# Patient Record
Sex: Male | Born: 1940 | Race: White | Hispanic: No | Marital: Married | Smoking: Never smoker
Health system: Southern US, Community
[De-identification: ages and names within clinical notes are randomized; demographics above are authoritative.]

## PROBLEM LIST (undated history)

## (undated) DIAGNOSIS — N400 Enlarged prostate without lower urinary tract symptoms: Secondary | ICD-10-CM

## (undated) DIAGNOSIS — R739 Hyperglycemia, unspecified: Secondary | ICD-10-CM

## (undated) DIAGNOSIS — K7290 Hepatic failure, unspecified without coma: Secondary | ICD-10-CM

## (undated) DIAGNOSIS — D126 Benign neoplasm of colon, unspecified: Secondary | ICD-10-CM

## (undated) DIAGNOSIS — I219 Acute myocardial infarction, unspecified: Secondary | ICD-10-CM

## (undated) DIAGNOSIS — I2510 Atherosclerotic heart disease of native coronary artery without angina pectoris: Secondary | ICD-10-CM

## (undated) DIAGNOSIS — E7800 Pure hypercholesterolemia, unspecified: Secondary | ICD-10-CM

## (undated) DIAGNOSIS — K7460 Unspecified cirrhosis of liver: Secondary | ICD-10-CM

## (undated) DIAGNOSIS — K7682 Hepatic encephalopathy: Secondary | ICD-10-CM

## (undated) DIAGNOSIS — I10 Essential (primary) hypertension: Secondary | ICD-10-CM

## (undated) DIAGNOSIS — K648 Other hemorrhoids: Secondary | ICD-10-CM

## (undated) DIAGNOSIS — S8290XA Unspecified fracture of unspecified lower leg, initial encounter for closed fracture: Secondary | ICD-10-CM

## (undated) DIAGNOSIS — C9000 Multiple myeloma not having achieved remission: Secondary | ICD-10-CM

## (undated) DIAGNOSIS — E119 Type 2 diabetes mellitus without complications: Secondary | ICD-10-CM

## (undated) DIAGNOSIS — I8500 Esophageal varices without bleeding: Secondary | ICD-10-CM

## (undated) DIAGNOSIS — S42009A Fracture of unspecified part of unspecified clavicle, initial encounter for closed fracture: Secondary | ICD-10-CM

## (undated) DIAGNOSIS — D649 Anemia, unspecified: Secondary | ICD-10-CM

## (undated) HISTORY — DX: Esophageal varices without bleeding: I85.00

## (undated) HISTORY — DX: Acute myocardial infarction, unspecified: I21.9

## (undated) HISTORY — DX: Pure hypercholesterolemia, unspecified: E78.00

## (undated) HISTORY — DX: Multiple myeloma not having achieved remission: C90.00

## (undated) HISTORY — DX: Atherosclerotic heart disease of native coronary artery without angina pectoris: I25.10

## (undated) HISTORY — DX: Essential (primary) hypertension: I10

## (undated) HISTORY — DX: Hepatic encephalopathy: K76.82

## (undated) HISTORY — DX: Fracture of unspecified part of unspecified clavicle, initial encounter for closed fracture: S42.009A

## (undated) HISTORY — DX: Type 2 diabetes mellitus without complications: E11.9

## (undated) HISTORY — DX: Unspecified fracture of unspecified lower leg, initial encounter for closed fracture: S82.90XA

## (undated) HISTORY — DX: Hepatic failure, unspecified without coma: K72.90

## (undated) HISTORY — DX: Anemia, unspecified: D64.9

## (undated) HISTORY — DX: Other hemorrhoids: K64.8

## (undated) HISTORY — DX: Benign neoplasm of colon, unspecified: D12.6

## (undated) HISTORY — PX: CORONARY ANGIOPLASTY WITH STENT PLACEMENT: SHX49

## (undated) HISTORY — PX: TONSILLECTOMY: SUR1361

## (undated) HISTORY — DX: Benign prostatic hyperplasia without lower urinary tract symptoms: N40.0

---

## 2005-03-17 LAB — HM COLONOSCOPY

## 2005-03-21 ENCOUNTER — Ambulatory Visit: Payer: Self-pay | Admitting: Gastroenterology

## 2005-08-16 ENCOUNTER — Inpatient Hospital Stay: Payer: Self-pay | Admitting: Internal Medicine

## 2005-08-16 ENCOUNTER — Other Ambulatory Visit: Payer: Self-pay

## 2005-08-17 ENCOUNTER — Other Ambulatory Visit: Payer: Self-pay

## 2006-05-01 ENCOUNTER — Emergency Department: Payer: Self-pay | Admitting: Emergency Medicine

## 2006-05-01 ENCOUNTER — Other Ambulatory Visit: Payer: Self-pay

## 2006-07-09 ENCOUNTER — Ambulatory Visit: Payer: Self-pay | Admitting: Internal Medicine

## 2010-09-08 ENCOUNTER — Observation Stay: Payer: Self-pay | Admitting: Internal Medicine

## 2011-07-11 LAB — IFOBT (OCCULT BLOOD): IFOBT: NEGATIVE

## 2011-12-11 ENCOUNTER — Telehealth: Payer: Self-pay | Admitting: Internal Medicine

## 2011-12-11 NOTE — Telephone Encounter (Signed)
Pt is needing refill on Diabetes meds. He uses [facility].

## 2011-12-12 ENCOUNTER — Telehealth: Payer: Self-pay | Admitting: Internal Medicine

## 2011-12-12 NOTE — Telephone Encounter (Signed)
Need to know what meds he needs.  He has not been seen here yet, but he does have an upcoming appt.  Ok to refill until appt, but need to know what he is taking.  Thanks

## 2011-12-12 NOTE — Telephone Encounter (Signed)
Patient wanting a refill on his Metformin. I don't see this drug on his medication list that was faxed from [place].

## 2011-12-12 NOTE — Telephone Encounter (Signed)
Called pt at home and left message with to returned call

## 2011-12-12 NOTE — Telephone Encounter (Signed)
Spoke to patients spouse who stated that he takes metformin. Pt had list of meds sent over from [facility], however there is no evidence of him taking metformin.

## 2011-12-13 ENCOUNTER — Telehealth: Payer: Self-pay | Admitting: *Deleted

## 2011-12-13 NOTE — Telephone Encounter (Signed)
I called and left a message for pt to call.  I called [facility].  The last time he got metformin filled at [facility] was 2010.  Need to know if he has been taking regularly.  If not, when did he start back taking.  If taking, where and who is filling.  Just need to clarify a few things before filling.  Thanks.

## 2011-12-13 NOTE — Telephone Encounter (Signed)
Spoke to patients wife who stated that Zachary Buck has not had a perscription since 2010 when you saw him last at [facility]

## 2011-12-13 NOTE — Telephone Encounter (Signed)
Patient returned call, he stated that he does not know what mg rx he takes. Patient stated that his will call us and let us know.

## 2011-12-15 ENCOUNTER — Other Ambulatory Visit: Payer: Self-pay | Admitting: Internal Medicine

## 2011-12-15 DIAGNOSIS — E119 Type 2 diabetes mellitus without complications: Secondary | ICD-10-CM

## 2011-12-15 DIAGNOSIS — E7800 Pure hypercholesterolemia, unspecified: Secondary | ICD-10-CM

## 2011-12-20 ENCOUNTER — Other Ambulatory Visit: Payer: Self-pay | Admitting: Internal Medicine

## 2011-12-20 ENCOUNTER — Other Ambulatory Visit (INDEPENDENT_AMBULATORY_CARE_PROVIDER_SITE_OTHER): Payer: No Typology Code available for payment source

## 2011-12-20 ENCOUNTER — Ambulatory Visit (INDEPENDENT_AMBULATORY_CARE_PROVIDER_SITE_OTHER): Payer: No Typology Code available for payment source | Admitting: Internal Medicine

## 2011-12-20 DIAGNOSIS — E7800 Pure hypercholesterolemia, unspecified: Secondary | ICD-10-CM

## 2011-12-20 DIAGNOSIS — Z23 Encounter for immunization: Secondary | ICD-10-CM

## 2011-12-20 DIAGNOSIS — N289 Disorder of kidney and ureter, unspecified: Secondary | ICD-10-CM

## 2011-12-20 DIAGNOSIS — E119 Type 2 diabetes mellitus without complications: Secondary | ICD-10-CM

## 2011-12-20 LAB — BASIC METABOLIC PANEL
BUN: 27 mg/dL — ABNORMAL HIGH (ref 6–23)
Chloride: 105 mEq/L (ref 96–112)
Creatinine, Ser: 1.5 mg/dL (ref 0.4–1.5)
GFR: 49.68 mL/min — ABNORMAL LOW (ref >60.00)
Glucose, Bld: 162 mg/dL — ABNORMAL HIGH (ref 70–99)
Potassium: 4.3 mEq/L (ref 3.5–5.1)

## 2011-12-20 LAB — HEPATIC FUNCTION PANEL
Alkaline Phosphatase: 37 U/L — ABNORMAL LOW (ref 39–117)
Bilirubin, Direct: 0 mg/dL (ref 0.0–0.3)
Total Bilirubin: 0.8 mg/dL (ref 0.3–1.2)
Total Protein: 7.7 g/dL (ref 6.0–8.3)

## 2011-12-20 LAB — LIPID PANEL
HDL: 26.8 mg/dL — ABNORMAL LOW (ref >39.00)
VLDL: 45 mg/dL — ABNORMAL HIGH (ref 0.0–40.0)

## 2011-12-20 LAB — HEMOGLOBIN A1C: Hgb A1c MFr Bld: 6.9 % — ABNORMAL HIGH (ref 4.6–6.5)

## 2011-12-20 NOTE — Telephone Encounter (Signed)
Dr. Lorin Picket got in touch with patient.

## 2011-12-22 ENCOUNTER — Telehealth: Payer: Self-pay | Admitting: *Deleted

## 2011-12-22 NOTE — Telephone Encounter (Signed)
Left message for patient to return call.

## 2011-12-25 ENCOUNTER — Encounter: Payer: Self-pay | Admitting: *Deleted

## 2011-12-25 ENCOUNTER — Telehealth: Payer: Self-pay | Admitting: *Deleted

## 2011-12-25 NOTE — Telephone Encounter (Signed)
Since his level was ok (just slightly increased) - I am going to hold off on restarting Metformin at this time.  Will need to follow sugars.

## 2011-12-25 NOTE — Telephone Encounter (Signed)
Patients wife found his old med bottle for met and she said he was on 500 mg. She wanted to know if you were going to start him on this med again?

## 2011-12-25 NOTE — Progress Notes (Signed)
Mailed lab results to patient per his request.

## 2011-12-26 NOTE — Telephone Encounter (Signed)
Spoke with patients wife again and informed her to let her husband know that he is to keep a check on his sugar. Patient is to see Dr Lorin Picket 03/20/12.

## 2011-12-26 NOTE — Telephone Encounter (Signed)
Left message for patient to return call.

## 2011-12-27 NOTE — Telephone Encounter (Signed)
Already spoken to patient, who called back.

## 2012-03-19 ENCOUNTER — Encounter: Payer: Self-pay | Admitting: *Deleted

## 2012-03-20 ENCOUNTER — Ambulatory Visit: Payer: Self-pay | Admitting: Internal Medicine

## 2012-05-21 DIAGNOSIS — D649 Anemia, unspecified: Secondary | ICD-10-CM | POA: Insufficient documentation

## 2012-05-21 DIAGNOSIS — E785 Hyperlipidemia, unspecified: Secondary | ICD-10-CM

## 2012-05-21 DIAGNOSIS — I10 Essential (primary) hypertension: Secondary | ICD-10-CM | POA: Insufficient documentation

## 2012-05-21 DIAGNOSIS — I495 Sick sinus syndrome: Secondary | ICD-10-CM

## 2012-05-21 DIAGNOSIS — E7800 Pure hypercholesterolemia, unspecified: Secondary | ICD-10-CM | POA: Insufficient documentation

## 2012-05-21 DIAGNOSIS — Z86010 Personal history of colonic polyps: Secondary | ICD-10-CM | POA: Insufficient documentation

## 2012-05-21 DIAGNOSIS — I2510 Atherosclerotic heart disease of native coronary artery without angina pectoris: Secondary | ICD-10-CM | POA: Insufficient documentation

## 2012-05-29 ENCOUNTER — Encounter: Payer: Self-pay | Admitting: Internal Medicine

## 2012-05-29 ENCOUNTER — Ambulatory Visit (INDEPENDENT_AMBULATORY_CARE_PROVIDER_SITE_OTHER): Payer: Medicare Other | Admitting: Internal Medicine

## 2012-05-29 VITALS — BP 130/72 | HR 68 | Temp 98.0°F | Resp 18 | Ht 69.0 in | Wt 207.5 lb

## 2012-05-29 DIAGNOSIS — E119 Type 2 diabetes mellitus without complications: Secondary | ICD-10-CM | POA: Insufficient documentation

## 2012-05-29 DIAGNOSIS — E7800 Pure hypercholesterolemia, unspecified: Secondary | ICD-10-CM

## 2012-05-29 DIAGNOSIS — I10 Essential (primary) hypertension: Secondary | ICD-10-CM

## 2012-05-29 DIAGNOSIS — Z860100 Personal history of colon polyps, unspecified: Secondary | ICD-10-CM

## 2012-05-29 DIAGNOSIS — I2510 Atherosclerotic heart disease of native coronary artery without angina pectoris: Secondary | ICD-10-CM

## 2012-05-29 DIAGNOSIS — D649 Anemia, unspecified: Secondary | ICD-10-CM

## 2012-05-29 LAB — BASIC METABOLIC PANEL
CO2: 24 mEq/L (ref 19–32)
Calcium: 9.3 mg/dL (ref 8.4–10.5)
Creatinine, Ser: 1.1 mg/dL (ref 0.4–1.5)
Glucose, Bld: 160 mg/dL — ABNORMAL HIGH (ref 70–99)

## 2012-05-29 LAB — HEPATIC FUNCTION PANEL
ALT: 21 U/L (ref 0–53)
AST: 21 U/L (ref 0–37)
Alkaline Phosphatase: 38 U/L — ABNORMAL LOW (ref 39–117)
Bilirubin, Direct: 0.1 mg/dL (ref 0.0–0.3)
Total Bilirubin: 0.7 mg/dL (ref 0.3–1.2)

## 2012-05-29 LAB — LIPID PANEL: Triglycerides: 323 mg/dL — ABNORMAL HIGH (ref 0.0–149.0)

## 2012-05-29 LAB — CBC WITH DIFFERENTIAL/PLATELET
Basophils Absolute: 0.1 10*3/uL (ref 0.0–0.1)
Eosinophils Relative: 3.5 % (ref 0.0–5.0)
HCT: 41.4 % (ref 39.0–52.0)
Lymphocytes Relative: 26.5 % (ref 12.0–46.0)
Monocytes Relative: 9.4 % (ref 3.0–12.0)
Neutrophils Relative %: 59.8 % (ref 43.0–77.0)
Platelets: 263 10*3/uL (ref 150.0–400.0)
RDW: 13.9 % (ref 11.5–14.6)
WBC: 6.6 10*3/uL (ref 4.5–10.5)

## 2012-05-29 LAB — FERRITIN: Ferritin: 62.4 ng/mL (ref 22.0–322.0)

## 2012-05-29 LAB — LDL CHOLESTEROL, DIRECT: Direct LDL: 95.3 mg/dL

## 2012-06-02 ENCOUNTER — Encounter: Payer: Self-pay | Admitting: Internal Medicine

## 2012-06-02 NOTE — Assessment & Plan Note (Signed)
Blood pressure is doing well.  Same med regimen. Follow.  Check metabolic panel.

## 2012-06-02 NOTE — Assessment & Plan Note (Signed)
Not checking sugars.  Stressed to him the importance of eating regular meals, monitoring his diet, exercise and checking sugars.  Check metabolic panel and a1c.  Follow.

## 2012-06-02 NOTE — Assessment & Plan Note (Signed)
On previous check hgb slightly decreased.  Iron studies wnl.  Follow.    

## 2012-06-02 NOTE — Progress Notes (Signed)
Subjective:    Patient ID: Zachary Buck, male    DOB: [birth year], 72 y.o.   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  He comes in today to follow up on these issues and to transfer his care to [facility].  I previously saw him at [place].  States he is doing well.  Not checking his sugars. Tries to stay active.  Not doing regular exercise.  No chest pain or tightness with increased activity or exertion.  Breathing stable.  Bowels stable.    Past Medical History  Diagnosis Date  . Hypercholesterolemia   . Atherosclerotic heart disease     s/p MI, s/p stent mid circumflex  . Hypertension   . Diabetes mellitus without complication   . Clavicle fracture   . Leg fracture     Current Outpatient Prescriptions on File Prior to Visit  Medication Sig Dispense Refill  . aspirin 81 MG tablet Take 81 mg by mouth daily.      . Cholecalciferol (VITAMIN D-3) 1000 UNITS CAPS Take by mouth.      . Coenzyme Q10 (COQ-10) 100 MG CAPS Take by mouth daily.      Marland Kitchen glucose blood test strip 1 each by Other route as needed. Use as instructed      . levocarnitine (CARNITOR) 250 MG capsule Take 250 mg by mouth daily.      Marland Kitchen lisinopril (PRINIVIL,ZESTRIL) 20 MG tablet Take 20 mg by mouth daily.      . Omega-3 Fatty Acids (FISH OIL) 1000 MG CAPS Take 3 capsules by mouth daily.      . rosuvastatin (CRESTOR) 20 MG tablet Take 20 mg by mouth daily.      . vitamin E (VITAMIN E) 400 UNIT capsule Take 400 Units by mouth daily.      . vitamin k 100 MCG tablet Take 100 mcg by mouth daily.       No current facility-administered medications on file prior to visit.    Review of Systems Patient denies any headache, lightheadedness or dizziness. No sinus or allergy symptoms.   No chest pain, tightness or palpitations.  No increased shortness of breath, cough or congestion.  No acid reflux.  No nausea or  vomiting.  No abdominal pain or cramping.  No bowel change, such as diarrhea, constipation, BRBPR or melana.  No urine change.  Not checking his sugars.  Not watching what he eats.  Feels good.       Objective:   Physical Exam Filed Vitals:   05/29/12 1028  BP: 130/72  Pulse: 68  Temp: 98 F (36.7 C)  Resp: 78   72 year old male in no acute distress.  HEENT:  Nares - clear.  Oropharynx - without lesions. NECK:  Supple.  Nontender.  No audible carotid bruit.  HEART:  Appears to be regular.   LUNGS:  No crackles or wheezing audible.  Respirations even and unlabored.   RADIAL PULSE:  Equal bilaterally.  ABDOMEN:  Soft.  Nontender.  Bowel sounds present and normal.  No audible abdominal bruit.    EXTREMITIES:  No increased edema present.  DP pulses palpable and equal bilaterally.         Assessment & Plan:  GI.  Has had no further bleeding.  Was referred to GI.  Need to obtain records if evaluated.   Follow.   HEALTH MAINTENANCE.  Schedule  a physical for next visit.  Prostate checks and PSAs through Dr Achilles Dunk.  GI as outlined.

## 2012-06-02 NOTE — Assessment & Plan Note (Signed)
Currently doing well.  Sees Dr Gwen Pounds.  Discussed need for aggressive risk factor modification.

## 2012-06-02 NOTE — Assessment & Plan Note (Signed)
On Crestor.  Low cholesterol diet and exercise.  Check lipid panel and liver function.   

## 2012-06-02 NOTE — Assessment & Plan Note (Signed)
Last colonoscopy that I have results - 2007.  Hyperplastic polyp.  Obtain more recent records.  No further bleeding.

## 2012-06-03 ENCOUNTER — Telehealth: Payer: Self-pay | Admitting: Internal Medicine

## 2012-06-03 ENCOUNTER — Encounter: Payer: Self-pay | Admitting: Internal Medicine

## 2012-06-03 NOTE — Telephone Encounter (Signed)
Please see pts my chart message.  I don't understand the message.  What does he need a prescription for.  I had left a lab result note - regarding him needing to come in to discuss treatment for his sugars.  His a1c - elevated.

## 2012-06-04 MED ORDER — METFORMIN HCL 500 MG PO TABS: 500.0000 mg | ORAL_TABLET | Freq: Every day | ORAL | Status: DC

## 2012-06-04 NOTE — Telephone Encounter (Signed)
Pt wanted you to know that he was not taking any medication for sugar prior to his labs and that is why he wants the medication Metformin. He states he feels like that is the reason why his sugar was elevated .He does not want a office visit because he feels like the [employer] will not pay for all these visits.

## 2012-06-04 NOTE — Telephone Encounter (Signed)
Pt notified that his Metformin 500mg  q day has been sent to [facility] pharmacy. And that he should start taking and checking and record sugars at least q day. Take some fasting in the morning and some 2hours after supper and some before bed.

## 2012-06-04 NOTE — Telephone Encounter (Signed)
I sent a prescription in to [facility] for metformin 500mg  q day.  I want Zachary Buck to start taking and check and record sugars at least q day.  (can vary the tiimes taking).  Take some fasting in the morning and some 2 hours after supper and some before bed.  Record times.  I do want to see Zachary Buck in 6 weeks to evaluate sugars and see if on correct dose.

## 2012-06-04 NOTE — Telephone Encounter (Signed)
Noted.  Needs a 6 week follow up appt with me to review his blood sugars.  Thanks.

## 2012-06-04 NOTE — Telephone Encounter (Signed)
Call pt cell number [phone]

## 2012-06-05 ENCOUNTER — Encounter: Payer: Self-pay | Admitting: Internal Medicine

## 2012-06-05 NOTE — Telephone Encounter (Signed)
Left message asking pt to call office please schedule appointment °

## 2012-06-06 ENCOUNTER — Telehealth: Payer: Self-pay | Admitting: Internal Medicine

## 2012-06-06 NOTE — Telephone Encounter (Signed)
My chart response.  Pt informed to monitor sugars and record.  Send in.  Make f/u appt.

## 2012-06-10 NOTE — Telephone Encounter (Signed)
Noted.  So is he planning to keep the 09/18/12 appt.  If so, ok.  Let me know if I need to do anything.  Thanks.

## 2012-06-10 NOTE — Telephone Encounter (Signed)
Called patient.  Pt wanted a wed @ 8.  I offered him 6/2 @ 8:15.  He insisted on a wed at 8  Offered 7/30 @ 8.  Pt stated he was taking his metformin and is doing what he is supposed to do.  He stated he wanted to wait until aug to discuss this.

## 2012-06-10 NOTE — Telephone Encounter (Signed)
His cpx on 8/20

## 2012-07-02 ENCOUNTER — Encounter: Payer: Self-pay | Admitting: Internal Medicine

## 2012-07-08 ENCOUNTER — Telehealth: Payer: Self-pay | Admitting: Internal Medicine

## 2012-07-08 ENCOUNTER — Telehealth: Payer: Self-pay | Admitting: *Deleted

## 2012-07-08 NOTE — Telephone Encounter (Signed)
He will need to f/u with Dr cope regarding the stone.  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  As I put in my Email to him on 5/14 - he will need a f/u with me regarding the cyst.  Needs to address the kidney stone first.  This is a more acute issue.

## 2012-07-08 NOTE — Telephone Encounter (Signed)
Pt states that he has been emailing back & forth with you about his kidney stone. Was seen in ER in [place] last week, and needs to know TODAY what to do next? He wants to know if he should go see Dr. Achilles Dunk or come and see you first. Please advise

## 2012-07-08 NOTE — Telephone Encounter (Signed)
(  Duplicate)......Marland KitchenMarland KitchenSpoke with pt around 8:45am this morning prior to him calling again. See prior message

## 2012-07-08 NOTE — Telephone Encounter (Signed)
Caller: Matheo/Patient; Phone: [phone]; Reason for Call: Pt states he has left numerous message concerning the 8mm kidney stone.  Pt would like to know if he need to she provider before referral to Urology.  PLEASE F/U WITH THE PT ASAP, THANK YOU.

## 2012-07-08 NOTE — Telephone Encounter (Signed)
I spoke with pt regarding both matters (kidney stone & cyst). I also reminded pt that we have been communicating with him through email. Dr. Lorin Picket responded on 5/14 & didn't reply until Saturday 5/17. Also made pt aware that Dr. Lorin Picket can not see emails until Monday. Pt advised to see Dr. Achilles Dunk first for his acute kidney stone & take his records from the [place]. Also asked pt to have Dr Achilles Dunk send [place] records after his visit. Pt verbalized understanding

## 2012-07-09 ENCOUNTER — Telehealth: Payer: Self-pay | Admitting: Internal Medicine

## 2012-07-09 NOTE — Telephone Encounter (Signed)
Spoke with pt an advised him yesterday to see Dr. Achilles Dunk first, then f/u with Dr Lorin Picket in regards to his cyst. See prior messages

## 2012-07-09 NOTE — Telephone Encounter (Signed)
I think this has already been addressed.  I had sent message stating that he did need to f/u with Dr Achilles Dunk regarding the kidney stone and me for the liver cyst.  Can schedule an appt with me.  Let me know if I need to do anything more or if he needs anything more.

## 2012-07-11 ENCOUNTER — Ambulatory Visit: Payer: Self-pay | Admitting: Urology

## 2012-07-11 DIAGNOSIS — N200 Calculus of kidney: Secondary | ICD-10-CM | POA: Insufficient documentation

## 2012-07-11 DIAGNOSIS — N201 Calculus of ureter: Secondary | ICD-10-CM | POA: Insufficient documentation

## 2012-07-11 DIAGNOSIS — N1330 Unspecified hydronephrosis: Secondary | ICD-10-CM | POA: Insufficient documentation

## 2012-07-11 DIAGNOSIS — N23 Unspecified renal colic: Secondary | ICD-10-CM | POA: Insufficient documentation

## 2012-07-17 ENCOUNTER — Encounter: Payer: Self-pay | Admitting: *Deleted

## 2012-07-22 ENCOUNTER — Ambulatory Visit (INDEPENDENT_AMBULATORY_CARE_PROVIDER_SITE_OTHER): Payer: Medicare Other | Admitting: Internal Medicine

## 2012-07-22 ENCOUNTER — Encounter: Payer: Self-pay | Admitting: Internal Medicine

## 2012-07-22 VITALS — BP 130/60 | HR 51 | Temp 98.5°F | Ht 69.0 in | Wt 210.8 lb

## 2012-07-22 DIAGNOSIS — I2510 Atherosclerotic heart disease of native coronary artery without angina pectoris: Secondary | ICD-10-CM

## 2012-07-22 DIAGNOSIS — E119 Type 2 diabetes mellitus without complications: Secondary | ICD-10-CM

## 2012-07-22 NOTE — Progress Notes (Signed)
Subjective:    Patient ID: Zachary Buck, male    DOB: 1940-05-21, 72 y.o.   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  He comes in today as a work in with concerns regarding increased pain in his left arm and some numbness in his hands - mostly the left hand.  He first noticed some symptoms a couple of weeks ago, but over the last couple of days has worsened.  Hurts to squeeze his hand.  Was unable to make a fist this am - because of the pain.  When he squeezes his hand - pain goes up his left forearm.  No neck pain.  No shoulder pain.  No injury or trauma.  He is unable to sleep secondary to the pain.  He also had a recent kidney stone.  Is s/p lithotripsy.  Passed two pieces of the stone.  Due to see Dr Achilles Dunk tomorrow. No abdominal pain now.     Past Medical History  Diagnosis Date  . Hypercholesterolemia   . Atherosclerotic heart disease     s/p MI, s/p stent mid circumflex  . Hypertension   . Diabetes mellitus without complication   . Clavicle fracture   . Leg fracture     Current Outpatient Prescriptions on File Prior to Visit  Medication Sig Dispense Refill  . aspirin 81 MG tablet Take 81 mg by mouth daily.      . Cholecalciferol (VITAMIN D-3) 1000 UNITS CAPS Take by mouth.      . Coenzyme Q10 (COQ-10) 100 MG CAPS Take by mouth daily.      Marland Kitchen glucose blood test strip 1 each by Other route as needed. Use as instructed      . levocarnitine (CARNITOR) 250 MG capsule Take 250 mg by mouth daily.      Marland Kitchen lisinopril (PRINIVIL,ZESTRIL) 20 MG tablet Take 20 mg by mouth daily.      . metFORMIN (GLUCOPHAGE) 500 MG tablet Take 1 tablet (500 mg total) by mouth daily.  30 tablet  2  . Omega-3 Fatty Acids (FISH OIL) 1000 MG CAPS Take 3 capsules by mouth daily.      . rosuvastatin (CRESTOR) 20 MG tablet Take 20 mg by mouth daily.      . vitamin E (VITAMIN E) 400 UNIT  capsule Take 400 Units by mouth daily.      . vitamin k 100 MCG tablet Take 100 mcg by mouth daily.       No current facility-administered medications on file prior to visit.    Review of Systems Patient denies any headache.   No chest pain, tightness or palpitations.  No increased shortness of breath, cough or congestion.  No acid reflux.  No nausea or vomiting.  No abdominal pain or cramping. No bowel change.  Urinating.  Arm pain as outlined.  Hand numbness as outlined - left worse.  Increased pain with making a fist.        Objective:   Physical Exam  Filed Vitals:   07/22/12 1457  BP: 130/60  Pulse: 51  Temp: 98.5 F (22.26 C)   72 year old male in no acute distress. NECK:  Supple.  Nontender.  No audible carotid bruit.  Good rom.  HEART:  Appears to be regular.   LUNGS:  No crackles or wheezing audible.  Respirations even and unlabored.   RADIAL  PULSE:  Equal bilaterally.  EXTREMITIES.  No increased swelling or redness.  Increased pain with gripping.  Grip strength slightly decreased on the left when compared to the right.  Increased pain to palpation over the forearm.  Good rom in the shoulder and neck.          Assessment & Plan:  MSK.  Pain and exam as outlined.  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  Would place in a cock up wrist splint.  Medrol dose pack - 6 day taper.  Monitor sugars.  Wanted to obtain EMGs.  He declined.  Will notify me if he changes his mind or if his symptoms do not resolve.    HEALTH MAINTENANCE.  Scheduled a physical for next visit.  Prostate checks and PSAs through Dr Achilles Dunk.  GI as outlined.

## 2012-07-22 NOTE — Assessment & Plan Note (Signed)
He reports his sugars are doing better.  On metformin.  Instructed him on the possible side effects of the steroids.  Monitor sugars.  Stay hydrated.  Follow.

## 2012-07-22 NOTE — Assessment & Plan Note (Signed)
No chest pain or tightness with increased activity or exertion.  Follow.

## 2012-07-23 ENCOUNTER — Ambulatory Visit: Payer: Self-pay | Admitting: Urology

## 2012-07-23 DIAGNOSIS — N138 Other obstructive and reflux uropathy: Secondary | ICD-10-CM | POA: Insufficient documentation

## 2012-07-31 ENCOUNTER — Encounter: Payer: Self-pay | Admitting: Internal Medicine

## 2012-08-13 ENCOUNTER — Ambulatory Visit (INDEPENDENT_AMBULATORY_CARE_PROVIDER_SITE_OTHER): Payer: Medicare Other | Admitting: Internal Medicine

## 2012-08-13 ENCOUNTER — Encounter: Payer: Self-pay | Admitting: Internal Medicine

## 2012-08-13 VITALS — BP 140/80 | HR 64 | Temp 98.0°F | Ht 69.0 in | Wt 205.5 lb

## 2012-08-13 DIAGNOSIS — E119 Type 2 diabetes mellitus without complications: Secondary | ICD-10-CM

## 2012-08-13 DIAGNOSIS — I2510 Atherosclerotic heart disease of native coronary artery without angina pectoris: Secondary | ICD-10-CM

## 2012-08-14 ENCOUNTER — Ambulatory Visit: Payer: Self-pay | Admitting: Rheumatology

## 2012-08-19 ENCOUNTER — Encounter: Payer: Self-pay | Admitting: Internal Medicine

## 2012-08-19 NOTE — Assessment & Plan Note (Signed)
No chest pain or tightness with increased activity or exertion.  Follow.

## 2012-08-19 NOTE — Progress Notes (Signed)
Subjective:    Patient ID: Zachary Buck, male    DOB: 1940-06-07, 72 y.o.   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  He comes in today as a work in with concerns regarding increased arm pain and numbness.  Started having pain prior to the last visit.  See last note for details.  Was given a prednisone taper last visit.   Having persistent arm pain.  Left arm is worse.  Some right hand discomfort as well.  No pain with movement.  No injury or trauma.  He is unable to sleep secondary to the pain.  Lying down is worse.  Feels better with standing.  No leg or foot pain or numbness.  No neck pain.     Past Medical History  Diagnosis Date  . Hypercholesterolemia   . Atherosclerotic heart disease     s/p MI, s/p stent mid circumflex  . Hypertension   . Diabetes mellitus without complication   . Clavicle fracture   . Leg fracture     Current Outpatient Prescriptions on File Prior to Visit  Medication Sig Dispense Refill  . aspirin 81 MG tablet Take 81 mg by mouth daily.      . Cholecalciferol (VITAMIN D-3) 1000 UNITS CAPS Take by mouth.      . Coenzyme Q10 (COQ-10) 100 MG CAPS Take by mouth daily.      Marland Kitchen glucose blood test strip 1 each by Other route as needed. Use as instructed      . levocarnitine (CARNITOR) 250 MG capsule Take 250 mg by mouth daily.      Marland Kitchen lisinopril (PRINIVIL,ZESTRIL) 20 MG tablet Take 20 mg by mouth daily.      . metFORMIN (GLUCOPHAGE) 500 MG tablet Take 1 tablet (500 mg total) by mouth daily.  30 tablet  2  . Omega-3 Fatty Acids (FISH OIL) 1000 MG CAPS Take 3 capsules by mouth daily.      . rosuvastatin (CRESTOR) 20 MG tablet Take 20 mg by mouth daily.      . vitamin E (VITAMIN E) 400 UNIT capsule Take 400 Units by mouth daily.      . vitamin k 100 MCG tablet Take 100 mcg by mouth daily.       No current facility-administered medications on  file prior to visit.    Review of Systems Patient denies any headache.   No chest pain, tightness or palpitations.  No increased shortness of breath, cough or congestion.  No acid reflux.  No nausea or vomiting.  No abdominal pain or cramping. No bowel change.  Urinating.  Arm pain as outlined.  Arm and hand numbness as outlined - left worse. Standing better.  No foot or leg pain.  No neck pain.         Objective:   Physical Exam  Filed Vitals:   08/13/12 0823  BP: 140/80  Pulse: 64  Temp: 98 F (65.53 C)   72 year old male in no acute distress. NECK:  Supple.  Nontender.  No audible carotid bruit.  Good rom.  HEART:  Appears to be regular.   LUNGS:  No crackles or wheezing audible.  Respirations even and unlabored.   RADIAL PULSE:  Equal bilaterally.  EXTREMITIES.  No increased swelling or redness.  Increased pain with gripping.  Grip strength slightly decreased on the left when compared  to the right.  Increased pain to palpation over the forearm.  Good rom in the shoulder and neck.  Increased fullness right upper extremity.  Non tender.         Assessment & Plan:  MSK.  Pain and exam as outlined.  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  He is using a splint without relief.  Prednisone may have taken the edge off.  No significant relief.  Persistent pain and numbness.  Arm fullness as outlined.  Discussed with Dr Gavin Potters.  Agreed to see the pt today.  Further w/up pending his assessment.  Had previously declined nerve conduction studies.    HEALTH MAINTENANCE.  Scheduled a physical for next visit.  Prostate checks and PSAs through Dr Achilles Dunk.  GI as outlined.

## 2012-08-19 NOTE — Assessment & Plan Note (Signed)
He has reported his sugars are doing better.  On metformin.  Instructed him on the possible side effects of the steroids.  Monitor sugars.  Stay hydrated.  Follow.

## 2012-08-30 ENCOUNTER — Ambulatory Visit: Payer: Self-pay | Admitting: Orthopedic Surgery

## 2012-08-30 LAB — CREATININE, SERUM
Creatinine: 1.43 mg/dL — ABNORMAL HIGH (ref 0.60–1.30)
EGFR (African American): 56 — ABNORMAL LOW
EGFR (Non-African Amer.): 49 — ABNORMAL LOW

## 2012-09-24 DIAGNOSIS — D481 Neoplasm of uncertain behavior of connective and other soft tissue: Secondary | ICD-10-CM | POA: Insufficient documentation

## 2012-09-25 ENCOUNTER — Encounter: Payer: Self-pay | Admitting: *Deleted

## 2012-09-25 ENCOUNTER — Other Ambulatory Visit: Payer: Self-pay

## 2012-10-07 ENCOUNTER — Other Ambulatory Visit: Payer: Self-pay | Admitting: Internal Medicine

## 2012-10-07 NOTE — Telephone Encounter (Signed)
Mailed unread message to pt  

## 2012-10-09 ENCOUNTER — Encounter: Payer: Self-pay | Admitting: Internal Medicine

## 2012-10-09 ENCOUNTER — Ambulatory Visit (INDEPENDENT_AMBULATORY_CARE_PROVIDER_SITE_OTHER): Payer: Medicare Other | Admitting: Internal Medicine

## 2012-10-09 VITALS — BP 110/60 | HR 80 | Temp 98.1°F | Ht 69.25 in | Wt 209.2 lb

## 2012-10-09 DIAGNOSIS — Z860100 Personal history of colon polyps, unspecified: Secondary | ICD-10-CM

## 2012-10-09 DIAGNOSIS — M79609 Pain in unspecified limb: Secondary | ICD-10-CM

## 2012-10-09 DIAGNOSIS — M79601 Pain in right arm: Secondary | ICD-10-CM

## 2012-10-09 DIAGNOSIS — D649 Anemia, unspecified: Secondary | ICD-10-CM

## 2012-10-09 DIAGNOSIS — I2510 Atherosclerotic heart disease of native coronary artery without angina pectoris: Secondary | ICD-10-CM

## 2012-10-09 DIAGNOSIS — E119 Type 2 diabetes mellitus without complications: Secondary | ICD-10-CM

## 2012-10-09 DIAGNOSIS — I10 Essential (primary) hypertension: Secondary | ICD-10-CM

## 2012-10-09 DIAGNOSIS — E7800 Pure hypercholesterolemia, unspecified: Secondary | ICD-10-CM

## 2012-10-09 LAB — CBC WITH DIFFERENTIAL/PLATELET
Basophils Relative: 0.7 % (ref 0.0–3.0)
Eosinophils Absolute: 0.3 10*3/uL (ref 0.0–0.7)
Eosinophils Relative: 4.3 % (ref 0.0–5.0)
Lymphocytes Relative: 20.9 % (ref 12.0–46.0)
MCHC: 34.1 g/dL (ref 30.0–36.0)
Neutrophils Relative %: 63.9 % (ref 43.0–77.0)
RBC: 4.73 Mil/uL (ref 4.22–5.81)
WBC: 8.1 10*3/uL (ref 4.5–10.5)

## 2012-10-09 LAB — BASIC METABOLIC PANEL
GFR: 58.08 mL/min — ABNORMAL LOW (ref >60.00)
Potassium: 5 mEq/L (ref 3.5–5.1)
Sodium: 135 mEq/L (ref 135–145)

## 2012-10-09 LAB — LIPID PANEL
Cholesterol: 232 mg/dL — ABNORMAL HIGH (ref 0–200)
HDL: 34.3 mg/dL — ABNORMAL LOW (ref >39.00)
VLDL: 114.2 mg/dL — ABNORMAL HIGH (ref 0.0–40.0)

## 2012-10-09 LAB — HM DIABETES FOOT EXAM

## 2012-10-09 LAB — HEPATIC FUNCTION PANEL
Albumin: 3.8 g/dL (ref 3.5–5.2)
Total Protein: 7.6 g/dL (ref 6.0–8.3)

## 2012-10-09 LAB — LDL CHOLESTEROL, DIRECT: Direct LDL: 103.5 mg/dL

## 2012-10-09 MED ORDER — METFORMIN HCL 500 MG PO TABS: 500.0000 mg | ORAL_TABLET | Freq: Two times a day (BID) | ORAL | Status: DC

## 2012-10-10 ENCOUNTER — Encounter: Payer: Self-pay | Admitting: *Deleted

## 2012-10-10 ENCOUNTER — Telehealth: Payer: Self-pay | Admitting: *Deleted

## 2012-10-10 NOTE — Telephone Encounter (Signed)
Pt called regarding DM Chart Review. Chart updated, declined pneumovax. Foot exam done per Dr. Lorin Picket at yesterday's cpe.

## 2012-10-11 ENCOUNTER — Encounter: Payer: Self-pay | Admitting: Internal Medicine

## 2012-10-11 DIAGNOSIS — M79601 Pain in right arm: Secondary | ICD-10-CM | POA: Insufficient documentation

## 2012-10-11 NOTE — Assessment & Plan Note (Signed)
On Crestor.  Low cholesterol diet and exercise.  Follow lipid panel and liver function.   

## 2012-10-11 NOTE — Assessment & Plan Note (Signed)
Hand and arm pain as outlined.  Has seen Dr Gavin Potters, [facility].  Had MRI of upper arm and neck.  Obtain records.  Waiting a call back from the cancer center.  Discussed gabapentin.  He declines.  Discussed obtaining nerve conduction studies.  He declines.  He desires no further w/up.  Follow.

## 2012-10-11 NOTE — Assessment & Plan Note (Signed)
Blood pressure is doing well.  Same med regimen. Follow.  Follow metabolic panel.   

## 2012-10-11 NOTE — Assessment & Plan Note (Signed)
No chest pain or tightness with increased activity or exertion.  Stable.  Follow.   

## 2012-10-11 NOTE — Progress Notes (Signed)
Subjective:    Patient ID: Zachary Buck, male    DOB: 1940/05/12, 72 y.o.   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  He comes in today to follow up on these issues as well as for a complete physical exam.  Not doing regular exercise.  No chest pain or tightness with increased activity or exertion.  Breathing stable.  Bowels stable.  Still having persistent pain in his hands and arms.  Saw Dr Gavin Potters.  Was given a rx for neurontin.  He refuses to take.  Saw ortho.  Had MRI of neck and upper arm.  Was referred to the cancer center (medical center).  States recommended follow up mri of upper arm in November.  No change in pain.  Still with increased discomfort.  Brought in no recorded sugar readings.  States am sugars are averaging 160 and PM sugars averaging 140.  He is walking.  Discussed need for diet and exercise.  Also discussed the need for recording sugars for our review.  Taking the metformin.     Past Medical History  Diagnosis Date  . Hypercholesterolemia   . Atherosclerotic heart disease     s/p MI, s/p stent mid circumflex  . Hypertension   . Diabetes mellitus without complication   . Clavicle fracture   . Leg fracture     Current Outpatient Prescriptions on File Prior to Visit  Medication Sig Dispense Refill  . aspirin 81 MG tablet Take 81 mg by mouth daily.      . Cholecalciferol (VITAMIN D-3) 1000 UNITS CAPS Take by mouth.      . Coenzyme Q10 (COQ-10) 100 MG CAPS Take by mouth daily.      Marland Kitchen glucose blood test strip 1 each by Other route as needed. Use as instructed      . levocarnitine (CARNITOR) 250 MG capsule Take 250 mg by mouth daily.      Marland Kitchen lisinopril (PRINIVIL,ZESTRIL) 20 MG tablet Take 20 mg by mouth daily.      . Omega-3 Fatty Acids (FISH OIL) 1000 MG CAPS Take 3 capsules by mouth daily.      . rosuvastatin (CRESTOR) 20 MG tablet Take 20 mg by  mouth daily.      . vitamin E (VITAMIN E) 400 UNIT capsule Take 400 Units by mouth daily.      . vitamin k 100 MCG tablet Take 100 mcg by mouth daily.       No current facility-administered medications on file prior to visit.    Review of Systems Patient denies any headache, lightheadedness or dizziness. No sinus or allergy symptoms.   No chest pain, tightness or palpitations.  No increased shortness of breath, cough or congestion.  No acid reflux.  No nausea or vomiting.  No abdominal pain or cramping.  No bowel change, such as diarrhea, constipation, BRBPR or melana.  No urine change.  Arm pain as outlined.  No recorded sugar readings.       Objective:   Physical Exam  Filed Vitals:   10/09/12 0829  BP: 110/60  Pulse: 80  Temp: 98.1 F (18.69 C)   72 year old male in no acute distress.  HEENT:  Nares - clear.  Oropharynx - without lesions. NECK:  Supple.  Nontender.  No audible carotid bruit.  HEART:  Appears to be regular.   LUNGS:  No crackles or wheezing  audible.  Respirations even and unlabored.   RADIAL PULSE:  Equal bilaterally.  ABDOMEN:  Soft.  Nontender.  Bowel sounds present and normal.  No audible abdominal bruit.  GU:  Performed by urology.    EXTREMITIES:  No increased edema present.  DP pulses palpable and equal bilaterally. FEET:  No lesions.       Assessment & Plan:  GI.  Has had no further bleeding.  Was referred to GI.    HEALTH MAINTENANCE.  Physical today.   Prostate checks and PSAs through Dr Achilles Dunk.  GI as outlined.

## 2012-10-11 NOTE — Assessment & Plan Note (Signed)
Sugars as outlined.  Discussed the importance of diet and exercise. Discussed the need to record sugars for review.  Increase metformin to 500mg  bid.  Check check met b and a1c.  Follow closely.  Have him send in readings over the next few weeks.  Discussed need for eye exam.

## 2012-10-11 NOTE — Assessment & Plan Note (Signed)
On previous check hgb slightly decreased.  Iron studies wnl.  Follow.    

## 2012-10-11 NOTE — Assessment & Plan Note (Signed)
Last colonoscopy that I have results - 2007.  Hyperplastic polyp.   No further bleeding.  Referred to GI.   

## 2012-10-13 ENCOUNTER — Encounter: Payer: Self-pay | Admitting: Internal Medicine

## 2012-10-15 ENCOUNTER — Encounter: Payer: Self-pay | Admitting: Internal Medicine

## 2012-10-28 ENCOUNTER — Encounter: Payer: Self-pay | Admitting: Internal Medicine

## 2012-11-06 ENCOUNTER — Encounter: Payer: Self-pay | Admitting: Internal Medicine

## 2012-12-26 ENCOUNTER — Other Ambulatory Visit: Payer: Self-pay

## 2013-01-22 ENCOUNTER — Ambulatory Visit (INDEPENDENT_AMBULATORY_CARE_PROVIDER_SITE_OTHER): Payer: Medicare Other | Admitting: Internal Medicine

## 2013-01-22 ENCOUNTER — Encounter: Payer: Self-pay | Admitting: Internal Medicine

## 2013-01-22 VITALS — BP 130/70 | HR 60 | Temp 98.1°F | Ht 69.25 in | Wt 211.0 lb

## 2013-01-22 DIAGNOSIS — Z860100 Personal history of colon polyps, unspecified: Secondary | ICD-10-CM

## 2013-01-22 DIAGNOSIS — E119 Type 2 diabetes mellitus without complications: Secondary | ICD-10-CM

## 2013-01-22 DIAGNOSIS — Z23 Encounter for immunization: Secondary | ICD-10-CM

## 2013-01-22 DIAGNOSIS — D649 Anemia, unspecified: Secondary | ICD-10-CM

## 2013-01-22 DIAGNOSIS — E7800 Pure hypercholesterolemia, unspecified: Secondary | ICD-10-CM

## 2013-01-22 DIAGNOSIS — M79601 Pain in right arm: Secondary | ICD-10-CM

## 2013-01-22 DIAGNOSIS — I10 Essential (primary) hypertension: Secondary | ICD-10-CM

## 2013-01-22 DIAGNOSIS — M79609 Pain in unspecified limb: Secondary | ICD-10-CM

## 2013-01-22 DIAGNOSIS — I2510 Atherosclerotic heart disease of native coronary artery without angina pectoris: Secondary | ICD-10-CM

## 2013-01-22 DIAGNOSIS — M79602 Pain in left arm: Secondary | ICD-10-CM

## 2013-01-22 LAB — HEPATIC FUNCTION PANEL
ALT: 26 U/L (ref 0–53)
AST: 25 U/L (ref 0–37)
Albumin: 3.5 g/dL (ref 3.5–5.2)
Alkaline Phosphatase: 42 U/L (ref 39–117)
Bilirubin, Direct: 0.1 mg/dL (ref 0.0–0.3)
Total Bilirubin: 0.7 mg/dL (ref 0.3–1.2)
Total Protein: 7.2 g/dL (ref 6.0–8.3)

## 2013-01-22 LAB — BASIC METABOLIC PANEL WITH GFR
BUN: 20 mg/dL (ref 6–23)
CO2: 26 meq/L (ref 19–32)
Calcium: 9.2 mg/dL (ref 8.4–10.5)
Chloride: 104 meq/L (ref 96–112)
Creatinine, Ser: 1.2 mg/dL (ref 0.4–1.5)
GFR: 64.96 mL/min
Glucose, Bld: 236 mg/dL — ABNORMAL HIGH (ref 70–99)
Potassium: 4.6 meq/L (ref 3.5–5.1)
Sodium: 136 meq/L (ref 135–145)

## 2013-01-22 LAB — LDL CHOLESTEROL, DIRECT: Direct LDL: 61 mg/dL

## 2013-01-22 LAB — TSH: TSH: 3.21 u[IU]/mL (ref 0.35–5.50)

## 2013-01-22 LAB — LIPID PANEL
Total CHOL/HDL Ratio: 7
Triglycerides: 751 mg/dL — ABNORMAL HIGH (ref 0.0–149.0)

## 2013-01-22 NOTE — Progress Notes (Signed)
Subjective:    Patient ID: Zachary Buck, male    DOB: [birth year], 72 y.o.   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  He comes in today for a scheduled follow up.   Not doing regular exercise.  No chest pain or tightness with increased activity or exertion.  Breathing stable.  Bowels stable.  Still having persistent pain in his hands and arms.  Saw Dr Gavin Potters.  Was given a rx for neurontin.  He refuses to take.  Saw ortho.  Had MRI of neck and upper arm.  Was referred to the cancer center (medical center).  Had follow up mri of upper arm in November. States everything checked out fine.  Discomfort has improved.  Brought in no recorded sugar readings.  Not checking his sugars.  Not watching what he eats.  Discussed need for diet and exercise.  Also discussed the need for recording sugars for our review.  Taking the metformin.     Past Medical History  Diagnosis Date  . Hypercholesterolemia   . Atherosclerotic heart disease     s/p MI, s/p stent mid circumflex  . Hypertension   . Diabetes mellitus without complication   . Clavicle fracture   . Leg fracture     Current Outpatient Prescriptions on File Prior to Visit  Medication Sig Dispense Refill  . aspirin 81 MG tablet Take 81 mg by mouth daily.      . Cholecalciferol (VITAMIN D-3) 1000 UNITS CAPS Take by mouth.      . Coenzyme Q10 (COQ-10) 100 MG CAPS Take by mouth daily.      Marland Kitchen glucose blood test strip 1 each by Other route as needed. Use as instructed      . levocarnitine (CARNITOR) 250 MG capsule Take 250 mg by mouth daily.      Marland Kitchen lisinopril (PRINIVIL,ZESTRIL) 20 MG tablet Take 20 mg by mouth daily.      . metFORMIN (GLUCOPHAGE) 500 MG tablet Take 1 tablet (500 mg total) by mouth 2 (two) times daily.  60 tablet  3  . Omega-3 Fatty Acids (FISH OIL) 1000 MG CAPS Take 3 capsules by mouth daily.      . rosuvastatin  (CRESTOR) 20 MG tablet Take 20 mg by mouth daily.      . vitamin E (VITAMIN E) 400 UNIT capsule Take 400 Units by mouth daily.      . vitamin k 100 MCG tablet Take 100 mcg by mouth daily.       No current facility-administered medications on file prior to visit.    Review of Systems Patient denies any headache, lightheadedness or dizziness. No sinus or allergy symptoms.   No chest pain, tightness or palpitations.  No increased shortness of breath, cough or congestion.  No acid reflux.  No nausea or vomiting.  No abdominal pain or cramping.  No bowel change, such as diarrhea, constipation, BRBPR or melana.  No urine change.  Arm pain persistent.  Better.  Just had MRI.  Everything checked out fine.  Recommended f/u in one year.       Objective:   Physical Exam  Filed Vitals:   01/22/13 0805  BP: 130/70  Pulse: 60  Temp: 98.1 F (60.75 C)   72 year old male in no acute distress.  HEENT:  Nares - clear.  Oropharynx - without lesions. NECK:  Supple.  Nontender.  No audible carotid bruit.  HEART:  Appears to be regular.   LUNGS:  No crackles or wheezing audible.  Respirations even and unlabored.   RADIAL PULSE:  Equal bilaterally.  ABDOMEN:  Soft.  Nontender.  Bowel sounds present and normal.  No audible abdominal bruit.    EXTREMITIES:  No increased edema present.  DP pulses palpable and equal bilaterally. FEET:  No lesions.       Assessment & Plan:  GI.  Has had no further bleeding.  Was referred to GI.    HEALTH MAINTENANCE.  Physical 10/09/12.    Prostate checks and PSAs through Dr Achilles Dunk.  GI as outlined.

## 2013-01-22 NOTE — Progress Notes (Signed)
Pre-visit discussion using our clinic review tool. No additional management support is needed unless otherwise documented below in the visit note.  

## 2013-01-24 ENCOUNTER — Other Ambulatory Visit: Payer: Self-pay | Admitting: *Deleted

## 2013-01-24 MED ORDER — METFORMIN HCL 1000 MG PO TABS: 1000.0000 mg | ORAL_TABLET | Freq: Two times a day (BID) | ORAL | Status: DC

## 2013-01-24 NOTE — Telephone Encounter (Signed)
Left message, notifying pt of message and results. Requested call back to confirm he received information and id agreeable to Metformin dose change.

## 2013-01-25 ENCOUNTER — Encounter: Payer: Self-pay | Admitting: Internal Medicine

## 2013-01-25 NOTE — Assessment & Plan Note (Signed)
Discussed the importance of diet and exercise. Discussed the need to record sugars for review.  Check check met b and a1c.  Follow closely.  Have him send in readings over the next few weeks.  Discussed need for eye exam.  Sees Dr Senaida Ores.

## 2013-01-25 NOTE — Assessment & Plan Note (Signed)
Hand and arm pain as outlined.  Has seen Dr Kernodle, ortho and cancer center.  Had MRI of upper arm and neck previously and just had f/u mri.  States everything checked out fine.  Recommended f/u mri in one year.  Discussed gabapentin.  He declines.  Discussed obtaining nerve conduction studies.  He declines.  He desires no further w/up.  Follow.    

## 2013-01-25 NOTE — Assessment & Plan Note (Signed)
On previous check hgb slightly decreased.  Iron studies wnl.  Follow.    

## 2013-01-25 NOTE — Assessment & Plan Note (Signed)
Blood pressure is doing well.  Same med regimen. Follow.  Follow metabolic panel.   

## 2013-01-25 NOTE — Assessment & Plan Note (Signed)
Last colonoscopy that I have results - 2007.  Hyperplastic polyp.   No further bleeding.  Referred to GI.   

## 2013-01-25 NOTE — Assessment & Plan Note (Signed)
No chest pain or tightness with increased activity or exertion.  Stable.  Follow.   

## 2013-01-25 NOTE — Assessment & Plan Note (Signed)
On Crestor.  Low cholesterol diet and exercise.  Follow lipid panel and liver function.   

## 2013-02-12 ENCOUNTER — Ambulatory Visit (INDEPENDENT_AMBULATORY_CARE_PROVIDER_SITE_OTHER): Payer: Medicare Other | Admitting: Internal Medicine

## 2013-02-12 ENCOUNTER — Encounter: Payer: Self-pay | Admitting: Internal Medicine

## 2013-02-12 VITALS — BP 128/60 | HR 102 | Temp 99.0°F | Wt 207.0 lb

## 2013-02-12 DIAGNOSIS — R6889 Other general symptoms and signs: Secondary | ICD-10-CM

## 2013-02-12 DIAGNOSIS — J111 Influenza due to unidentified influenza virus with other respiratory manifestations: Secondary | ICD-10-CM

## 2013-02-12 LAB — POCT INFLUENZA A/B
Influenza A, POC: POSITIVE
Influenza B, POC: POSITIVE

## 2013-02-12 MED ORDER — GUAIFENESIN-CODEINE 100-10 MG/5ML PO SYRP: 5.0000 mL | ORAL_SOLUTION | Freq: Two times a day (BID) | ORAL | Status: DC | PRN

## 2013-02-12 MED ORDER — OSELTAMIVIR PHOSPHATE 75 MG PO CAPS: 75.0000 mg | ORAL_CAPSULE | Freq: Two times a day (BID) | ORAL | Status: DC

## 2013-02-12 NOTE — Assessment & Plan Note (Signed)
Symptoms consistent with Flu and rapid test positive. Will start Tamiflu. Encouraged rest, adequate fluid intake. Tylenol or ibuprofen as needed for fever or pain. If any persistent fever, worsening cough, shortness of breath, or other concerns, pt will follow up in clinic.

## 2013-02-12 NOTE — Progress Notes (Signed)
Pre-visit discussion using our clinic review tool. No additional management support is needed unless otherwise documented below in the visit note.  

## 2013-02-12 NOTE — Progress Notes (Signed)
Subjective:    Patient ID: Zachary Buck, male    DOB: 1941-01-03, 72 y.o.   MRN: [id number]  HPI  71YO male with CAD, diabetes, hypertension developed cough, sore throat, malaise on Monday. Cough non-productive. Symptoms persistent yesterday and today. No dyspnea.  No fever, chills. Non-smoker. Had flu shot this year. No known sick contacts, however pt works as Programmer, multimedia.  Outpatient Encounter Prescriptions as of 02/12/2013  Medication Sig  . aspirin 81 MG tablet Take 81 mg by mouth daily.  . Cholecalciferol (VITAMIN D-3) 1000 UNITS CAPS Take by mouth.  . Coenzyme Q10 (COQ-10) 100 MG CAPS Take by mouth daily.  Marland Kitchen glucose blood test strip 1 each by Other route as needed. Use as instructed  . levocarnitine (CARNITOR) 250 MG capsule Take 250 mg by mouth daily.  Marland Kitchen lisinopril (PRINIVIL,ZESTRIL) 20 MG tablet Take 20 mg by mouth daily.  . metFORMIN (GLUCOPHAGE) 1000 MG tablet Take 1 tablet (1,000 mg total) by mouth 2 (two) times daily with a meal.  . Omega-3 Fatty Acids (FISH OIL) 1000 MG CAPS Take 3 capsules by mouth daily.  . rosuvastatin (CRESTOR) 20 MG tablet Take 20 mg by mouth daily.  . vitamin E (VITAMIN E) 400 UNIT capsule Take 400 Units by mouth daily.  . vitamin k 100 MCG tablet Take 100 mcg by mouth daily.     Review of Systems  Constitutional: Positive for fatigue. Negative for fever, chills and activity change.  HENT: Positive for congestion, postnasal drip and rhinorrhea. Negative for ear discharge, ear pain, hearing loss, nosebleeds, sinus pressure, sneezing, sore throat, tinnitus, trouble swallowing and voice change.   Eyes: Negative for discharge, redness, itching and visual disturbance.  Respiratory: Positive for cough. Negative for chest tightness, shortness of breath, wheezing and stridor.   Cardiovascular: Negative for chest pain and leg swelling.  Musculoskeletal: Negative for arthralgias, myalgias, neck pain and neck stiffness.  Skin: Negative for color  change and rash.  Neurological: Negative for dizziness, facial asymmetry and headaches.  Psychiatric/Behavioral: Negative for sleep disturbance.       Objective:   Physical Exam  Constitutional: He is oriented to person, place, and time. He appears well-developed and well-nourished. No distress.  HENT:  Head: Normocephalic and atraumatic.  Right Ear: Tympanic membrane and external ear normal.  Left Ear: Tympanic membrane and external ear normal.  Nose: Nose normal.  Mouth/Throat: Oropharynx is clear and moist. No oropharyngeal exudate.  Eyes: Conjunctivae and EOM are normal. Pupils are equal, round, and reactive to light. Right eye exhibits no discharge. Left eye exhibits no discharge. No scleral icterus.  Neck: Normal range of motion. Neck supple. No tracheal deviation present. No thyromegaly present.  Cardiovascular: Normal rate, regular rhythm and normal heart sounds.  Exam reveals no gallop and no friction rub.   No murmur heard. Pulmonary/Chest: Effort normal. No accessory muscle usage. Not tachypneic. No respiratory distress. He has no decreased breath sounds. He has no wheezes. Rhonchi: scattered. He has no rales. He exhibits no tenderness.  Musculoskeletal: Normal range of motion. He exhibits no edema.  Lymphadenopathy:    He has no cervical adenopathy.  Neurological: He is alert and oriented to person, place, and time. No cranial nerve deficit. Coordination normal.  Skin: Skin is warm and dry. No rash noted. He is not diaphoretic. No erythema. No pallor.  Psychiatric: He has a normal mood and affect. His behavior is normal. Judgment and thought content normal.  Assessment & Plan:

## 2013-02-12 NOTE — Patient Instructions (Signed)

## 2013-04-07 ENCOUNTER — Encounter: Payer: Self-pay | Admitting: Internal Medicine

## 2013-04-10 MED ORDER — GLUCOSE BLOOD VI STRP: ORAL_STRIP | Status: DC

## 2013-05-07 ENCOUNTER — Other Ambulatory Visit: Payer: Self-pay | Admitting: *Deleted

## 2013-05-07 MED ORDER — GLUCOSE BLOOD VI STRP: ORAL_STRIP | Status: DC

## 2013-05-13 ENCOUNTER — Encounter: Payer: Self-pay | Admitting: Internal Medicine

## 2013-05-13 ENCOUNTER — Other Ambulatory Visit (INDEPENDENT_AMBULATORY_CARE_PROVIDER_SITE_OTHER): Payer: Commercial Managed Care - HMO

## 2013-05-13 ENCOUNTER — Ambulatory Visit (INDEPENDENT_AMBULATORY_CARE_PROVIDER_SITE_OTHER): Payer: Medicare HMO | Admitting: Internal Medicine

## 2013-05-13 ENCOUNTER — Encounter: Payer: Self-pay | Admitting: *Deleted

## 2013-05-13 VITALS — BP 122/80 | HR 65 | Temp 97.8°F | Ht 69.25 in | Wt 200.5 lb

## 2013-05-13 DIAGNOSIS — D649 Anemia, unspecified: Secondary | ICD-10-CM

## 2013-05-13 DIAGNOSIS — I2510 Atherosclerotic heart disease of native coronary artery without angina pectoris: Secondary | ICD-10-CM

## 2013-05-13 DIAGNOSIS — M79602 Pain in left arm: Secondary | ICD-10-CM

## 2013-05-13 DIAGNOSIS — I10 Essential (primary) hypertension: Secondary | ICD-10-CM

## 2013-05-13 DIAGNOSIS — E7800 Pure hypercholesterolemia, unspecified: Secondary | ICD-10-CM

## 2013-05-13 DIAGNOSIS — M79609 Pain in unspecified limb: Secondary | ICD-10-CM

## 2013-05-13 DIAGNOSIS — M79601 Pain in right arm: Secondary | ICD-10-CM

## 2013-05-13 DIAGNOSIS — E119 Type 2 diabetes mellitus without complications: Secondary | ICD-10-CM

## 2013-05-13 DIAGNOSIS — Z86010 Personal history of colonic polyps: Secondary | ICD-10-CM

## 2013-05-13 LAB — GLUCOSE, FINGERSTICK (STAT): .: 174

## 2013-05-13 NOTE — Progress Notes (Signed)
Pre-visit discussion using our clinic review tool. No additional management support is needed unless otherwise documented below in the visit note.  

## 2013-05-13 NOTE — Progress Notes (Signed)
Subjective:    Patient ID: Zachary Buck, male    DOB: [birth year], 73 y.o.   MRN: [id number]  HPI 73 year old male with past history of CAD s/p MI (75% lesion mid Cfx) s/p stent, hypertension, hypercholesterolemia and diabetes.  He comes in today for a scheduled follow up.   Not doing regular exercise.  No chest pain or tightness with increased activity or exertion.  Breathing stable.  Bowels stable.  Still having persistent pain in his hands and arms.  Saw Dr Jefm Bryant.  Was given a rx for neurontin.  He refuses to take.  Saw ortho.  Had MRI of neck and upper arm.  Was referred to the cancer center (medical center).  Had follow up mri of upper arm in November. States everything checked out fine.  Discomfort has improved.  Still present, but improved.  Brought in no recorded sugar readings.  Not checking his sugars regularly.   Not watching what he eats.  Discussed need for diet and exercise.  Also discussed the need for recording sugars for our review.  Taking the metformin.  Discussed the need to adjust his medications - addition of medication.  He declines.     Past Medical History  Diagnosis Date  . Hypercholesterolemia   . Atherosclerotic heart disease     s/p MI, s/p stent mid circumflex  . Hypertension   . Diabetes mellitus without complication   . Clavicle fracture   . Leg fracture     Current Outpatient Prescriptions on File Prior to Visit  Medication Sig Dispense Refill  . aspirin 81 MG tablet Take 81 mg by mouth daily.      . Cholecalciferol (VITAMIN D-3) 1000 UNITS CAPS Take by mouth.      . Coenzyme Q10 (COQ-10) 100 MG CAPS Take by mouth daily.      Marland Kitchen glucose blood (BAYER CONTOUR NEXT TEST) test strip Contour Next EZ test strips: check blood sugars twice a day. Dx: 250.00  100 each  12  . guaiFENesin-codeine (ROBITUSSIN AC) 100-10 MG/5ML syrup Take 5 mLs by mouth 2 (two) times daily as needed for cough.  240 mL  0  . levocarnitine (CARNITOR) 250 MG capsule Take 250 mg  by mouth daily.      Marland Kitchen lisinopril (PRINIVIL,ZESTRIL) 20 MG tablet Take 20 mg by mouth daily.      . metFORMIN (GLUCOPHAGE) 1000 MG tablet Take 1 tablet (1,000 mg total) by mouth 2 (two) times daily with a meal.  180 tablet  3  . Omega-3 Fatty Acids (FISH OIL) 1000 MG CAPS Take 3 capsules by mouth daily.      . rosuvastatin (CRESTOR) 20 MG tablet Take 20 mg by mouth daily.      . vitamin E (VITAMIN E) 400 UNIT capsule Take 400 Units by mouth daily.      . vitamin k 100 MCG tablet Take 100 mcg by mouth daily.       No current facility-administered medications on file prior to visit.    Review of Systems Patient denies any headache, lightheadedness or dizziness. No sinus or allergy symptoms.   No chest pain, tightness or palpitations.  No increased shortness of breath, cough or congestion.  No acid reflux.  No nausea or vomiting.  No abdominal pain or cramping.  No bowel change, such as diarrhea, constipation, BRBPR or melana.  No urine change.  Arm pain persistent.  Better.  Just had MRI.  Everything checked out fine.  Recommended f/u  in one year.  Brought in no recorded sugar readings.  States before walking sugar 180 and after walking 160.       Objective:   Physical Exam  Filed Vitals:   05/13/13 1358  BP: 122/80  Pulse: 65  Temp: 97.8 F (36.6 C)   Blood pressure recheck:  130/72, pulse 42  73 year old male in no acute distress.  HEENT:  Nares - clear.  Oropharynx - without lesions. NECK:  Supple.  Nontender.  No audible carotid bruit.  HEART:  Appears to be regular.   LUNGS:  No crackles or wheezing audible.  Respirations even and unlabored.   RADIAL PULSE:  Equal bilaterally.  ABDOMEN:  Soft.  Nontender.  Bowel sounds present and normal.  No audible abdominal bruit.    EXTREMITIES:  No increased edema present.  DP pulses palpable and equal bilaterally. FEET:  No lesions.       Assessment & Plan:  GI.  Has had no further bleeding.  Was referred to GI.    HEALTH  MAINTENANCE.  Physical 10/09/12.    Prostate checks and PSAs through Dr Jacqlyn Larsen.  GI as outlined.  Declines pneumovax/prevnar.

## 2013-05-13 NOTE — Assessment & Plan Note (Addendum)
Discussed the importance of diet and exercise. Discussed the need to record sugars for review.  Check check met b and a1c.  Follow closely.  Have him send in readings over the next few weeks.  Discussed need for eye exam.  Sees Dr Marvel Plan.  Has appt tomorrow.  Declines addition of other medication.

## 2013-05-14 LAB — LIPID PANEL
CHOLESTEROL: 209 mg/dL — AB (ref 0–200)
HDL: 33.4 mg/dL — ABNORMAL LOW (ref >39.00)
LDL CALC: 51 mg/dL (ref 0–99)
TRIGLYCERIDES: 625 mg/dL — AB (ref 0.0–149.0)
Total CHOL/HDL Ratio: 6
VLDL: 125 mg/dL — ABNORMAL HIGH (ref 0.0–40.0)

## 2013-05-14 LAB — HEMOGLOBIN A1C: HEMOGLOBIN A1C: 7.7 % — AB (ref 4.6–6.5)

## 2013-05-14 LAB — BASIC METABOLIC PANEL
BUN: 20 mg/dL (ref 6–23)
CO2: 25 mEq/L (ref 19–32)
CREATININE: 1.3 mg/dL (ref 0.4–1.5)
Calcium: 9.6 mg/dL (ref 8.4–10.5)
Chloride: 101 mEq/L (ref 96–112)
GFR: 59.58 mL/min — AB (ref >60.00)
Glucose, Bld: 146 mg/dL — ABNORMAL HIGH (ref 70–99)
Potassium: 4.3 mEq/L (ref 3.5–5.1)
Sodium: 134 mEq/L — ABNORMAL LOW (ref 135–145)

## 2013-05-14 LAB — HEPATIC FUNCTION PANEL
ALBUMIN: 3.8 g/dL (ref 3.5–5.2)
ALK PHOS: 39 U/L (ref 39–117)
ALT: 28 U/L (ref 0–53)
AST: 26 U/L (ref 0–37)
Bilirubin, Direct: 0 mg/dL (ref 0.0–0.3)
TOTAL PROTEIN: 8 g/dL (ref 6.0–8.3)
Total Bilirubin: 0.9 mg/dL (ref 0.3–1.2)

## 2013-05-14 NOTE — Addendum Note (Signed)
Addended by: Daralene Milch C on: 05/14/2013 10:07 AM   Modules accepted: Orders

## 2013-05-15 NOTE — Telephone Encounter (Signed)
Unread mychart message mailed  

## 2013-05-17 ENCOUNTER — Encounter: Payer: Self-pay | Admitting: Internal Medicine

## 2013-05-17 NOTE — Assessment & Plan Note (Signed)
On previous check hgb slightly decreased.  Iron studies wnl.  Follow.    

## 2013-05-17 NOTE — Assessment & Plan Note (Signed)
Hand and arm pain as outlined.  Has seen Dr Kernodle, ortho and cancer center.  Had MRI of upper arm and neck previously and just had f/u mri.  States everything checked out fine.  Recommended f/u mri in one year.  Discussed gabapentin.  He declines.  Discussed obtaining nerve conduction studies.  He declines.  He desires no further w/up.  Follow.    

## 2013-05-17 NOTE — Assessment & Plan Note (Signed)
Blood pressure is doing well.  Same med regimen. Follow.  Follow metabolic panel.   

## 2013-05-17 NOTE — Assessment & Plan Note (Signed)
Last colonoscopy that I have results - 2007.  Hyperplastic polyp.   No further bleeding.  Referred to GI.

## 2013-05-17 NOTE — Assessment & Plan Note (Signed)
No chest pain or tightness with increased activity or exertion.  Stable.  Follow.

## 2013-05-17 NOTE — Assessment & Plan Note (Signed)
On Crestor.  Low cholesterol diet and exercise.  Follow lipid panel and liver function.   

## 2013-05-20 ENCOUNTER — Telehealth: Payer: Self-pay | Admitting: Internal Medicine

## 2013-05-20 ENCOUNTER — Encounter: Payer: Self-pay | Admitting: Internal Medicine

## 2013-05-20 DIAGNOSIS — E871 Hypo-osmolality and hyponatremia: Secondary | ICD-10-CM

## 2013-05-20 NOTE — Telephone Encounter (Signed)
Appointment  4/10 sent my chart message letting pt know about appointment date and time

## 2013-05-20 NOTE — Telephone Encounter (Signed)
Pt notified of lab results via my chart.  Needs a f/u non fasting lab in 1-2 weeks.  Please schedule and notify pt of appt date and time.  Thanks.

## 2013-05-21 ENCOUNTER — Ambulatory Visit: Payer: Medicare Other | Admitting: Internal Medicine

## 2013-06-03 ENCOUNTER — Other Ambulatory Visit (INDEPENDENT_AMBULATORY_CARE_PROVIDER_SITE_OTHER): Payer: Medicare HMO

## 2013-06-03 DIAGNOSIS — E871 Hypo-osmolality and hyponatremia: Secondary | ICD-10-CM

## 2013-06-03 LAB — SODIUM: SODIUM: 136 meq/L (ref 135–145)

## 2013-06-03 NOTE — Telephone Encounter (Signed)
Patient unsure about starting medication and would like to discuss.. Please advise.

## 2013-06-04 ENCOUNTER — Encounter: Payer: Self-pay | Admitting: Internal Medicine

## 2013-06-19 ENCOUNTER — Ambulatory Visit (INDEPENDENT_AMBULATORY_CARE_PROVIDER_SITE_OTHER)
Admission: RE | Admit: 2013-06-19 | Discharge: 2013-06-19 | Disposition: A | Discharge status: Home / Self Care | Payer: Medicare HMO | Source: Ambulatory Visit | Attending: Internal Medicine | Admitting: Internal Medicine

## 2013-06-19 ENCOUNTER — Ambulatory Visit (INDEPENDENT_AMBULATORY_CARE_PROVIDER_SITE_OTHER): Payer: Medicare HMO | Admitting: Internal Medicine

## 2013-06-19 ENCOUNTER — Encounter: Payer: Self-pay | Admitting: Internal Medicine

## 2013-06-19 ENCOUNTER — Other Ambulatory Visit: Payer: Self-pay | Admitting: Internal Medicine

## 2013-06-19 VITALS — BP 153/89 | HR 80 | Temp 98.5°F | Ht 69.25 in | Wt 199.0 lb

## 2013-06-19 DIAGNOSIS — J189 Pneumonia, unspecified organism: Secondary | ICD-10-CM

## 2013-06-19 DIAGNOSIS — E119 Type 2 diabetes mellitus without complications: Secondary | ICD-10-CM

## 2013-06-19 DIAGNOSIS — R509 Fever, unspecified: Secondary | ICD-10-CM

## 2013-06-19 DIAGNOSIS — R9389 Abnormal findings on diagnostic imaging of other specified body structures: Secondary | ICD-10-CM

## 2013-06-19 LAB — CBC WITH DIFFERENTIAL/PLATELET
Basophils Absolute: 0.1 10*3/uL (ref 0.0–0.1)
Basophils Relative: 0.4 % (ref 0.0–3.0)
EOS ABS: 0.1 10*3/uL (ref 0.0–0.7)
Eosinophils Relative: 1 % (ref 0.0–5.0)
HCT: 40.1 % (ref 39.0–52.0)
HEMOGLOBIN: 13.4 g/dL (ref 13.0–17.0)
LYMPHS PCT: 17.2 % (ref 12.0–46.0)
Lymphs Abs: 2.3 10*3/uL (ref 0.7–4.0)
MCHC: 33.5 g/dL (ref 30.0–36.0)
MCV: 86 fl (ref 78.0–100.0)
MONO ABS: 1.7 10*3/uL — AB (ref 0.1–1.0)
Monocytes Relative: 12.9 % — ABNORMAL HIGH (ref 3.0–12.0)
NEUTROS ABS: 9 10*3/uL — AB (ref 1.4–7.7)
Neutrophils Relative %: 68.5 % (ref 43.0–77.0)
Platelets: 255 10*3/uL (ref 150.0–400.0)
RBC: 4.67 Mil/uL (ref 4.22–5.81)
RDW: 13.9 % (ref 11.5–14.6)
WBC: 13.1 10*3/uL — AB (ref 4.5–10.5)

## 2013-06-19 MED ORDER — DOXYCYCLINE HYCLATE 100 MG PO TABS: 100.0000 mg | ORAL_TABLET | Freq: Two times a day (BID) | ORAL | Status: DC

## 2013-06-19 NOTE — Progress Notes (Signed)
Pre visit review using our clinic review tool, if applicable. No additional management support is needed unless otherwise documented below in the visit note. 

## 2013-06-19 NOTE — Progress Notes (Signed)
Order placed for f/u cxr to be done in 4-6 weeks.

## 2013-06-20 ENCOUNTER — Telehealth: Payer: Self-pay | Admitting: *Deleted

## 2013-06-20 NOTE — Telephone Encounter (Signed)
Spoke with patient, states he is doing well, no worsening symptoms, no fever today. Taking Doxycycline as directed, tolerating without difficulty. Pt will call back if with persistent or worsening symptoms.

## 2013-06-20 NOTE — Telephone Encounter (Signed)
Noted. Sent mychart message to pt.

## 2013-06-22 ENCOUNTER — Encounter: Payer: Self-pay | Admitting: Internal Medicine

## 2013-06-22 DIAGNOSIS — R9389 Abnormal findings on diagnostic imaging of other specified body structures: Secondary | ICD-10-CM | POA: Insufficient documentation

## 2013-06-22 NOTE — Assessment & Plan Note (Signed)
Have discussed the importance of diet and exercise. Discussed the need to record sugars for review.  Follow sugars.  Send in readings.

## 2013-06-22 NOTE — Progress Notes (Signed)
Subjective:    Patient ID: JMARI PELC, male    DOB: 1940-10-06, 73 y.o.   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  He comes in today as a work in with concerns regarding sore throat, mild headache and fever.  He also reports that when he takes a good deep breath, he feels "something different" in his chest.  No chest pain or tightness.  No sob.  Breathing stable.  Bowels stable.  Low grade fever.  Tmax 100.  Goes out in the woods a lot.  No known tick exposure.  no rash.  Eating and drinking well.  Not checking his sugars.      Past Medical History  Diagnosis Date  . Hypercholesterolemia   . Atherosclerotic heart disease     s/p MI, s/p stent mid circumflex  . Hypertension   . Diabetes mellitus without complication   . Clavicle fracture   . Leg fracture     Current Outpatient Prescriptions on File Prior to Visit  Medication Sig Dispense Refill  . aspirin 81 MG tablet Take 81 mg by mouth daily.      . Cholecalciferol (VITAMIN D-3) 1000 UNITS CAPS Take by mouth.      . Coenzyme Q10 (COQ-10) 100 MG CAPS Take by mouth daily.      Marland Kitchen glucose blood (BAYER CONTOUR NEXT TEST) test strip Contour Next EZ test strips: check blood sugars twice a day. Dx: 250.00  100 each  12  . levocarnitine (CARNITOR) 250 MG capsule Take 250 mg by mouth daily.      Marland Kitchen lisinopril (PRINIVIL,ZESTRIL) 20 MG tablet Take 20 mg by mouth daily.      . Omega-3 Fatty Acids (FISH OIL) 1000 MG CAPS Take 3 capsules by mouth daily.      . rosuvastatin (CRESTOR) 20 MG tablet Take 20 mg by mouth daily.      . vitamin E (VITAMIN E) 400 UNIT capsule Take 400 Units by mouth daily.      . vitamin k 100 MCG tablet Take 100 mcg by mouth daily.      Marland Kitchen guaiFENesin-codeine (ROBITUSSIN AC) 100-10 MG/5ML syrup Take 5 mLs by mouth 2 (two) times daily as needed for cough.  240 mL  0   No current  facility-administered medications on file prior to visit.    Review of Systems  Constitutional: Positive for fever.  Patient denies any  lightheadedness or dizziness. Does report some mild headache.   No sinus or allergy symptoms.   No chest pain, tightness or palpitations.  No increased shortness of breath.  Minimal cough.  Different sensation with a deep breath.  No acid reflux.  No nausea or vomiting.  No abdominal pain or cramping.  No bowel change, such as diarrhea, constipation, BRBPR or melana.  No urine change.  Low grade fever as outlined.       Objective:   Physical Exam  Filed Vitals:   06/19/13 0812  BP: 153/89  Pulse: 80  Temp: 98.5 F (100.69 C)   73 year old male in no acute distress.  HEENT:  Nares - clear.  Oropharynx - without lesions. NECK:  Supple.  Nontender.  No audible carotid bruit.  HEART:  Appears to be regular.   LUNGS:  No crackles or wheezing audible.  Respirations even and unlabored.  Good breath sounds bilaterally.  RADIAL PULSE:  Equal bilaterally.  ABDOMEN:  Soft.  Nontender.  Bowel sounds present and normal.  No audible abdominal bruit.    EXTREMITIES:  No increased edema present.  DP pulses palpable and equal bilaterally. SKIN;  No rash.       Assessment & Plan:  HEALTH MAINTENANCE.  Physical 10/09/12.    Prostate checks and PSAs through Dr Jacqlyn Larsen.  GI as outlined.  Declines pneumovax/prevnar.    ADDENDUM.  Chest xray returned positive for pneumonia.  Pt has already picked up rx for doxycycline.  Will continue for now.  Call with update.  If any change or worsening symptoms, will change abx.

## 2013-06-22 NOTE — Assessment & Plan Note (Signed)
With headache and low grade fever.  In the woods a lot.  No increased cough or congestion.  Does have the different sensation in his chest with deep breathing.  Will cover with doxycycline.  Check cbc and chest xray.  Further w/up pending results.  Pt comfortable with this plan.  Will call with update tomorrow.  Any worsening change or problems, he is to be evaluated.

## 2013-06-25 ENCOUNTER — Encounter: Payer: Self-pay | Admitting: Internal Medicine

## 2013-07-01 ENCOUNTER — Encounter (INDEPENDENT_AMBULATORY_CARE_PROVIDER_SITE_OTHER): Payer: Medicare HMO | Admitting: Ophthalmology

## 2013-07-01 DIAGNOSIS — E11319 Type 2 diabetes mellitus with unspecified diabetic retinopathy without macular edema: Secondary | ICD-10-CM

## 2013-07-01 DIAGNOSIS — E1165 Type 2 diabetes mellitus with hyperglycemia: Secondary | ICD-10-CM

## 2013-07-01 DIAGNOSIS — H43819 Vitreous degeneration, unspecified eye: Secondary | ICD-10-CM

## 2013-07-01 DIAGNOSIS — E1139 Type 2 diabetes mellitus with other diabetic ophthalmic complication: Secondary | ICD-10-CM

## 2013-07-01 DIAGNOSIS — H35379 Puckering of macula, unspecified eye: Secondary | ICD-10-CM

## 2013-07-01 DIAGNOSIS — H2510 Age-related nuclear cataract, unspecified eye: Secondary | ICD-10-CM

## 2013-07-02 NOTE — Telephone Encounter (Signed)
Pt notified and verbalized understanding regarding appt tomorrow at 12:00 with Dr. Nicki Reaper

## 2013-07-03 ENCOUNTER — Encounter: Payer: Self-pay | Admitting: Internal Medicine

## 2013-07-03 ENCOUNTER — Ambulatory Visit: Payer: Commercial Managed Care - HMO | Admitting: Internal Medicine

## 2013-07-09 ENCOUNTER — Ambulatory Visit (INDEPENDENT_AMBULATORY_CARE_PROVIDER_SITE_OTHER)
Admission: RE | Admit: 2013-07-09 | Discharge: 2013-07-09 | Disposition: A | Discharge status: Home / Self Care | Payer: Medicare HMO | Source: Ambulatory Visit | Attending: Internal Medicine | Admitting: Internal Medicine

## 2013-07-09 DIAGNOSIS — J189 Pneumonia, unspecified organism: Secondary | ICD-10-CM

## 2013-07-09 DIAGNOSIS — R9389 Abnormal findings on diagnostic imaging of other specified body structures: Secondary | ICD-10-CM

## 2013-07-09 DIAGNOSIS — R918 Other nonspecific abnormal finding of lung field: Secondary | ICD-10-CM

## 2013-07-10 ENCOUNTER — Encounter: Payer: Self-pay | Admitting: Internal Medicine

## 2013-07-11 ENCOUNTER — Encounter: Payer: Self-pay | Admitting: Internal Medicine

## 2013-07-11 ENCOUNTER — Ambulatory Visit (INDEPENDENT_AMBULATORY_CARE_PROVIDER_SITE_OTHER): Payer: Medicare HMO | Admitting: Internal Medicine

## 2013-07-11 VITALS — BP 110/70 | HR 77 | Temp 98.0°F | Ht 69.25 in | Wt 198.2 lb

## 2013-07-11 DIAGNOSIS — R197 Diarrhea, unspecified: Secondary | ICD-10-CM

## 2013-07-11 DIAGNOSIS — J189 Pneumonia, unspecified organism: Secondary | ICD-10-CM

## 2013-07-11 NOTE — Patient Instructions (Signed)
Zantac (ranitidine) 150mg  - take one per day.  Take 30 minutes before breakfast.    Align - one per day.

## 2013-07-11 NOTE — Progress Notes (Signed)
Pre visit review using our clinic review tool, if applicable. No additional management support is needed unless otherwise documented below in the visit note. 

## 2013-07-11 NOTE — Telephone Encounter (Signed)
I reviewed his my chart.  Since he is having persistent symptoms with previous pneumonia, I need to see him.  See if he can come in today to be worked in 12:15 for this.  If not able, I will have to work him in next week.  If any acute symptoms - needs evaluation.

## 2013-07-13 ENCOUNTER — Encounter: Payer: Self-pay | Admitting: Internal Medicine

## 2013-07-13 ENCOUNTER — Other Ambulatory Visit: Payer: Self-pay | Admitting: Internal Medicine

## 2013-07-13 DIAGNOSIS — R197 Diarrhea, unspecified: Secondary | ICD-10-CM | POA: Insufficient documentation

## 2013-07-13 DIAGNOSIS — R9389 Abnormal findings on diagnostic imaging of other specified body structures: Secondary | ICD-10-CM

## 2013-07-13 NOTE — Assessment & Plan Note (Addendum)
Pt evaluated 06/19/13.  Diagnosed with pneumonia.  Treated.  No increased cough or chest congestion.  Some clearing of his throat.  Some nausea and diarrhea.  Repeat cxr revealed persistent changes on xray.  Hold on further abx.  Treat with zantac as directed.  Start align q day.  Check C. Diff.  Schedule chest CT.

## 2013-07-13 NOTE — Progress Notes (Signed)
Subjective:    Patient ID: Zachary Buck, male    DOB: 12/18/1940, 73 y.o.   MRN: [id number]  HPI 73 year old male with past history of CAD s/p MI (75% lesion mid Cfx) s/p stent, hypertension, hypercholesterolemia and diabetes.  He comes in today as a work in with concerns regarding persistent clearing of his throat.  He was evaluated 06/19/13.  Diagnosed with pneumonia.  Treated.  Symptoms improved.  No symptoms now when he takes a deep breath.  No chest congestion or cough.  No sob.  Does report diarrhea for the last two weeks.  No abdominal pain or cramping.  no blood in his stool.  He also reports a minimal sore throat and clearing of his throat.  No known fever.  Eating and drinking.  No vomiting.  Does report some nausea.   Not doing regular exercise.  No chest pain or tightness.  Breathing stable.  Bowels stable.      Past Medical History  Diagnosis Date  . Hypercholesterolemia   . Atherosclerotic heart disease     s/p MI, s/p stent mid circumflex  . Hypertension   . Diabetes mellitus without complication   . Clavicle fracture   . Leg fracture     Current Outpatient Prescriptions on File Prior to Visit  Medication Sig Dispense Refill  . aspirin 81 MG tablet Take 81 mg by mouth daily.      . Cholecalciferol (VITAMIN D-3) 1000 UNITS CAPS Take by mouth.      . Coenzyme Q10 (COQ-10) 100 MG CAPS Take by mouth daily.      Marland Kitchen glucose blood (BAYER CONTOUR NEXT TEST) test strip Contour Next EZ test strips: check blood sugars twice a day. Dx: 250.00  100 each  12  . levocarnitine (CARNITOR) 250 MG capsule Take 250 mg by mouth daily.      Marland Kitchen lisinopril (PRINIVIL,ZESTRIL) 20 MG tablet Take 20 mg by mouth daily.      . metFORMIN (GLUCOPHAGE) 1000 MG tablet Take 1,500 mg by mouth 2 (two) times daily with a meal.      . Omega-3 Fatty Acids (FISH OIL) 1000 MG CAPS Take 3 capsules by mouth daily.      . rosuvastatin (CRESTOR) 20 MG tablet Take 20 mg by mouth daily.      . vitamin E  (VITAMIN E) 400 UNIT capsule Take 400 Units by mouth daily.      . vitamin k 100 MCG tablet Take 100 mcg by mouth daily.       No current facility-administered medications on file prior to visit.    Review of Systems Patient denies any headache, lightheadedness or dizziness. No sinus or allergy symptoms.   No chest pain, tightness or palpitations.  No increased shortness of breath, cough or congestion.  Does report some clearing of his throat, but denies chest congestion.  No acid reflux.  No vomiting.  Some nausea.  No abdominal pain or cramping.  Does report some diarrhea.  Eating and drinking.          Objective:   Physical Exam  Filed Vitals:   07/11/13 1207  BP: 110/70  Pulse: 77  Temp: 98 F (65.43 C)   73 year old male in no acute distress.  HEENT:  Nares - clear.  Oropharynx - without lesions. NECK:  Supple.  Nontender.  No audible carotid bruit.  HEART:  Appears to be regular.   LUNGS:  No crackles or wheezing audible.  Respirations  even and unlabored.   RADIAL PULSE:  Equal bilaterally.  ABDOMEN:  Soft.  Nontender.  Bowel sounds present and normal.  No audible abdominal bruit.    EXTREMITIES:  No increased edema present.  DP pulses palpable and equal bilaterally.      Assessment & Plan:  HEALTH MAINTENANCE.  Physical 10/09/12.    Prostate checks and PSAs through Dr Jacqlyn Larsen.  GI as outlined.  Declines pneumovax/prevnar.

## 2013-07-13 NOTE — Assessment & Plan Note (Signed)
Persistent diarrhea.  Start align daily.  Check C. Diff.

## 2013-07-13 NOTE — Progress Notes (Signed)
Order placed for chest CT 

## 2013-07-15 LAB — HM DIABETES EYE EXAM

## 2013-07-18 ENCOUNTER — Other Ambulatory Visit: Payer: Self-pay | Admitting: Internal Medicine

## 2013-07-18 LAB — CLOSTRIDIUM DIFFICILE(ARMC)

## 2013-07-28 ENCOUNTER — Encounter: Payer: Self-pay | Admitting: Internal Medicine

## 2013-07-29 ENCOUNTER — Encounter: Payer: Self-pay | Admitting: Adult Health

## 2013-07-29 ENCOUNTER — Ambulatory Visit (INDEPENDENT_AMBULATORY_CARE_PROVIDER_SITE_OTHER): Payer: Commercial Managed Care - HMO | Admitting: Adult Health

## 2013-07-29 VITALS — BP 128/72 | HR 60 | Temp 98.1°F | Resp 14 | Wt 199.8 lb

## 2013-07-29 DIAGNOSIS — R197 Diarrhea, unspecified: Secondary | ICD-10-CM

## 2013-07-29 NOTE — Progress Notes (Signed)
Pre visit review using our clinic review tool, if applicable. No additional management support is needed unless otherwise documented below in the visit note. 

## 2013-07-29 NOTE — Progress Notes (Signed)
Patient ID: Zachary Buck, male   DOB: [birth year], 73 y.o.   MRN: [id number]    Subjective:    Patient ID: Zachary Buck, male    DOB: 08-30-1940, 73 y.o.   MRN: [id number]  HPI Pt is a pleasant 73 y/o male who presents to clinic with diarrhea x 3 weeks. Checked for c diff and was negative. He has not seen any blood in his stool. He reports recent antibiotic treatment for pneumonia. No fever, chills. He does not feel ill. Reports that diarrhea occurs shortly after eating anything.    Past Medical History  Diagnosis Date  . Hypercholesterolemia   . Atherosclerotic heart disease     s/p MI, s/p stent mid circumflex  . Hypertension   . Diabetes mellitus without complication   . Clavicle fracture   . Leg fracture     Current Outpatient Prescriptions on File Prior to Visit  Medication Sig Dispense Refill  . aspirin 81 MG tablet Take 81 mg by mouth daily.      . Cholecalciferol (VITAMIN D-3) 1000 UNITS CAPS Take by mouth.      . Coenzyme Q10 (COQ-10) 100 MG CAPS Take by mouth daily.      Marland Kitchen glucose blood (BAYER CONTOUR NEXT TEST) test strip Contour Next EZ test strips: check blood sugars twice a day. Dx: 250.00  100 each  12  . levocarnitine (CARNITOR) 250 MG capsule Take 250 mg by mouth daily.      Marland Kitchen lisinopril (PRINIVIL,ZESTRIL) 20 MG tablet Take 20 mg by mouth daily.      . metFORMIN (GLUCOPHAGE) 1000 MG tablet Take 1,500 mg by mouth 2 (two) times daily with a meal.      . Omega-3 Fatty Acids (FISH OIL) 1000 MG CAPS Take 3 capsules by mouth daily.      . rosuvastatin (CRESTOR) 20 MG tablet Take 20 mg by mouth daily.      . vitamin E (VITAMIN E) 400 UNIT capsule Take 400 Units by mouth daily.      . vitamin k 100 MCG tablet Take 100 mcg by mouth daily.       No current facility-administered medications on file prior to visit.     Review of Systems  Constitutional: Negative for fever and chills.  Respiratory: Negative.   Gastrointestinal: Positive for diarrhea.  Negative for vomiting, abdominal pain, constipation and blood in stool. Nausea: mild nausea occasionally   Neurological: Negative.        Objective:  BP 128/72  Pulse 60  Temp(Src) 98.1 F (36.7 C) (Oral)  Resp 14  Wt 199 lb 12 oz (90.606 kg)  SpO2 96%   Physical Exam  Constitutional: He is oriented to person, place, and time. He appears well-developed and well-nourished. No distress.  Cardiovascular: Normal rate and regular rhythm.   Pulmonary/Chest: Effort normal. No respiratory distress.  Abdominal: Soft. He exhibits no distension and no mass. There is no tenderness. There is no rebound and no guarding.  Hyperactive bowel sounds throughout  Musculoskeletal: Normal range of motion.  Neurological: He is alert and oriented to person, place, and time.  Psychiatric: He has a normal mood and affect. His behavior is normal. Judgment and thought content normal.      Assessment & Plan:   1. Diarrhea Ongoing diarrhea x 3 week s/p antibiotic tx for pna. Had elevated wbc so will recheck this. Will also check for abnormal electrolytes and check kidney fx. Check c diff again. Also check stool for  culture, ova/parasite. No imodium until results obtained.  - CBC with Differential - Comprehensive metabolic panel - Stool culture - Ova and parasite examination - Clostridium difficile EIA

## 2013-07-30 ENCOUNTER — Encounter: Payer: Self-pay | Admitting: Adult Health

## 2013-07-30 ENCOUNTER — Other Ambulatory Visit: Payer: Self-pay | Admitting: Adult Health

## 2013-07-30 LAB — CBC WITH DIFFERENTIAL/PLATELET
BASOS ABS: 0 10*3/uL (ref 0.0–0.1)
BASOS PCT: 0.4 % (ref 0.0–3.0)
Eosinophils Absolute: 0.3 10*3/uL (ref 0.0–0.7)
Eosinophils Relative: 3.4 % (ref 0.0–5.0)
HCT: 38.2 % — ABNORMAL LOW (ref 39.0–52.0)
HEMOGLOBIN: 12.8 g/dL — AB (ref 13.0–17.0)
LYMPHS PCT: 25 % (ref 12.0–46.0)
Lymphs Abs: 2.1 10*3/uL (ref 0.7–4.0)
MCHC: 33.5 g/dL (ref 30.0–36.0)
MCV: 85.3 fl (ref 78.0–100.0)
MONO ABS: 0.8 10*3/uL (ref 0.1–1.0)
Monocytes Relative: 9.9 % (ref 3.0–12.0)
NEUTROS ABS: 5.2 10*3/uL (ref 1.4–7.7)
Neutrophils Relative %: 61.3 % (ref 43.0–77.0)
Platelets: 179 10*3/uL (ref 150.0–400.0)
RBC: 4.48 Mil/uL (ref 4.22–5.81)
RDW: 14.6 % (ref 11.5–15.5)
WBC: 8.4 10*3/uL (ref 4.0–10.5)

## 2013-07-30 LAB — COMPREHENSIVE METABOLIC PANEL
ALK PHOS: 39 U/L (ref 39–117)
ALT: 28 U/L (ref 0–53)
AST: 29 U/L (ref 0–37)
Albumin: 3.6 g/dL (ref 3.5–5.2)
BILIRUBIN TOTAL: 0.6 mg/dL (ref 0.2–1.2)
BUN: 18 mg/dL (ref 6–23)
CO2: 25 mEq/L (ref 19–32)
CREATININE: 1.1 mg/dL (ref 0.4–1.5)
Calcium: 9.5 mg/dL (ref 8.4–10.5)
Chloride: 104 mEq/L (ref 96–112)
GFR: 71.91 mL/min (ref >60.00)
Glucose, Bld: 127 mg/dL — ABNORMAL HIGH (ref 70–99)
Potassium: 4.2 mEq/L (ref 3.5–5.1)
Sodium: 136 mEq/L (ref 135–145)
Total Protein: 7.6 g/dL (ref 6.0–8.3)

## 2013-07-31 ENCOUNTER — Encounter: Payer: Self-pay | Admitting: Adult Health

## 2013-07-31 LAB — OVA AND PARASITE EXAMINATION: OP: NONE SEEN

## 2013-07-31 LAB — C. DIFFICILE GDH AND TOXIN A/B
C. difficile GDH: NOT DETECTED
C. difficile Toxin A/B: NOT DETECTED

## 2013-08-03 LAB — STOOL CULTURE

## 2013-08-04 ENCOUNTER — Encounter: Payer: Self-pay | Admitting: Adult Health

## 2013-08-04 ENCOUNTER — Encounter: Payer: Self-pay | Admitting: Internal Medicine

## 2013-08-15 ENCOUNTER — Ambulatory Visit: Payer: Self-pay | Admitting: Urology

## 2013-08-15 DIAGNOSIS — R972 Elevated prostate specific antigen [PSA]: Secondary | ICD-10-CM | POA: Insufficient documentation

## 2013-08-18 ENCOUNTER — Encounter: Payer: Self-pay | Admitting: Internal Medicine

## 2013-08-20 ENCOUNTER — Ambulatory Visit: Payer: Self-pay | Admitting: Internal Medicine

## 2013-08-25 ENCOUNTER — Encounter: Payer: Self-pay | Admitting: Internal Medicine

## 2013-08-27 ENCOUNTER — Telehealth: Payer: Self-pay | Admitting: Internal Medicine

## 2013-08-27 DIAGNOSIS — R109 Unspecified abdominal pain: Secondary | ICD-10-CM

## 2013-08-27 DIAGNOSIS — R197 Diarrhea, unspecified: Secondary | ICD-10-CM

## 2013-08-27 NOTE — Telephone Encounter (Signed)
See pts my chart message.  Order placed for referral to GI.

## 2013-09-10 ENCOUNTER — Encounter: Payer: Self-pay | Admitting: Internal Medicine

## 2013-09-27 DIAGNOSIS — G5690 Unspecified mononeuropathy of unspecified upper limb: Secondary | ICD-10-CM | POA: Insufficient documentation

## 2013-10-15 ENCOUNTER — Ambulatory Visit (INDEPENDENT_AMBULATORY_CARE_PROVIDER_SITE_OTHER): Payer: Commercial Managed Care - HMO | Admitting: Internal Medicine

## 2013-10-15 ENCOUNTER — Other Ambulatory Visit: Payer: Self-pay | Admitting: Internal Medicine

## 2013-10-15 ENCOUNTER — Encounter: Payer: Self-pay | Admitting: Internal Medicine

## 2013-10-15 VITALS — BP 130/70 | HR 55 | Temp 98.1°F | Ht 69.25 in | Wt 198.8 lb

## 2013-10-15 DIAGNOSIS — R2232 Localized swelling, mass and lump, left upper limb: Secondary | ICD-10-CM

## 2013-10-15 DIAGNOSIS — R1084 Generalized abdominal pain: Secondary | ICD-10-CM

## 2013-10-15 DIAGNOSIS — D649 Anemia, unspecified: Secondary | ICD-10-CM

## 2013-10-15 DIAGNOSIS — Z23 Encounter for immunization: Secondary | ICD-10-CM

## 2013-10-15 DIAGNOSIS — R2230 Localized swelling, mass and lump, unspecified upper limb: Secondary | ICD-10-CM | POA: Insufficient documentation

## 2013-10-15 DIAGNOSIS — I2510 Atherosclerotic heart disease of native coronary artery without angina pectoris: Secondary | ICD-10-CM

## 2013-10-15 DIAGNOSIS — E119 Type 2 diabetes mellitus without complications: Secondary | ICD-10-CM

## 2013-10-15 DIAGNOSIS — R229 Localized swelling, mass and lump, unspecified: Secondary | ICD-10-CM

## 2013-10-15 DIAGNOSIS — R109 Unspecified abdominal pain: Secondary | ICD-10-CM | POA: Insufficient documentation

## 2013-10-15 DIAGNOSIS — J189 Pneumonia, unspecified organism: Secondary | ICD-10-CM

## 2013-10-15 DIAGNOSIS — R197 Diarrhea, unspecified: Secondary | ICD-10-CM

## 2013-10-15 DIAGNOSIS — I10 Essential (primary) hypertension: Secondary | ICD-10-CM

## 2013-10-15 DIAGNOSIS — Z860100 Personal history of colon polyps, unspecified: Secondary | ICD-10-CM

## 2013-10-15 DIAGNOSIS — E7800 Pure hypercholesterolemia, unspecified: Secondary | ICD-10-CM

## 2013-10-15 LAB — BASIC METABOLIC PANEL
BUN: 15 mg/dL (ref 6–23)
CO2: 28 mEq/L (ref 19–32)
CREATININE: 1.1 mg/dL (ref 0.4–1.5)
Calcium: 9.1 mg/dL (ref 8.4–10.5)
Chloride: 102 mEq/L (ref 96–112)
GFR: 68.18 mL/min (ref >60.00)
GLUCOSE: 184 mg/dL — AB (ref 70–99)
POTASSIUM: 4.2 meq/L (ref 3.5–5.1)
Sodium: 136 mEq/L (ref 135–145)

## 2013-10-15 LAB — CBC WITH DIFFERENTIAL/PLATELET
Basophils Absolute: 0 10*3/uL (ref 0.0–0.1)
Basophils Relative: 0.4 % (ref 0.0–3.0)
EOS PCT: 5 % (ref 0.0–5.0)
Eosinophils Absolute: 0.3 10*3/uL (ref 0.0–0.7)
HCT: 37.3 % — ABNORMAL LOW (ref 39.0–52.0)
Hemoglobin: 12.5 g/dL — ABNORMAL LOW (ref 13.0–17.0)
LYMPHS PCT: 22.2 % (ref 12.0–46.0)
Lymphs Abs: 1.5 10*3/uL (ref 0.7–4.0)
MCHC: 33.4 g/dL (ref 30.0–36.0)
MCV: 84.1 fl (ref 78.0–100.0)
Monocytes Absolute: 0.8 10*3/uL (ref 0.1–1.0)
Monocytes Relative: 12.6 % — ABNORMAL HIGH (ref 3.0–12.0)
Neutro Abs: 4 10*3/uL (ref 1.4–7.7)
Neutrophils Relative %: 59.8 % (ref 43.0–77.0)
Platelets: 147 10*3/uL — ABNORMAL LOW (ref 150.0–400.0)
RBC: 4.44 Mil/uL (ref 4.22–5.81)
RDW: 14.2 % (ref 11.5–15.5)
WBC: 6.7 10*3/uL (ref 4.0–10.5)

## 2013-10-15 LAB — HEPATIC FUNCTION PANEL
ALT: 25 U/L (ref 0–53)
AST: 28 U/L (ref 0–37)
Albumin: 3.3 g/dL — ABNORMAL LOW (ref 3.5–5.2)
Alkaline Phosphatase: 47 U/L (ref 39–117)
BILIRUBIN DIRECT: 0.1 mg/dL (ref 0.0–0.3)
BILIRUBIN TOTAL: 0.6 mg/dL (ref 0.2–1.2)
Total Protein: 7.8 g/dL (ref 6.0–8.3)

## 2013-10-15 LAB — HEMOGLOBIN A1C: Hgb A1c MFr Bld: 8.4 % — ABNORMAL HIGH (ref 4.6–6.5)

## 2013-10-15 LAB — MICROALBUMIN / CREATININE URINE RATIO
CREATININE, U: 152.2 mg/dL
MICROALB/CREAT RATIO: 3.1 mg/g (ref 0.0–30.0)
Microalb, Ur: 4.7 mg/dL — ABNORMAL HIGH (ref 0.0–1.9)

## 2013-10-15 LAB — LIPID PANEL
CHOL/HDL RATIO: 7
Cholesterol: 200 mg/dL (ref 0–200)
HDL: 27.7 mg/dL — ABNORMAL LOW (ref >39.00)
NonHDL: 172.3
Triglycerides: 344 mg/dL — ABNORMAL HIGH (ref 0.0–149.0)
VLDL: 68.8 mg/dL — AB (ref 0.0–40.0)

## 2013-10-15 LAB — AMYLASE: Amylase: 65 U/L (ref 27–131)

## 2013-10-15 LAB — LDL CHOLESTEROL, DIRECT: LDL DIRECT: 108.8 mg/dL

## 2013-10-15 LAB — LIPASE: Lipase: 31 U/L (ref 11.0–59.0)

## 2013-10-15 LAB — HM DIABETES FOOT EXAM

## 2013-10-15 NOTE — Assessment & Plan Note (Signed)
Resolved.  Chest CT revealed no evidence of pneumonia.  See report.  Will discuss question of need for f/u.

## 2013-10-15 NOTE — Assessment & Plan Note (Signed)
Blood pressure is doing well.  Same med regimen. Follow.  Follow metabolic panel.

## 2013-10-15 NOTE — Assessment & Plan Note (Addendum)
Has had persistent diarrhea.  Stool studies negative.  Has not responded to probiotics, [facility], etc.  Saw GI.  Planning for colonoscopy in the future.  Given the increased diffuse abdominal pain and persistent diarrhea, I do feel he warrants further testing with CT abdomen/pelvis.  Further w/up pending results.  Pt was instructed to not take the metformin with CT scan.  Directions given.

## 2013-10-15 NOTE — Assessment & Plan Note (Signed)
Last colonoscopy that I have results - 2007.  Hyperplastic polyp.   Persistent diarrhea and abdominal pain.  Evaluated by GI.  Planning for colonoscopy.

## 2013-10-15 NOTE — Assessment & Plan Note (Signed)
No chest pain or tightness with increased activity or exertion.  Stable.  Follow.  Just saw Dr Nehemiah Massed.  Felt stable.

## 2013-10-15 NOTE — Assessment & Plan Note (Signed)
Persistent diarrhea.  Stool studies negative.  Has not responded to probiotics, [facility], etc.  Try kaopectate as directed.  Obtain CT abdomen and pelvis.  Continue f/u with GI.

## 2013-10-15 NOTE — Progress Notes (Signed)
Subjective:    Patient ID: Zachary Buck, male    DOB: [birth year], 73 y.o.   MRN: [id number]  HPI 73 year old male with past history of CAD s/p MI (75% lesion mid Cfx) s/p stent, hypertension, hypercholesterolemia and diabetes.  He comes in today to follow up on these issues as well as for a complete physical exam.   He was evaluated 06/19/13.  Diagnosed with pneumonia.  Treated.  No cough and congestion.  No sob.  Does report diarrhea persists.  Has tried [employer], etc,  Stool studies negative.  Saw GI - Dr Gustavo Lah.  Was planning to have a colonoscopy yesterday.  Had to be cancelled secondary to an emergency.  Has not been rescheduled.   States his diarrhea persists.  Does not occur daily but does occur approximately every 2-3 days.  No blood in his stool.  Minimal nausea.  Is triggered by food.  When it does occur, it is usually after breakfast.   No known fever.  Eating and drinking.  No vomiting.  Does report some minimal nausea.   Also reports some abdominal discomfort.  Brought in no sugar readings.  States am sugars averaging 16 -170.  Not checking pm sugars.       Past Medical History  Diagnosis Date  . Hypercholesterolemia   . Atherosclerotic heart disease     s/p MI, s/p stent mid circumflex  . Hypertension   . Diabetes mellitus without complication   . Clavicle fracture   . Leg fracture     Current Outpatient Prescriptions on File Prior to Visit  Medication Sig Dispense Refill  . aspirin 81 MG tablet Take 81 mg by mouth daily.      . Cholecalciferol (VITAMIN D-3) 1000 UNITS CAPS Take by mouth.      . Coenzyme Q10 (COQ-10) 100 MG CAPS Take by mouth daily.      Marland Kitchen glucose blood (BAYER CONTOUR NEXT TEST) test strip Contour Next EZ test strips: check blood sugars twice a day. Dx: 250.00  100 each  12  . levocarnitine (CARNITOR) 250 MG capsule Take 250 mg by mouth daily.      Marland Kitchen lisinopril (PRINIVIL,ZESTRIL) 20 MG tablet Take 20 mg by mouth daily.      .  metFORMIN (GLUCOPHAGE) 1000 MG tablet Take 1,500 mg by mouth 2 (two) times daily with a meal.      . Omega-3 Fatty Acids (FISH OIL) 1000 MG CAPS Take 3 capsules by mouth daily.      . rosuvastatin (CRESTOR) 20 MG tablet Take 20 mg by mouth daily.      . vitamin E (VITAMIN E) 400 UNIT capsule Take 400 Units by mouth daily.      . vitamin k 100 MCG tablet Take 100 mcg by mouth daily.       No current facility-administered medications on file prior to visit.    Review of Systems Patient denies any headache, lightheadedness or dizziness.  No sinus or allergy symptoms.   No chest pain, tightness or palpitations.  No increased shortness of breath, cough or congestion.   No acid reflux.  No vomiting.  Some nausea.  Abdominal discomfort as outlined.  Diarrhea as outlined.  Persistent.   Eating and drinking.  No fever.         Objective:   Physical Exam  Filed Vitals:   10/15/13 0832  BP: 130/70  Pulse: 55  Temp: 98.1 F (81.40 C)   73 year old  male in no acute distress.  HEENT:  Nares - clear.  Oropharynx - without lesions. NECK:  Supple.  Nontender.  No audible carotid bruit.  HEART:  Appears to be regular.   LUNGS:  No crackles or wheezing audible.  Respirations even and unlabored.   RADIAL PULSE:  Equal bilaterally.  ABDOMEN:  Soft.   Bowel sounds present and normal.  No audible abdominal bruit.  Tenderness to palpation - diffuse - including lower abdomen and pelvic region.  GU:  Performed by Dr Jacqlyn Larsen.    EXTREMITIES:  No increased edema present.  DP pulses palpable and equal bilaterally.  FEET:  No lesions.       Assessment & Plan:  HEALTH MAINTENANCE.  Physical today.    Prostate checks and PSAs through Dr Jacqlyn Larsen.  GI as outlined.  Has declined  Pneumovax/prevnar previously.  Flu shot given today.    I spent 25 minutes with the patient and more than 50% of the time was spent in consultation regarding the above.

## 2013-10-15 NOTE — Assessment & Plan Note (Signed)
Pt has f/u at [facility] regarding the arm mass.

## 2013-10-15 NOTE — Assessment & Plan Note (Signed)
Have discussed the importance of diet and exercise. Discussed the need to record sugars for review.  Sugars per his report as outlined.  Continue same medication for now.  We discussed the possibility of metformin contributing to diarrhea.  He has been on metformin for a long time now.  Diarrhea just started a few months ago.  Diarrhea - not daily and with the associated pain, I do not feel related to the metformin.  Offered to change medication, just to see if would notice improvement.  He wants to continue metformin for now.  Check met b and a1c.  States just had eyes checked in spring 2015 - no diabetes.

## 2013-10-15 NOTE — Progress Notes (Signed)
Pre visit review using our clinic review tool, if applicable. No additional management support is needed unless otherwise documented below in the visit note. 

## 2013-10-15 NOTE — Progress Notes (Signed)
CT without contrast ordered.  Cancel CT with contrast.

## 2013-10-15 NOTE — Assessment & Plan Note (Signed)
On previous check hgb slightly decreased.  Iron studies wnl.  Follow.  Recheck today.

## 2013-10-15 NOTE — Assessment & Plan Note (Signed)
On Crestor.  Low cholesterol diet and exercise.  Follow lipid panel and liver function.

## 2013-10-16 ENCOUNTER — Encounter: Payer: Self-pay | Admitting: *Deleted

## 2013-10-16 ENCOUNTER — Other Ambulatory Visit: Payer: Self-pay | Admitting: Internal Medicine

## 2013-10-16 DIAGNOSIS — D649 Anemia, unspecified: Secondary | ICD-10-CM

## 2013-10-16 NOTE — Progress Notes (Signed)
Orders placed for f/u labs.  

## 2013-10-17 ENCOUNTER — Ambulatory Visit: Payer: Self-pay | Admitting: Internal Medicine

## 2013-10-22 ENCOUNTER — Telehealth: Payer: Self-pay | Admitting: Internal Medicine

## 2013-10-22 NOTE — Telephone Encounter (Signed)
See below

## 2013-10-22 NOTE — Telephone Encounter (Signed)
Pt called in and cancelled appt for 9/4 for labwork and 9/10 for office visit with Dr.Scott and said he will be out of town but will be back the 13th of September. Please advise where to put him and he was wondering if he should keep his 10/28 visit as well.

## 2013-10-22 NOTE — Telephone Encounter (Signed)
Called pt and clarified.  He was off metformin for CT.  Desires not to take cholesterol medication.

## 2013-10-23 NOTE — Telephone Encounter (Signed)
Please put him in on 9/17 at 10:00.  Block 30 minutes.  Thanks.

## 2013-10-23 NOTE — Telephone Encounter (Signed)
Please advise where to schedule patient after 11/02/13. No slots available.

## 2013-10-23 NOTE — Telephone Encounter (Signed)
Pt notified of appt & lab appt via mychart

## 2013-10-24 ENCOUNTER — Other Ambulatory Visit: Payer: Commercial Managed Care - HMO

## 2013-10-30 ENCOUNTER — Ambulatory Visit: Payer: Commercial Managed Care - HMO | Admitting: Internal Medicine

## 2013-11-03 ENCOUNTER — Other Ambulatory Visit: Payer: Commercial Managed Care - HMO

## 2013-11-05 ENCOUNTER — Encounter: Payer: Self-pay | Admitting: Internal Medicine

## 2013-11-06 ENCOUNTER — Encounter: Payer: Self-pay | Admitting: Internal Medicine

## 2013-11-06 ENCOUNTER — Ambulatory Visit (INDEPENDENT_AMBULATORY_CARE_PROVIDER_SITE_OTHER): Payer: Commercial Managed Care - HMO | Admitting: Internal Medicine

## 2013-11-06 VITALS — BP 140/70 | HR 61 | Temp 98.2°F | Ht 69.25 in | Wt 194.5 lb

## 2013-11-06 DIAGNOSIS — E119 Type 2 diabetes mellitus without complications: Secondary | ICD-10-CM

## 2013-11-06 DIAGNOSIS — D649 Anemia, unspecified: Secondary | ICD-10-CM

## 2013-11-06 DIAGNOSIS — M79601 Pain in right arm: Secondary | ICD-10-CM

## 2013-11-06 DIAGNOSIS — R2232 Localized swelling, mass and lump, left upper limb: Secondary | ICD-10-CM

## 2013-11-06 DIAGNOSIS — J189 Pneumonia, unspecified organism: Secondary | ICD-10-CM

## 2013-11-06 DIAGNOSIS — Z23 Encounter for immunization: Secondary | ICD-10-CM

## 2013-11-06 DIAGNOSIS — E7800 Pure hypercholesterolemia, unspecified: Secondary | ICD-10-CM

## 2013-11-06 DIAGNOSIS — Z860100 Personal history of colon polyps, unspecified: Secondary | ICD-10-CM

## 2013-11-06 DIAGNOSIS — R197 Diarrhea, unspecified: Secondary | ICD-10-CM

## 2013-11-06 DIAGNOSIS — M79602 Pain in left arm: Secondary | ICD-10-CM

## 2013-11-06 DIAGNOSIS — I10 Essential (primary) hypertension: Secondary | ICD-10-CM

## 2013-11-06 DIAGNOSIS — R1084 Generalized abdominal pain: Secondary | ICD-10-CM

## 2013-11-06 DIAGNOSIS — R229 Localized swelling, mass and lump, unspecified: Secondary | ICD-10-CM

## 2013-11-06 DIAGNOSIS — I2510 Atherosclerotic heart disease of native coronary artery without angina pectoris: Secondary | ICD-10-CM

## 2013-11-06 DIAGNOSIS — M79609 Pain in unspecified limb: Secondary | ICD-10-CM

## 2013-11-06 LAB — VITAMIN B12: Vitamin B-12: 302 pg/mL (ref 211–911)

## 2013-11-06 LAB — BASIC METABOLIC PANEL
BUN: 19 mg/dL (ref 6–23)
CHLORIDE: 105 meq/L (ref 96–112)
CO2: 25 meq/L (ref 19–32)
Calcium: 9.3 mg/dL (ref 8.4–10.5)
Creatinine, Ser: 1.2 mg/dL (ref 0.4–1.5)
GFR: 64.82 mL/min (ref >60.00)
Glucose, Bld: 211 mg/dL — ABNORMAL HIGH (ref 70–99)
Potassium: 4.4 mEq/L (ref 3.5–5.1)
SODIUM: 137 meq/L (ref 135–145)

## 2013-11-06 LAB — CBC WITH DIFFERENTIAL/PLATELET
BASOS PCT: 0.4 % (ref 0.0–3.0)
Basophils Absolute: 0 10*3/uL (ref 0.0–0.1)
EOS ABS: 0.4 10*3/uL (ref 0.0–0.7)
Eosinophils Relative: 6 % — ABNORMAL HIGH (ref 0.0–5.0)
HEMATOCRIT: 37.1 % — AB (ref 39.0–52.0)
HEMOGLOBIN: 12.3 g/dL — AB (ref 13.0–17.0)
LYMPHS ABS: 1.4 10*3/uL (ref 0.7–4.0)
Lymphocytes Relative: 21.3 % (ref 12.0–46.0)
MCHC: 33.3 g/dL (ref 30.0–36.0)
MCV: 83.1 fl (ref 78.0–100.0)
MONO ABS: 0.8 10*3/uL (ref 0.1–1.0)
Monocytes Relative: 11.6 % (ref 3.0–12.0)
Neutro Abs: 4 10*3/uL (ref 1.4–7.7)
Neutrophils Relative %: 60.7 % (ref 43.0–77.0)
Platelets: 142 10*3/uL — ABNORMAL LOW (ref 150.0–400.0)
RBC: 4.46 Mil/uL (ref 4.22–5.81)
RDW: 14.2 % (ref 11.5–15.5)
WBC: 6.7 10*3/uL (ref 4.0–10.5)

## 2013-11-06 LAB — FERRITIN: Ferritin: 18 ng/mL — ABNORMAL LOW (ref 22.0–322.0)

## 2013-11-06 LAB — IBC PANEL
Iron: 41 ug/dL — ABNORMAL LOW (ref 42–165)
SATURATION RATIOS: 11.1 % — AB (ref 20.0–50.0)
Transferrin: 264.5 mg/dL (ref 212.0–360.0)

## 2013-11-06 NOTE — Progress Notes (Signed)
Pre visit review using our clinic review tool, if applicable. No additional management support is needed unless otherwise documented below in the visit note. 

## 2013-11-06 NOTE — Progress Notes (Signed)
Subjective:    Patient ID: Zachary Buck, male    DOB: [birth year], 73 y.o.   MRN: [id number]  HPI 73 year old male with past history of CAD s/p MI (75% lesion mid Cfx) s/p stent, hypertension, hypercholesterolemia and diabetes.  He comes in today for a scheduled follow up.  He was evaluated 06/19/13.  Diagnosed with pneumonia.  Treated.  No cough and congestion.  No sob.  Does report diarrhea persists.  Has tried [employer], etc,  Stool studies negative.  Saw GI - Dr Gustavo Lah.  Planning for colonoscopy and EGD.  Scheduled for the end of the month.   No blood in his stool.   Eating and drinking.  No vomiting.  Still has intermittent episodes of loose stool.  Will skip days.   Also reports some abdominal discomfort.  Brought in no sugar readings.  States sugars are averaging 200.  Just started back on metformin.  Had CT.  Nothing found on CT to explain his symptoms.     Past Medical History  Diagnosis Date  . Hypercholesterolemia   . Atherosclerotic heart disease     s/p MI, s/p stent mid circumflex  . Hypertension   . Diabetes mellitus without complication   . Clavicle fracture   . Leg fracture     Current Outpatient Prescriptions on File Prior to Visit  Medication Sig Dispense Refill  . aspirin 81 MG tablet Take 81 mg by mouth daily.      . Cholecalciferol (VITAMIN D-3) 1000 UNITS CAPS Take by mouth.      . Coenzyme Q10 (COQ-10) 100 MG CAPS Take by mouth daily.      Marland Kitchen glucose blood (BAYER CONTOUR NEXT TEST) test strip Contour Next EZ test strips: check blood sugars twice a day. Dx: 250.00  100 each  12  . levocarnitine (CARNITOR) 250 MG capsule Take 250 mg by mouth daily.      Marland Kitchen lisinopril (PRINIVIL,ZESTRIL) 20 MG tablet Take 20 mg by mouth daily.      . metFORMIN (GLUCOPHAGE) 1000 MG tablet Take 1,500 mg by mouth 2 (two) times daily with a meal.      . Omega-3 Fatty Acids (FISH OIL) 1000 MG CAPS Take 3 capsules by mouth daily.      . vitamin E (VITAMIN E) 400 UNIT  capsule Take 400 Units by mouth daily.      . vitamin k 100 MCG tablet Take 100 mcg by mouth daily.       No current facility-administered medications on file prior to visit.    Review of Systems Patient denies any headache, lightheadedness or dizziness.  No sinus or allergy symptoms.   No chest pain, tightness or palpitations.  No increased shortness of breath, cough or congestion.   No acid reflux.  No vomiting.  Abdominal discomfort as outlined.  Diarrhea as outlined.  Persistent/intermittent.   Eating and drinking.  No fever.         Objective:   Physical Exam  Filed Vitals:   11/06/13 0947  BP: 140/70  Pulse: 61  Temp: 98.2 F (47.40 C)   73 year old male in no acute distress.  HEENT:  Nares - clear.  Oropharynx - without lesions. NECK:  Supple.  Nontender.  No audible carotid bruit.  HEART:  Appears to be regular.   LUNGS:  No crackles or wheezing audible.  Respirations even and unlabored.   RADIAL PULSE:  Equal bilaterally.  ABDOMEN:  Soft.   Bowel  sounds present and normal.  No audible abdominal bruit.  Tenderness to palpation - diffuse - including lower abdomen and pelvic region.  Minimal tenderness.   EXTREMITIES:  No increased edema present.  DP pulses palpable and equal bilaterally.  FEET:  No lesions.       Assessment & Plan:  HEALTH MAINTENANCE.  Physical 10/15/13.    Prostate checks and PSAs through Dr Jacqlyn Larsen.  GI as outlined.  prevnar given.   I spent 25 minutes with the patient and more than 50% of the time was spent in consultation regarding the above.

## 2013-11-09 ENCOUNTER — Encounter: Payer: Self-pay | Admitting: Internal Medicine

## 2013-11-09 ENCOUNTER — Other Ambulatory Visit: Payer: Self-pay | Admitting: Internal Medicine

## 2013-11-09 DIAGNOSIS — I10 Essential (primary) hypertension: Secondary | ICD-10-CM

## 2013-11-09 DIAGNOSIS — E119 Type 2 diabetes mellitus without complications: Secondary | ICD-10-CM

## 2013-11-09 DIAGNOSIS — I2510 Atherosclerotic heart disease of native coronary artery without angina pectoris: Secondary | ICD-10-CM

## 2013-11-09 DIAGNOSIS — E7800 Pure hypercholesterolemia, unspecified: Secondary | ICD-10-CM

## 2013-11-09 DIAGNOSIS — D649 Anemia, unspecified: Secondary | ICD-10-CM

## 2013-11-09 NOTE — Assessment & Plan Note (Signed)
Have discussed the importance of diet and exercise. Discussed the need to record sugars for review.  Sugars per his report as outlined.  Continue same medication for now.  We discussed the possibility of metformin contributing to diarrhea.  He has been on metformin for a long time now.  Diarrhea just started a few months ago.  Diarrhea - not daily and with the associated pain, I do not feel related to the metformin.  Offered to change medication, just to see if would notice improvement.  He wants to continue metformin for now.  Desires no other medication.  Follow met b and a1c.  States just had eyes checked in spring 2015 - no diabetes.

## 2013-11-09 NOTE — Assessment & Plan Note (Signed)
Off Crestor.  Low cholesterol diet and exercise.  Follow lipid panel and liver function.  He declines cholesterol medication.

## 2013-11-09 NOTE — Assessment & Plan Note (Signed)
On previous check hgb slightly decreased.  Iron studies wnl.  Follow.

## 2013-11-09 NOTE — Progress Notes (Signed)
Order placed for f/u labs.  

## 2013-11-09 NOTE — Assessment & Plan Note (Signed)
No chest pain or tightness with increased activity or exertion.  Stable.  Follow.  Sees Dr Nehemiah Massed.  Felt stable.

## 2013-11-09 NOTE — Assessment & Plan Note (Signed)
Hand and arm pain as outlined.  [facility]m [facility]  Had MRI of upper arm and neck previously and just had f/u mri.  States everything checked out fine.  Recommended f/u mri in one year.  Discussed gabapentin.  He declines.  Discussed obtaining nerve conduction studies.  He declines.  He desires no further w/up.  Follow.

## 2013-11-09 NOTE — Assessment & Plan Note (Signed)
Persistent diarrhea.  Stool studies negative.  Has not responded to probiotics, [facility], etc.  Try kaopectate as directed.  CT abdomen and pelvis per report.  Saw GI.  Planning for colonoscopy and EGD.

## 2013-11-09 NOTE — Assessment & Plan Note (Signed)
Resolved.  Chest CT revealed no evidence of pneumonia.  See report.  Plan for f/u CT chest one year after last CT.

## 2013-11-09 NOTE — Assessment & Plan Note (Signed)
Last colonoscopy that I have results - 2007.  Hyperplastic polyp.   Persistent diarrhea and abdominal pain.  Evaluated by GI.  Planning for colonoscopy.

## 2013-11-09 NOTE — Assessment & Plan Note (Signed)
Blood pressure is doing well.  Same med regimen. Follow.  Follow metabolic panel.

## 2013-11-09 NOTE — Assessment & Plan Note (Signed)
CT revealed no abnormality to explain his continued symptoms.  Was referred to GI.  Planning for colonoscopy and EGD.

## 2013-11-09 NOTE — Assessment & Plan Note (Signed)
Pt has f/u at [facility] regarding the arm mass.

## 2013-11-12 ENCOUNTER — Encounter: Payer: Self-pay | Admitting: Internal Medicine

## 2013-11-13 ENCOUNTER — Other Ambulatory Visit: Payer: Self-pay | Admitting: *Deleted

## 2013-11-14 NOTE — Telephone Encounter (Signed)
Mailed unread message to pt  

## 2013-11-18 ENCOUNTER — Ambulatory Visit: Payer: Self-pay | Admitting: Gastroenterology

## 2013-11-18 LAB — CBC
HCT: 40.4 % (ref 40.0–52.0)
HGB: 13.1 g/dL (ref 13.0–18.0)
MCH: 27.3 pg (ref 26.0–34.0)
MCHC: 32.5 g/dL (ref 32.0–36.0)
MCV: 84 fL (ref 80–100)
Platelet: 152 10*3/uL (ref 150–440)
RBC: 4.8 10*6/uL (ref 4.40–5.90)
RDW: 14.9 % — AB (ref 11.5–14.5)
WBC: 6 10*3/uL (ref 3.8–10.6)

## 2013-11-18 LAB — PROTIME-INR
INR: 1
PROTHROMBIN TIME: 13.5 s (ref 11.5–14.7)

## 2013-11-18 LAB — HEPATIC FUNCTION PANEL A (ARMC)
Albumin: 3.3 g/dL — ABNORMAL LOW (ref 3.4–5.0)
Alkaline Phosphatase: 58 U/L
Bilirubin, Direct: 0.1 mg/dL (ref 0.00–0.20)
Bilirubin,Total: 0.6 mg/dL (ref 0.2–1.0)
SGOT(AST): 29 U/L (ref 15–37)
SGPT (ALT): 36 U/L
TOTAL PROTEIN: 8.3 g/dL — AB (ref 6.4–8.2)

## 2013-11-18 LAB — HM COLONOSCOPY: HM Colonoscopy: 4

## 2013-11-26 ENCOUNTER — Encounter: Payer: Self-pay | Admitting: Internal Medicine

## 2013-11-26 LAB — PATHOLOGY REPORT

## 2013-12-03 ENCOUNTER — Other Ambulatory Visit (INDEPENDENT_AMBULATORY_CARE_PROVIDER_SITE_OTHER): Payer: Commercial Managed Care - HMO

## 2013-12-03 DIAGNOSIS — D649 Anemia, unspecified: Secondary | ICD-10-CM

## 2013-12-03 DIAGNOSIS — E119 Type 2 diabetes mellitus without complications: Secondary | ICD-10-CM

## 2013-12-03 LAB — FERRITIN: Ferritin: 20.3 ng/mL — ABNORMAL LOW (ref 22.0–322.0)

## 2013-12-03 LAB — CBC WITH DIFFERENTIAL/PLATELET
BASOS ABS: 0 10*3/uL (ref 0.0–0.1)
Basophils Relative: 0.4 % (ref 0.0–3.0)
EOS PCT: 7.5 % — AB (ref 0.0–5.0)
Eosinophils Absolute: 0.6 10*3/uL (ref 0.0–0.7)
HEMATOCRIT: 37.5 % — AB (ref 39.0–52.0)
HEMOGLOBIN: 12.3 g/dL — AB (ref 13.0–17.0)
Lymphocytes Relative: 23.7 % (ref 12.0–46.0)
Lymphs Abs: 1.8 10*3/uL (ref 0.7–4.0)
MCHC: 32.8 g/dL (ref 30.0–36.0)
MCV: 83.2 fl (ref 78.0–100.0)
MONOS PCT: 9.2 % (ref 3.0–12.0)
Monocytes Absolute: 0.7 10*3/uL (ref 0.1–1.0)
NEUTROS ABS: 4.5 10*3/uL (ref 1.4–7.7)
Neutrophils Relative %: 59.2 % (ref 43.0–77.0)
PLATELETS: 159 10*3/uL (ref 150.0–400.0)
RBC: 4.5 Mil/uL (ref 4.22–5.81)
RDW: 15 % (ref 11.5–15.5)
WBC: 7.7 10*3/uL (ref 4.0–10.5)

## 2013-12-03 LAB — BASIC METABOLIC PANEL
BUN: 20 mg/dL (ref 6–23)
CO2: 25 mEq/L (ref 19–32)
Calcium: 9.6 mg/dL (ref 8.4–10.5)
Chloride: 106 mEq/L (ref 96–112)
Creatinine, Ser: 1.2 mg/dL (ref 0.4–1.5)
GFR: 61.17 mL/min (ref >60.00)
Glucose, Bld: 226 mg/dL — ABNORMAL HIGH (ref 70–99)
POTASSIUM: 4.2 meq/L (ref 3.5–5.1)
Sodium: 137 mEq/L (ref 135–145)

## 2013-12-04 ENCOUNTER — Encounter: Payer: Self-pay | Admitting: Internal Medicine

## 2013-12-08 NOTE — Telephone Encounter (Signed)
Unread mychart message mailed to patient 

## 2013-12-09 ENCOUNTER — Ambulatory Visit: Payer: Self-pay | Admitting: Gastroenterology

## 2013-12-17 ENCOUNTER — Ambulatory Visit (INDEPENDENT_AMBULATORY_CARE_PROVIDER_SITE_OTHER): Payer: Commercial Managed Care - HMO | Admitting: Internal Medicine

## 2013-12-17 ENCOUNTER — Encounter: Payer: Self-pay | Admitting: Internal Medicine

## 2013-12-17 VITALS — BP 120/50 | HR 65 | Temp 98.2°F | Ht 69.25 in | Wt 198.5 lb

## 2013-12-17 DIAGNOSIS — E119 Type 2 diabetes mellitus without complications: Secondary | ICD-10-CM

## 2013-12-17 DIAGNOSIS — R2232 Localized swelling, mass and lump, left upper limb: Secondary | ICD-10-CM

## 2013-12-17 DIAGNOSIS — M79601 Pain in right arm: Secondary | ICD-10-CM

## 2013-12-17 DIAGNOSIS — E7800 Pure hypercholesterolemia, unspecified: Secondary | ICD-10-CM

## 2013-12-17 DIAGNOSIS — I10 Essential (primary) hypertension: Secondary | ICD-10-CM

## 2013-12-17 DIAGNOSIS — I2510 Atherosclerotic heart disease of native coronary artery without angina pectoris: Secondary | ICD-10-CM

## 2013-12-17 DIAGNOSIS — M79602 Pain in left arm: Secondary | ICD-10-CM

## 2013-12-17 DIAGNOSIS — R1084 Generalized abdominal pain: Secondary | ICD-10-CM

## 2013-12-17 DIAGNOSIS — R197 Diarrhea, unspecified: Secondary | ICD-10-CM

## 2013-12-17 DIAGNOSIS — Z86010 Personal history of colonic polyps: Secondary | ICD-10-CM

## 2013-12-17 DIAGNOSIS — D649 Anemia, unspecified: Secondary | ICD-10-CM

## 2013-12-17 MED ORDER — GLIPIZIDE ER 5 MG PO TB24: 5.0000 mg | ORAL_TABLET | Freq: Every day | ORAL | Status: DC

## 2013-12-17 MED ORDER — METFORMIN HCL 1000 MG PO TABS: 1000.0000 mg | ORAL_TABLET | Freq: Two times a day (BID) | ORAL | Status: DC

## 2013-12-17 NOTE — Patient Instructions (Signed)
Take zantac (ranitidine) 150mg  - 30 minutes before breakfast.    Start glipizide (glucotrol) one tablet per day.  Take in the morning.   Decrease metformin to 1000mg  - one tablet 2x/day.

## 2013-12-17 NOTE — Progress Notes (Signed)
Subjective:    Patient ID: Zachary Buck, male    DOB: 08/29/1940, 73 y.o.   MRN: [id number]  HPI 73 year old male with past history of CAD s/p MI (75% lesion mid Cfx) s/p stent, hypertension, hypercholesterolemia and diabetes.  He comes in today for a scheduled follow up.  Does report diarrhea persists.  Has tried [employer], etc,  Stool studies negative.  Saw GI - Dr Gustavo Lah.  Had colonoscopy with multiple polyps removed.  Had EGD that revealed gastritis.  No varices present.  SBFT negative.  Still has persistent flares with his bowels.  Persistent loose stool.   No blood in his stool.   Eating and drinking.  No vomiting.   Also reports some abdominal discomfort.  Discussed with him today.  desires no further testing or intervention.  Sugars still elevated.  Some fasting readings 170-220.  Back on metformin.  Hands are better.  Still some pain.  Desires no further w/up for this.  No chest pain or tightness.  He is not taking crestor.  Desires not to take cholesterol medication.  Also off his lisinopril.      Past Medical History  Diagnosis Date  . Hypercholesterolemia   . Atherosclerotic heart disease     s/p MI, s/p stent mid circumflex  . Hypertension   . Diabetes mellitus without complication   . Clavicle fracture   . Leg fracture     Current Outpatient Prescriptions on File Prior to Visit  Medication Sig Dispense Refill  . aspirin 81 MG tablet Take 81 mg by mouth daily.      . Cholecalciferol (VITAMIN D-3) 1000 UNITS CAPS Take by mouth.      . Coenzyme Q10 (COQ-10) 100 MG CAPS Take by mouth daily.      Marland Kitchen glucose blood (BAYER CONTOUR NEXT TEST) test strip Contour Next EZ test strips: check blood sugars twice a day. Dx: 250.00  100 each  12  . levocarnitine (CARNITOR) 250 MG capsule Take 250 mg by mouth daily.      Marland Kitchen lisinopril (PRINIVIL,ZESTRIL) 20 MG tablet Take 20 mg by mouth daily.      . metFORMIN (GLUCOPHAGE) 1000 MG tablet Take 1,500 mg by mouth 2 (two)  times daily with a meal.      . Omega-3 Fatty Acids (FISH OIL) 1000 MG CAPS Take 3 capsules by mouth daily.      . vitamin E (VITAMIN E) 400 UNIT capsule Take 400 Units by mouth daily.      . vitamin k 100 MCG tablet Take 100 mcg by mouth daily.       No current facility-administered medications on file prior to visit.    Review of Systems Patient denies any headache, lightheadedness or dizziness.  No sinus or allergy symptoms.   No chest pain, tightness or palpitations.  No increased shortness of breath, cough or congestion.   No acid reflux.  No vomiting.  Abdominal discomfort as outlined.  Diarrhea as outlined.  Persistent/intermittent.   Eating and drinking.  No fever.         Objective:   Physical Exam  Filed Vitals:   12/17/13 0830  BP: 120/50  Pulse: 65  Temp: 98.2 F (36.8 C)   Blood pressure recheck:  24/60  73 year old male in no acute distress.  HEENT:  Nares - clear.  Oropharynx - without lesions. NECK:  Supple.  Nontender.  No audible carotid bruit.  HEART:  Appears to be regular.  LUNGS:  No crackles or wheezing audible.  Respirations even and unlabored.   RADIAL PULSE:  Equal bilaterally.  ABDOMEN:  Soft.   Bowel sounds present and normal.  No audible abdominal bruit.  Tenderness to palpation - diffuse - including lower abdomen and pelvic region.  Minimal tenderness.   EXTREMITIES:  No increased edema present.  DP pulses palpable and equal bilaterally.  FEET:  No lesions.       Assessment & Plan:  1. Coronary artery disease involving native coronary artery of native heart without angina pectoris Stable.  Continue risk factor modification.  Continues f/u with cardiology.   2. Essential hypertension, benign Blood pressure doing well.  On no medication.    3. Type 2 diabetes mellitus without complication Sugars elevated.  Will decrease metformin to 1000mg  bid.  Add glucotrol XL 5mg  q day.  Follow sugars.  Get him back in soon to reassess.  Keep up to date with  eye checks.  Lab Results  Component Value Date   HGBA1C 8.4* 10/15/2013   4. Hypercholesterolemia He is off crestor.  Discussed with him regarding the importance of treating cholesterol, especially given his known history of CAD.  He declines.    5. History of colonic polyps Colonoscopy 11/18/13 with four polyps removed, non bleeding internal hemorrhoids.  Continue to f/u with GI.    6. Anemia, unspecified anemia type Hgb stabe 12/3 (12/03/13).  Just had colonoscopy, EGD and SBFT.  Follow.   7. Bilateral arm pain Persistent.  Desires no further w/up.    8. Diarrhea Persistent intermittent flares.  W/up as outlined above.  Has also had CT.  Adjust metformin.  He has stopped metformin and it did not make a difference.  He desires no further testing or evaluation.    9. Generalized abdominal pain Persistent.  Had CT and colonoscopy with SBFT.  No clear etiology.  Desires no further testing.    10. Arm mass, left He has f/u at [facility] regarding the mass.    11.  ABNORMAL CHEST CT.  Recommended f/u ct in one year after last.    HEALTH MAINTENANCE.  Physical 10/15/13.    Prostate checks and PSAs through Dr Jacqlyn Larsen.  GI as outlined.  prevnar given.   I spent 25 minutes with the patient and more than 50% of the time was spent in consultation regarding the above.

## 2013-12-17 NOTE — Progress Notes (Signed)
Pre visit review using our clinic review tool, if applicable. No additional management support is needed unless otherwise documented below in the visit note. 

## 2013-12-22 ENCOUNTER — Encounter: Payer: Self-pay | Admitting: Internal Medicine

## 2014-03-02 ENCOUNTER — Other Ambulatory Visit (INDEPENDENT_AMBULATORY_CARE_PROVIDER_SITE_OTHER): Payer: PPO

## 2014-03-02 ENCOUNTER — Telehealth: Payer: Self-pay | Admitting: *Deleted

## 2014-03-02 DIAGNOSIS — D649 Anemia, unspecified: Secondary | ICD-10-CM

## 2014-03-02 DIAGNOSIS — Z860100 Personal history of colon polyps, unspecified: Secondary | ICD-10-CM

## 2014-03-02 DIAGNOSIS — R197 Diarrhea, unspecified: Secondary | ICD-10-CM

## 2014-03-02 DIAGNOSIS — E119 Type 2 diabetes mellitus without complications: Secondary | ICD-10-CM

## 2014-03-02 DIAGNOSIS — E7800 Pure hypercholesterolemia, unspecified: Secondary | ICD-10-CM

## 2014-03-02 DIAGNOSIS — I10 Essential (primary) hypertension: Secondary | ICD-10-CM

## 2014-03-02 DIAGNOSIS — I2510 Atherosclerotic heart disease of native coronary artery without angina pectoris: Secondary | ICD-10-CM

## 2014-03-02 LAB — BASIC METABOLIC PANEL
BUN: 25 mg/dL — ABNORMAL HIGH (ref 6–23)
CHLORIDE: 106 meq/L (ref 96–112)
CO2: 24 mEq/L (ref 19–32)
Calcium: 9.5 mg/dL (ref 8.4–10.5)
Creatinine, Ser: 1.3 mg/dL (ref 0.4–1.5)
GFR: 57.35 mL/min — AB (ref >60.00)
GLUCOSE: 224 mg/dL — AB (ref 70–99)
Potassium: 4.7 mEq/L (ref 3.5–5.1)
SODIUM: 138 meq/L (ref 135–145)

## 2014-03-02 LAB — CBC WITH DIFFERENTIAL/PLATELET
BASOS ABS: 0.1 10*3/uL (ref 0.0–0.1)
Basophils Relative: 0.8 % (ref 0.0–3.0)
Eosinophils Absolute: 1 10*3/uL — ABNORMAL HIGH (ref 0.0–0.7)
Eosinophils Relative: 9.7 % — ABNORMAL HIGH (ref 0.0–5.0)
HCT: 37.9 % — ABNORMAL LOW (ref 39.0–52.0)
Hemoglobin: 12.4 g/dL — ABNORMAL LOW (ref 13.0–17.0)
LYMPHS PCT: 22 % (ref 12.0–46.0)
Lymphs Abs: 2.2 10*3/uL (ref 0.7–4.0)
MCHC: 32.7 g/dL (ref 30.0–36.0)
MCV: 84.6 fl (ref 78.0–100.0)
Monocytes Absolute: 1.2 10*3/uL — ABNORMAL HIGH (ref 0.1–1.0)
Monocytes Relative: 11.8 % (ref 3.0–12.0)
NEUTROS ABS: 5.7 10*3/uL (ref 1.4–7.7)
NEUTROS PCT: 55.7 % (ref 43.0–77.0)
Platelets: 169 10*3/uL (ref 150.0–400.0)
RBC: 4.48 Mil/uL (ref 4.22–5.81)
RDW: 15.6 % — AB (ref 11.5–15.5)
WBC: 10.2 10*3/uL (ref 4.0–10.5)

## 2014-03-02 LAB — HEPATIC FUNCTION PANEL
ALT: 35 U/L (ref 0–53)
AST: 33 U/L (ref 0–37)
Albumin: 3.3 g/dL — ABNORMAL LOW (ref 3.5–5.2)
Alkaline Phosphatase: 66 U/L (ref 39–117)
BILIRUBIN TOTAL: 0.9 mg/dL (ref 0.2–1.2)
Total Protein: 7.8 g/dL (ref 6.0–8.3)

## 2014-03-02 LAB — FERRITIN: FERRITIN: 36 ng/mL (ref 22.0–322.0)

## 2014-03-02 LAB — HEMOGLOBIN A1C: Hgb A1c MFr Bld: 8.3 % — ABNORMAL HIGH (ref 4.6–6.5)

## 2014-03-02 NOTE — Telephone Encounter (Signed)
Orders placed for labs

## 2014-03-02 NOTE — Telephone Encounter (Signed)
What labs and dX?  

## 2014-03-03 LAB — LIPID PANEL
CHOL/HDL RATIO: 8
CHOLESTEROL: 172 mg/dL (ref 0–200)
HDL: 22.7 mg/dL — ABNORMAL LOW (ref >39.00)
NonHDL: 149.3
TRIGLYCERIDES: 238 mg/dL — AB (ref 0.0–149.0)
VLDL: 47.6 mg/dL — AB (ref 0.0–40.0)

## 2014-03-03 LAB — LDL CHOLESTEROL, DIRECT: LDL DIRECT: 88.2 mg/dL

## 2014-03-04 ENCOUNTER — Ambulatory Visit (INDEPENDENT_AMBULATORY_CARE_PROVIDER_SITE_OTHER): Payer: PPO | Admitting: Internal Medicine

## 2014-03-04 ENCOUNTER — Encounter: Payer: Self-pay | Admitting: Internal Medicine

## 2014-03-04 VITALS — BP 158/73 | HR 78 | Temp 97.7°F | Ht 69.25 in | Wt 196.5 lb

## 2014-03-04 DIAGNOSIS — R6889 Other general symptoms and signs: Secondary | ICD-10-CM

## 2014-03-04 DIAGNOSIS — R2232 Localized swelling, mass and lump, left upper limb: Secondary | ICD-10-CM

## 2014-03-04 DIAGNOSIS — I2510 Atherosclerotic heart disease of native coronary artery without angina pectoris: Secondary | ICD-10-CM

## 2014-03-04 DIAGNOSIS — R1084 Generalized abdominal pain: Secondary | ICD-10-CM

## 2014-03-04 DIAGNOSIS — R197 Diarrhea, unspecified: Secondary | ICD-10-CM

## 2014-03-04 DIAGNOSIS — D649 Anemia, unspecified: Secondary | ICD-10-CM

## 2014-03-04 DIAGNOSIS — E119 Type 2 diabetes mellitus without complications: Secondary | ICD-10-CM

## 2014-03-04 DIAGNOSIS — E7800 Pure hypercholesterolemia, unspecified: Secondary | ICD-10-CM

## 2014-03-04 DIAGNOSIS — M79601 Pain in right arm: Secondary | ICD-10-CM

## 2014-03-04 DIAGNOSIS — I10 Essential (primary) hypertension: Secondary | ICD-10-CM

## 2014-03-04 DIAGNOSIS — M79602 Pain in left arm: Secondary | ICD-10-CM

## 2014-03-04 DIAGNOSIS — R9389 Abnormal findings on diagnostic imaging of other specified body structures: Secondary | ICD-10-CM

## 2014-03-04 DIAGNOSIS — Z86010 Personal history of colonic polyps: Secondary | ICD-10-CM

## 2014-03-04 DIAGNOSIS — R938 Abnormal findings on diagnostic imaging of other specified body structures: Secondary | ICD-10-CM

## 2014-03-04 MED ORDER — GLIPIZIDE 5 MG PO TABS: 5.0000 mg | ORAL_TABLET | Freq: Two times a day (BID) | ORAL | Status: DC

## 2014-03-04 NOTE — Patient Instructions (Signed)
Saline nasal spray - flush nose at least 2-3x/day  nasacort nasal spray - 2 sprays each nostril one time per day.  Do this in the evening.    mucinex in the am and robitussin in the evening.    Start glipizide 5mg  - one per day

## 2014-03-04 NOTE — Progress Notes (Signed)
Pre visit review using our clinic review tool, if applicable. No additional management support is needed unless otherwise documented below in the visit note. 

## 2014-03-05 ENCOUNTER — Encounter: Payer: Self-pay | Admitting: Internal Medicine

## 2014-03-08 ENCOUNTER — Encounter: Payer: Self-pay | Admitting: Internal Medicine

## 2014-03-08 DIAGNOSIS — R6889 Other general symptoms and signs: Secondary | ICD-10-CM | POA: Insufficient documentation

## 2014-03-08 NOTE — Progress Notes (Signed)
Subjective:    Patient ID: Zachary Buck, male    DOB: 1940/05/03, 74 y.o.   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  He comes in today for a scheduled follow up.  Does report diarrhea is better.  Still occasionally occurs, but better.   Stool studies negative.  Saw GI - Dr Gustavo Lah.  Had colonoscopy with multiple polyps removed.  Had EGD that revealed gastritis.  No varices present.  SBFT negative.  No blood in his stool.   Eating and drinking.  No vomiting.   Also reports some persistent abdominal discomfort.  Discussed with him today.  desires no further testing or intervention.  Sugars still elevated.  Brought in no recorded sugar readings.   On metformin.  Taking glipizide.  Hands are better.  Still some pain.  Desires no further w/up for this.  No chest pain or tightness.  He is not taking his lisinopril regularly.   Also reports some increased drainage and throat congestion.  No chest congestion.       Past Medical History  Diagnosis Date  . Hypercholesterolemia   . Atherosclerotic heart disease     s/p MI, s/p stent mid circumflex  . Hypertension   . Diabetes mellitus without complication   . Clavicle fracture   . Leg fracture     Current Outpatient Prescriptions on File Prior to Visit  Medication Sig Dispense Refill  . aspirin 81 MG tablet Take 81 mg by mouth daily.    . Cholecalciferol (VITAMIN D-3) 1000 UNITS CAPS Take by mouth.    . Coenzyme Q10 (COQ-10) 100 MG CAPS Take by mouth daily.    Marland Kitchen glucose blood (BAYER CONTOUR NEXT TEST) test strip Contour Next EZ test strips: check blood sugars twice a day. Dx: 250.00 100 each 12  . levocarnitine (CARNITOR) 250 MG capsule Take 250 mg by mouth daily.    Marland Kitchen lisinopril (PRINIVIL,ZESTRIL) 20 MG tablet Take 20 mg by mouth daily.    . metFORMIN (GLUCOPHAGE) 1000 MG tablet Take 1 tablet (1,000 mg total) by mouth 2 (two)  times daily with a meal. 60 tablet 2  . Omega-3 Fatty Acids (FISH OIL) 1000 MG CAPS Take 3 capsules by mouth daily.    . vitamin E (VITAMIN E) 400 UNIT capsule Take 400 Units by mouth daily.    . vitamin k 100 MCG tablet Take 100 mcg by mouth daily.     No current facility-administered medications on file prior to visit.    Review of Systems Patient denies any headache, lightheadedness or dizziness.  Does report increased drainage and throat congestion.    No chest pain, tightness or palpitations.  No increased shortness of breath, cough or chest congestion.   No acid reflux.  No vomiting.  Abdominal discomfort as outlined.  Diarrhea as outlined.  Persistent/intermittent.   Eating and drinking.  No fever.  Not taking his lisinopril regularly.         Objective:   Physical Exam  Filed Vitals:   03/04/14 0752  BP: 158/73  Pulse: 78  Temp: 97.7 F (36.5 C)   Blood pressure recheck:  69/19  74 year old male in no acute distress.  HEENT:  Nares - clear.  Oropharynx - without lesions. NECK:  Supple.  Nontender.  No audible carotid bruit.  HEART:  Appears to be regular.   LUNGS:  No crackles or  wheezing audible.  Respirations even and unlabored.   RADIAL PULSE:  Equal bilaterally.  ABDOMEN:  Soft.   Bowel sounds present and normal.  No audible abdominal bruit.  Tenderness to palpation - diffuse - including lower abdomen and pelvic region.  Minimal tenderness.   EXTREMITIES:  No increased edema present.  DP pulses palpable and equal bilaterally.  FEET:  No lesions.       Assessment & Plan:  1. Essential hypertension, benign Blood pressure elevated.  Not taking lisinopril.  Needs  To start.    2. Coronary artery disease involving native coronary artery of native heart without angina pectoris Stable.  Continue risk factor modification.  Seeing cardiology.   3. Type 2 diabetes mellitus without complication Brought in no recorded sugar readings.  Stressed to him the importance of  checking his sugar regularly.  He declines to take any other diabetic medications.  Will increased glipizide to 5mg  bid.  See if sugars improve.  Continue metformin.  Follow    4. Arm mass, left Being followed.  Has f/u at [facility] in 3-05/2014.    5. Generalized abdominal pain Persistent.  W/up as outlined.  Declines any further evaluation at this time.    6. Diarrhea Better, but still persists.  Declines further w/up.    7. Abnormal chest CT Resolved.  Chest CT revealed no evidence of pneumonia.  See report.  Plan for f/u CT chest in one year after last CT (08/20/13)/    8. Bilateral arm pain Better.  Follow.    9. Anemia, unspecified anemia type Had GI w/up.  hgb stable at 12.4.  Follow.    10. History of colonic polyps Colonoscopy 11/18/13 - four polyps resected and retrieved, non bleeding internal hemorrhoids.    11. Hypercholesterolemia Low cholesterol diet and exercise.  LDL 88.  Follow.   Lab Results  Component Value Date   CHOL 172 03/02/2014   HDL 22.70* 03/02/2014   LDLCALC 51 05/14/2013   LDLDIRECT 88.2 03/02/2014   TRIG 238.0* 03/02/2014   CHOLHDL 8 03/02/2014   12. Throat congestion Mucinex and robitussin as directed.  Saline nasal spray as directed.  Follow.    13. .  ABNORMAL CHEST CT.  Recommended f/u ct in one year after last.    HEALTH MAINTENANCE.  Physical 10/15/13.    Prostate checks and PSAs through Dr Jacqlyn Larsen.  GI as outlined.  prevnar given.   I spent 25 minutes with the patient and more than 50% of the time was spent in consultation regarding the above.

## 2014-03-13 ENCOUNTER — Encounter: Payer: Self-pay | Admitting: Internal Medicine

## 2014-03-14 ENCOUNTER — Encounter: Payer: Self-pay | Admitting: Internal Medicine

## 2014-03-16 ENCOUNTER — Other Ambulatory Visit: Payer: Self-pay | Admitting: *Deleted

## 2014-03-16 ENCOUNTER — Other Ambulatory Visit: Payer: Self-pay

## 2014-03-16 ENCOUNTER — Telehealth: Payer: Self-pay

## 2014-03-16 MED ORDER — GLUCOSE BLOOD VI STRP: ORAL_STRIP | Status: DC

## 2014-03-16 MED ORDER — BLOOD GLUCOSE MONITOR KIT: PACK | Status: DC

## 2014-03-16 MED ORDER — ONETOUCH ULTRASOFT LANCETS MISC: Status: DC

## 2014-03-16 MED ORDER — METFORMIN HCL 1000 MG PO TABS: 1000.0000 mg | ORAL_TABLET | Freq: Two times a day (BID) | ORAL | Status: DC

## 2014-03-16 NOTE — Telephone Encounter (Signed)
Patient stated that he has stopped taking his glipizide because he believes it is causing his sugar to be elevated and a sore throat. Dr. Nicki Reaper is aware of patient's comments and has responded via mychart message. Awaiting patient to respond at this time.

## 2014-03-23 NOTE — Telephone Encounter (Signed)
Unread mychart message mailed to patient 

## 2014-03-26 ENCOUNTER — Other Ambulatory Visit: Payer: Self-pay | Admitting: Internal Medicine

## 2014-03-26 DIAGNOSIS — E119 Type 2 diabetes mellitus without complications: Secondary | ICD-10-CM

## 2014-03-26 NOTE — Progress Notes (Signed)
Order placed for endocrinology referral.  

## 2014-04-16 ENCOUNTER — Other Ambulatory Visit: Payer: Self-pay

## 2014-04-16 ENCOUNTER — Ambulatory Visit (INDEPENDENT_AMBULATORY_CARE_PROVIDER_SITE_OTHER): Payer: PPO | Admitting: Endocrinology

## 2014-04-16 ENCOUNTER — Encounter: Payer: Self-pay | Admitting: Endocrinology

## 2014-04-16 VITALS — BP 124/62 | HR 76 | Resp 14 | Ht 69.25 in | Wt 191.5 lb

## 2014-04-16 DIAGNOSIS — I10 Essential (primary) hypertension: Secondary | ICD-10-CM

## 2014-04-16 DIAGNOSIS — E7800 Pure hypercholesterolemia, unspecified: Secondary | ICD-10-CM

## 2014-04-16 DIAGNOSIS — E119 Type 2 diabetes mellitus without complications: Secondary | ICD-10-CM

## 2014-04-16 LAB — HM DIABETES FOOT EXAM: HM Diabetic Foot Exam: NORMAL

## 2014-04-16 MED ORDER — GLIPIZIDE 5 MG PO TABS
ORAL_TABLET | ORAL | Status: DC
Start: 2014-04-16 — End: 2014-10-13

## 2014-04-16 MED ORDER — METFORMIN HCL 1000 MG PO TABS: 1000.0000 mg | ORAL_TABLET | Freq: Two times a day (BID) | ORAL | Status: DC

## 2014-04-16 NOTE — Patient Instructions (Signed)
Check sugars atleast once daily ( please rotate times of the day when you are checking).  Record them in a log book and bring that/meter to next appointment.   Increase glipizide to 5mg  morning and 10 mg with supper.  Change metformin to 1000 mg morning and 1000 mg at nighttime.   Please come back for a follow-up appointment in 3 months

## 2014-04-16 NOTE — Progress Notes (Signed)
Reason for visit-  Zachary Buck is a 74 y.o.-year-old male, referred by his PCP,  Einar Pheasant, MD for management of Type 2 diabetes, uncontrolled, without complications.   HPI- Patient has been diagnosed with diabetes in ~2006. Recalls being initially on lifestyle modifications.  Tried  Metformin, Glipizide. he has not been on insulin before. Doesn't wish to try any injectable therapy. He reports that "all treatment options have to be agreed on by his wife for him to try"  Has been having chronic abdominal pain with a neg GI workup so far. Taking a lot of supplements for various things.  Pt is currently on a regimen of: - Metformin 1000 mg po bid - Glipizide 5 mg twice daily ( start Jan 2016)   Last hemoglobin A1c was: Lab Results  Component Value Date   HGBA1C 8.3* 03/02/2014   HGBA1C 8.4* 10/15/2013   HGBA1C 7.7* 05/14/2013     Pt checks his sugars 1 a day . Uses One Touch ultra2 glucometer. By sugar log averages brought in by patient they are:  PREMEAL Breakfast Lunch Dinner Bedtime Overall  Glucose range: 181-228      Mean/median:        POST-MEAL PC Breakfast PC Lunch PC Dinner  Glucose range:     Mean/median:       Hypoglycemia-  No lows. Lowest sugar was n/a; he has hypoglycemia awareness at 70.   Dietary habits- eats three times daily. Tries to limit carbs, sweetened beverages, sodas, desserts. Does pretty well with that. Exercise- walks M/W/F on TD 20 minutes at a time Weight - down  Wt Readings from Last 3 Encounters:  04/16/14 191 lb 8 oz (86.864 kg)  03/04/14 196 lb 8 oz (89.132 kg)  12/17/13 198 lb 8 oz (90.039 kg)    Diabetes Complications-  Nephropathy- Yes, ? Developing Stage2  CKD, last BUN/creatinine- GFR 57-68 Lab Results  Component Value Date   BUN 25* 03/02/2014   CREATININE 1.3 03/02/2014   Lab Results  Component Value Date   GFR 57.35* 03/02/2014    Lab Results  Component Value Date   MICRALBCREAT 3.1 10/15/2013      Retinopathy- No, Last DEE was in August 2015 Neuropathy- no numbness and tingling in his feet. No known neuropathy.  Associated history - Has CAD s/p 2 stents 2006 . No prior stroke. No hypothyroidism. his last TSH was  Lab Results  Component Value Date   TSH 3.21 01/22/2013    Hyperlipidemia-  his last set of lipids were- Currently on Fish oil. Tolerating well.  Does not want to try any statin therapy as wife has been concerned about it Lab Results  Component Value Date   CHOL 172 03/02/2014   HDL 22.70* 03/02/2014   LDLCALC 51 05/14/2013   LDLDIRECT 88.2 03/02/2014   TRIG 238.0* 03/02/2014   CHOLHDL 8 03/02/2014    Blood Pressure/HTN- Patient's blood pressure is well controlled today on current regimen that includes ACE-I ( lisinopril).  Pt has FH of DM in mother.  I have reviewed the patient's past medical history, family and social history, surgical history, medications and allergies.  Past Medical History  Diagnosis Date  . Hypercholesterolemia   . Atherosclerotic heart disease     s/p MI, s/p stent mid circumflex  . Hypertension   . Diabetes mellitus without complication   . Clavicle fracture   . Leg fracture    Past Surgical History  Procedure Laterality Date  . Tonsillectomy    .  Coronary stent placement     Family History  Problem Relation Age of Onset  . Aneurysm Father 40    of the heart  . Diabetes Mother    History   Social History  . Marital Status: Married    Spouse Name: N/A  . Number of Children: 2  . Years of Education: N/A   Occupational History  .     Social History Main Topics  . Smoking status: Never Smoker   . Smokeless tobacco: Never Used  . Alcohol Use: No  . Drug Use: Not on file  . Sexual Activity: Not on file   Other Topics Concern  . Not on file   Social History Narrative   Current Outpatient Prescriptions on File Prior to Visit  Medication Sig Dispense Refill  . aspirin 81 MG tablet Take 81 mg by mouth daily.     . blood glucose meter kit and supplies KIT Dispense based on patient and insurance preference. Use up to four times daily as directed. (FOR ICD-9 250.00, 250.01). 1 each 0  . Cholecalciferol (VITAMIN D-3) 1000 UNITS CAPS Take by mouth.    . Coenzyme Q10 (COQ-10) 100 MG CAPS Take by mouth daily.    Marland Kitchen glucose blood (ONE TOUCH ULTRA TEST) test strip Use as instructed 100 each 12  . Lancets (ONETOUCH ULTRASOFT) lancets Use as instructed 100 each 12  . levocarnitine (CARNITOR) 250 MG capsule Take 250 mg by mouth daily.    Marland Kitchen lisinopril (PRINIVIL,ZESTRIL) 20 MG tablet Take 20 mg by mouth daily.    . Omega-3 Fatty Acids (FISH OIL) 1000 MG CAPS Take 3 capsules by mouth daily.    . vitamin E (VITAMIN E) 400 UNIT capsule Take 400 Units by mouth daily.    . vitamin k 100 MCG tablet Take 100 mcg by mouth daily.     No current facility-administered medications on file prior to visit.   Allergies  Allergen Reactions  . Niaspan [Niacin Er]      Review of Systems: [x]  complains of  [  ] denies General:   [  ] Recent weight change [  ] Fatigue  [  ] Loss of appetite Eyes: [  ]  Vision Difficulty [  ]  Eye pain ENT: [  ]  Hearing difficulty [  ]  Difficulty Swallowing CVS: [  ] Chest pain [  ]  Palpitations/Irregular Heart beat [  ]  Shortness of breath lying flat [  ] Swelling of legs Resp: [  ] Frequent Cough [  ] Shortness of Breath  [  ]  Wheezing GI: [  ] Heartburn  [  ] Nausea or Vomiting  [  ] Diarrhea [  ] Constipation  [ x ] Abdominal Pain GU: [  ]  Polyuria  [ x ]  nocturia Bones/joints:  [x  ]  Muscle aches  [  ] Joint Pain  [  ] Bone pain Skin/Hair/Nails: [  ]  Rash  [  ] New stretch marks [  ]  Itching [  ] Hair loss [  ]  Excessive hair growth Reproduction: [  ] Low sexual desire , [  ]  Women: Menstrual cycle problems [  ]  Women: Breast Discharge [  ] Men: Difficulty with erections [  ]  Men: Enlarged Breasts CNS: [  ] Frequent Headaches [  ] Blurry vision [  ] Tremors [  ] Seizures [   ] Loss of consciousness [  ]  Localized weakness Endocrine: [  ]  Excess thirst [  ]  Feeling excessively hot [  ]  Feeling excessively cold Heme: [  ]  Easy bruising [  ]  Enlarged glands or lumps in neck Allergy: [  ]  Food allergies [  ] Environmental allergies  PE: BP 124/62 mmHg  Pulse 76  Resp 14  Ht 5' 9.25" (1.759 m)  Wt 191 lb 8 oz (86.864 kg)  BMI 28.07 kg/m2  SpO2 97% Wt Readings from Last 3 Encounters:  04/16/14 191 lb 8 oz (86.864 kg)  03/04/14 196 lb 8 oz (89.132 kg)  12/17/13 198 lb 8 oz (90.039 kg)   GENERAL: No acute distress, well developed HEENT:  Eye exam shows normal external appearance. Oral exam shows normal mucosa .  NECK:   Neck exam shows no lymphadenopathy. No Carotids bruits. Thyroid is not enlarged and no nodules felt.  no acanthosis nigricans LUNGS:         Chest is symmetrical. Lungs are clear to auscultation.Marland Kitchen   HEART:         Heart sounds:  S1 and S2 are normal. No murmurs or clicks heard. ABDOMEN:  No Distention present. Liver and spleen are not palpable. No other mass , generalized mild tenderness present.  EXTREMITIES:     There is no edema. 2+ DP pulses  NEUROLOGICAL:     Grossly intact.            Diabetic foot exam done with shoes and socks removed: Normal Monofilament testing bilaterally. No deformities of toes.  Nails  Not dystrophic. Skin normal color. No open wounds. Dry skin.  MUSCULOSKELETAL:       There is no enlargement or gross deformity of the joints.  SKIN:       No rash  ASSESSMENT AND PLAN: Problem List Items Addressed This Visit      Cardiovascular and Mediastinum   Essential hypertension, benign    Bp at target today. Recent urine MA negative August 2015.         Endocrine   Diabetes - Primary    Recent A1c and sugars are not controlled. We discussed about target A1c and goal sugars, long term risks of uncontrolled DM, FS checks at home.   He has agreed to check his sugars 1x daily at various times of the day.  He wishes  to continue current therapy, rather than adding on any other treatment options for DM. Discussed SGLT2, GLP-1, DPPIV. We will continue current metformin and increase Glipizide to 68m qam and 10 mg PO Pm. If he starts to notice any lows, then he is aware to notify me. Move metformin to night time to see if this improves his morning sugars.         Relevant Medications   metFORMIN (GLUCOPHAGE) tablet   glipiZIDE (GLUCOTROL) tablet     Other   Hypercholesterolemia    Recent LDL has been at target on current therapy. Expect improvement in TG values with better sugars.          Stop all nature supplements to assess whether improvement in abdominal pain. He was also also encouraged to eat a yogurt a day.   - Return to clinic in 3 mo with sugar log/meter.  Londen Lorge PDr [facility]2/26/2016 3:12 PM

## 2014-04-16 NOTE — Progress Notes (Signed)
Pre visit review using our clinic review tool, if applicable. No additional management support is needed unless otherwise documented below in the visit note. 

## 2014-04-17 ENCOUNTER — Telehealth: Payer: Self-pay | Admitting: *Deleted

## 2014-04-17 NOTE — Assessment & Plan Note (Signed)
Bp at target today. Recent urine MA negative August 2015.

## 2014-04-17 NOTE — Assessment & Plan Note (Signed)
Recent LDL has been at target on current therapy. Expect improvement in TG values with better sugars.

## 2014-04-17 NOTE — Assessment & Plan Note (Signed)
Recent A1c and sugars are not controlled. We discussed about target A1c and goal sugars, long term risks of uncontrolled DM, FS checks at home.   He has agreed to check his sugars 1x daily at various times of the day.  He wishes to continue current therapy, rather than adding on any other treatment options for DM. Discussed SGLT2, GLP-1, DPPIV. We will continue current metformin and increase Glipizide to 5mg  qam and 10 mg PO Pm. If he starts to notice any lows, then he is aware to notify me. Move metformin to night time to see if this improves his morning sugars.

## 2014-04-17 NOTE — Telephone Encounter (Signed)
Pt was in office to see Dr. Howell Rucks on 04/16/14 & dropped off copy of blood sugar readings (placed in green folder)

## 2014-04-19 NOTE — Telephone Encounter (Signed)
Reviewed. Elevated.  Seeing Dr Howell Rucks.

## 2014-05-18 ENCOUNTER — Encounter: Payer: Self-pay | Admitting: Internal Medicine

## 2014-05-27 ENCOUNTER — Encounter: Payer: Self-pay | Admitting: Internal Medicine

## 2014-05-27 ENCOUNTER — Ambulatory Visit (INDEPENDENT_AMBULATORY_CARE_PROVIDER_SITE_OTHER): Payer: PPO | Admitting: Internal Medicine

## 2014-05-27 ENCOUNTER — Telehealth: Payer: Self-pay | Admitting: Internal Medicine

## 2014-05-27 VITALS — BP 130/80 | HR 68 | Temp 98.2°F | Ht 69.25 in | Wt 195.2 lb

## 2014-05-27 DIAGNOSIS — R938 Abnormal findings on diagnostic imaging of other specified body structures: Secondary | ICD-10-CM

## 2014-05-27 DIAGNOSIS — E7800 Pure hypercholesterolemia, unspecified: Secondary | ICD-10-CM

## 2014-05-27 DIAGNOSIS — I10 Essential (primary) hypertension: Secondary | ICD-10-CM | POA: Diagnosis not present

## 2014-05-27 DIAGNOSIS — D509 Iron deficiency anemia, unspecified: Secondary | ICD-10-CM

## 2014-05-27 DIAGNOSIS — E119 Type 2 diabetes mellitus without complications: Secondary | ICD-10-CM

## 2014-05-27 DIAGNOSIS — I2510 Atherosclerotic heart disease of native coronary artery without angina pectoris: Secondary | ICD-10-CM

## 2014-05-27 DIAGNOSIS — R1084 Generalized abdominal pain: Secondary | ICD-10-CM

## 2014-05-27 DIAGNOSIS — D649 Anemia, unspecified: Secondary | ICD-10-CM

## 2014-05-27 DIAGNOSIS — R2232 Localized swelling, mass and lump, left upper limb: Secondary | ICD-10-CM

## 2014-05-27 DIAGNOSIS — R9389 Abnormal findings on diagnostic imaging of other specified body structures: Secondary | ICD-10-CM

## 2014-05-27 LAB — CBC WITH DIFFERENTIAL/PLATELET
BASOS PCT: 0.8 % (ref 0.0–3.0)
Basophils Absolute: 0.1 10*3/uL (ref 0.0–0.1)
EOS ABS: 1.1 10*3/uL — AB (ref 0.0–0.7)
EOS PCT: 16 % — AB (ref 0.0–5.0)
HCT: 34.1 % — ABNORMAL LOW (ref 39.0–52.0)
Hemoglobin: 11.7 g/dL — ABNORMAL LOW (ref 13.0–17.0)
Lymphocytes Relative: 22.4 % (ref 12.0–46.0)
Lymphs Abs: 1.6 10*3/uL (ref 0.7–4.0)
MCHC: 34.3 g/dL (ref 30.0–36.0)
MCV: 83 fl (ref 78.0–100.0)
Monocytes Absolute: 0.6 10*3/uL (ref 0.1–1.0)
Monocytes Relative: 8.4 % (ref 3.0–12.0)
NEUTROS ABS: 3.7 10*3/uL (ref 1.4–7.7)
Neutrophils Relative %: 52.4 % (ref 43.0–77.0)
Platelets: 136 10*3/uL — ABNORMAL LOW (ref 150.0–400.0)
RBC: 4.11 Mil/uL — AB (ref 4.22–5.81)
RDW: 15.3 % (ref 11.5–15.5)
WBC: 7.2 10*3/uL (ref 4.0–10.5)

## 2014-05-27 LAB — BASIC METABOLIC PANEL
BUN: 20 mg/dL (ref 6–23)
CHLORIDE: 105 meq/L (ref 96–112)
CO2: 25 mEq/L (ref 19–32)
Calcium: 9.7 mg/dL (ref 8.4–10.5)
Creatinine, Ser: 1.31 mg/dL (ref 0.40–1.50)
GFR: 56.81 mL/min — AB (ref >60.00)
Glucose, Bld: 195 mg/dL — ABNORMAL HIGH (ref 70–99)
POTASSIUM: 4.6 meq/L (ref 3.5–5.1)
Sodium: 136 mEq/L (ref 135–145)

## 2014-05-27 LAB — HEPATIC FUNCTION PANEL
ALT: 40 U/L (ref 0–53)
AST: 35 U/L (ref 0–37)
Albumin: 3.5 g/dL (ref 3.5–5.2)
Alkaline Phosphatase: 62 U/L (ref 39–117)
Bilirubin, Direct: -0.2 mg/dL — ABNORMAL LOW (ref 0.0–0.3)
TOTAL PROTEIN: 8 g/dL (ref 6.0–8.3)
Total Bilirubin: 0.5 mg/dL (ref 0.2–1.2)

## 2014-05-27 LAB — LIPID PANEL
CHOLESTEROL: 176 mg/dL (ref 0–200)
HDL: 25.3 mg/dL — ABNORMAL LOW (ref >39.00)
NONHDL: 150.7
TRIGLYCERIDES: 339 mg/dL — AB (ref 0.0–149.0)
Total CHOL/HDL Ratio: 7
VLDL: 67.8 mg/dL — ABNORMAL HIGH (ref 0.0–40.0)

## 2014-05-27 LAB — LDL CHOLESTEROL, DIRECT: Direct LDL: 82 mg/dL

## 2014-05-27 LAB — HEMOGLOBIN A1C: Hgb A1c MFr Bld: 7.3 % — ABNORMAL HIGH (ref 4.6–6.5)

## 2014-05-27 LAB — FERRITIN: Ferritin: 16.3 ng/mL — ABNORMAL LOW (ref 22.0–322.0)

## 2014-05-27 LAB — TSH: TSH: 4.37 u[IU]/mL (ref 0.35–4.50)

## 2014-05-27 MED ORDER — FERROUS FUM-IRON POLYSACCH 162-115.2 MG PO CAPS
1.0000 | ORAL_CAPSULE | Freq: Every day | ORAL | Status: DC
Start: 2014-05-27 — End: 2014-07-15

## 2014-05-27 NOTE — Patient Instructions (Signed)
[place]  tradjenta

## 2014-05-27 NOTE — Telephone Encounter (Signed)
Pt was notified of labs via my chart.  Needs a f/u non fasting lab appt in 2 weeks.  Please notify the patient of the appointment date and time.  Thanks.   

## 2014-05-27 NOTE — Progress Notes (Signed)
Patient ID: ELYJAH HAZAN, male   DOB: 07-Jul-1940, 74 y.o.   MRN: [id number]   Subjective:    Patient ID: AYHAM WORD, male    DOB: 04-23-1940, 74 y.o.   MRN: [id number]  HPI  Patient here for a scheduled follow up.  Reports am sugars for March are averaging 210.  Saw Dr Howell Rucks.  Did not want to change medication.  Is taking glipizide.  No significant problems with lows.  He has had a lows, but this is rare.  No cardiac symptoms with increased activity or exertion.  Breathing stable.  Nausea better.  No significant abdominal pain.  Decreased diarrhea.  Not willing to take any other diabetic medications.     Past Medical History  Diagnosis Date  . Hypercholesterolemia   . Atherosclerotic heart disease     s/p MI, s/p stent mid circumflex  . Hypertension   . Diabetes mellitus without complication   . Clavicle fracture   . Leg fracture     Current Outpatient Prescriptions on File Prior to Visit  Medication Sig Dispense Refill  . aspirin 81 MG tablet Take 81 mg by mouth daily.    . blood glucose meter kit and supplies KIT Dispense based on patient and insurance preference. Use up to four times daily as directed. (FOR ICD-9 250.00, 250.01). 1 each 0  . Cholecalciferol (VITAMIN D-3) 1000 UNITS CAPS Take by mouth.    . Coenzyme Q10 (COQ-10) 100 MG CAPS Take by mouth daily.    Marland Kitchen glipiZIDE (GLUCOTROL) 5 MG tablet Use 5 mg in the morning and 10 mg in the evening. 270 tablet 1  . glucose blood (ONE TOUCH ULTRA TEST) test strip Use as instructed 100 each 12  . Lancets (ONETOUCH ULTRASOFT) lancets Use as instructed 100 each 12  . levocarnitine (CARNITOR) 250 MG capsule Take 250 mg by mouth daily.    Marland Kitchen lisinopril (PRINIVIL,ZESTRIL) 20 MG tablet Take 20 mg by mouth daily.    . metFORMIN (GLUCOPHAGE) 1000 MG tablet Take 1 tablet (1,000 mg total) by mouth 2 (two) times daily with a meal. 180 tablet 1  . Omega-3 Fatty Acids (FISH OIL) 1000 MG CAPS Take 3 capsules by mouth daily.     . vitamin E (VITAMIN E) 400 UNIT capsule Take 400 Units by mouth daily.    . vitamin k 100 MCG tablet Take 100 mcg by mouth daily.     No current facility-administered medications on file prior to visit.    Review of Systems  Constitutional: Negative for appetite change and unexpected weight change.  HENT: Negative for congestion and sinus pressure.   Respiratory: Negative for cough, chest tightness and shortness of breath.   Cardiovascular: Negative for chest pain, palpitations and leg swelling.  Gastrointestinal: Negative for nausea, vomiting, abdominal pain and diarrhea.  Neurological: Negative for dizziness, light-headedness and headaches.       Objective:    Physical Exam  Constitutional: He appears well-developed and well-nourished. No distress.  HENT:  Nose: Nose normal.  Mouth/Throat: Oropharynx is clear and moist.  Neck: Neck supple. No thyromegaly present.  Cardiovascular: Normal rate and regular rhythm.   Pulmonary/Chest: Effort normal and breath sounds normal. No respiratory distress.  Abdominal: Soft. Bowel sounds are normal. There is no tenderness.  Musculoskeletal: He exhibits no edema.  Lymphadenopathy:    He has no cervical adenopathy.  Psychiatric: He has a normal mood and affect. His behavior is normal.    BP 130/80 mmHg  Pulse 68  Temp(Src) 98.2 F (36.8 C) (Oral)  Ht 5' 9.25" (1.759 m)  Wt 195 lb 4 oz (88.565 kg)  BMI 28.62 kg/m2  SpO2 95% Wt Readings from Last 3 Encounters:  05/27/14 195 lb 4 oz (88.565 kg)  04/16/14 191 lb 8 oz (86.864 kg)  03/04/14 196 lb 8 oz (89.132 kg)     Lab Results  Component Value Date   WBC 7.2 05/27/2014   HGB 11.7* 05/27/2014   HCT 34.1* 05/27/2014   PLT 136.0* 05/27/2014   GLUCOSE 195* 05/27/2014   CHOL 176 05/27/2014   TRIG 339.0* 05/27/2014   HDL 25.30* 05/27/2014   LDLDIRECT 82.0 05/27/2014   LDLCALC 51 05/14/2013   ALT 40 05/27/2014   AST 35 05/27/2014   NA 136 05/27/2014   K 4.6 05/27/2014   CL  105 05/27/2014   CREATININE 1.31 05/27/2014   BUN 20 05/27/2014   CO2 25 05/27/2014   TSH 4.37 05/27/2014   HGBA1C 7.3* 05/27/2014   MICROALBUR 4.7* 10/15/2013       Assessment & Plan:   Problem List Items Addressed This Visit    Abdominal pain    Improved.       Abnormal chest CT    Chest CT revealed no evidence of pneumonia.  See report.  Plan for f/u chest CT one year after last CT.        Anemia    EGD 11/18/13 - no repeat EGD.  Colonoscopy 11/18/13 - recommend f/u colonoscopy in 10/2016.  Currently symptoms improved.  Follow.        Relevant Orders   CBC with Differential/Platelet (Completed)   Ferritin (Completed)   Arm mass    Is followed at [facility].       CAD (coronary artery disease)    No chest pain or tightness.  Sees Dr Nehemiah Massed.  Continue risk factor modification.        Diabetes - Primary    Sugars as outlined.  Desires not to take any other medication.  Saw Dr Howell Rucks.  See her note.  Discussed diet and exercise.  Follow  Met b and a1c.        Relevant Orders   TSH (Completed)   Hemoglobin A1c (Completed)   Basic metabolic panel (Completed)   Essential hypertension, benign    Have him check and make sure taking lisinopril.  Follow pressures.  Follow metabolic panel.        Hypercholesterolemia    Low cholesterol diet and exercise.  No taking cholesterol medication.  Last LDL 88.       Relevant Orders   Hepatic function panel (Completed)   Lipid panel (Completed)       Einar Pheasant, MD

## 2014-05-27 NOTE — Progress Notes (Signed)
Pre visit review using our clinic review tool, if applicable. No additional management support is needed unless otherwise documented below in the visit note. 

## 2014-05-28 NOTE — Telephone Encounter (Signed)
Appt mailed

## 2014-05-28 NOTE — Telephone Encounter (Signed)
Order placed for GI referral.  Pt notified via my chart.  

## 2014-05-31 NOTE — Assessment & Plan Note (Signed)
Chest CT revealed no evidence of pneumonia.  See report.  Plan for f/u chest CT one year after last CT.

## 2014-05-31 NOTE — Assessment & Plan Note (Signed)
Sugars as outlined.  Desires not to take any other medication.  Saw Dr Howell Rucks.  See her note.  Discussed diet and exercise.  Follow  Met b and a1c.

## 2014-05-31 NOTE — Assessment & Plan Note (Signed)
Improved

## 2014-05-31 NOTE — Assessment & Plan Note (Signed)
Low cholesterol diet and exercise.  No taking cholesterol medication.  Last LDL 88.

## 2014-05-31 NOTE — Assessment & Plan Note (Signed)
EGD 11/18/13 - no repeat EGD.  Colonoscopy 11/18/13 - recommend f/u colonoscopy in 10/2016.  Currently symptoms improved.  Follow.

## 2014-05-31 NOTE — Assessment & Plan Note (Signed)
No chest pain or tightness.  Sees Dr Nehemiah Massed.  Continue risk factor modification.

## 2014-05-31 NOTE — Assessment & Plan Note (Signed)
Is followed at [facility].

## 2014-05-31 NOTE — Assessment & Plan Note (Signed)
Have him check and make sure taking lisinopril.  Follow pressures.  Follow metabolic panel.

## 2014-06-10 ENCOUNTER — Other Ambulatory Visit (INDEPENDENT_AMBULATORY_CARE_PROVIDER_SITE_OTHER): Payer: PPO

## 2014-06-10 ENCOUNTER — Encounter: Payer: Self-pay | Admitting: Internal Medicine

## 2014-06-10 DIAGNOSIS — D509 Iron deficiency anemia, unspecified: Secondary | ICD-10-CM

## 2014-06-10 LAB — CBC WITH DIFFERENTIAL/PLATELET
BASOS ABS: 0 10*3/uL (ref 0.0–0.1)
BASOS PCT: 0.6 % (ref 0.0–3.0)
Eosinophils Absolute: 0.5 10*3/uL (ref 0.0–0.7)
Eosinophils Relative: 6.7 % — ABNORMAL HIGH (ref 0.0–5.0)
HEMATOCRIT: 35.4 % — AB (ref 39.0–52.0)
HEMOGLOBIN: 11.9 g/dL — AB (ref 13.0–17.0)
LYMPHS PCT: 22.1 % (ref 12.0–46.0)
Lymphs Abs: 1.8 10*3/uL (ref 0.7–4.0)
MCHC: 33.5 g/dL (ref 30.0–36.0)
MCV: 84.6 fl (ref 78.0–100.0)
MONO ABS: 0.8 10*3/uL (ref 0.1–1.0)
Monocytes Relative: 9.6 % (ref 3.0–12.0)
Neutro Abs: 5 10*3/uL (ref 1.4–7.7)
Neutrophils Relative %: 61 % (ref 43.0–77.0)
Platelets: 148 10*3/uL — ABNORMAL LOW (ref 150.0–400.0)
RBC: 4.19 Mil/uL — AB (ref 4.22–5.81)
RDW: 15.9 % — ABNORMAL HIGH (ref 11.5–15.5)
WBC: 8.2 10*3/uL (ref 4.0–10.5)

## 2014-06-10 LAB — FERRITIN: Ferritin: 11.8 ng/mL — ABNORMAL LOW (ref 22.0–322.0)

## 2014-06-11 ENCOUNTER — Other Ambulatory Visit: Payer: Medicare HMO

## 2014-06-11 DIAGNOSIS — I10 Essential (primary) hypertension: Secondary | ICD-10-CM | POA: Insufficient documentation

## 2014-06-12 ENCOUNTER — Encounter: Payer: Self-pay | Admitting: Internal Medicine

## 2014-06-12 DIAGNOSIS — E782 Mixed hyperlipidemia: Secondary | ICD-10-CM | POA: Insufficient documentation

## 2014-06-12 NOTE — Telephone Encounter (Signed)
Mailed unread message to pt, requested response back or call.

## 2014-06-15 ENCOUNTER — Encounter: Payer: Self-pay | Admitting: Internal Medicine

## 2014-06-24 DIAGNOSIS — R0681 Apnea, not elsewhere classified: Secondary | ICD-10-CM | POA: Insufficient documentation

## 2014-07-07 ENCOUNTER — Ambulatory Visit (INDEPENDENT_AMBULATORY_CARE_PROVIDER_SITE_OTHER): Payer: Medicare HMO | Admitting: Ophthalmology

## 2014-07-15 ENCOUNTER — Telehealth: Payer: Self-pay | Admitting: *Deleted

## 2014-07-15 ENCOUNTER — Ambulatory Visit: Payer: PPO | Admitting: Endocrinology

## 2014-07-15 MED ORDER — POLYSACCHARIDE IRON COMPLEX 150 MG PO CAPS: 150.0000 mg | ORAL_CAPSULE | Freq: Every day | ORAL | Status: DC

## 2014-07-15 NOTE — Telephone Encounter (Signed)
Ok to change to equivalent iron that pharmacist suggest.  Same directions.

## 2014-07-15 NOTE — Telephone Encounter (Signed)
Rx sent 

## 2014-07-15 NOTE — Telephone Encounter (Signed)
Pharmacist called to inform us that they are not able to fill patients Tandem because of an issue with the manufacturer. Pharmacy would like to know if you would like to substitute for something else. They recommended ?Fair Ex 150 as a possible substitution. Please advise.

## 2014-08-26 ENCOUNTER — Other Ambulatory Visit: Payer: Self-pay | Admitting: Internal Medicine

## 2014-08-26 ENCOUNTER — Encounter: Payer: Self-pay | Admitting: Internal Medicine

## 2014-08-26 ENCOUNTER — Ambulatory Visit (INDEPENDENT_AMBULATORY_CARE_PROVIDER_SITE_OTHER): Payer: PPO | Admitting: Internal Medicine

## 2014-08-26 VITALS — BP 120/58 | HR 66 | Temp 97.9°F | Ht 69.25 in | Wt 191.1 lb

## 2014-08-26 DIAGNOSIS — Z86010 Personal history of colonic polyps: Secondary | ICD-10-CM

## 2014-08-26 DIAGNOSIS — D649 Anemia, unspecified: Secondary | ICD-10-CM | POA: Diagnosis not present

## 2014-08-26 DIAGNOSIS — E7800 Pure hypercholesterolemia, unspecified: Secondary | ICD-10-CM

## 2014-08-26 DIAGNOSIS — E119 Type 2 diabetes mellitus without complications: Secondary | ICD-10-CM | POA: Diagnosis not present

## 2014-08-26 DIAGNOSIS — I10 Essential (primary) hypertension: Secondary | ICD-10-CM

## 2014-08-26 DIAGNOSIS — R9389 Abnormal findings on diagnostic imaging of other specified body structures: Secondary | ICD-10-CM

## 2014-08-26 DIAGNOSIS — R21 Rash and other nonspecific skin eruption: Secondary | ICD-10-CM

## 2014-08-26 DIAGNOSIS — R0609 Other forms of dyspnea: Secondary | ICD-10-CM | POA: Diagnosis not present

## 2014-08-26 DIAGNOSIS — J3489 Other specified disorders of nose and nasal sinuses: Secondary | ICD-10-CM

## 2014-08-26 DIAGNOSIS — D509 Iron deficiency anemia, unspecified: Secondary | ICD-10-CM

## 2014-08-26 DIAGNOSIS — R938 Abnormal findings on diagnostic imaging of other specified body structures: Secondary | ICD-10-CM

## 2014-08-26 DIAGNOSIS — R2232 Localized swelling, mass and lump, left upper limb: Secondary | ICD-10-CM

## 2014-08-26 DIAGNOSIS — I2510 Atherosclerotic heart disease of native coronary artery without angina pectoris: Secondary | ICD-10-CM | POA: Diagnosis not present

## 2014-08-26 DIAGNOSIS — R1084 Generalized abdominal pain: Secondary | ICD-10-CM

## 2014-08-26 LAB — HEPATIC FUNCTION PANEL
ALT: 23 U/L (ref 0–53)
AST: 24 U/L (ref 0–37)
Albumin: 3.3 g/dL — ABNORMAL LOW (ref 3.5–5.2)
Alkaline Phosphatase: 51 U/L (ref 39–117)
BILIRUBIN DIRECT: 0.3 mg/dL (ref 0.0–0.3)
BILIRUBIN TOTAL: 0.6 mg/dL (ref 0.2–1.2)
Total Protein: 7.5 g/dL (ref 6.0–8.3)

## 2014-08-26 LAB — CBC WITH DIFFERENTIAL/PLATELET
Basophils Absolute: 0 10*3/uL (ref 0.0–0.1)
Basophils Relative: 0.9 % (ref 0.0–3.0)
EOS PCT: 5.4 % — AB (ref 0.0–5.0)
Eosinophils Absolute: 0.3 10*3/uL (ref 0.0–0.7)
HEMATOCRIT: 29.3 % — AB (ref 39.0–52.0)
HEMOGLOBIN: 9.8 g/dL — AB (ref 13.0–17.0)
LYMPHS ABS: 1 10*3/uL (ref 0.7–4.0)
Lymphocytes Relative: 19.5 % (ref 12.0–46.0)
MCHC: 33.3 g/dL (ref 30.0–36.0)
MCV: 81.2 fl (ref 78.0–100.0)
MONOS PCT: 11.3 % (ref 3.0–12.0)
Monocytes Absolute: 0.6 10*3/uL (ref 0.1–1.0)
Neutro Abs: 3.3 10*3/uL (ref 1.4–7.7)
Neutrophils Relative %: 62.9 % (ref 43.0–77.0)
Platelets: 129 10*3/uL — ABNORMAL LOW (ref 150.0–400.0)
RBC: 3.61 Mil/uL — ABNORMAL LOW (ref 4.22–5.81)
RDW: 14.9 % (ref 11.5–15.5)
WBC: 5.2 10*3/uL (ref 4.0–10.5)

## 2014-08-26 LAB — BASIC METABOLIC PANEL
BUN: 19 mg/dL (ref 6–23)
CALCIUM: 9.3 mg/dL (ref 8.4–10.5)
CHLORIDE: 105 meq/L (ref 96–112)
CO2: 25 mEq/L (ref 19–32)
Creatinine, Ser: 1.19 mg/dL (ref 0.40–1.50)
GFR: 63.42 mL/min (ref >60.00)
Glucose, Bld: 164 mg/dL — ABNORMAL HIGH (ref 70–99)
Potassium: 4.3 mEq/L (ref 3.5–5.1)
SODIUM: 137 meq/L (ref 135–145)

## 2014-08-26 LAB — LIPID PANEL
CHOLESTEROL: 171 mg/dL (ref 0–200)
HDL: 21.5 mg/dL — AB (ref >39.00)
NonHDL: 149.5
TRIGLYCERIDES: 287 mg/dL — AB (ref 0.0–149.0)
Total CHOL/HDL Ratio: 8
VLDL: 57.4 mg/dL — ABNORMAL HIGH (ref 0.0–40.0)

## 2014-08-26 LAB — FERRITIN: Ferritin: 5.7 ng/mL — ABNORMAL LOW (ref 22.0–322.0)

## 2014-08-26 LAB — HEMOGLOBIN A1C: Hgb A1c MFr Bld: 7 % — ABNORMAL HIGH (ref 4.6–6.5)

## 2014-08-26 LAB — LDL CHOLESTEROL, DIRECT: Direct LDL: 76 mg/dL

## 2014-08-26 MED ORDER — MUPIROCIN 2 % EX OINT: 1.0000 "application " | TOPICAL_OINTMENT | Freq: Two times a day (BID) | CUTANEOUS | Status: DC

## 2014-08-26 MED ORDER — TRIAMCINOLONE ACETONIDE 0.1 % EX CREA: 1.0000 "application " | TOPICAL_CREAM | Freq: Two times a day (BID) | CUTANEOUS | Status: DC

## 2014-08-26 NOTE — Assessment & Plan Note (Signed)
Blood pressure doing well on current regimen.  Follow pressures.  Follow metabolic panel.   

## 2014-08-26 NOTE — Progress Notes (Signed)
Order placed for f/u labs.  

## 2014-08-26 NOTE — Assessment & Plan Note (Signed)
bactroban as directed.  

## 2014-08-26 NOTE — Progress Notes (Addendum)
Patient ID: Zachary Buck, male   DOB: 1940/05/26, 74 y.o.   MRN: [id number]   Subjective:    Patient ID: Zachary Buck, male    DOB: 23-Sep-1940, 74 y.o.   MRN: [id number]  HPI  Patient here for a scheduled follow up.   States his stomach is better.  Still flares occasionally.  He has had EGD and colonoscopy.  We had discussed referral back to GI to complete wlup for iron deficient anemia.  He has noticed increased sob with exertion.  Notices more with inclines.  No chest pain.  No cough or congestion.  No acid reflux.  Brought in no sugar readings.  States am sugars averaging 200.  Does not usually check in pm.  If does, states is lower.  No problem with low blood sugars.     Past Medical History  Diagnosis Date  . Hypercholesterolemia   . Atherosclerotic heart disease     s/p MI, s/p stent mid circumflex  . Hypertension   . Diabetes mellitus without complication   . Clavicle fracture   . Leg fracture      Current Outpatient Prescriptions on File Prior to Visit  Medication Sig Dispense Refill  . aspirin 81 MG tablet Take 81 mg by mouth daily.    . blood glucose meter kit and supplies KIT Dispense based on patient and insurance preference. Use up to four times daily as directed. (FOR ICD-9 250.00, 250.01). 1 each 0  . Cholecalciferol (VITAMIN D-3) 1000 UNITS CAPS Take by mouth.    . Coenzyme Q10 (COQ-10) 100 MG CAPS Take by mouth daily.    Marland Kitchen glipiZIDE (GLUCOTROL) 5 MG tablet Use 5 mg in the morning and 10 mg in the evening. 270 tablet 1  . glucose blood (ONE TOUCH ULTRA TEST) test strip Use as instructed 100 each 12  . iron polysaccharides (FERREX 150) 150 MG capsule Take 1 capsule (150 mg total) by mouth daily. 30 capsule 2  . Lancets (ONETOUCH ULTRASOFT) lancets Use as instructed 100 each 12  . levocarnitine (CARNITOR) 250 MG capsule Take 250 mg by mouth daily.    Marland Kitchen lisinopril (PRINIVIL,ZESTRIL) 20 MG tablet Take 20 mg by mouth daily.    . metFORMIN  (GLUCOPHAGE) 1000 MG tablet Take 1 tablet (1,000 mg total) by mouth 2 (two) times daily with a meal. 180 tablet 1  . Omega-3 Fatty Acids (FISH OIL) 1000 MG CAPS Take 3 capsules by mouth daily.    . vitamin E (VITAMIN E) 400 UNIT capsule Take 400 Units by mouth daily.    . vitamin k 100 MCG tablet Take 100 mcg by mouth daily.     No current facility-administered medications on file prior to visit.    Review of Systems  Constitutional: Negative for appetite change and unexpected weight change.  HENT: Negative for congestion and sinus pressure.   Respiratory: Positive for shortness of breath (noticed with exertion. ). Negative for cough and chest tightness.   Cardiovascular: Negative for chest pain, palpitations and leg swelling.  Gastrointestinal: Negative for nausea, vomiting, abdominal pain and diarrhea.  Genitourinary: Negative for dysuria and difficulty urinating.  Skin: Negative for color change and rash.  Neurological: Negative for dizziness, light-headedness and headaches.  Psychiatric/Behavioral: Negative for dysphoric mood and agitation.       Objective:   blood pressure recheck:  128/62  Physical Exam  Constitutional: He appears well-developed and well-nourished. No distress.  HENT:  Head: Normocephalic and atraumatic.  Nose: Nose normal.  Mouth/Throat: Oropharynx is clear and moist. No oropharyngeal exudate.  Eyes: Right eye exhibits no discharge. Left eye exhibits no discharge.  Neck: Neck supple. No thyromegaly present.  Cardiovascular: Normal rate and regular rhythm.   Pulmonary/Chest: Effort normal and breath sounds normal. No respiratory distress. He has no wheezes.  Abdominal: Soft. Bowel sounds are normal.  No significant tenderness.    Musculoskeletal: He exhibits no edema or tenderness.  Lymphadenopathy:    He has no cervical adenopathy.  Skin: No rash noted. No erythema.  Psychiatric: He has a normal mood and affect. His behavior is normal.    BP 120/58  mmHg  Pulse 66  Temp(Src) 97.9 F (36.6 C) (Oral)  Ht 5' 9.25" (1.759 m)  Wt 191 lb 2 oz (86.694 kg)  BMI 28.02 kg/m2  SpO2 94% Wt Readings from Last 3 Encounters:  08/26/14 191 lb 2 oz (86.694 kg)  05/27/14 195 lb 4 oz (88.565 kg)  04/16/14 191 lb 8 oz (86.864 kg)     Lab Results  Component Value Date   WBC 5.2 08/26/2014   HGB 9.8* 08/26/2014   HCT 29.3* 08/26/2014   PLT 129.0* 08/26/2014   GLUCOSE 164* 08/26/2014   CHOL 171 08/26/2014   TRIG 287.0* 08/26/2014   HDL 21.50* 08/26/2014   LDLDIRECT 76.0 08/26/2014   LDLCALC 51 05/14/2013   ALT 23 08/26/2014   AST 24 08/26/2014   NA 137 08/26/2014   K 4.3 08/26/2014   CL 105 08/26/2014   CREATININE 1.19 08/26/2014   BUN 19 08/26/2014   CO2 25 08/26/2014   TSH 4.37 05/27/2014   INR 1.0 11/18/2013   HGBA1C 7.0* 08/26/2014   MICROALBUR 4.7* 10/15/2013       Assessment & Plan:   Problem List Items Addressed This Visit    Abdominal pain    Has improved.  Still with intermittent flares.  Check cbc.       Abnormal chest CT    08/2013 - chest CT - see report.  Recommend f/u chest CT one year after last.  Will pursue above cardiac w/up first.  Plan for f/u chest CT.        Anemia    EGD and colonoscopy 11/18/13.  With iron deficiency, wanted to refer back to GI for complete w/up for iron deficient anemia (especially given persistent GI symptoms).  Recheck cbc and ferritin.        Relevant Orders   CBC with Differential/Platelet (Completed)   Ferritin (Completed)   Arm mass    Just evaluated.  Smaller.        CAD (coronary artery disease)    Pt with sob with exertion.  Worsened over the last 2 months.  Notices more with inclines.  EKG revealed SR with TWI in Lead III.  No acute ischemic changes.  Given the change in symptoms and worsen sob with exertion, I do feel he warrants further cardiac evaluation.  appt with Dr Nehemiah Massed -Friday 09/04/14 - 12:15.        Diabetes    Sugars as outlined.  He feels overall -  doing better.  Low carb diet and exercise.  Follow met b and a1c.  States he is up to date with eye exams.        Relevant Orders   Hemoglobin A1c (Completed)   Basic metabolic panel (Completed)   Essential hypertension, benign    Blood pressure doing well on current regimen.  Follow pressures.  Follow metabolic panel.  History of colonic polyps    Colonoscopy 10/2013.        Hypercholesterolemia    Low cholesterol diet and exercise.  Check lipid panel.        Relevant Orders   Lipid panel (Completed)   Hepatic function panel (Completed)   LDL cholesterol, direct (Completed)   Nasal lesion    bactroban as directed.        Rash    TCC .1% as directed.  Notify me if persistent.         Other Visit Diagnoses    Dyspnea on exertion    -  Primary    Relevant Orders    EKG 12-Lead (Completed)      I spent 25 minutes with the patient and more than 50% of the time was spent in consultation regarding the above.     Einar Pheasant, MD

## 2014-08-26 NOTE — Assessment & Plan Note (Signed)
EGD and colonoscopy 11/18/13.  With iron deficiency, wanted to refer back to GI for complete w/up for iron deficient anemia (especially given persistent GI symptoms).  Recheck cbc and ferritin.

## 2014-08-26 NOTE — Assessment & Plan Note (Addendum)
Pt with sob with exertion.  Worsened over the last 2 months.  Notices more with inclines.  EKG revealed SR with TWI in Lead III.  No acute ischemic changes.  Given the change in symptoms and worsen sob with exertion, I do feel he warrants further cardiac evaluation.  appt with Dr Nehemiah Massed -Friday 09/04/14 - 12:15.

## 2014-08-26 NOTE — Assessment & Plan Note (Signed)
Just evaluated.  Smaller.

## 2014-08-26 NOTE — Assessment & Plan Note (Signed)
Colonoscopy 10/2013.

## 2014-08-26 NOTE — Assessment & Plan Note (Signed)
Low cholesterol diet and exercise.  Check lipid panel.   

## 2014-08-26 NOTE — Assessment & Plan Note (Signed)
Sugars as outlined.  He feels overall - doing better.  Low carb diet and exercise.  Follow met b and a1c.  States he is up to date with eye exams.

## 2014-08-26 NOTE — Assessment & Plan Note (Signed)
Has improved.  Still with intermittent flares.  Check cbc.

## 2014-08-26 NOTE — Assessment & Plan Note (Signed)
08/2013 - chest CT - see report.  Recommend f/u chest CT one year after last.  Will pursue above cardiac w/up first.  Plan for f/u chest CT.

## 2014-08-26 NOTE — Assessment & Plan Note (Signed)
TCC .1% as directed.  Notify me if persistent.

## 2014-08-26 NOTE — Progress Notes (Signed)
Pre visit review using our clinic review tool, if applicable. No additional management support is needed unless otherwise documented below in the visit note. 

## 2014-08-27 ENCOUNTER — Encounter: Payer: Self-pay | Admitting: *Deleted

## 2014-08-28 ENCOUNTER — Other Ambulatory Visit: Payer: Self-pay | Admitting: Internal Medicine

## 2014-08-28 NOTE — Telephone Encounter (Signed)
Pt notified of unread message, see result note.

## 2014-08-28 NOTE — Progress Notes (Signed)
Opened in error

## 2014-08-31 ENCOUNTER — Telehealth: Payer: Self-pay | Admitting: Internal Medicine

## 2014-08-31 NOTE — Telephone Encounter (Signed)
Spoke with patient regarding result note from 08/27/14.  See result note for details.

## 2014-08-31 NOTE — Telephone Encounter (Signed)
Pt called to ask question about lab result. Please advise pt.msn

## 2014-09-02 ENCOUNTER — Telehealth: Payer: Self-pay | Admitting: Gastroenterology

## 2014-09-02 NOTE — Telephone Encounter (Signed)
GI history was 09/25/2013.  Ok to schedule for MD appt.  Cammie Mcgee, will contact the patient

## 2014-09-03 ENCOUNTER — Encounter: Payer: Self-pay | Admitting: Internal Medicine

## 2014-09-03 ENCOUNTER — Encounter: Payer: Self-pay | Admitting: Gastroenterology

## 2014-09-03 NOTE — Telephone Encounter (Signed)
Please call him and let him know that if there is someone that he wants to see, to let us know.  We can schedule the appointment with the physician of his choice.

## 2014-09-04 ENCOUNTER — Other Ambulatory Visit (INDEPENDENT_AMBULATORY_CARE_PROVIDER_SITE_OTHER): Payer: PPO

## 2014-09-04 DIAGNOSIS — D509 Iron deficiency anemia, unspecified: Secondary | ICD-10-CM

## 2014-09-04 LAB — CBC WITH DIFFERENTIAL/PLATELET
BASOS PCT: 0.7 % (ref 0.0–3.0)
Basophils Absolute: 0 10*3/uL (ref 0.0–0.1)
Eosinophils Absolute: 0.3 10*3/uL (ref 0.0–0.7)
Eosinophils Relative: 3.9 % (ref 0.0–5.0)
HCT: 31.7 % — ABNORMAL LOW (ref 39.0–52.0)
Hemoglobin: 10.4 g/dL — ABNORMAL LOW (ref 13.0–17.0)
LYMPHS ABS: 1.4 10*3/uL (ref 0.7–4.0)
LYMPHS PCT: 20.9 % (ref 12.0–46.0)
MCHC: 32.7 g/dL (ref 30.0–36.0)
MCV: 81.1 fl (ref 78.0–100.0)
Monocytes Absolute: 0.7 10*3/uL (ref 0.1–1.0)
Monocytes Relative: 10 % (ref 3.0–12.0)
NEUTROS ABS: 4.3 10*3/uL (ref 1.4–7.7)
NEUTROS PCT: 64.5 % (ref 43.0–77.0)
PLATELETS: 141 10*3/uL — AB (ref 150.0–400.0)
RBC: 3.91 Mil/uL — ABNORMAL LOW (ref 4.22–5.81)
RDW: 15.1 % (ref 11.5–15.5)
WBC: 6.7 10*3/uL (ref 4.0–10.5)

## 2014-09-04 LAB — FERRITIN: FERRITIN: 7.2 ng/mL — AB (ref 22.0–322.0)

## 2014-09-07 ENCOUNTER — Encounter: Payer: Self-pay | Admitting: Internal Medicine

## 2014-09-17 ENCOUNTER — Encounter: Payer: Self-pay | Admitting: Internal Medicine

## 2014-09-17 DIAGNOSIS — D509 Iron deficiency anemia, unspecified: Secondary | ICD-10-CM

## 2014-09-17 NOTE — Telephone Encounter (Signed)
See his my chart message.  Please schedule pt for a f/u cbc and ferritin in the next couple of weeks.  Also send him IFOB.  Thanks.

## 2014-09-18 DIAGNOSIS — R002 Palpitations: Secondary | ICD-10-CM | POA: Insufficient documentation

## 2014-09-24 ENCOUNTER — Other Ambulatory Visit: Payer: Self-pay | Admitting: Internal Medicine

## 2014-09-24 ENCOUNTER — Other Ambulatory Visit (INDEPENDENT_AMBULATORY_CARE_PROVIDER_SITE_OTHER): Payer: PPO

## 2014-09-24 DIAGNOSIS — R197 Diarrhea, unspecified: Secondary | ICD-10-CM

## 2014-09-24 LAB — FECAL OCCULT BLOOD, IMMUNOCHEMICAL: FECAL OCCULT BLD: POSITIVE — AB

## 2014-09-25 ENCOUNTER — Encounter: Payer: Self-pay | Admitting: Internal Medicine

## 2014-09-28 ENCOUNTER — Encounter: Payer: Self-pay | Admitting: Internal Medicine

## 2014-09-28 DIAGNOSIS — D649 Anemia, unspecified: Secondary | ICD-10-CM

## 2014-09-28 DIAGNOSIS — D696 Thrombocytopenia, unspecified: Secondary | ICD-10-CM

## 2014-09-28 NOTE — Telephone Encounter (Signed)
Unread mychart message mailed to patient 

## 2014-09-30 ENCOUNTER — Other Ambulatory Visit (INDEPENDENT_AMBULATORY_CARE_PROVIDER_SITE_OTHER): Payer: PPO

## 2014-09-30 DIAGNOSIS — D509 Iron deficiency anemia, unspecified: Secondary | ICD-10-CM

## 2014-09-30 LAB — CBC WITH DIFFERENTIAL/PLATELET
BASOS ABS: 0 10*3/uL (ref 0.0–0.1)
BASOS PCT: 0.6 % (ref 0.0–3.0)
EOS ABS: 0.3 10*3/uL (ref 0.0–0.7)
Eosinophils Relative: 5.8 % — ABNORMAL HIGH (ref 0.0–5.0)
HCT: 32.2 % — ABNORMAL LOW (ref 39.0–52.0)
HEMOGLOBIN: 10.6 g/dL — AB (ref 13.0–17.0)
LYMPHS ABS: 1.1 10*3/uL (ref 0.7–4.0)
Lymphocytes Relative: 20.9 % (ref 12.0–46.0)
MCHC: 32.8 g/dL (ref 30.0–36.0)
MCV: 82 fl (ref 78.0–100.0)
MONOS PCT: 11.8 % (ref 3.0–12.0)
Monocytes Absolute: 0.6 10*3/uL (ref 0.1–1.0)
Neutro Abs: 3.2 10*3/uL (ref 1.4–7.7)
Neutrophils Relative %: 60.9 % (ref 43.0–77.0)
PLATELETS: 122 10*3/uL — AB (ref 150.0–400.0)
RBC: 3.92 Mil/uL — ABNORMAL LOW (ref 4.22–5.81)
RDW: 17.5 % — AB (ref 11.5–15.5)
WBC: 5.2 10*3/uL (ref 4.0–10.5)

## 2014-09-30 LAB — FERRITIN: FERRITIN: 11.7 ng/mL — AB (ref 22.0–322.0)

## 2014-10-02 NOTE — Telephone Encounter (Signed)
Order placed for hematology referral.  

## 2014-10-06 ENCOUNTER — Other Ambulatory Visit: Payer: PPO

## 2014-10-08 ENCOUNTER — Encounter: Payer: Self-pay | Admitting: *Deleted

## 2014-10-09 ENCOUNTER — Inpatient Hospital Stay: Payer: PPO

## 2014-10-09 ENCOUNTER — Inpatient Hospital Stay: Payer: PPO | Attending: Hematology and Oncology | Admitting: Hematology and Oncology

## 2014-10-09 VITALS — BP 154/80 | HR 70 | Temp 96.3°F | Wt 190.9 lb

## 2014-10-09 DIAGNOSIS — E119 Type 2 diabetes mellitus without complications: Secondary | ICD-10-CM | POA: Insufficient documentation

## 2014-10-09 DIAGNOSIS — I252 Old myocardial infarction: Secondary | ICD-10-CM | POA: Diagnosis not present

## 2014-10-09 DIAGNOSIS — I2510 Atherosclerotic heart disease of native coronary artery without angina pectoris: Secondary | ICD-10-CM | POA: Insufficient documentation

## 2014-10-09 DIAGNOSIS — D509 Iron deficiency anemia, unspecified: Secondary | ICD-10-CM | POA: Diagnosis present

## 2014-10-09 DIAGNOSIS — I10 Essential (primary) hypertension: Secondary | ICD-10-CM | POA: Insufficient documentation

## 2014-10-09 DIAGNOSIS — N400 Enlarged prostate without lower urinary tract symptoms: Secondary | ICD-10-CM | POA: Insufficient documentation

## 2014-10-09 DIAGNOSIS — E780 Pure hypercholesterolemia: Secondary | ICD-10-CM | POA: Insufficient documentation

## 2014-10-09 DIAGNOSIS — K2970 Gastritis, unspecified, without bleeding: Secondary | ICD-10-CM | POA: Diagnosis not present

## 2014-10-09 DIAGNOSIS — D649 Anemia, unspecified: Secondary | ICD-10-CM

## 2014-10-09 DIAGNOSIS — D696 Thrombocytopenia, unspecified: Secondary | ICD-10-CM | POA: Insufficient documentation

## 2014-10-09 DIAGNOSIS — Z7982 Long term (current) use of aspirin: Secondary | ICD-10-CM | POA: Insufficient documentation

## 2014-10-09 DIAGNOSIS — Z79899 Other long term (current) drug therapy: Secondary | ICD-10-CM | POA: Diagnosis not present

## 2014-10-09 DIAGNOSIS — K921 Melena: Secondary | ICD-10-CM | POA: Diagnosis not present

## 2014-10-09 LAB — CBC WITH DIFFERENTIAL/PLATELET
Basophils Absolute: 0.1 10*3/uL (ref 0–0.1)
Basophils Relative: 1 %
Eosinophils Absolute: 0.3 10*3/uL (ref 0–0.7)
Eosinophils Relative: 6 %
HCT: 34.1 % — ABNORMAL LOW (ref 40.0–52.0)
Hemoglobin: 11.3 g/dL — ABNORMAL LOW (ref 13.0–18.0)
Lymphocytes Relative: 19 %
Lymphs Abs: 1.2 10*3/uL (ref 1.0–3.6)
MCH: 26.6 pg (ref 26.0–34.0)
MCHC: 33 g/dL (ref 32.0–36.0)
MCV: 80.4 fL (ref 80.0–100.0)
Monocytes Absolute: 0.7 10*3/uL (ref 0.2–1.0)
Monocytes Relative: 12 %
Neutro Abs: 3.9 10*3/uL (ref 1.4–6.5)
Neutrophils Relative %: 62 %
Platelets: 118 10*3/uL — ABNORMAL LOW (ref 150–440)
RBC: 4.24 MIL/uL — ABNORMAL LOW (ref 4.40–5.90)
RDW: 17.8 % — ABNORMAL HIGH (ref 11.5–14.5)
WBC: 6.2 10*3/uL (ref 3.8–10.6)

## 2014-10-09 LAB — URINALYSIS COMPLETE WITH MICROSCOPIC (ARMC ONLY)
Bacteria, UA: NONE SEEN
Bilirubin Urine: NEGATIVE
Glucose, UA: NEGATIVE mg/dL
Hgb urine dipstick: NEGATIVE
Ketones, ur: NEGATIVE mg/dL
Leukocytes, UA: NEGATIVE
Nitrite: NEGATIVE
Protein, ur: NEGATIVE mg/dL
RBC / HPF: NONE SEEN RBC/hpf (ref 0–5)
Specific Gravity, Urine: 1.02 (ref 1.005–1.030)
Squamous Epithelial / LPF: NONE SEEN
pH: 5 (ref 5.0–8.0)

## 2014-10-09 LAB — RETICULOCYTES
RBC.: 4.24 MIL/uL — ABNORMAL LOW (ref 4.40–5.90)
Retic Count, Absolute: 97.5 10*3/uL (ref 19.0–183.0)
Retic Ct Pct: 2.3 % (ref 0.4–3.1)

## 2014-10-09 LAB — IRON AND TIBC
Iron: 46 ug/dL (ref 45–182)
Saturation Ratios: 12 % — ABNORMAL LOW (ref 17.9–39.5)
TIBC: 370 ug/dL (ref 250–450)
UIBC: 324 ug/dL

## 2014-10-09 LAB — FERRITIN: Ferritin: 13 ng/mL — ABNORMAL LOW (ref 24–336)

## 2014-10-09 LAB — TSH: TSH: 3.627 u[IU]/mL (ref 0.350–4.500)

## 2014-10-09 LAB — FOLATE: Folate: 18.2 ng/mL (ref >5.9)

## 2014-10-09 LAB — VITAMIN B12: Vitamin B-12: 291 pg/mL (ref 180–914)

## 2014-10-09 NOTE — Progress Notes (Signed)
Patient here today as new evaluation referred by Dr. Einar Pheasant.  Patient had upper endoscopy and colonoscopy in September of last year.  Had one polyp that would not stop bleeding during procedure.  Since that time has had stomach cramps and diarrhea.  Hgb is now 10.  Patient also had pneumonia and was placed on abx in April of this year.  Wife states that was the beginning of the problems. Patient states he gets fatigued easily.  Has positive quiac cards.

## 2014-10-11 LAB — KAPPA/LAMBDA LIGHT CHAINS
Kappa free light chain: 21.13 mg/L — ABNORMAL HIGH (ref 3.30–19.40)
Kappa, lambda light chain ratio: 0.16 — ABNORMAL LOW (ref 0.26–1.65)
Lambda free light chains: 128.29 mg/L — ABNORMAL HIGH (ref 5.71–26.30)

## 2014-10-11 LAB — IGG, IGA, IGM
IgA: 69 mg/dL (ref 61–437)
IgG (Immunoglobin G), Serum: 2930 mg/dL — ABNORMAL HIGH (ref 700–1600)
IgM, Serum: <6 mg/dL — ABNORMAL LOW (ref 15–143)

## 2014-10-11 LAB — ANA W/REFLEX IF POSITIVE: Anti Nuclear Antibody(ANA): NEGATIVE

## 2014-10-12 ENCOUNTER — Other Ambulatory Visit: Payer: Self-pay

## 2014-10-12 DIAGNOSIS — D649 Anemia, unspecified: Secondary | ICD-10-CM

## 2014-10-12 DIAGNOSIS — D696 Thrombocytopenia, unspecified: Secondary | ICD-10-CM

## 2014-10-12 LAB — PROTEIN ELECTROPHORESIS, SERUM
A/G Ratio: 0.7 (ref 0.7–1.7)
Albumin ELP: 3.2 g/dL (ref 2.9–4.4)
Alpha-1-Globulin: 0.2 g/dL (ref 0.0–0.4)
Alpha-2-Globulin: 0.7 g/dL (ref 0.4–1.0)
Beta Globulin: 1 g/dL (ref 0.7–1.3)
Gamma Globulin: 2.3 g/dL — ABNORMAL HIGH (ref 0.4–1.8)
Globulin, Total: 4.3 g/dL — ABNORMAL HIGH (ref 2.2–3.9)
M-Spike, %: 2.2 g/dL — ABNORMAL HIGH
Total Protein ELP: 7.5 g/dL (ref 6.0–8.5)

## 2014-10-13 ENCOUNTER — Other Ambulatory Visit: Payer: Self-pay | Admitting: *Deleted

## 2014-10-13 ENCOUNTER — Other Ambulatory Visit: Payer: Self-pay | Admitting: Surgical

## 2014-10-13 ENCOUNTER — Encounter: Payer: Self-pay | Admitting: Hematology and Oncology

## 2014-10-13 MED ORDER — METFORMIN HCL 1000 MG PO TABS: 1000.0000 mg | ORAL_TABLET | Freq: Two times a day (BID) | ORAL | Status: DC

## 2014-10-13 MED ORDER — GLIPIZIDE 5 MG PO TABS: ORAL_TABLET | ORAL | Status: DC

## 2014-10-13 MED ORDER — LISINOPRIL 20 MG PO TABS: 20.0000 mg | ORAL_TABLET | Freq: Every day | ORAL | Status: DC

## 2014-10-14 LAB — UPEP/TP, 24-HR URINE
Albumin, U: 52.4 %
Alpha 1, Urine: 2.8 %
Alpha 2, Urine: 8 %
Beta, Urine: 23 %
Gamma Globulin, Urine: 13.8 %
M-Spike, mg/24 hr: 10.6 mg/24 hr — ABNORMAL HIGH
M-spike, %: 8.8 % — ABNORMAL HIGH
Total Protein, Urine-Ur/day: 120.7 mg/24 hr (ref 30.0–150.0)
Total Protein, Urine: 12.7 mg/dL
Total Volume: 950

## 2014-10-15 ENCOUNTER — Encounter: Payer: Self-pay | Admitting: Hematology and Oncology

## 2014-10-18 ENCOUNTER — Other Ambulatory Visit: Payer: Self-pay | Admitting: Hematology and Oncology

## 2014-10-18 DIAGNOSIS — D472 Monoclonal gammopathy: Secondary | ICD-10-CM

## 2014-10-19 ENCOUNTER — Other Ambulatory Visit: Payer: Self-pay | Admitting: Hematology and Oncology

## 2014-10-19 ENCOUNTER — Telehealth: Payer: Self-pay | Admitting: Hematology and Oncology

## 2014-10-19 NOTE — Telephone Encounter (Signed)
Re:  Abnormal labs  Called patient about abnormal SPEP.  Requested immunofixation and bone survey (ordered).  Patient will be going on vacation starting 10/22/2014 for 10 days.  Lequita Asal, MD

## 2014-10-20 ENCOUNTER — Inpatient Hospital Stay: Payer: PPO

## 2014-10-20 ENCOUNTER — Ambulatory Visit
Admission: RE | Admit: 2014-10-20 | Discharge: 2014-10-20 | Disposition: A | Discharge status: Home / Self Care | Payer: PPO | Source: Ambulatory Visit | Attending: Hematology and Oncology | Admitting: Hematology and Oncology

## 2014-10-20 DIAGNOSIS — D509 Iron deficiency anemia, unspecified: Secondary | ICD-10-CM | POA: Diagnosis not present

## 2014-10-20 DIAGNOSIS — D472 Monoclonal gammopathy: Secondary | ICD-10-CM

## 2014-10-21 LAB — IMMUNOFIXATION ELECTROPHORESIS
IgA: 79 mg/dL (ref 61–437)
IgG (Immunoglobin G), Serum: 3130 mg/dL — ABNORMAL HIGH (ref 700–1600)
IgM, Serum: 5 mg/dL — ABNORMAL LOW (ref 15–143)
Total Protein ELP: 7.7 g/dL (ref 6.0–8.5)

## 2014-11-01 ENCOUNTER — Encounter: Payer: Self-pay | Admitting: Hematology and Oncology

## 2014-11-01 ENCOUNTER — Other Ambulatory Visit: Payer: Self-pay | Admitting: Hematology and Oncology

## 2014-11-01 DIAGNOSIS — D649 Anemia, unspecified: Secondary | ICD-10-CM

## 2014-11-01 DIAGNOSIS — D696 Thrombocytopenia, unspecified: Secondary | ICD-10-CM

## 2014-11-01 NOTE — Progress Notes (Signed)
[facility] day:  10/09/2014  Chief Complaint: Zachary Buck is a 74 y.o. male  with anemia who is referred in consultation by Dr. Einar Pheasant.  HPI: The patient states that he has been followed by Dr. Nicki Reaper for 10 years.He states that his hemoglobin started going down on 11/06/2013. He was seen by Dr. Donnella Sham of  gastroenterology. Colonoscopy on 10/2013 revealed 4 polyps. There was one polyp with excessive bleeding. EGD revealed gastritis. He has not had a capsule study.  Patient states that stool guaiacs on 09/28/2014 revealed blood in the stool. He has been on oral iron for 2 months. He does not believe that this has helped.  Ferritin was 11.8 on 05/27/2014.  Labs on 08/26/2014 revealed a hematocrit 29.3, hemoglobin 9.8, platelets 129,000 white count 5200.  Creatinine was 1.19.  He notes a history of pneumonia in 05/2014. He has also had some issues with diarrhea.  Probiotics did not help. He has had no other problems with infections.  He states that he eats six times a day secondary to diabetes. He eats meat daily. He eats a lot of venison and organic chicken. He denies any cravings (pica).  Heeats ice cream or milk of his stomach hurts.  Past Medical History  Diagnosis Date  . Hypercholesterolemia   . Atherosclerotic heart disease     s/p MI, s/p stent mid circumflex  . Hypertension   . Diabetes mellitus without complication   . Clavicle fracture   . Leg fracture   . Myocardial infarct   . CAD (coronary artery disease)   . Prostate enlargement   . Internal hemorrhoids   . Anemia     Past Surgical History  Procedure Laterality Date  . Tonsillectomy    . Coronary angioplasty with stent placement      Family History  Problem Relation Age of Onset  . Aneurysm Father 90    of the heart  . Diabetes Mother   . Heart attack Father     Social History:  reports that he has never smoked. He has never used smokeless  tobacco. He reports that he does not drink alcohol. His drug history is not on file.  Patient denies any exposure to radiation or toxins. The patient is accompanied by his wife, Zachary Buck, today.  Allergies:  Allergies  Allergen Reactions  . Niaspan [Niacin Er]     Current Medications: Current Outpatient Prescriptions  Medication Sig Dispense Refill  . blood glucose meter kit and supplies KIT Dispense based on patient and insurance preference. Use up to four times daily as directed. (FOR ICD-9 250.00, 250.01). 1 each 0  . Cholecalciferol (VITAMIN D-3) 1000 UNITS CAPS Take by mouth.    Marland Kitchen glucose blood (ONE TOUCH ULTRA TEST) test strip Use as instructed 100 each 12  . iron polysaccharides (FERREX 150) 150 MG capsule Take 1 capsule (150 mg total) by mouth daily. 30 capsule 2  . Lancets (ONETOUCH ULTRASOFT) lancets Use as instructed 100 each 12  . mupirocin ointment (BACTROBAN) 2 % Place 1 application into the nose 2 (two) times daily. 22 g 0  . aspirin 81 MG tablet Take 81 mg by mouth daily.    . Coenzyme Q10 (COQ-10) 100 MG CAPS Take by mouth daily.    Marland Kitchen glipiZIDE (GLUCOTROL) 5 MG tablet Use 5 mg in the morning and 10 mg in the evening. 270 tablet 1  . levocarnitine (CARNITOR) 250 MG capsule Take 250 mg by  mouth daily.    . metFORMIN (GLUCOPHAGE) 1000 MG tablet Take 1 tablet (1,000 mg total) by mouth 2 (two) times daily with a meal. 180 tablet 1  . Omega-3 Fatty Acids (FISH OIL) 1000 MG CAPS Take 3 capsules by mouth daily.    Marland Kitchen triamcinolone cream (KENALOG) 0.1 % Apply 1 application topically 2 (two) times daily. (Patient not taking: Reported on 10/09/2014) 30 g 0  . vitamin E (VITAMIN E) 400 UNIT capsule Take 400 Units by mouth daily.    . vitamin k 100 MCG tablet Take 100 mcg by mouth daily.     No current facility-administered medications for this visit.    Review of Systems:  GENERAL:  Feels tired.  No fevers or sweats.  Weight down 10 pounds in past year. PERFORMANCE STATUS (ECOG):   1 HEENT:  1-2 episodes of epistaxis in past 6 months.  No visual changes, runny nose, sore throat, mouth sores or tenderness. Lungs: No shortness of breath or cough.  No hemoptysis. Cardiac:  No chest pain, palpitations, orthopnea, or PND. GI:  Intermittent diarrhea.  Intermittent stomach pain.  No nausea, vomiting, constipation, melena or hematochezia. GU:  No urgency, frequency, dysuria, or hematuria. Musculoskeletal:  No back pain.  No joint pain.  No muscle tenderness. Extremities:  No pain or swelling. Skin:  No rashes or skin changes. Neuro:  No headache, numbness or weakness, balance or coordination issues. Endocrine:  No diabetes, thyroid issues, hot flashes or night sweats. Psych:  No mood changes, depression or anxiety. Pain:  No focal pain. Review of systems:  All other systems reviewed and found to be negative.  Physical Exam: Blood pressure 154/80, pulse 70, temperature 96.3 F (35.7 C), temperature source Tympanic, weight 190 lb 14.7 oz (86.6 kg). GENERAL:  Well developed, well nourished, sitting comfortably in the exam room in no acute distress. MENTAL STATUS:  Alert and oriented to person, place and time. HEAD:  Pearline Cables hair.  Normocephalic, atraumatic, face symmetric, no Cushingoid features. EYES:  Blue eyes.  Pupils equal round and reactive to light and accomodation.  No conjunctivitis or scleral icterus. ENT:  Oropharynx clear without lesion.  Tongue normal. Mucous membranes moist.  RESPIRATORY:  Clear to auscultation without rales, wheezes or rhonchi. CARDIOVASCULAR:  Regular rate and rhythm without murmur, rub or gallop. ABDOMEN:  Soft, slightly tender abdomen without any guarding or rebound tenderness. Active bowel sounds and no hepatosplenomegaly.  No masses. SKIN:  Salmon colored patch left lower extremity.  No rashes, ulcers or lesions. EXTREMITIES: No edema, no skin discoloration or tenderness.  No palpable cords. LYMPH NODES: No palpable cervical, supraclavicular,  axillary or inguinal adenopathy  NEUROLOGICAL: Unremarkable. PSYCH:  Appropriate.  Appointment on 10/09/2014  Component Date Value Ref Range Status  . WBC 10/09/2014 6.2  3.8 - 10.6 K/uL Final  . RBC 10/09/2014 4.24* 4.40 - 5.90 MIL/uL Final  . Hemoglobin 10/09/2014 11.3* 13.0 - 18.0 g/dL Final  . HCT 10/09/2014 34.1* 40.0 - 52.0 % Final  . MCV 10/09/2014 80.4  80.0 - 100.0 fL Final  . MCH 10/09/2014 26.6  26.0 - 34.0 pg Final  . MCHC 10/09/2014 33.0  32.0 - 36.0 g/dL Final  . RDW 10/09/2014 17.8* 11.5 - 14.5 % Final  . Platelets 10/09/2014 118* 150 - 440 K/uL Final   PLATELET COUNT PERFORMED ON CITRATED BLOOD  . Neutrophils Relative % 10/09/2014 62   Final  . Neutro Abs 10/09/2014 3.9  1.4 - 6.5 K/uL Final  . Lymphocytes Relative  10/09/2014 19   Final  . Lymphs Abs 10/09/2014 1.2  1.0 - 3.6 K/uL Final  . Monocytes Relative 10/09/2014 12   Final  . Monocytes Absolute 10/09/2014 0.7  0.2 - 1.0 K/uL Final  . Eosinophils Relative 10/09/2014 6   Final  . Eosinophils Absolute 10/09/2014 0.3  0 - 0.7 K/uL Final  . Basophils Relative 10/09/2014 1   Final  . Basophils Absolute 10/09/2014 0.1  0 - 0.1 K/uL Final  . Ferritin 10/09/2014 13* 24 - 336 ng/mL Final  . Iron 10/09/2014 46  45 - 182 ug/dL Final  . TIBC 10/09/2014 370  250 - 450 ug/dL Final  . Saturation Ratios 10/09/2014 12* 17.9 - 39.5 % Final  . UIBC 10/09/2014 324   Final  . Retic Ct Pct 10/09/2014 2.3  0.4 - 3.1 % Final  . RBC. 10/09/2014 4.24* 4.40 - 5.90 MIL/uL Final  . Retic Count, Manual 10/09/2014 97.5  19.0 - 183.0 K/uL Final  . Vitamin B-12 10/09/2014 291  180 - 914 pg/mL Final   Comment: (NOTE) This assay is not validated for testing neonatal or myeloproliferative syndrome specimens for Vitamin B12 levels. Performed at [facility]   . Folate 10/09/2014 18.2  >5.9 ng/mL Final  . TSH 10/09/2014 3.627  0.350 - 4.500 uIU/mL Final  . Total Protein ELP 10/09/2014 7.5  6.0 - 8.5 g/dL Final  . Albumin ELP  10/09/2014 3.2  2.9 - 4.4 g/dL Final  . Alpha-1-Globulin 10/09/2014 0.2  0.0 - 0.4 g/dL Final  . Alpha-2-Globulin 10/09/2014 0.7  0.4 - 1.0 g/dL Final  . Beta Globulin 10/09/2014 1.0  0.7 - 1.3 g/dL Final  . Gamma Globulin 10/09/2014 2.3* 0.4 - 1.8 g/dL Final  . M-Spike, % 10/09/2014 2.2* Not Observed g/dL Final  . SPE Interp. 10/09/2014 Comment   Final   Comment: (NOTE) The SPE pattern demonstrates a single peak (M-spike) in the gamma region which may represent monoclonal protein. This peak may also be caused by circulating immune complexes, cryoglobulins, C-reactive protein, fibrinogen or hemolysis.  If clinically indicated, the presence of a monoclonal gammopathy may be confirmed by immuno- fixation, as well as an evaluation of the urine for the presence of Bence-Jones protein. Performed At: [facility] [place], [place] [id number] Lindon Romp MD [id number]   . Comment 10/09/2014 Comment   Final   Comment: (NOTE) Protein electrophoresis scan will follow via computer, mail, or courier delivery.   Marland Kitchen GLOBULIN, TOTAL 10/09/2014 4.3* 2.2 - 3.9 g/dL Corrected  . A/G Ratio 10/09/2014 0.7  0.7 - 1.7 Corrected  . Kappa free light chain 10/09/2014 21.13* 3.30 - 19.40 mg/L Final  . Lamda free light chains 10/09/2014 128.29* 5.71 - 26.30 mg/L Final  . Kappa, lamda light chain ratio 10/09/2014 0.16* 0.26 - 1.65 Final   Comment: (NOTE) Performed At: [facility] [place], [place] [id number] Lindon Romp MD [id number]   . Anit Nuclear Antibody(ANA) 10/09/2014 Negative  Negative Final   Comment: (NOTE) Performed At: [facility] [place], [place] [id number] Lindon Romp MD [id number]   . IgG (Immunoglobin G), Serum 10/09/2014 2930* 700 - 1600 mg/dL Final  . IgA 10/09/2014 69  61 - 437 mg/dL Final  . IgM, Serum 10/09/2014 <6* 15 - 143 mg/dL Final   Comment: (NOTE) Performed At: [facility] [place], [place] [id number] Lindon Romp MD PI:[id number]   . Color, Urine 10/09/2014 YELLOW* YELLOW  Final  . APPearance 10/09/2014 CLEAR* CLEAR Final  . Glucose, UA 10/09/2014 NEGATIVE  NEGATIVE mg/dL Final  . Bilirubin Urine 10/09/2014 NEGATIVE  NEGATIVE Final  . Ketones, ur 10/09/2014 NEGATIVE  NEGATIVE mg/dL Final  . Specific Gravity, Urine 10/09/2014 1.020  1.005 - 1.030 Final  . Hgb urine dipstick 10/09/2014 NEGATIVE  NEGATIVE Final  . pH 10/09/2014 5.0  5.0 - 8.0 Final  . Protein, ur 10/09/2014 NEGATIVE  NEGATIVE mg/dL Final  . Nitrite 10/09/2014 NEGATIVE  NEGATIVE Final  . Leukocytes, UA 10/09/2014 NEGATIVE  NEGATIVE Final  . RBC / HPF 10/09/2014 NONE SEEN  0 - 5 RBC/hpf Final  . WBC, UA 10/09/2014 0-5  0 - 5 WBC/hpf Final  . Bacteria, UA 10/09/2014 NONE SEEN  NONE SEEN Final  . Squamous Epithelial / LPF 10/09/2014 NONE SEEN  NONE SEEN Final  . Mucous 10/09/2014 PRESENT   Final    Assessment:  KEMONTAE DUNKLEE is a 74 y.o. male with mild anemia since 10/2013 and mild thrombocytopenia since 05/2014.  Colonoscopy in 10/2013 polyps. EGD revealed gastritis.  He has not had a capsule study.  His diet is good.  Outside labs indicate some component of iron deficiency (ferritin 11.8 on 05/2014).  Symptomatically, the patient is fatigued. He has had intermittent diarrhea and abdominal discomfort. He has lost 10 pounds over the past year. He had pneumonia 05/2014.  Plan: 1. Review differential diagnosis of anemia and a workup. 2. Labs today:  CBC with differential, ferritin, iron studies, reticulocyte count, ANA, B12, folate, TSH, SPEP, free light chains, immunoglobulins. 3. Discuss additional workup based on results of above testing. 4. Return to clinic in 2 weeks for review of workup.  Addendum:  The patient was contacted regarding his abnormal SPEP. Additional testing was requested including a 24-hour urine (UPEP and free light chains) and bone  survey.  His follow-up appointment will be delayed secondary to his planned vacation   Lequita Asal, MD  10/09/2014

## 2014-11-02 ENCOUNTER — Inpatient Hospital Stay: Payer: PPO | Attending: Hematology and Oncology | Admitting: Hematology and Oncology

## 2014-11-02 ENCOUNTER — Inpatient Hospital Stay: Payer: PPO

## 2014-11-02 ENCOUNTER — Encounter: Payer: Self-pay | Admitting: Hematology and Oncology

## 2014-11-02 VITALS — BP 167/80 | HR 68 | Temp 98.2°F | Wt 190.3 lb

## 2014-11-02 DIAGNOSIS — I2510 Atherosclerotic heart disease of native coronary artery without angina pectoris: Secondary | ICD-10-CM | POA: Insufficient documentation

## 2014-11-02 DIAGNOSIS — Z7982 Long term (current) use of aspirin: Secondary | ICD-10-CM | POA: Insufficient documentation

## 2014-11-02 DIAGNOSIS — D472 Monoclonal gammopathy: Secondary | ICD-10-CM | POA: Insufficient documentation

## 2014-11-02 DIAGNOSIS — N400 Enlarged prostate without lower urinary tract symptoms: Secondary | ICD-10-CM | POA: Insufficient documentation

## 2014-11-02 DIAGNOSIS — E119 Type 2 diabetes mellitus without complications: Secondary | ICD-10-CM | POA: Insufficient documentation

## 2014-11-02 DIAGNOSIS — I252 Old myocardial infarction: Secondary | ICD-10-CM | POA: Insufficient documentation

## 2014-11-02 DIAGNOSIS — C9000 Multiple myeloma not having achieved remission: Secondary | ICD-10-CM | POA: Diagnosis not present

## 2014-11-02 DIAGNOSIS — Z79899 Other long term (current) drug therapy: Secondary | ICD-10-CM | POA: Insufficient documentation

## 2014-11-02 DIAGNOSIS — D509 Iron deficiency anemia, unspecified: Secondary | ICD-10-CM | POA: Insufficient documentation

## 2014-11-02 DIAGNOSIS — D519 Vitamin B12 deficiency anemia, unspecified: Secondary | ICD-10-CM

## 2014-11-02 DIAGNOSIS — D8989 Other specified disorders involving the immune mechanism, not elsewhere classified: Secondary | ICD-10-CM | POA: Insufficient documentation

## 2014-11-02 DIAGNOSIS — I10 Essential (primary) hypertension: Secondary | ICD-10-CM | POA: Insufficient documentation

## 2014-11-02 DIAGNOSIS — E780 Pure hypercholesterolemia: Secondary | ICD-10-CM | POA: Insufficient documentation

## 2014-11-02 DIAGNOSIS — D696 Thrombocytopenia, unspecified: Secondary | ICD-10-CM

## 2014-11-02 DIAGNOSIS — D649 Anemia, unspecified: Secondary | ICD-10-CM

## 2014-11-02 LAB — CBC WITH DIFFERENTIAL/PLATELET
Basophils Absolute: 0 10*3/uL (ref 0–0.1)
Basophils Relative: 1 %
Eosinophils Absolute: 0.3 10*3/uL (ref 0–0.7)
Eosinophils Relative: 6 %
HCT: 35.2 % — ABNORMAL LOW (ref 40.0–52.0)
Hemoglobin: 11.4 g/dL — ABNORMAL LOW (ref 13.0–18.0)
Lymphocytes Relative: 22 %
Lymphs Abs: 1.2 10*3/uL (ref 1.0–3.6)
MCH: 26.5 pg (ref 26.0–34.0)
MCHC: 32.5 g/dL (ref 32.0–36.0)
MCV: 81.5 fL (ref 80.0–100.0)
Monocytes Absolute: 0.7 10*3/uL (ref 0.2–1.0)
Monocytes Relative: 12 %
Neutro Abs: 3.2 10*3/uL (ref 1.4–6.5)
Neutrophils Relative %: 59 %
Platelets: 127 10*3/uL — ABNORMAL LOW (ref 150–440)
RBC: 4.31 MIL/uL — ABNORMAL LOW (ref 4.40–5.90)
RDW: 18.4 % — ABNORMAL HIGH (ref 11.5–14.5)
WBC: 5.4 10*3/uL (ref 3.8–10.6)

## 2014-11-02 LAB — FERRITIN: Ferritin: 14 ng/mL — ABNORMAL LOW (ref 24–336)

## 2014-11-02 LAB — PROTIME-INR
INR: 1.08
Prothrombin Time: 14.2 seconds (ref 11.4–15.0)

## 2014-11-02 NOTE — Progress Notes (Signed)
[facility] day: 11/02/2014   Chief Complaint: Zachary Buck is a 74 y.o. male  with anemia who is seen for review of initial work-up and discussion regarding direction of therapy.  HPI: The patient was last seen in the medical oncology clinic on 10/09/2014.  At that time, he was seen for initial consultation regarding anemia. Outside labs had suggested iron deficiency (ferritin 11.8). In addition he had a mild thrombocytopenia (platelets 122,000). He noted a history of pneumonia, the flu, and issues with diarrhea.  Creatinine was normal.  Regarding his anemia, he had undergone EGD and colonoscopy in 10/2013. He had one polyp with excessive bleeding. He had gastritis.  Workup on 10/09/2014 revealed a hematocrit 34.1, hemoglobin 11.3, MCV 80.4, platelets 118,000, white count 6200 with an [place] of 3900. Ferritin was 13. Iron studies included a saturation of 12%. TIBC was 370 (normal).  ANA negative. Folate was 18.2 (normal) and B12 was  291 (low normal). TSH was 3.627 (normal).  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 Serum immunoglobulins included an IgG of 2930 (high), IgA 69 and IgM less than 6. 24-hour urine was subsequently collected and revealed a 8.8% monoclonal protein (10.6 mg/24 hours) sees. Bone survey on 10/20/2014 revealed no lytic lesions.  Symptomatically, he feels about the same.  He has had no fevers or infections. He has had 1 episode of abdominal pain and diarrhea. His wife notes that he can become bloated and uncomfortable. Weight is stable.  He has a follow-up appointment with the GI. His diet is "okay". He is taking iron with vitamin C, one capsule twice a day.   Past Medical History  Diagnosis Date  . Hypercholesterolemia   . Atherosclerotic heart disease     s/p MI, s/p stent mid circumflex  . Hypertension   . Diabetes  mellitus without complication   . Clavicle fracture   . Leg fracture   . Myocardial infarct   . CAD (coronary artery disease)   . Prostate enlargement   . Internal hemorrhoids   . Anemia     Past Surgical History  Procedure Laterality Date  . Tonsillectomy    . Coronary angioplasty with stent placement      Family History  Problem Relation Age of Onset  . Aneurysm Father 67    of the heart  . Diabetes Mother   . Heart attack Father     Social History:  reports that he has never smoked. He has never used smokeless tobacco. He reports that he does not drink alcohol. His drug history is not on file.  Patient denies any exposure to radiation or toxins. The patient is accompanied by his wife, Zachary Buck, today.  Allergies:  Allergies  Allergen Reactions  . Niaspan [Niacin Er]     Current Medications: Current Outpatient Prescriptions  Medication Sig Dispense Refill  . aspirin 81 MG tablet Take 81 mg by mouth daily.    . blood glucose meter kit and supplies KIT Dispense based on patient and insurance preference. Use up to four times daily as directed. (FOR ICD-9 250.00, 250.01). 1 each 0  . Cholecalciferol (VITAMIN D-3) 1000 UNITS CAPS Take by mouth.    . Coenzyme Q10 (COQ-10) 100 MG CAPS Take by mouth daily.    Marland Kitchen glipiZIDE (GLUCOTROL) 5 MG tablet Use 5 mg in the morning and 10 mg in the evening. 270 tablet 1  .  glucose blood (ONE TOUCH ULTRA TEST) test strip Use as instructed 100 each 12  . iron polysaccharides (FERREX 150) 150 MG capsule Take 1 capsule (150 mg total) by mouth daily. 30 capsule 2  . Lancets (ONETOUCH ULTRASOFT) lancets Use as instructed 100 each 12  . levocarnitine (CARNITOR) 250 MG capsule Take 250 mg by mouth daily.    . metFORMIN (GLUCOPHAGE) 1000 MG tablet Take 1 tablet (1,000 mg total) by mouth 2 (two) times daily with a meal. 180 tablet 1  . mupirocin ointment (BACTROBAN) 2 % Place 1 application into the nose 2 (two) times daily. 22 g 0  . Omega-3 Fatty  Acids (FISH OIL) 1000 MG CAPS Take 3 capsules by mouth daily.    Marland Kitchen triamcinolone cream (KENALOG) 0.1 % Apply 1 application topically 2 (two) times daily. (Patient not taking: Reported on 10/09/2014) 30 g 0  . vitamin E (VITAMIN E) 400 UNIT capsule Take 400 Units by mouth daily.    . vitamin k 100 MCG tablet Take 100 mcg by mouth daily.     No current facility-administered medications for this visit.    Review of Systems:  GENERAL:  Feels tired.  No fevers or sweats.  Weight down 10 pounds in past year. PERFORMANCE STATUS (ECOG):  1 HEENT:  No visual changes, runny nose, sore throat, mouth sores or tenderness. Lungs: No shortness of breath or cough.  No hemoptysis. Cardiac:  No chest pain, palpitations, orthopnea, or PND. GI:  Intermittent diarrhea and stomach pain.  No nausea, vomiting, constipation, melena or hematochezia. GU:  No urgency, frequency, dysuria, or hematuria. Musculoskeletal:  No back pain.  No joint pain.  No muscle tenderness. Extremities:  No pain or swelling. Skin:  No rashes or skin changes. Neuro:  No headache, numbness or weakness, balance or coordination issues. Endocrine:  No diabetes, thyroid issues, hot flashes or night sweats. Psych:  No mood changes, depression or anxiety. Pain:  No focal pain. Review of systems:  All other systems reviewed and found to be negative.  Physical Exam: Blood pressure 167/80, pulse 68, temperature 98.2 F (36.8 C), temperature source Oral, weight 190 lb 4.1 oz (86.3 kg), SpO2 98 %. GENERAL:  Well developed, well nourished, sitting comfortably in the exam room in no acute distress. MENTAL STATUS:  Alert and oriented to person, place and time. HEAD:  Pearline Cables hair.  Normocephalic, atraumatic, face symmetric, no Cushingoid features. EYES:  Blue eyes.  No conjunctivitis or scleral icterus. NEUROLOGICAL: Unremarkable. PSYCH:  Appropriate.  Appointment on 11/02/2014  Component Date Value Ref Range Status  . WBC 11/02/2014 5.4  3.8 -  10.6 K/uL Final  . RBC 11/02/2014 4.31* 4.40 - 5.90 MIL/uL Final  . Hemoglobin 11/02/2014 11.4* 13.0 - 18.0 g/dL Final  . HCT 11/02/2014 35.2* 40.0 - 52.0 % Final  . MCV 11/02/2014 81.5  80.0 - 100.0 fL Final  . MCH 11/02/2014 26.5  26.0 - 34.0 pg Final  . MCHC 11/02/2014 32.5  32.0 - 36.0 g/dL Final  . RDW 11/02/2014 18.4* 11.5 - 14.5 % Final  . Platelets 11/02/2014 127* 150 - 440 K/uL Final  . Neutrophils Relative % 11/02/2014 59   Final  . Neutro Abs 11/02/2014 3.2  1.4 - 6.5 K/uL Final  . Lymphocytes Relative 11/02/2014 22   Final  . Lymphs Abs 11/02/2014 1.2  1.0 - 3.6 K/uL Final  . Monocytes Relative 11/02/2014 12   Final  . Monocytes Absolute 11/02/2014 0.7  0.2 - 1.0 K/uL Final  .  Eosinophils Relative 11/02/2014 6   Final  . Eosinophils Absolute 11/02/2014 0.3  0 - 0.7 K/uL Final  . Basophils Relative 11/02/2014 1   Final  . Basophils Absolute 11/02/2014 0.0  0 - 0.1 K/uL Final    Assessment:  Zachary Buck is a 74 y.o. male with mild anemia since 10/2013 and mild thrombocytopenia since 05/2014.  Colonoscopy in 10/2013 revealed polyps. EGD revealed gastritis.  He has not had a capsule study.  His diet is good.  Labs confirmed iron deficiency (ferritin 11.8 on 05/2014 and 13 on 10/09/2014).  He is on oral iron twice a day.  B12 is low normal.  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 Serum immunoglobulins noted an IgG of 2930 (high).  24-hour urine revealed a 8.8% monoclonal protein (10.6 mg/24 hours). Bone survey on 10/20/2014 revealed no lytic lesions.  Symptomatically, he is fatigued. He has had intermittent diarrhea and abdominal discomfort. He has lost 10 pounds over the past year. He had pneumonia in 05/2014.  Plan: 1. Review labs and diagnosis of iron deficiency and multiple myeloma.  Discuss staging, prognosis, and treatment of myeloma.  Discuss bone marrow aspirate and  biopsy.  Send sample for pathologic review, Congo red stain, flow cytometry, cytogenetics, and FISH studies. 2. Follow-up with gastroenterology. 3. Continue ferrous sulfate BID. 4. Labs today:  CBC with diff, PT/INR, MMA, ferritin. 5. Schedule bone marrow aspirate and biopsy with interventional radiology. 6. Phone follow-up with pathology-done. 7. RTC 10 days after bone marrow to finalize treatment plan.   Lequita Asal, MD  11/02/2014, 11:29 AM

## 2014-11-02 NOTE — Progress Notes (Signed)
Patient is here for follow up and to review lab

## 2014-11-04 ENCOUNTER — Encounter: Payer: Self-pay | Admitting: Internal Medicine

## 2014-11-04 LAB — METHYLMALONIC ACID, SERUM: Methylmalonic Acid, Quantitative: 131 nmol/L (ref 0–378)

## 2014-11-09 ENCOUNTER — Other Ambulatory Visit: Payer: Self-pay | Admitting: Radiology

## 2014-11-10 ENCOUNTER — Ambulatory Visit: Payer: PPO | Admitting: Gastroenterology

## 2014-11-10 ENCOUNTER — Ambulatory Visit
Admission: RE | Admit: 2014-11-10 | Discharge: 2014-11-10 | Disposition: A | Discharge status: Home / Self Care | Payer: PPO | Source: Ambulatory Visit | Attending: Hematology and Oncology | Admitting: Hematology and Oncology

## 2014-11-10 DIAGNOSIS — D472 Monoclonal gammopathy: Secondary | ICD-10-CM | POA: Diagnosis not present

## 2014-11-10 DIAGNOSIS — D8989 Other specified disorders involving the immune mechanism, not elsewhere classified: Secondary | ICD-10-CM

## 2014-11-10 DIAGNOSIS — C9000 Multiple myeloma not having achieved remission: Secondary | ICD-10-CM | POA: Insufficient documentation

## 2014-11-10 LAB — CBC
HCT: 34.2 % — ABNORMAL LOW (ref 40.0–52.0)
Hemoglobin: 11.3 g/dL — ABNORMAL LOW (ref 13.0–18.0)
MCH: 27.1 pg (ref 26.0–34.0)
MCHC: 33.1 g/dL (ref 32.0–36.0)
MCV: 81.9 fL (ref 80.0–100.0)
PLATELETS: 108 10*3/uL — AB (ref 150–440)
RBC: 4.17 MIL/uL — AB (ref 4.40–5.90)
RDW: 18.3 % — AB (ref 11.5–14.5)
WBC: 5.7 10*3/uL (ref 3.8–10.6)

## 2014-11-10 LAB — DIFFERENTIAL
Band Neutrophils: 0 %
Basophils Absolute: 0 10*3/uL (ref 0–0.1)
Basophils Relative: 1 %
Basophils Relative: 0 %
Blasts: 0 %
Eosinophils Absolute: 0.4 10*3/uL (ref 0–0.7)
Eosinophils Relative: 7 %
Eosinophils Relative: 7 %
Lymphocytes Relative: 19 %
Lymphocytes Relative: 20 %
Lymphs Abs: 1.1 10*3/uL (ref 1.0–3.6)
Metamyelocytes Relative: 0 %
Monocytes Absolute: 0.7 10*3/uL (ref 0.2–1.0)
Monocytes Relative: 12 %
Monocytes Relative: 10 %
Myelocytes: 0 %
Neutro Abs: 3.5 10*3/uL (ref 1.4–6.5)
Neutrophils Relative %: 61 %
Neutrophils Relative %: 63 %
Other: 0 %
Promyelocytes Absolute: 0 %
nRBC: 0 /100 WBC

## 2014-11-10 LAB — PROTIME-INR
INR: 0.99
Prothrombin Time: 13.3 seconds (ref 11.4–15.0)

## 2014-11-10 LAB — GLUCOSE, CAPILLARY: Glucose-Capillary: 187 mg/dL — ABNORMAL HIGH (ref 65–99)

## 2014-11-10 LAB — APTT: aPTT: 28 seconds (ref 24–36)

## 2014-11-10 MED ORDER — HEPARIN SOD (PORK) LOCK FLUSH 100 UNIT/ML IV SOLN
INTRAVENOUS | Status: AC
  Filled 2014-11-10: qty 5

## 2014-11-10 MED ORDER — SODIUM CHLORIDE 0.9 % IV SOLN
INTRAVENOUS | Status: DC
  Administered 2014-11-10: 09:00:00 via INTRAVENOUS

## 2014-11-10 MED ORDER — HYDROCODONE-ACETAMINOPHEN 5-325 MG PO TABS
1.0000 | ORAL_TABLET | ORAL | Status: DC | PRN
  Filled 2014-11-10: qty 2

## 2014-11-10 MED ORDER — MIDAZOLAM HCL 5 MG/5ML IJ SOLN
INTRAMUSCULAR | Status: AC
  Filled 2014-11-10: qty 5

## 2014-11-10 MED ORDER — FENTANYL CITRATE (PF) 100 MCG/2ML IJ SOLN
INTRAMUSCULAR | Status: AC
  Filled 2014-11-10: qty 2

## 2014-11-10 NOTE — Discharge Instructions (Signed)
Bone Marrow Aspiration and Bone Biopsy Examination of the bone marrow is a valuable test to diagnose blood disorders. A bone marrow biopsy takes a sample of bone and a small amount of fluid and cells from inside the bone. A bone marrow aspiration removes only the marrow. Bone marrow aspiration and bone biopsies are used to stage different disorders of the blood, such as leukemia. Staging will help your caregiver understand how far the disease has progressed.  The tests are also useful in diagnosing:  Fever of unknown origin (FUO).  Bacterial infections and other widespread fungal infections.  Cancers that have spread (metastasized) to the bone marrow.  Diseases that are characterized by a deficiency of an enzyme (storage diseases). This includes:  Niemann-Pick disease.  Gaucher disease. PROCEDURE  Sites used to get samples include:   Back of your hip bone (posterior iliac crest).  Both aspiration and biopsy.  Front of your hip bone (anterior iliac crest).  Both aspiration and biopsy.  Breastbone (sternum).  Aspiration from your breastbone (done only in adults). This method is rarely used. When you get a hip bone aspiration:  You are placed lying on your side with the upper knee brought up and flexed with the lower leg straight.  The site is prepared, cleaned with an antiseptic scrub, and draped. This keeps the biopsy area clean.  The skin and the area down to the lining of the bone (periosteum) are made numb with a local anesthetic.  The bone marrow aspiration needle is inserted. You will feel pressure on your bone.  Once inside the marrow cavity, a sample of bone marrow is sucked out (aspirated) for pathology slides.  The material collected for bone marrow slides is processed immediately by a technologist.  The technician selects the marrow particles to make the slides for pathology.  The marrow aspiration needle is removed. Then pressure is applied to the site with  gauze until bleeding has stopped. Following an aspiration, a bone marrow biopsy may be performed as well. The technique for this is very similar. A dressing is then applied.  RISKS AND COMPLICATIONS  The main complications of a bone marrow aspiration and biopsy include infection and bleeding.  Complications are uncommon. The procedure may not be performed in patients with bleeding tendencies.  A very rare complication from the procedure is injury to the heart during a breastbone (sternal) marrow aspiration. Only bone marrow aspirations are performed in this area.  Long-lasting pain at the site of the bone marrow aspiration and biopsy is uncommon. Your caregiver will let you know when you are to get your results and will discuss them with you. You may make an appointment with your caregiver to find out the results. Do not assume everything is normal if you have not heard from your caregiver or the medical facility. It is important for you to follow up on all of your test results. Document Released: 02/10/2004 Document Revised: 05/01/2011 Document Reviewed: 02/04/2008 [facility] Patient Information 2015 [place], [place]. This information is not intended to replace advice given to you by your health care provider. Make sure you discuss any questions you have with your health care provider.

## 2014-11-10 NOTE — Procedures (Signed)
CT guided bone marrow biopsy  Complications:  None  Blood Loss: none  See dictation in canopy pacs

## 2014-11-16 ENCOUNTER — Encounter: Payer: Self-pay | Admitting: Internal Medicine

## 2014-11-16 ENCOUNTER — Ambulatory Visit (INDEPENDENT_AMBULATORY_CARE_PROVIDER_SITE_OTHER): Payer: PPO | Admitting: Internal Medicine

## 2014-11-16 ENCOUNTER — Other Ambulatory Visit (INDEPENDENT_AMBULATORY_CARE_PROVIDER_SITE_OTHER): Payer: PPO

## 2014-11-16 ENCOUNTER — Telehealth: Payer: Self-pay | Admitting: *Deleted

## 2014-11-16 VITALS — BP 140/72 | HR 80 | Ht 69.0 in | Wt 184.0 lb

## 2014-11-16 DIAGNOSIS — Z86010 Personal history of colonic polyps: Secondary | ICD-10-CM

## 2014-11-16 DIAGNOSIS — D509 Iron deficiency anemia, unspecified: Secondary | ICD-10-CM

## 2014-11-16 DIAGNOSIS — K2970 Gastritis, unspecified, without bleeding: Secondary | ICD-10-CM | POA: Diagnosis not present

## 2014-11-16 DIAGNOSIS — R195 Other fecal abnormalities: Secondary | ICD-10-CM

## 2014-11-16 DIAGNOSIS — R109 Unspecified abdominal pain: Secondary | ICD-10-CM | POA: Diagnosis not present

## 2014-11-16 DIAGNOSIS — C9000 Multiple myeloma not having achieved remission: Secondary | ICD-10-CM

## 2014-11-16 MED ORDER — HYOSCYAMINE SULFATE 0.125 MG PO TABS: ORAL_TABLET | ORAL | Status: DC

## 2014-11-16 MED ORDER — PANTOPRAZOLE SODIUM 40 MG PO TBEC: 40.0000 mg | DELAYED_RELEASE_TABLET | Freq: Every day | ORAL | Status: DC

## 2014-11-16 NOTE — Patient Instructions (Addendum)
We have sent the following medications to your pharmacy for you to pick up at your convenience: Pantoprazole 40 mg daily Levsin 0.125 mg- take 1-2 tablets every 4-6 hours as needed for crampy abdominal pain  We have suggested you have a capsule endoscopy procedure. Per your request, we did not schedule that today. Please call our office at [phone] should you decide to have this completed.  Your physician has requested that you go to the basement for the following lab work before leaving today: Celiac tests

## 2014-11-16 NOTE — Telephone Encounter (Signed)
GI wants to do capsule endoscopy, but he needs to be off his iron before they will even schedule it. He is requesting Dr Mike Gip call him to discuss this with him

## 2014-11-16 NOTE — Progress Notes (Signed)
Patient ID: Zachary Buck, male   DOB: 1940/11/13, 74 y.o.   MRN: [id number] HPI: Mr. Goodall is a 74 year old male with a new diagnosis of multiple myeloma who is seen in consultation at the request of Dr. Nicki Reaper to evaluate heme positive stool, abdominal pain, intermittent loose stools, and iron deficiency anemia. He is here today with his wife. He also has a history of hypertension, hyperlipidemia, diabetes, CAD. He reports that he has been evaluated in [place] by [place] in 2015 with upper endoscopy and colonoscopy. These records have been received and reviewed. See below.  He reports that he has been having issue with iron deficiency anemia and has been started on oral iron supplementation. He is taking ferrous sulfate 325 mg twice daily. He has been dealing with pain in his upper and midabdomen which is been ongoing. It is associated with nausea and bloating. No vomiting. No heartburn, dysphagia or odynophagia. Pain is sometimes better with eating particularly if he eats ice cream. When the pain becomes intense it often leads to diarrhea once he has diarrhea he will have resolution of this pain. Pain is not always associated with diarrhea or loose stools. In between his stools have been more formed. He has not seen visible blood or melena. His stools are dark when taking oral iron. He has lost 78 pounds in the last year but closer to 40 pounds in the last 4-5 years. The 40 to weight loss he reports was intentional. He denies NSAID use  He has been seen by Dr. Mike Gip and recently undergone bone marrow biopsy with the suspicion of multiple myeloma based on serum and urine testing. These biopsy results remain pending. He has follow-up with her next week.  His GI evaluation including stool studies which was negative for C. difficile, culture and ova and parasite.  Upper endoscopy was performed on 11/18/2013 -- this showed a normal esophagus, congestion and gastritis in the gastric  body and minimal inflammation and scattered erosions in the antrum. This was biopsied. Normal duodenum. 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 H. pylori negative.  Colonoscopy performed 11/18/2013 -- 4 mm polyp at splenic flexure, 3 mm polyp in descending colon, 2 mm polyp in descending colon, 3 mm polyp in mid sigmoid colon. Nonbleeding internal hemorrhoids. Random biopsies. Pathology = random colon biopsies negative. Tubular adenoma in all removed colon polyps without high-grade dysplasia or malignancy.  CT scan abdomen and pelvis 10/17/2013 -- mild prominence of the distal periesophageal and perigastric vasculature. No CT evidence of cirrhosis. No splenomegaly. Portal vein is patent. Nodularity along the right pleural base consistent with prior inflammatory change. Prostate enlargement. CAD. Cardiomegaly.  Small bowel follow-through 12/09/2013 -- moderate stool burden throughout the colon. No obstruction. Normal study.  Past Medical History  Diagnosis Date  . Hypercholesterolemia   . Atherosclerotic heart disease     s/p MI, s/p stent mid circumflex  . Hypertension   . Diabetes mellitus without complication   . Clavicle fracture   . Leg fracture   . Myocardial infarct   . CAD (coronary artery disease)   . Prostate enlargement   . Internal hemorrhoids   . Anemia     Past Surgical History  Procedure Laterality Date  . Tonsillectomy    . Coronary angioplasty with stent placement      Outpatient Prescriptions Prior to Visit  Medication Sig Dispense Refill  . aspirin 81 MG tablet Take 81 mg by mouth daily.    Marland Kitchen  blood glucose meter kit and supplies KIT Dispense based on patient and insurance preference. Use up to four times daily as directed. (FOR ICD-9 250.00, 250.01). 1 each 0  . Cholecalciferol (VITAMIN D-3) 1000 UNITS CAPS Take by mouth.    . Coenzyme Q10 (COQ-10) 100 MG CAPS Take by mouth daily.    Burnadette Peter OIL by Does not apply  route.    Marland Kitchen glipiZIDE (GLUCOTROL) 5 MG tablet Use 5 mg in the morning and 10 mg in the evening. 270 tablet 1  . glucose blood (ONE TOUCH ULTRA TEST) test strip Use as instructed 100 each 12  . iron polysaccharides (FERREX 150) 150 MG capsule Take 1 capsule (150 mg total) by mouth daily. 30 capsule 2  . Lancets (ONETOUCH ULTRASOFT) lancets Use as instructed 100 each 12  . levocarnitine (CARNITOR) 250 MG capsule Take 250 mg by mouth daily.    Marland Kitchen lisinopril (PRINIVIL,ZESTRIL) 5 MG tablet Take 5 mg by mouth daily.    . metFORMIN (GLUCOPHAGE) 1000 MG tablet Take 1 tablet (1,000 mg total) by mouth 2 (two) times daily with a meal. 180 tablet 1  . mupirocin ointment (BACTROBAN) 2 % Place 1 application into the nose 2 (two) times daily. 22 g 0  . Omega-3 Fatty Acids (FISH OIL) 1000 MG CAPS Take 3 capsules by mouth daily.    Marland Kitchen triamcinolone cream (KENALOG) 0.1 % Apply 1 application topically 2 (two) times daily. 30 g 0  . Turmeric Curcumin 500 MG CAPS Take 3 capsules by mouth 3 (three) times daily.    . vitamin E (VITAMIN E) 400 UNIT capsule Take 400 Units by mouth daily.    . vitamin k 100 MCG tablet Take 100 mcg by mouth daily.     No facility-administered medications prior to visit.    Allergies  Allergen Reactions  . Niaspan [Niacin Er]     Family History  Problem Relation Age of Onset  . Aneurysm Father 52    of the heart  . Diabetes Mother   . Heart attack Father     Social History  Substance Use Topics  . Smoking status: Never Smoker   . Smokeless tobacco: Never Used  . Alcohol Use: No    ROS: As per history of present illness, otherwise negative  BP 140/72 mmHg  Pulse 80  Ht 5' 9"  (1.753 m)  Wt 184 lb (83.462 kg)  BMI 27.16 kg/m2 Constitutional: Well-developed and well-nourished. No distress. HEENT: Normocephalic and atraumatic. Oropharynx is clear and moist. No oropharyngeal exudate. Conjunctivae are normal.  No scleral icterus. Neck: Neck supple. Trachea  midline. Cardiovascular: Normal rate, regular rhythm and intact distal pulses. No M/R/G Pulmonary/chest: Effort normal and breath sounds normal. No wheezing, rales or rhonchi. Abdominal: Soft, diffuse abdominal tenderness without rebound or guarding, nondistended. Bowel sounds active throughout. There are no masses palpable. No hepatosplenomegaly. Extremities: no clubbing, cyanosis, or edema Lymphadenopathy: No cervical adenopathy noted. Neurological: Alert and oriented to person place and time. Skin: Skin is warm and dry. No rashes noted. Psychiatric: Normal mood and affect. Behavior is normal.  RELEVANT LABS AND IMAGING: CBC    Component Value Date/Time   WBC 5.7 11/10/2014 0836   WBC 6.0 11/18/2013 1053   RBC 4.17* 11/10/2014 0836   RBC 4.24* 10/09/2014 1056   RBC 4.80 11/18/2013 1053   HGB 11.3* 11/10/2014 0836   HGB 13.1 11/18/2013 1053   HCT 34.2* 11/10/2014 0836   HCT 40.4 11/18/2013 1053   PLT 108* 11/10/2014 7253  PLT 152 11/18/2013 1053   MCV 81.9 11/10/2014 0836   MCV 84 11/18/2013 1053   MCH 27.1 11/10/2014 0836   MCH 27.3 11/18/2013 1053   MCHC 33.1 11/10/2014 0836   MCHC 32.5 11/18/2013 1053   RDW 18.3* 11/10/2014 0836   RDW 14.9* 11/18/2013 1053   LYMPHSABS 1.1 11/10/2014 0836   MONOABS 0.7 11/10/2014 0836   EOSABS 0.4 11/10/2014 0836   BASOSABS 0.0 11/10/2014 0836    CMP     Component Value Date/Time   NA 137 08/26/2014 0854   K 4.3 08/26/2014 0854   CL 105 08/26/2014 0854   CO2 25 08/26/2014 0854   GLUCOSE 164* 08/26/2014 0854   BUN 19 08/26/2014 0854   CREATININE 1.19 08/26/2014 0854   CREATININE 1.43* 08/30/2012 1615   CALCIUM 9.3 08/26/2014 0854   PROT 7.5 08/26/2014 0854   PROT 8.3* 11/18/2013 1053   ALBUMIN 3.3* 08/26/2014 0854   ALBUMIN 3.3* 11/18/2013 1053   AST 24 08/26/2014 0854   AST 29 11/18/2013 1053   ALT 23 08/26/2014 0854   ALT 36 11/18/2013 1053   ALKPHOS 51 08/26/2014 0854   ALKPHOS 58 11/18/2013 1053   BILITOT 0.6  08/26/2014 0854   BILITOT 0.6 11/18/2013 1053   GFRNONAA 49* 08/30/2012 1615   GFRAA 56* 08/30/2012 1615   Iron/TIBC/Ferritin/ %Sat    Component Value Date/Time   IRON 46 10/09/2014 1056   TIBC 370 10/09/2014 1056   FERRITIN 14* 11/02/2014 1108   IRONPCTSAT 12* 10/09/2014 1056     ASSESSMENT/PLAN:  74 year old male with a new diagnosis of multiple myeloma who is seen in consultation at the request of Dr. Nicki Reaper to evaluate heme positive stool, abdominal pain, intermittent loose stools, and iron deficiency anemia.  1. Abd pain/IDA/heme + stools/hx of gastritis -- he has a significant iron deficiency anemia and heme positive stool. He had active gastritis and erosions in the gastric antrum last year which I do not think has been addressed. H. pylori was negative. I recommended a trial of PPI therapy pantoprazole 40 mg daily to see if this helps with abdominal pain and nausea. I will also give him Levsin 0.1251-2 tabs every 4-6 hours as needed for appendectomy abdominal pain. Bacterial overgrowth was considered but would like to treat for gastritis to see if symptoms improve first. I recommended a video capsule endoscopy to evaluate the small bowel for source of heme positive stools and iron deficiency given relatively negative Endo and Colon within the past year. Celiac panel today by blood test. Continue oral iron supplementation and discuss IV iron with Dr. Mike Gip. This may be a quicker way to boost iron stores and improve fatigue and anemia. We discussed the risks, benefits and alternatives to capsule endoscopy and he is agreeable to proceed. Follow-up in 3 months  2. History of colon polyps -- adenomatous would be due recall in October 2018    EQ:[id number] [address] [place], [place] [id number]

## 2014-11-17 ENCOUNTER — Telehealth: Payer: Self-pay | Admitting: Internal Medicine

## 2014-11-17 ENCOUNTER — Telehealth: Payer: Self-pay

## 2014-11-17 ENCOUNTER — Other Ambulatory Visit: Payer: Self-pay

## 2014-11-17 DIAGNOSIS — D509 Iron deficiency anemia, unspecified: Secondary | ICD-10-CM

## 2014-11-17 LAB — TISSUE TRANSGLUTAMINASE, IGA: Tissue Transglutaminase Ab, IgA: 1 U/mL (ref <4)

## 2014-11-17 LAB — IGA: IGA: 67 mg/dL — AB (ref 68–378)

## 2014-11-17 NOTE — Telephone Encounter (Signed)
  Did he get the call?  M

## 2014-11-17 NOTE — Telephone Encounter (Signed)
Called pt per MD.  MD is fine with pt stopping oral iron for pt to do the capsule endoscopy.  PT verbalized an understanding.

## 2014-11-17 NOTE — Telephone Encounter (Signed)
Pt scheduled for capsule endo 11/27/14@8am . Pt aware of appt. Per pt cardiologist told him it was ok for him to hold his iron for the capsule endo. Pts wife wanted to let Dr. Hilarie Fredrickson know that he was tested for h pylori last year and it was negative.

## 2014-11-24 ENCOUNTER — Inpatient Hospital Stay: Payer: PPO

## 2014-11-24 ENCOUNTER — Ambulatory Visit: Payer: PPO

## 2014-11-24 ENCOUNTER — Encounter: Payer: Self-pay | Admitting: Hematology and Oncology

## 2014-11-24 ENCOUNTER — Inpatient Hospital Stay: Payer: PPO | Attending: Hematology and Oncology | Admitting: Hematology and Oncology

## 2014-11-24 VITALS — BP 155/78 | HR 71 | Temp 99.6°F | Ht 69.0 in | Wt 189.9 lb

## 2014-11-24 DIAGNOSIS — I2510 Atherosclerotic heart disease of native coronary artery without angina pectoris: Secondary | ICD-10-CM | POA: Diagnosis not present

## 2014-11-24 DIAGNOSIS — K766 Portal hypertension: Secondary | ICD-10-CM | POA: Diagnosis not present

## 2014-11-24 DIAGNOSIS — E119 Type 2 diabetes mellitus without complications: Secondary | ICD-10-CM | POA: Diagnosis not present

## 2014-11-24 DIAGNOSIS — C9000 Multiple myeloma not having achieved remission: Secondary | ICD-10-CM | POA: Diagnosis present

## 2014-11-24 DIAGNOSIS — I10 Essential (primary) hypertension: Secondary | ICD-10-CM | POA: Diagnosis not present

## 2014-11-24 DIAGNOSIS — I252 Old myocardial infarction: Secondary | ICD-10-CM | POA: Diagnosis not present

## 2014-11-24 DIAGNOSIS — E7800 Pure hypercholesterolemia, unspecified: Secondary | ICD-10-CM | POA: Insufficient documentation

## 2014-11-24 DIAGNOSIS — K7460 Unspecified cirrhosis of liver: Secondary | ICD-10-CM | POA: Diagnosis not present

## 2014-11-24 DIAGNOSIS — D472 Monoclonal gammopathy: Secondary | ICD-10-CM

## 2014-11-24 DIAGNOSIS — Z79899 Other long term (current) drug therapy: Secondary | ICD-10-CM | POA: Diagnosis not present

## 2014-11-24 DIAGNOSIS — Z7982 Long term (current) use of aspirin: Secondary | ICD-10-CM | POA: Insufficient documentation

## 2014-11-24 DIAGNOSIS — D509 Iron deficiency anemia, unspecified: Secondary | ICD-10-CM | POA: Diagnosis not present

## 2014-11-24 DIAGNOSIS — D696 Thrombocytopenia, unspecified: Secondary | ICD-10-CM

## 2014-11-24 DIAGNOSIS — N400 Enlarged prostate without lower urinary tract symptoms: Secondary | ICD-10-CM | POA: Insufficient documentation

## 2014-11-24 LAB — COMPREHENSIVE METABOLIC PANEL
ALT: 29 U/L (ref 17–63)
AST: 28 U/L (ref 15–41)
Albumin: 3.5 g/dL (ref 3.5–5.0)
Alkaline Phosphatase: 62 U/L (ref 38–126)
Anion gap: 5 (ref 5–15)
BUN: 21 mg/dL — ABNORMAL HIGH (ref 6–20)
CO2: 23 mmol/L (ref 22–32)
Calcium: 9 mg/dL (ref 8.9–10.3)
Chloride: 104 mmol/L (ref 101–111)
Creatinine, Ser: 1.15 mg/dL (ref 0.61–1.24)
GFR calc Af Amer: >60 mL/min (ref >60)
GFR calc non Af Amer: >60 mL/min (ref >60)
Glucose, Bld: 220 mg/dL — ABNORMAL HIGH (ref 65–99)
Potassium: 4.5 mmol/L (ref 3.5–5.1)
Sodium: 132 mmol/L — ABNORMAL LOW (ref 135–145)
Total Bilirubin: 0.8 mg/dL (ref 0.3–1.2)
Total Protein: 8.4 g/dL — ABNORMAL HIGH (ref 6.5–8.1)

## 2014-11-24 LAB — CBC WITH DIFFERENTIAL/PLATELET
Basophils Absolute: 0 10*3/uL (ref 0–0.1)
Basophils Relative: 1 %
Eosinophils Absolute: 0.4 10*3/uL (ref 0–0.7)
Eosinophils Relative: 6 %
HCT: 36.2 % — ABNORMAL LOW (ref 40.0–52.0)
Hemoglobin: 12.1 g/dL — ABNORMAL LOW (ref 13.0–18.0)
Lymphocytes Relative: 23 %
Lymphs Abs: 1.3 10*3/uL (ref 1.0–3.6)
MCH: 27.3 pg (ref 26.0–34.0)
MCHC: 33.5 g/dL (ref 32.0–36.0)
MCV: 81.6 fL (ref 80.0–100.0)
Monocytes Absolute: 0.7 10*3/uL (ref 0.2–1.0)
Monocytes Relative: 11 %
Neutro Abs: 3.4 10*3/uL (ref 1.4–6.5)
Neutrophils Relative %: 59 %
Platelets: 113 10*3/uL — ABNORMAL LOW (ref 150–440)
RBC: 4.44 MIL/uL (ref 4.40–5.90)
RDW: 17.7 % — ABNORMAL HIGH (ref 11.5–14.5)
WBC: 5.9 10*3/uL (ref 3.8–10.6)

## 2014-11-24 LAB — LACTATE DEHYDROGENASE: LDH: 156 U/L (ref 98–192)

## 2014-11-24 NOTE — Progress Notes (Signed)
[facility] day: 11/24/2014   Chief Complaint: Zachary Buck is a 74 y.o. male  with anemia who is seen for review of interval bone marrow and discussion regarding direction of therapy.  HPI: The patient was last seen in the medical oncology clinic on 11/02/2014.  At that time, workup was reviewed. He had iron deficiency anemia with a ferritin of 13 and iron saturation of 12%.  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 Serum immunoglobulins included an IgG of 2930 (high), IgA 69 and IgM less than 6. 24-hour urine was subsequently collected and revealed a 8.8% monoclonal protein (10.6 mg/24 hours) sees. Bone survey on 10/20/2014 revealed no lytic lesions.  Patient underwent a bone marrow aspirate and biopsy on 11/10/2014.  Pathology revealed a 10% monoclonal plasma cell infiltrate.  Marrow was hypercellular for age (40-50%) with mixed maturing hematopoiesis, relative erythroid hyperplasia and mild nonspecific dyserythropoiesis. There was patchy mild focally moderate increase in reticulin. There were no significant iron stores. Myeloma FISH panel revealed translocation (11;14) which results in fusion of CCND1 (BCL1) at 11q13 with the immunoglobulin heavy chain gene (IgH) at 14q32.   This is a favorable prognostic feature.  The patient states that he is scheduled for a capsule study on 11/27/2014. He is currently holding his oral iron because of his capsule enteroscopy, but is interested in IV iron after talking to his gastroenterologist. He feels a little bit better at since he has been on oral iron.  Past Medical History  Diagnosis Date  . Hypercholesterolemia   . Atherosclerotic heart disease     s/p MI, s/p stent mid circumflex  . Hypertension   . Diabetes mellitus without complication   . Clavicle fracture   . Leg fracture   . Myocardial infarct   . CAD (coronary artery disease)   .  Prostate enlargement   . Internal hemorrhoids   . Anemia     Past Surgical History  Procedure Laterality Date  . Tonsillectomy    . Coronary angioplasty with stent placement      Family History  Problem Relation Age of Onset  . Aneurysm Father 46    of the heart  . Diabetes Mother   . Heart attack Father     Social History:  reports that he has never smoked. He has never used smokeless tobacco. He reports that he does not drink alcohol. His drug history is not on file.  Patient denies any exposure to radiation or toxins. The patient is accompanied by his wife, [name], today.  Allergies:  Allergies  Allergen Reactions  . Niaspan [Niacin Er]     Current Medications: Current Outpatient Prescriptions  Medication Sig Dispense Refill  . blood glucose meter kit and supplies KIT Dispense based on patient and insurance preference. Use up to four times daily as directed. (FOR ICD-9 250.00, 250.01). 1 each 0  . Cholecalciferol (VITAMIN D-3) 1000 UNITS CAPS Take by mouth.    Burnadette Peter OIL by Does not apply route.    Marland Kitchen glipiZIDE (GLUCOTROL) 5 MG tablet Use 5 mg in the morning and 10 mg in the evening. 270 tablet 1  . glucose blood (ONE TOUCH ULTRA TEST) test strip Use as instructed 100 each 12  . Lancets (ONETOUCH ULTRASOFT) lancets Use as instructed 100 each 12  . lisinopril (PRINIVIL,ZESTRIL) 5 MG tablet Take 5 mg by mouth daily.    . metFORMIN (GLUCOPHAGE) 1000 MG  tablet Take 1 tablet (1,000 mg total) by mouth 2 (two) times daily with a meal. 180 tablet 1  . pantoprazole (PROTONIX) 40 MG tablet Take 1 tablet (40 mg total) by mouth daily. 3 tablet 3  . vitamin E (VITAMIN E) 400 UNIT capsule Take 400 Units by mouth daily.    Marland Kitchen aspirin 81 MG tablet Take 81 mg by mouth daily.    . Coenzyme Q10 (COQ-10) 100 MG CAPS Take by mouth daily.    . hyoscyamine (LEVSIN) 0.125 MG tablet Take 1-2 tablets by mouth every 4-6 hours as needed for crampy abdominal pain (Patient not taking: Reported  on 11/24/2014) 60 tablet 1  . iron polysaccharides (FERREX 150) 150 MG capsule Take 1 capsule (150 mg total) by mouth daily. (Patient not taking: Reported on 11/24/2014) 30 capsule 2  . levocarnitine (CARNITOR) 250 MG capsule Take 250 mg by mouth daily.    . mupirocin ointment (BACTROBAN) 2 % Place 1 application into the nose 2 (two) times daily. (Patient not taking: Reported on 11/24/2014) 22 g 0  . Omega-3 Fatty Acids (FISH OIL) 1000 MG CAPS Take 3 capsules by mouth daily.    Marland Kitchen triamcinolone cream (KENALOG) 0.1 % Apply 1 application topically 2 (two) times daily. (Patient not taking: Reported on 11/24/2014) 30 g 0  . Turmeric Curcumin 500 MG CAPS Take 3 capsules by mouth 3 (three) times daily.    . vitamin k 100 MCG tablet Take 100 mcg by mouth daily.     No current facility-administered medications for this visit.    Review of Systems:  GENERAL:  Feels better.  No fevers or sweats.  Weight stable. PERFORMANCE STATUS (ECOG):  1 HEENT:  No visual changes, runny nose, sore throat, mouth sores or tenderness. Lungs: No shortness of breath or cough.  No hemoptysis. Cardiac:  No chest pain, palpitations, orthopnea, or PND. GI:  Intermittent diarrhea and stomach pain.  No nausea, vomiting, constipation, melena or hematochezia. GU:  No urgency, frequency, dysuria, or hematuria. Musculoskeletal:  No back pain.  No joint pain.  No muscle tenderness. Extremities:  No pain or swelling. Skin:  No rashes or skin changes. Neuro:  No headache, numbness or weakness, balance or coordination issues. Endocrine:  No diabetes, thyroid issues, hot flashes or night sweats. Psych:  No mood changes, depression or anxiety. Pain:  No focal pain. Review of systems:  All other systems reviewed and found to be negative.  Physical Exam: Blood pressure 155/78, pulse 71, temperature 99.6 F (37.6 C), temperature source Oral, height 5' 9"  (1.753 m), weight 189 lb 14.8 oz (86.15 kg), SpO2 97 %. GENERAL:  Well developed,  well nourished, sitting comfortably in the exam room in no acute distress. MENTAL STATUS:  Alert and oriented to person, place and time. HEAD:  Pearline Cables hair.  Normocephalic, atraumatic, face symmetric, no Cushingoid features. EYES:  Blue eyes.  Pupils equal round and reactive to light and accomodation.  No conjunctivitis or scleral icterus. ENT:  Oropharynx clear without lesion.  Tongue normal. Mucous membranes moist.  RESPIRATORY:  Clear to auscultation without rales, wheezes or rhonchi. CARDIOVASCULAR:  Regular rate and rhythm without murmur, rub or gallop. ABDOMEN:  Soft, non-tender, with active bowel sounds, and no hepatosplenomegaly.  No masses. SKIN:  No rashes, ulcers or lesions. EXTREMITIES: No edema, no skin discoloration or tenderness.  No palpable cords. LYMPH NODES: No palpable cervical, supraclavicular, axillary or inguinal adenopathy  NEUROLOGICAL: Unremarkable. PSYCH:  Appropriate.  Appointment on 11/24/2014  Component Date  Value Ref Range Status  . WBC 11/24/2014 5.9  3.8 - 10.6 K/uL Final  . RBC 11/24/2014 4.44  4.40 - 5.90 MIL/uL Final  . Hemoglobin 11/24/2014 12.1* 13.0 - 18.0 g/dL Final  . HCT 11/24/2014 36.2* 40.0 - 52.0 % Final  . MCV 11/24/2014 81.6  80.0 - 100.0 fL Final  . MCH 11/24/2014 27.3  26.0 - 34.0 pg Final  . MCHC 11/24/2014 33.5  32.0 - 36.0 g/dL Final  . RDW 11/24/2014 17.7* 11.5 - 14.5 % Final  . Platelets 11/24/2014 113* 150 - 440 K/uL Final  . Neutrophils Relative % 11/24/2014 59   Final  . Neutro Abs 11/24/2014 3.4  1.4 - 6.5 K/uL Final  . Lymphocytes Relative 11/24/2014 23   Final  . Lymphs Abs 11/24/2014 1.3  1.0 - 3.6 K/uL Final  . Monocytes Relative 11/24/2014 11   Final  . Monocytes Absolute 11/24/2014 0.7  0.2 - 1.0 K/uL Final  . Eosinophils Relative 11/24/2014 6   Final  . Eosinophils Absolute 11/24/2014 0.4  0 - 0.7 K/uL Final  . Basophils Relative 11/24/2014 1   Final  . Basophils Absolute 11/24/2014 0.0  0 - 0.1 K/uL Final     Assessment:  EGIDIO LOFGREN is a 74 y.o. male with iron deficiency anemia and multiple myeloma.  He has had mild anemia since 10/2013 and mild thrombocytopenia since 05/2014.  Colonoscopy in 10/2013 revealed polyps. EGD revealed gastritis.  He is scheduled for a capsule study on 11/27/2014.  His diet is good.  Labs confirmed iron deficiency (ferritin 11.8 on 05/2014 and 13 on 10/09/2014).  He is on oral iron twice a day.  B12 is low normal (MMA normal) thus ruling out B12 deficiency.  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 Serum immunoglobulins noted an IgG of 2930 (high).  24-hour urine revealed a 8.8% monoclonal protein (10.6 mg/24 hours). Bone survey on 10/20/2014 revealed no lytic lesions.  Albumen was 3.3 on 08/26/2014.  He had pneumonia in 05/2014.   Bone marrow biopsy on 11/10/2014 revealed a 10% monoclonal plasma cell infiltrate.  Marrow was hypercellular for age (40-50%) with mixed maturing hematopoiesis, relative erythroid hyperplasia and mild nonspecific dyserythropoiesis. There was patchy mild focally moderate increase in reticulin. There were no significant iron stores. Myeloma FISH panel revealed translocation (11;14) which results in fusion of CCND1 (BCL1) at 11q13 with the immunoglobulin heavy chain gene (IgH) at 14q32 (a favorable prognostic feature).  Symptomatically, he feels better. He has had intermittent diarrhea and abdominal discomfort. Exam is unremarkable.  Plan: 1. Discuss bone marrow aspirate and biopsy results.  Discuss borderline myeloma with 10% involvement, low albumen (3.3), and no current end stage organ disease.  Anemia confounded by concurrent iron deficiency.  Etiology of thrombocytopenia unclear unless sampling error from marrow site.  Discuss additional studies (beta2-microglobulin, LDH) to clearly define stage.  Discuss PET scan to look for bone lesions (would  upstage patient from smoldering myeloma to active myeloma).  Preliminary discussions regarding close follow-up (every 3 months) for smoldering myeloma. 2. Discuss consideration of IV iron.  Discuss risks and benefits.  Information provided.  Preauth requested. 3. Restart ferrous sulfate BID after capsule enteroscopy. 4. Labs today:  CBC with diff, CMP, LDH, beta2-microglobulin. 5. Schedule PET scan. 6. RTC after PET scan for MD assessment, assignment of stage, and outline of plan.   Lequita Asal, MD  11/24/2014, 11:38 AM

## 2014-11-24 NOTE — Progress Notes (Signed)
Patient here for follow up and results from CT scan

## 2014-11-25 LAB — BETA 2 MICROGLOBULIN, SERUM: Beta-2 Microglobulin: 4.2 mg/L — ABNORMAL HIGH (ref 0.6–2.4)

## 2014-11-27 ENCOUNTER — Ambulatory Visit (INDEPENDENT_AMBULATORY_CARE_PROVIDER_SITE_OTHER): Payer: PPO | Admitting: Internal Medicine

## 2014-11-27 DIAGNOSIS — D509 Iron deficiency anemia, unspecified: Secondary | ICD-10-CM

## 2014-11-27 DIAGNOSIS — R195 Other fecal abnormalities: Secondary | ICD-10-CM

## 2014-11-27 NOTE — Progress Notes (Signed)
Patient here for capsule endoscopy.  He completed the prep and has been NPO. Capsule [id number] expires 2017-11Tolerated procedure. Verbalizes understanding of written and verbal instructions.

## 2014-11-30 ENCOUNTER — Ambulatory Visit: Payer: PPO

## 2014-12-02 ENCOUNTER — Other Ambulatory Visit: Payer: Self-pay | Admitting: Hematology and Oncology

## 2014-12-02 DIAGNOSIS — C9000 Multiple myeloma not having achieved remission: Secondary | ICD-10-CM

## 2014-12-03 ENCOUNTER — Ambulatory Visit: Payer: PPO | Admitting: Hematology and Oncology

## 2014-12-04 ENCOUNTER — Telehealth: Payer: Self-pay | Admitting: *Deleted

## 2014-12-04 DIAGNOSIS — D509 Iron deficiency anemia, unspecified: Secondary | ICD-10-CM

## 2014-12-04 NOTE — Telephone Encounter (Signed)
-----   Message from Hulan Saas, RN sent at 11/27/2014 10:57 AM EDT ----- Call and see if he passed capsule.

## 2014-12-04 NOTE — Telephone Encounter (Signed)
Patient has not seen capsule pass. He will come for xray within next 2 weeks.

## 2014-12-04 NOTE — Telephone Encounter (Signed)
Ok ... Capsule was complete so not worried

## 2014-12-04 NOTE — Telephone Encounter (Signed)
Left a message for patient to call back. 

## 2014-12-07 ENCOUNTER — Ambulatory Visit
Admission: RE | Admit: 2014-12-07 | Discharge: 2014-12-07 | Disposition: A | Discharge status: Home / Self Care | Payer: PPO | Source: Ambulatory Visit | Attending: Hematology and Oncology | Admitting: Hematology and Oncology

## 2014-12-07 DIAGNOSIS — I2510 Atherosclerotic heart disease of native coronary artery without angina pectoris: Secondary | ICD-10-CM | POA: Diagnosis not present

## 2014-12-07 DIAGNOSIS — K766 Portal hypertension: Secondary | ICD-10-CM | POA: Diagnosis not present

## 2014-12-07 DIAGNOSIS — C9000 Multiple myeloma not having achieved remission: Secondary | ICD-10-CM | POA: Insufficient documentation

## 2014-12-07 DIAGNOSIS — K7460 Unspecified cirrhosis of liver: Secondary | ICD-10-CM | POA: Diagnosis not present

## 2014-12-07 DIAGNOSIS — R942 Abnormal results of pulmonary function studies: Secondary | ICD-10-CM | POA: Insufficient documentation

## 2014-12-07 LAB — GLUCOSE, CAPILLARY: Glucose-Capillary: 193 mg/dL — ABNORMAL HIGH (ref 65–99)

## 2014-12-07 MED ORDER — FLUDEOXYGLUCOSE F - 18 (FDG) INJECTION
12.9400 | Freq: Once | INTRAVENOUS | Status: DC | PRN
  Administered 2014-12-07: 12.94 via INTRAVENOUS
  Filled 2014-12-07: qty 12.94

## 2014-12-09 ENCOUNTER — Encounter: Payer: Self-pay | Admitting: Internal Medicine

## 2014-12-09 ENCOUNTER — Ambulatory Visit (INDEPENDENT_AMBULATORY_CARE_PROVIDER_SITE_OTHER)
Admission: RE | Admit: 2014-12-09 | Discharge: 2014-12-09 | Disposition: A | Discharge status: Home / Self Care | Payer: PPO | Source: Ambulatory Visit | Attending: Physician Assistant | Admitting: Physician Assistant

## 2014-12-09 ENCOUNTER — Ambulatory Visit (INDEPENDENT_AMBULATORY_CARE_PROVIDER_SITE_OTHER): Payer: PPO | Admitting: Internal Medicine

## 2014-12-09 ENCOUNTER — Telehealth: Payer: Self-pay | Admitting: Internal Medicine

## 2014-12-09 VITALS — BP 140/72 | HR 64 | Resp 12 | Ht 69.0 in | Wt 190.0 lb

## 2014-12-09 DIAGNOSIS — R9389 Abnormal findings on diagnostic imaging of other specified body structures: Secondary | ICD-10-CM

## 2014-12-09 DIAGNOSIS — E119 Type 2 diabetes mellitus without complications: Secondary | ICD-10-CM

## 2014-12-09 DIAGNOSIS — C9000 Multiple myeloma not having achieved remission: Secondary | ICD-10-CM

## 2014-12-09 DIAGNOSIS — Z23 Encounter for immunization: Secondary | ICD-10-CM

## 2014-12-09 DIAGNOSIS — E7800 Pure hypercholesterolemia, unspecified: Secondary | ICD-10-CM | POA: Diagnosis not present

## 2014-12-09 DIAGNOSIS — I2510 Atherosclerotic heart disease of native coronary artery without angina pectoris: Secondary | ICD-10-CM

## 2014-12-09 DIAGNOSIS — D649 Anemia, unspecified: Secondary | ICD-10-CM

## 2014-12-09 DIAGNOSIS — R1084 Generalized abdominal pain: Secondary | ICD-10-CM

## 2014-12-09 DIAGNOSIS — I10 Essential (primary) hypertension: Secondary | ICD-10-CM | POA: Diagnosis not present

## 2014-12-09 DIAGNOSIS — D509 Iron deficiency anemia, unspecified: Secondary | ICD-10-CM

## 2014-12-09 DIAGNOSIS — D696 Thrombocytopenia, unspecified: Secondary | ICD-10-CM

## 2014-12-09 DIAGNOSIS — R938 Abnormal findings on diagnostic imaging of other specified body structures: Secondary | ICD-10-CM

## 2014-12-09 LAB — HEPATIC FUNCTION PANEL
ALK PHOS: 62 U/L (ref 39–117)
ALT: 27 U/L (ref 0–53)
AST: 27 U/L (ref 0–37)
Albumin: 3.5 g/dL (ref 3.5–5.2)
BILIRUBIN DIRECT: 0.2 mg/dL (ref 0.0–0.3)
BILIRUBIN TOTAL: 0.5 mg/dL (ref 0.2–1.2)
TOTAL PROTEIN: 8.1 g/dL (ref 6.0–8.3)

## 2014-12-09 LAB — CBC WITH DIFFERENTIAL/PLATELET
BASOS ABS: 0 10*3/uL (ref 0.0–0.1)
BASOS PCT: 0.7 % (ref 0.0–3.0)
EOS ABS: 0.3 10*3/uL (ref 0.0–0.7)
Eosinophils Relative: 5.9 % — ABNORMAL HIGH (ref 0.0–5.0)
HCT: 34.5 % — ABNORMAL LOW (ref 39.0–52.0)
Hemoglobin: 11.6 g/dL — ABNORMAL LOW (ref 13.0–17.0)
Lymphocytes Relative: 20 % (ref 12.0–46.0)
Lymphs Abs: 1.2 10*3/uL (ref 0.7–4.0)
MCHC: 33.8 g/dL (ref 30.0–36.0)
MCV: 83.1 fl (ref 78.0–100.0)
MONO ABS: 0.6 10*3/uL (ref 0.1–1.0)
Monocytes Relative: 10.7 % (ref 3.0–12.0)
NEUTROS ABS: 3.6 10*3/uL (ref 1.4–7.7)
NEUTROS PCT: 62.7 % (ref 43.0–77.0)
PLATELETS: 116 10*3/uL — AB (ref 150.0–400.0)
RBC: 4.15 Mil/uL — ABNORMAL LOW (ref 4.22–5.81)
RDW: 16.7 % — AB (ref 11.5–15.5)
WBC: 5.8 10*3/uL (ref 4.0–10.5)

## 2014-12-09 LAB — LIPID PANEL
Cholesterol: 190 mg/dL (ref 0–200)
HDL: 22.8 mg/dL — ABNORMAL LOW (ref >39.00)
Total CHOL/HDL Ratio: 8
Triglycerides: 541 mg/dL — ABNORMAL HIGH (ref 0.0–149.0)

## 2014-12-09 LAB — BASIC METABOLIC PANEL
BUN: 22 mg/dL (ref 6–23)
CHLORIDE: 105 meq/L (ref 96–112)
CO2: 25 mEq/L (ref 19–32)
Calcium: 9.9 mg/dL (ref 8.4–10.5)
Creatinine, Ser: 1.13 mg/dL (ref 0.40–1.50)
GFR: 67.27 mL/min (ref >60.00)
Glucose, Bld: 228 mg/dL — ABNORMAL HIGH (ref 70–99)
POTASSIUM: 4.6 meq/L (ref 3.5–5.1)
SODIUM: 136 meq/L (ref 135–145)

## 2014-12-09 LAB — LDL CHOLESTEROL, DIRECT: LDL DIRECT: 63 mg/dL

## 2014-12-09 LAB — FERRITIN: FERRITIN: 14.9 ng/mL — AB (ref 22.0–322.0)

## 2014-12-09 LAB — HEMOGLOBIN A1C: HEMOGLOBIN A1C: 7.6 % — AB (ref 4.6–6.5)

## 2014-12-09 NOTE — Progress Notes (Signed)
Patient ID: Zachary Buck, male   DOB: Jul 02, 1940, 74 y.o.   MRN: [id number]   Subjective:    Patient ID: Zachary Buck, male    DOB: 1940-08-26, 74 y.o.   MRN: [id number]  HPI  Patient with past history of diabetes, hypertension, hypercholesterolemia, CAD, anemia and recent diagnosis of multiple myeloma.  See hematology/oncology.  Also seeing GI to complete w/up for the anemia.  Back on iron.  Just recently had capsule endoscopy.  Here today to f/u on these issues as well as for a complete physical exam.  States he is eating, but has some decreased appetite.  Some intermittent nausea and abdominal discomfort.  Some weight loss.  No chest pain.  Some decreased energy.  No significant change in his breathing.  No bowel change.  Brought in no recorded sugar readings.  States are some better.  Is not strict about his diet.  Just saw cardiology in 08/2014.  Had negative stress test per report.  No changes made.     Past Medical History  Diagnosis Date  . Hypercholesterolemia   . Atherosclerotic heart disease     s/p MI, s/p stent mid circumflex  . Hypertension   . Diabetes mellitus without complication ([place])   . Clavicle fracture   . Leg fracture   . Myocardial infarct ([place])   . CAD (coronary artery disease)   . Prostate enlargement   . Internal hemorrhoids   . Anemia    Past Surgical History  Procedure Laterality Date  . Tonsillectomy    . Coronary angioplasty with stent placement     Family History  Problem Relation Age of Onset  . Aneurysm Father 40    of the heart  . Diabetes Mother   . Heart attack Father    Social History   Social History  . Marital Status: Married    Spouse Name: N/A  . Number of Children: 2  . Years of Education: N/A   Occupational History  . Preacher    Social History Main Topics  . Smoking status: Never Smoker   . Smokeless tobacco: Never Used  . Alcohol Use: No  . Drug Use: None  . Sexual Activity: Not Asked   Other  Topics Concern  . None   Social History Narrative    Outpatient Encounter Prescriptions as of 12/09/2014  Medication Sig  . aspirin 81 MG tablet Take 81 mg by mouth daily.  . blood glucose meter kit and supplies KIT Dispense based on patient and insurance preference. Use up to four times daily as directed. (FOR ICD-9 250.00, 250.01).  . Cholecalciferol (VITAMIN D-3) 1000 UNITS CAPS Take by mouth.  . Coenzyme Q10 (COQ-10) 100 MG CAPS Take by mouth daily.  Burnadette Peter OIL by Does not apply route.  Marland Kitchen glipiZIDE (GLUCOTROL) 5 MG tablet Use 5 mg in the morning and 10 mg in the evening.  Marland Kitchen glucose blood (ONE TOUCH ULTRA TEST) test strip Use as instructed  . hyoscyamine (LEVSIN) 0.125 MG tablet Take 1-2 tablets by mouth every 4-6 hours as needed for crampy abdominal pain  . iron polysaccharides (FERREX 150) 150 MG capsule Take 1 capsule (150 mg total) by mouth daily.  . Lancets (ONETOUCH ULTRASOFT) lancets Use as instructed  . levocarnitine (CARNITOR) 250 MG capsule Take 250 mg by mouth daily.  Marland Kitchen lisinopril (PRINIVIL,ZESTRIL) 5 MG tablet Take 5 mg by mouth daily.  . metFORMIN (GLUCOPHAGE) 1000 MG tablet Take 1 tablet (1,000 mg total) by mouth 2 (  two) times daily with a meal.  . mupirocin ointment (BACTROBAN) 2 % Place 1 application into the nose 2 (two) times daily.  . Omega-3 Fatty Acids (FISH OIL) 1000 MG CAPS Take 3 capsules by mouth daily.  . pantoprazole (PROTONIX) 40 MG tablet Take 1 tablet (40 mg total) by mouth daily.  Marland Kitchen triamcinolone cream (KENALOG) 0.1 % Apply 1 application topically 2 (two) times daily.  . Turmeric Curcumin 500 MG CAPS Take 3 capsules by mouth 3 (three) times daily.  . [DISCONTINUED] vitamin E (VITAMIN E) 400 UNIT capsule Take 400 Units by mouth daily.  . [DISCONTINUED] vitamin k 100 MCG tablet Take 100 mcg by mouth daily.  . [DISCONTINUED] fludeoxyglucose F - 18 (FDG) injection 12.94 milli Curie    No facility-administered encounter medications on file as of  12/09/2014.    Review of Systems  Constitutional: Positive for appetite change.       Some weight loss.    HENT: Negative for congestion and sinus pressure.   Eyes: Negative for discharge and visual disturbance.  Respiratory: Negative for cough, chest tightness and shortness of breath.   Cardiovascular: Negative for chest pain, palpitations and leg swelling.  Gastrointestinal: Positive for nausea (intermittent nausea) and abdominal pain. Negative for vomiting.  Genitourinary: Negative for dysuria and difficulty urinating.  Musculoskeletal: Negative for back pain and joint swelling.  Skin: Negative for color change and rash.  Neurological: Negative for dizziness, light-headedness and headaches.  Hematological: Negative for adenopathy. Does not bruise/bleed easily.  Psychiatric/Behavioral: Negative for dysphoric mood and agitation.       Objective:    Physical Exam  Constitutional: He is oriented to person, place, and time. No distress.  HENT:  Head: Normocephalic and atraumatic.  Nose: Nose normal.  Mouth/Throat: Oropharynx is clear and moist. No oropharyngeal exudate.  Eyes: Conjunctivae are normal. Right eye exhibits no discharge. Left eye exhibits no discharge.  Neck: Neck supple. No thyromegaly present.  Cardiovascular: Normal rate and regular rhythm.   Pulmonary/Chest: Breath sounds normal. No respiratory distress. He has no wheezes.  Abdominal: Soft. Bowel sounds are normal. There is no tenderness.  Musculoskeletal: He exhibits no edema or tenderness.  Lymphadenopathy:    He has no cervical adenopathy.  Neurological: He is alert and oriented to person, place, and time.  Skin: Skin is warm and dry. No rash noted. No erythema.  Psychiatric: He has a normal mood and affect. His behavior is normal.    BP 140/72 mmHg  Pulse 64  Resp 12  Ht _0  (1.753 m)  Wt 190 lb (86.183 kg)  BMI 28.05 kg/m2 Wt Readings from Last 3 Encounters:  12/10/14 189 lb 13.1 oz (86.1 kg)    12/09/14 190 lb (86.183 kg)  12/07/14 195 lb (88.451 kg)     Lab Results  Component Value Date   WBC 5.8 12/09/2014   HGB 11.6* 12/09/2014   HCT 34.5* 12/09/2014   PLT 116.0* 12/09/2014   GLUCOSE 228* 12/09/2014   CHOL 190 12/09/2014   TRIG * 12/09/2014    541.0 Triglyceride is over 400; calculations on Lipids are invalid.   HDL 22.80* 12/09/2014   LDLDIRECT 63.0 12/09/2014   LDLCALC 51 05/14/2013   ALT 27 12/09/2014   AST 27 12/09/2014   NA 136 12/09/2014   K 4.6 12/09/2014   CL 105 12/09/2014   CREATININE 1.13 12/09/2014   BUN 22 12/09/2014   CO2 25 12/09/2014   TSH 3.627 10/09/2014   INR 0.99 11/10/2014  HGBA1C 7.6* 12/09/2014   MICROALBUR 4.7* 10/15/2013    Nm Pet Image Initial (pi) Skull Base To Thigh  12/07/2014  CLINICAL DATA:  Subsequent Treatment strategy for multiple myeloma. EXAM: NUCLEAR MEDICINE PET SKULL BASE TO THIGH TECHNIQUE: 12.9 mCi F-18 FDG was injected intravenously. Full-ring PET imaging was performed from the skull base to thigh after the radiotracer. CT data was obtained and used for attenuation correction and anatomic localization. FASTING BLOOD GLUCOSE:  Value: 193 mg/dl COMPARISON:  Skeletal survey of 10/20/2014. Abdominal pelvic CT of 10/17/2013. Chest CT of 08/20/2013. FINDINGS: NECK No areas of abnormal hypermetabolism. CHEST Dependent right lower lobe interstitial thickening and increased density. Has a morphology and location which favor scarring. Example on image/series 99/3. This is similar in distribution to on the 10/14/2013 chest CT. This does correspond to patchy hypermetabolism, including measuring a S.U.V. max of 3.4. ABDOMEN/PELVIS Mild hypermetabolism involving the proximal stomach. Underdistention in this area. This measures a S.U.V. max of 5.9, including on image/series 126/3. SKELETON No abnormal marrow activity. CT IMAGES PERFORMED FOR ATTENUATION CORRECTION Bilateral carotid atherosclerosis.  No cervical adenopathy. Mild  cardiomegaly. Multivessel coronary artery atherosclerosis. Pulmonary artery enlargement, outflow tract 3.3 cm. A small hiatal hernia. Bilateral pleural thickening and calcifications. Scattered calcified granulomas Periesophageal varices, with soft tissue fullness identified on image/series 113/3. Mild cirrhosis and splenomegaly. Abdominal aortic and branch vessel atherosclerosis. Mild prostatomegaly. Degenerative partial fusion of the bilateral sacroiliac joints. No suspicious focal osseous lesion. IMPRESSION: 1. No hypermetabolic foci within the marrow space or nodal stations to suggest active multiple myeloma. 2. Hypermetabolism within the right lower lobe, corresponding to similar dependent density. Morphology favors scarring. Given hypermetabolism, consider follow-up with chest CT stability at 3-6 months to confer. 3. Cirrhosis and portal venous hypertension, incompletely characterized. Consider dedicated pre and post contrast abdominal CT or MRI. 4. Gastric body hypermetabolism which is favored to be physiologic. 5.  Atherosclerosis, including within the coronary arteries. 6. Pulmonary artery enlargement suggests pulmonary arterial hypertension. 7. Asbestos related pleural disease. Electronically Signed   By: Abigail Miyamoto M.D.   On: 12/07/2014 10:37       Assessment & Plan:   Problem List Items Addressed This Visit    Abdominal pain    Still with intermittent flares.  Saw GI.  Has had EGD and colonoscopy.  Had capsule study.  Waiting for results.  Feels has improved.  Is eating.  Continue protonix.        Abnormal chest CT    Had abnormal chest CT previously 08/2013.  See report.  Is being followed by oncology now.  Had PET scan.        Anemia    Had EGD 11/18/13 - no repeat EGD.  Colonoscopy 11/18/13 - f/u colonoscopy 10/2016.        Relevant Orders   CBC with Differential/Platelet (Completed)   Ferritin   CAD (coronary artery disease)    Just evaluated by cardiology.  Had negative stress  test.  Currently feels symptoms are stable.        Diabetes ([place])    Discussed low carb diet and exercise.  He is not strict about his diet.  Follow met b and a1c.        Relevant Orders   Hemoglobin A1c (Completed)   Essential hypertension, benign - Primary    Blood pressure has been under good control.  Same medication regimen.  Follow pressures.        Relevant Orders   Basic metabolic panel  Hypercholesterolemia    Low cholesterol diet and exercise.  Follow lipid panel.        Relevant Orders   Hepatic function panel   Lipid panel   Multiple myeloma ([place])    Followed by oncology.  Has f/u tomorrow.  Is s/p PET scan.        Thrombocytopenia ([place])    Other Visit Diagnoses    Encounter for immunization            Einar Pheasant, MD

## 2014-12-09 NOTE — Telephone Encounter (Signed)
See report on your desk with recs

## 2014-12-09 NOTE — Telephone Encounter (Signed)
Pt calling for capsule endo results. Please advise. 

## 2014-12-10 ENCOUNTER — Encounter: Payer: Self-pay | Admitting: Internal Medicine

## 2014-12-10 ENCOUNTER — Inpatient Hospital Stay (HOSPITAL_BASED_OUTPATIENT_CLINIC_OR_DEPARTMENT_OTHER): Payer: PPO | Admitting: Hematology and Oncology

## 2014-12-10 VITALS — BP 130/69 | HR 80 | Temp 98.3°F | Wt 189.8 lb

## 2014-12-10 DIAGNOSIS — K7460 Unspecified cirrhosis of liver: Secondary | ICD-10-CM

## 2014-12-10 DIAGNOSIS — C9000 Multiple myeloma not having achieved remission: Secondary | ICD-10-CM

## 2014-12-10 DIAGNOSIS — D509 Iron deficiency anemia, unspecified: Secondary | ICD-10-CM | POA: Diagnosis not present

## 2014-12-10 DIAGNOSIS — K766 Portal hypertension: Secondary | ICD-10-CM

## 2014-12-10 DIAGNOSIS — D696 Thrombocytopenia, unspecified: Secondary | ICD-10-CM

## 2014-12-10 DIAGNOSIS — Z79899 Other long term (current) drug therapy: Secondary | ICD-10-CM

## 2014-12-10 NOTE — Telephone Encounter (Signed)
Results discussed with pt and he is aware. Pt scheduled to see Dr. Hilarie Fredrickson 01/04/15@1 :45pm. Pt aware of appt.

## 2014-12-11 NOTE — Telephone Encounter (Signed)
Unread mychart message mailed to patient 

## 2014-12-13 ENCOUNTER — Encounter: Payer: Self-pay | Admitting: Internal Medicine

## 2014-12-13 NOTE — Assessment & Plan Note (Signed)
Had EGD 11/18/13 - no repeat EGD.  Colonoscopy 11/18/13 - f/u colonoscopy 10/2016.

## 2014-12-13 NOTE — Assessment & Plan Note (Addendum)
Just evaluated by cardiology.  Had negative stress test.  Currently feels symptoms are stable.

## 2014-12-13 NOTE — Assessment & Plan Note (Signed)
Still with intermittent flares.  Saw GI.  Has had EGD and colonoscopy.  Had capsule study.  Waiting for results.  Feels has improved.  Is eating.  Continue protonix.

## 2014-12-13 NOTE — Assessment & Plan Note (Signed)
Had abnormal chest CT previously 08/2013.  See report.  Is being followed by oncology now.  Had PET scan.

## 2014-12-13 NOTE — Assessment & Plan Note (Signed)
Blood pressure has been under good control.  Same medication regimen.  Follow pressures.   

## 2014-12-13 NOTE — Assessment & Plan Note (Signed)
Discussed low carb diet and exercise.  He is not strict about his diet.  Follow met b and a1c.   

## 2014-12-13 NOTE — Assessment & Plan Note (Signed)
Low cholesterol diet and exercise.  Follow lipid panel.   

## 2014-12-13 NOTE — Assessment & Plan Note (Signed)
Followed by oncology.  Has f/u tomorrow.  Is s/p PET scan.

## 2015-01-01 ENCOUNTER — Telehealth: Payer: Self-pay | Admitting: Internal Medicine

## 2015-01-01 NOTE — Telephone Encounter (Signed)
Left msg for pt to call office to schedule AWV.msn °

## 2015-01-04 ENCOUNTER — Encounter: Payer: Self-pay | Admitting: Internal Medicine

## 2015-01-04 ENCOUNTER — Telehealth: Payer: Self-pay | Admitting: *Deleted

## 2015-01-04 ENCOUNTER — Other Ambulatory Visit: Payer: PPO

## 2015-01-04 ENCOUNTER — Ambulatory Visit (INDEPENDENT_AMBULATORY_CARE_PROVIDER_SITE_OTHER): Payer: PPO | Admitting: Internal Medicine

## 2015-01-04 VITALS — BP 130/70 | HR 72 | Ht 69.0 in | Wt 190.2 lb

## 2015-01-04 DIAGNOSIS — K7460 Unspecified cirrhosis of liver: Secondary | ICD-10-CM

## 2015-01-04 DIAGNOSIS — R195 Other fecal abnormalities: Secondary | ICD-10-CM

## 2015-01-04 DIAGNOSIS — R197 Diarrhea, unspecified: Secondary | ICD-10-CM

## 2015-01-04 DIAGNOSIS — K317 Polyp of stomach and duodenum: Secondary | ICD-10-CM | POA: Diagnosis not present

## 2015-01-04 DIAGNOSIS — Z860100 Personal history of colon polyps, unspecified: Secondary | ICD-10-CM

## 2015-01-04 DIAGNOSIS — D509 Iron deficiency anemia, unspecified: Secondary | ICD-10-CM

## 2015-01-04 DIAGNOSIS — R1084 Generalized abdominal pain: Secondary | ICD-10-CM

## 2015-01-04 MED ORDER — PANCRELIPASE (LIP-PROT-AMYL) 40000-136000 UNITS PO CPEP: ORAL_CAPSULE | ORAL | Status: DC

## 2015-01-04 MED ORDER — NA SULFATE-K SULFATE-MG SULF 17.5-3.13-1.6 GM/177ML PO SOLN: ORAL | Status: DC

## 2015-01-04 NOTE — Telephone Encounter (Signed)
Patient stated that he received a call from Jana Half, he questioned why should he have a medicare wellness visit id he had a physical 10/19. Patient requested a call back.

## 2015-01-04 NOTE — Patient Instructions (Signed)
You have been scheduled for an endoscopy and colonoscopy. Please follow the written instructions given to you at your visit today. Please pick up your prep supplies at the pharmacy within the next 1-3 days. If you use inhalers (even only as needed), please bring them with you on the day of your procedure. Your physician has requested that you go to www.[email or web address] and enter the access code given to you at your visit today. This web site gives a general overview about your procedure. However, you should still follow specific instructions given to you by our office regarding your preparation for the procedure.  Your physician has requested that you go to the basement for the lab work before leaving today  We have sent the following medications to your pharmacy for you to pick up at your convenience: Zenep 40000-Take 2 capsules with each meal, 1 capsule with each snack  Please hold fish oil and iron 1 week prior to procedure.

## 2015-01-04 NOTE — Telephone Encounter (Signed)
Thank you!  Patient has been made aware.

## 2015-01-04 NOTE — Progress Notes (Signed)
Subjective:    Patient ID: Zachary Buck, male    DOB: 09/11/1940, 74 y.o.   MRN: [id number]  HPI Zachary Buck is a 74 yo male with PMH of multiple myeloma, colon polyps, gastritis, iron deficiency anemia with heme positive stools who is seen for follow-up. He was initially seen on 11/16/2014 in consultation from Dr. Nicki Reaper to evaluate heme-positive stool abdominal pain diarrhea and IDA. Decision was made to start pantoprazole given history of gastritis and perform video capsule endoscopy. Video capsule endoscopy was performed that he never started pantoprazole because he read the package insert and was worried about all the side effects namely nausea, diarrhea and possible death. The capsule endoscopy was performed on 11/27/2014. This revealed a complete exam with good preparation. There were gastric polyps noted along with inflammation in the stomach. The small bowel is normal without evidence for inflammation or source of blood loss. A colon polyp was incidentally seen by the capsule.  Today he reports that he continues to have intermittent issues with it cramping abdominal pain followed by urgent and explosive diarrhea. He's not sure this relates to eating. Occasionally the stool does look dark and even black. He gets nauseous with the cramping abdominal pain but no vomiting. Appetite he reports his been good. The cramping abdominal pain and diarrhea is daily in occurrence but remains unpredictable. He denies heartburn. No dysphagia or odynophagia. He has continued to take oral iron and follows with Dr. Mike Gip with hematology in [place]. He follows with Dr. Mike Gip for multiple myeloma and recently a PET CT scan was performed. This raised the question and probability of cirrhosis and he wants to discuss this further. As we previously discussed he has lost weight in the past year about 40 pounds unintentional.  In regard to cirrhosis he reports he has never been an alcohol drinker.  No family history of liver disease though to family members did have issues with alcoholism. He denies any history of jaundice. Denies increasing abdominal girth or lower extremity edema. No issues with confusion.  PET scan was reviewed and revealed -- no hypermetabolic foci in bone marrow or nodal stations. There was hypermetabolism in the right lower lobe of the lung. There was cirrhosis with portal venous hypertension. There was gastric body hypermetabolism favored to be physiologic. Atherosclerosis including in the coronary arteries. Pulmonary arterial enlargement suggesting pulmonary arterial hypertension. 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 Splenomegaly.  Review of Systems As per history of present illness, otherwise negative  Current Medications, Allergies, Past Medical History, Past Surgical History, Family History and Social History were reviewed in [employer] record.     Objective:   Physical Exam BP 130/70 mmHg  Pulse 72  Ht 5' 9"  (1.753 m)  Wt 190 lb 4 oz (86.297 kg)  BMI 28.08 kg/m2 Constitutional: Well-developed and well-nourished. No distress. HEENT: Normocephalic and atraumatic. Oropharynx is clear and moist. No oropharyngeal exudate. Conjunctivae are normal.  No scleral icterus. Neck: Neck supple. Trachea midline. Cardiovascular: Normal rate, regular rhythm and intact distal pulses. No M/R/G Pulmonary/chest: Effort normal and breath sounds normal. No wheezing, rales or rhonchi. Abdominal: Soft, nontender, nondistended. Bowel sounds active throughout. There are no masses palpable. No hepatosplenomegaly. Extremities: no clubbing, cyanosis, or edema Lymphadenopathy: No cervical adenopathy noted. Neurological: Alert and oriented to person place and time. Skin: Skin is warm and dry. No rashes noted. Psychiatric: Normal mood and affect. Behavior is normal.  CMP  Component Value  Date/Time   NA 136 12/09/2014 0909   K 4.6 12/09/2014 0909   CL 105 12/09/2014 0909   CO2 25 12/09/2014 0909   GLUCOSE 228* 12/09/2014 0909   BUN 22 12/09/2014 0909   CREATININE 1.13 12/09/2014 0909   CREATININE 1.43* 08/30/2012 1615   CALCIUM 9.9 12/09/2014 0909   PROT 8.1 12/09/2014 0909   PROT 8.3* 11/18/2013 1053   ALBUMIN 3.5 12/09/2014 0909   ALBUMIN 3.3* 11/18/2013 1053   AST 27 12/09/2014 0909   AST 29 11/18/2013 1053   ALT 27 12/09/2014 0909   ALT 36 11/18/2013 1053   ALKPHOS 62 12/09/2014 0909   ALKPHOS 58 11/18/2013 1053   BILITOT 0.5 12/09/2014 0909   BILITOT 0.6 11/18/2013 1053   GFRNONAA >60 11/24/2014 1114   GFRNONAA 49* 08/30/2012 1615   GFRAA >60 11/24/2014 1114   GFRAA 56* 08/30/2012 1615    Lab Results  Component Value Date   INR 0.99 11/10/2014   INR 1.08 11/02/2014   INR 1.0 11/18/2013   CBC    Component Value Date/Time   WBC 5.8 12/09/2014 0909   WBC 6.0 11/18/2013 1053   RBC 4.15* 12/09/2014 0909   RBC 4.24* 10/09/2014 1056   RBC 4.80 11/18/2013 1053   HGB 11.6* 12/09/2014 0909   HGB 13.1 11/18/2013 1053   HCT 34.5* 12/09/2014 0909   HCT 40.4 11/18/2013 1053   PLT 116.0* 12/09/2014 0909   PLT 152 11/18/2013 1053   MCV 83.1 12/09/2014 0909   MCV 84 11/18/2013 1053   MCH 27.3 11/24/2014 1114   MCH 27.3 11/18/2013 1053   MCHC 33.8 12/09/2014 0909   MCHC 32.5 11/18/2013 1053   RDW 16.7* 12/09/2014 0909   RDW 14.9* 11/18/2013 1053   LYMPHSABS 1.2 12/09/2014 0909   MONOABS 0.6 12/09/2014 0909   EOSABS 0.3 12/09/2014 0909   BASOSABS 0.0 12/09/2014 0909    Upper endoscopy was performed on 11/18/2013 -- this showed a normal esophagus, congestion and gastritis in the gastric body and minimal inflammation and scattered erosions in the antrum. This was biopsied. Normal duodenum. 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 H. pylori negative.  Colonoscopy performed 11/18/2013 -- 4 mm polyp at splenic  flexure, 3 mm polyp in descending colon, 2 mm polyp in descending colon, 3 mm polyp in mid sigmoid colon. Nonbleeding internal hemorrhoids. Random biopsies. Pathology = random colon biopsies negative. Tubular adenoma in all removed colon polyps without high-grade dysplasia or malignancy.  CT scan abdomen and pelvis 10/17/2013 -- mild prominence of the distal periesophageal and perigastric vasculature. No CT evidence of cirrhosis. No splenomegaly. Portal vein is patent. Nodularity along the right pleural base consistent with prior inflammatory change. Prostate enlargement. CAD. Cardiomegaly.  Small bowel follow-through 12/09/2013 -- moderate stool burden throughout the colon. No obstruction. Normal study.     Assessment & Plan:   74 yo male with PMH of multiple myeloma, colon polyps, gastritis, iron deficiency anemia with heme positive stools who is seen for follow-up.   1. IDA/epigastric pain -- after review of his video capsule endoscopy in prior endoscopic procedures, the most likely source of heme positive stool would be gastritis and gastric polyp seen by video capsule. He did not start PPI and we have had a discussion about this today. Given the gastric polyp seen in prior inflammation in the stomach I recommended that he begin pantoprazole 40 mg daily. Should he develop worsening of diarrhea, nausea or any  other side effect I asked that he let me know. He voices understanding. I have also recommended upper endoscopy to evaluate these gastric polyps and to consider resection. We discussed gastric polyp removal which is higher risk for post-polypectomy bleeding than average polypectomy. I would like this to be performed in the outpatient hospital setting and would anticipate endoscopic clipping after resection as necessary. He will continue oral iron and discuss IV iron with Dr. Mike Gip.  2. Intermittent abdominal pain and loose explosive diarrhea -- previously evaluated by colonoscopy. I am  suspicious of pancreatic insufficiency and stearrhea after discussing this with him today. No evidence of chronic pancreatitis by imaging. I recommended stool for fecal elastase, fecal leukocytes and repeat C. difficile. Trial of Zenpep 2 tablets with meals, one with snacks. 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  3. Cirrhosis with portal hypertension -- diagnosis by CT scan. Etiology unclear but most likely nonalcoholic fatty liver disease. Exclude other possible sources for cirrhosis. Check viral hepatitis studies, ANA, IgG, smooth muscle antibody and mitochondrial antibodies. Iron overload already excluded as he is iron deficient. We discussed cirrhosis at length today and he does have evidence of portal hypertension, primarily splenomegaly and thrombocytopenia. Suggestion of esophageal varices by CT, will evaluate for esophageal varices at time of upper endoscopy as discussed in #1. He will need [place] screening every 6 months. He has had flu shot and previous Pneumovax. He does not drink alcohol. Liver enzymes have been normal recently.  4. Colon polyp -- history of colon polyps in the past and colon polyps seen by video capsule. For this reason I recommended repeating colonoscopy at the time of upper endoscopy. We discussed the risks, benefits and alternatives and he is agreeable to proceed  Follow-up in 3 months, sooner if necessary and for procedures as discussed above  45 minutes spent with the patient today. Greater than 50% was spent in counseling and coordination of care with the patient

## 2015-01-05 LAB — HEPATITIS B CORE ANTIBODY, TOTAL: HEP B C TOTAL AB: NONREACTIVE

## 2015-01-05 LAB — IGG: IGG (IMMUNOGLOBIN G), SERUM: 2600 mg/dL — AB (ref 650–1600)

## 2015-01-05 LAB — ANTI-SMOOTH MUSCLE ANTIBODY, IGG: Smooth Muscle Ab: 8 U (ref <20)

## 2015-01-05 LAB — HEPATITIS A ANTIBODY, TOTAL: Hep A Total Ab: REACTIVE — AB

## 2015-01-05 LAB — HEPATITIS B SURFACE ANTIBODY,QUALITATIVE: Hep B S Ab: NEGATIVE

## 2015-01-05 LAB — HEPATITIS B SURFACE ANTIGEN: HEP B S AG: NEGATIVE

## 2015-01-05 LAB — HEPATITIS C ANTIBODY: HCV AB: NEGATIVE

## 2015-01-05 LAB — ANA: ANA: NEGATIVE

## 2015-01-06 ENCOUNTER — Telehealth: Payer: Self-pay | Admitting: Internal Medicine

## 2015-01-06 ENCOUNTER — Other Ambulatory Visit: Payer: Self-pay | Admitting: *Deleted

## 2015-01-06 DIAGNOSIS — D509 Iron deficiency anemia, unspecified: Secondary | ICD-10-CM

## 2015-01-06 DIAGNOSIS — R195 Other fecal abnormalities: Secondary | ICD-10-CM

## 2015-01-06 DIAGNOSIS — K317 Polyp of stomach and duodenum: Secondary | ICD-10-CM

## 2015-01-06 DIAGNOSIS — Z86010 Personal history of colonic polyps: Secondary | ICD-10-CM

## 2015-01-06 DIAGNOSIS — K7460 Unspecified cirrhosis of liver: Secondary | ICD-10-CM

## 2015-01-06 LAB — MITOCHONDRIAL ANTIBODIES: Mitochondrial M2 Ab, IgG: 0.48 (ref <0.91)

## 2015-01-06 NOTE — Telephone Encounter (Signed)
Pt had questions regarding his stool specimen. He has collected it and states he is taking it to [facility] in [place].

## 2015-01-07 LAB — FECAL LACTOFERRIN, QUANT: Lactoferrin: POSITIVE

## 2015-01-07 LAB — CLOSTRIDIUM DIFFICILE BY PCR: Toxigenic C. Difficile by PCR: NOT DETECTED

## 2015-01-11 ENCOUNTER — Telehealth: Payer: Self-pay | Admitting: Internal Medicine

## 2015-01-11 NOTE — Telephone Encounter (Signed)
Requesting to speak to Dr. Hilarie Fredrickson directly.

## 2015-01-12 NOTE — Telephone Encounter (Signed)
Called and spoke to the patient's wife She states she called and he did not know that she had called She was concerned because over the weekend he had further episode of abdominal pain and diarrhea. Stools were nonbloody and non-melenic She has reviewed lab results done last week and we discussed them today He has been on PPI for one week as well as Creon with meals The last several days have been "very good". He's been feeling very well and she feels he is improving He is scheduled for upper endoscopy and colonoscopy to evaluate gastric and colon polyps seen by video capsule plus to further evaluate GI blood loss and evaluate abdominal pain. These are currently scheduled for early January 2017 I asked that she keep [place] updated, if he continues to have recurrent issues then we could try to perform procedures earlier She voiced understanding Time provided for questions and answers and she thanked me for the call

## 2015-01-18 ENCOUNTER — Inpatient Hospital Stay (HOSPITAL_BASED_OUTPATIENT_CLINIC_OR_DEPARTMENT_OTHER): Payer: PPO | Admitting: Hematology and Oncology

## 2015-01-18 ENCOUNTER — Inpatient Hospital Stay: Payer: PPO | Attending: Hematology and Oncology

## 2015-01-18 ENCOUNTER — Encounter: Payer: Self-pay | Admitting: Internal Medicine

## 2015-01-18 VITALS — BP 155/78 | HR 74 | Temp 96.1°F | Resp 18 | Ht 69.0 in | Wt 187.2 lb

## 2015-01-18 DIAGNOSIS — R109 Unspecified abdominal pain: Secondary | ICD-10-CM | POA: Diagnosis not present

## 2015-01-18 DIAGNOSIS — D696 Thrombocytopenia, unspecified: Secondary | ICD-10-CM | POA: Insufficient documentation

## 2015-01-18 DIAGNOSIS — I252 Old myocardial infarction: Secondary | ICD-10-CM | POA: Insufficient documentation

## 2015-01-18 DIAGNOSIS — I2510 Atherosclerotic heart disease of native coronary artery without angina pectoris: Secondary | ICD-10-CM | POA: Diagnosis not present

## 2015-01-18 DIAGNOSIS — I10 Essential (primary) hypertension: Secondary | ICD-10-CM | POA: Insufficient documentation

## 2015-01-18 DIAGNOSIS — E7800 Pure hypercholesterolemia, unspecified: Secondary | ICD-10-CM | POA: Insufficient documentation

## 2015-01-18 DIAGNOSIS — K7460 Unspecified cirrhosis of liver: Secondary | ICD-10-CM | POA: Diagnosis not present

## 2015-01-18 DIAGNOSIS — Z79899 Other long term (current) drug therapy: Secondary | ICD-10-CM

## 2015-01-18 DIAGNOSIS — C9000 Multiple myeloma not having achieved remission: Secondary | ICD-10-CM | POA: Insufficient documentation

## 2015-01-18 DIAGNOSIS — N400 Enlarged prostate without lower urinary tract symptoms: Secondary | ICD-10-CM | POA: Diagnosis not present

## 2015-01-18 DIAGNOSIS — K766 Portal hypertension: Secondary | ICD-10-CM | POA: Diagnosis not present

## 2015-01-18 DIAGNOSIS — E119 Type 2 diabetes mellitus without complications: Secondary | ICD-10-CM | POA: Diagnosis not present

## 2015-01-18 DIAGNOSIS — D509 Iron deficiency anemia, unspecified: Secondary | ICD-10-CM | POA: Insufficient documentation

## 2015-01-18 LAB — CBC WITH DIFFERENTIAL/PLATELET
Basophils Absolute: 0 10*3/uL (ref 0–0.1)
Basophils Relative: 1 %
Eosinophils Absolute: 0.3 10*3/uL (ref 0–0.7)
Eosinophils Relative: 5 %
HCT: 35.5 % — ABNORMAL LOW (ref 40.0–52.0)
Hemoglobin: 11.9 g/dL — ABNORMAL LOW (ref 13.0–18.0)
Lymphocytes Relative: 23 %
Lymphs Abs: 1.3 10*3/uL (ref 1.0–3.6)
MCH: 28.3 pg (ref 26.0–34.0)
MCHC: 33.6 g/dL (ref 32.0–36.0)
MCV: 84.2 fL (ref 80.0–100.0)
Monocytes Absolute: 0.7 10*3/uL (ref 0.2–1.0)
Monocytes Relative: 12 %
Neutro Abs: 3.5 10*3/uL (ref 1.4–6.5)
Neutrophils Relative %: 59 %
Platelets: 113 10*3/uL — ABNORMAL LOW (ref 150–440)
RBC: 4.21 MIL/uL — ABNORMAL LOW (ref 4.40–5.90)
RDW: 15.9 % — ABNORMAL HIGH (ref 11.5–14.5)
WBC: 5.9 10*3/uL (ref 3.8–10.6)

## 2015-01-18 LAB — COMPREHENSIVE METABOLIC PANEL
ALT: 40 U/L (ref 17–63)
AST: 17 U/L (ref 15–41)
Albumin: 3.6 g/dL (ref 3.5–5.0)
Alkaline Phosphatase: 76 U/L (ref 38–126)
Anion gap: 7 (ref 5–15)
BUN: 24 mg/dL — ABNORMAL HIGH (ref 6–20)
CO2: 25 mmol/L (ref 22–32)
Calcium: 9.7 mg/dL (ref 8.9–10.3)
Chloride: 101 mmol/L (ref 101–111)
Creatinine, Ser: 1.21 mg/dL (ref 0.61–1.24)
GFR calc Af Amer: >60 mL/min (ref >60)
GFR calc non Af Amer: 57 mL/min — ABNORMAL LOW (ref >60)
Glucose, Bld: 281 mg/dL — ABNORMAL HIGH (ref 65–99)
Potassium: 4.3 mmol/L (ref 3.5–5.1)
Sodium: 133 mmol/L — ABNORMAL LOW (ref 135–145)
Total Bilirubin: 1 mg/dL (ref 0.3–1.2)
Total Protein: 8.5 g/dL — ABNORMAL HIGH (ref 6.5–8.1)

## 2015-01-18 LAB — PANCREATIC ELASTASE, FECAL: Pancreatic Elastase-1, Stool: 393 mcg/g

## 2015-01-18 LAB — FERRITIN: Ferritin: 25 ng/mL (ref 24–336)

## 2015-01-18 NOTE — Progress Notes (Signed)
[facility] day: 01/18/2015   Chief Complaint: Zachary Buck is a 74 y.o. male  with stage II multiple myeloma and iron deficiency anemia who is seen for 1 month assessment.  HPI: The patient was last seen in the medical oncology clinic on 12/10/2014.  At that time, PET scan revealed no evidence of bone metastasis.  He was felt to have stage II myeloma.  We discussed observation.  He had iron deficiency and findings on PET scan consistent with cirrhosis and portal venous hypertension. There was gastric body hypermetabolism favoring physiologic.  Capsule enteroscopy on 10/7/.2016 revealed 1-2 gastric polyps and one colon polyp.  There were no findings to explain iron deficiency anemia.  He was scheduled to see Dr. Zenovia Jarred.  Symptomatically, he has abdominal pain and diarrhea. He describes cramping and urgent explosive diarrhea.  He has occasional nausea and black stools.  He denies any bright red blood or mucus. He has been on a proton pump inhibitor for one week.  He states that pancreatic insufficiency was likely the cause as Creon is helping improve his symptoms. He is scheduled for upper and lower endoscopy on 03/04/2015.  Serologies have been sent.  He denies any bone pain.  He has had no interval infections.  Past Medical History  Diagnosis Date  . Hypercholesterolemia   . Atherosclerotic heart disease     s/p MI, s/p stent mid circumflex  . Hypertension   . Diabetes mellitus without complication ([place])   . Clavicle fracture   . Leg fracture   . Myocardial infarct ([place])   . CAD (coronary artery disease)   . Prostate enlargement   . Internal hemorrhoids   . Anemia     Past Surgical History  Procedure Laterality Date  . Tonsillectomy    . Coronary angioplasty with stent placement      Family History  Problem Relation Age of Onset  . Aneurysm Father 11    of the heart  . Diabetes Mother   . Heart attack Father      Social History:  reports that he has never smoked. He has never used smokeless tobacco. He reports that he does not drink alcohol. His drug history is not on file.  Patient denies any exposure to radiation or toxins. The patient is accompanied by his wife, [name], today.  Allergies:  Allergies  Allergen Reactions  . Niaspan [Niacin Er]     Current Medications: Current Outpatient Prescriptions  Medication Sig Dispense Refill  . aspirin 81 MG tablet Take 81 mg by mouth daily.    . blood glucose meter kit and supplies KIT Dispense based on patient and insurance preference. Use up to four times daily as directed. (FOR ICD-9 250.00, 250.01). 1 each 0  . Cholecalciferol (VITAMIN D-3) 1000 UNITS CAPS Take by mouth.    . Coenzyme Q10 (COQ-10) 100 MG CAPS Take by mouth daily.    Burnadette Peter OIL by Does not apply route.    Marland Kitchen glipiZIDE (GLUCOTROL) 5 MG tablet Use 5 mg in the morning and 10 mg in the evening. 270 tablet 1  . glucose blood (ONE TOUCH ULTRA TEST) test strip Use as instructed 100 each 12  . iron polysaccharides (FERREX 150) 150 MG capsule Take 1 capsule (150 mg total) by mouth daily. 30 capsule 2  . Lancets (ONETOUCH ULTRASOFT) lancets Use as instructed 100 each 12  . levocarnitine (CARNITOR) 250 MG capsule Take 250 mg by mouth daily.    Marland Kitchen  lisinopril (PRINIVIL,ZESTRIL) 5 MG tablet Take 5 mg by mouth daily.    . metFORMIN (GLUCOPHAGE) 1000 MG tablet Take 1 tablet (1,000 mg total) by mouth 2 (two) times daily with a meal. 180 tablet 1  . mupirocin ointment (BACTROBAN) 2 % Place 1 application into the nose 2 (two) times daily. 22 g 0  . Na Sulfate-K Sulfate-Mg Sulf SOLN Suprep-Use as directed 354 mL 0  . Omega-3 Fatty Acids (FISH OIL) 1000 MG CAPS Take 3 capsules by mouth daily.    . Pancrelipase, Lip-Prot-Amyl, (ZENPEP) 40000 UNITS CPEP Take 2 capsules with each meal, 1 capsule with each snack 180 capsule 3  . pantoprazole (PROTONIX) 40 MG tablet Take 1 tablet (40 mg total) by  mouth daily. 3 tablet 3  . triamcinolone cream (KENALOG) 0.1 % Apply 1 application topically 2 (two) times daily. 30 g 0  . Turmeric Curcumin 500 MG CAPS Take 3 capsules by mouth 3 (three) times daily.     No current facility-administered medications for this visit.    Review of Systems:  GENERAL:  Feels "pretty good".  No fevers or sweats.  Weight down 3 pounds. PERFORMANCE STATUS (ECOG):  1 HEENT:  No visual changes, runny nose, sore throat, mouth sores or tenderness. Lungs: No shortness of breath or cough.  No hemoptysis. Cardiac:  No chest pain, palpitations, orthopnea, or PND. GI:  Abdominal pain and cramping.  Explosive diarrhea.  Occasional black stools and nausea.  No vomiting, constipation, or hematochezia. GU:  No urgency, frequency, dysuria, or hematuria. Musculoskeletal:  No back pain.  No joint pain.  No muscle tenderness. Extremities:  No pain or swelling. Skin:  No rashes or skin changes. Neuro:  No headache, numbness or weakness, balance or coordination issues. Endocrine:  Diabetes.  No tthyroid issues, hot flashes or night sweats. Psych:  No mood changes, depression or anxiety. Pain:  No focal pain. Review of systems:  All other systems reviewed and found to be negative.  Physical Exam: Blood pressure 155/78, pulse 74, temperature 96.1 F (35.6 C), temperature source Tympanic, resp. rate 18, height _0  (1.753 m), weight 187 lb 2.7 oz (84.9 kg). GENERAL:  Well developed, well nourished, sitting comfortably in the exam room in no acute distress. MENTAL STATUS:  Alert and oriented to person, place and time. HEAD:  Pearline Cables hair.  Normocephalic, atraumatic, face symmetric, no Cushingoid features. EYES:  Blue eyes.  Pupils equal round and reactive to light and accomodation.  No conjunctivitis or scleral icterus. ENT:  Oropharynx clear without lesion.  Tongue normal. Mucous membranes moist.  RESPIRATORY:  Clear to auscultation without rales, wheezes or  rhonchi. CARDIOVASCULAR:  Regular rate and rhythm without murmur, rub or gallop. ABDOMEN:  Soft, non-tender, with active bowel sounds, and no appreciable hepatosplenomegaly.  No masses. SKIN:  No rashes, ulcers or lesions. EXTREMITIES: No edema, no skin discoloration or tenderness.  No palpable cords. LYMPH NODES: No palpable cervical, supraclavicular, axillary or inguinal adenopathy  NEUROLOGICAL: Unremarkable. PSYCH:  Appropriate.  Appointment on 01/18/2015  Component Date Value Ref Range Status  . WBC 01/18/2015 5.9  3.8 - 10.6 K/uL Final  . RBC 01/18/2015 4.21* 4.40 - 5.90 MIL/uL Final  . Hemoglobin 01/18/2015 11.9* 13.0 - 18.0 g/dL Final  . HCT 01/18/2015 35.5* 40.0 - 52.0 % Final  . MCV 01/18/2015 84.2  80.0 - 100.0 fL Final  . MCH 01/18/2015 28.3  26.0 - 34.0 pg Final  . MCHC 01/18/2015 33.6  32.0 - 36.0 g/dL Final  .  RDW 01/18/2015 15.9* 11.5 - 14.5 % Final  . Platelets 01/18/2015 113* 150 - 440 K/uL Final  . Neutrophils Relative % 01/18/2015 59   Final  . Neutro Abs 01/18/2015 3.5  1.4 - 6.5 K/uL Final  . Lymphocytes Relative 01/18/2015 23   Final  . Lymphs Abs 01/18/2015 1.3  1.0 - 3.6 K/uL Final  . Monocytes Relative 01/18/2015 12   Final  . Monocytes Absolute 01/18/2015 0.7  0.2 - 1.0 K/uL Final  . Eosinophils Relative 01/18/2015 5   Final  . Eosinophils Absolute 01/18/2015 0.3  0 - 0.7 K/uL Final  . Basophils Relative 01/18/2015 1   Final  . Basophils Absolute 01/18/2015 0.0  0 - 0.1 K/uL Final  . Sodium 01/18/2015 133* 135 - 145 mmol/L Final  . Potassium 01/18/2015 4.3  3.5 - 5.1 mmol/L Final  . Chloride 01/18/2015 101  101 - 111 mmol/L Final  . CO2 01/18/2015 25  22 - 32 mmol/L Final  . Glucose, Bld 01/18/2015 281* 65 - 99 mg/dL Final  . BUN 01/18/2015 24* 6 - 20 mg/dL Final  . Creatinine, Ser 01/18/2015 1.21  0.61 - 1.24 mg/dL Final  . Calcium 01/18/2015 9.7  8.9 - 10.3 mg/dL Final  . Total Protein 01/18/2015 8.5* 6.5 - 8.1 g/dL Final  . Albumin 01/18/2015 3.6   3.5 - 5.0 g/dL Final  . AST 01/18/2015 17  15 - 41 U/L Final  . ALT 01/18/2015 40  17 - 63 U/L Final  . Alkaline Phosphatase 01/18/2015 76  38 - 126 U/L Final  . Total Bilirubin 01/18/2015 1.0  0.3 - 1.2 mg/dL Final  . GFR calc non Af Amer 01/18/2015 57* >60 mL/min Final  . GFR calc Af Amer 01/18/2015 >60  >60 mL/min Final   Comment: (NOTE) The eGFR has been calculated using the CKD EPI equation. This calculation has not been validated in all clinical situations. eGFR's persistently <60 mL/min signify possible Chronic Kidney Disease.   Georgiann Hahn gap 01/18/2015 7  5 - 15 Final    Assessment:  NOCHUM FENTER is a 74 y.o. male with iron deficiency anemia and stage II multiple myeloma.   He has had mild anemia since 10/2013 and mild thrombocytopenia since 05/2014. Colonoscopy in 10/2013 revealed polyps. EGD revealed gastritis. Capsule enteroscopy on 10/7/.2016 revealed 1-2 gastric polyps and one colon polyp.  There were no findings to explain iron deficiency anemia.  His diet is good. Labs confirmed iron deficiency (ferritin 11.8 on 05/2014 and 13 on 10/09/2014). He has been on oral iron. B12 is low normal (MMA normal) thus ruling out B12 deficiency.  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 Serum immunoglobulins noted an IgG of 2930 (high). 24-hour urine revealed a 8.8% monoclonal protein (10.6 mg/24 hours). Bone survey on 10/20/2014 revealed no lytic lesions. Albumen was 3.3 on 08/26/2014. He had pneumonia in 05/2014.   SPEP was 2.3 g/dL on 10/09/2014 and 01/18/2015.  Kappa free light chains were 23.99, lambda free light chains 155.3, and a ratio of 0.15 (low) on 01/18/2015.  Bone marrow biopsy on 11/10/2014 revealed a 10% monoclonal plasma cell infiltrate. Marrow was hypercellular for age (40-50%) with mixed maturing hematopoiesis, relative erythroid hyperplasia and mild nonspecific  dyserythropoiesis. There was patchy mild focally moderate increase in reticulin. There were no significant iron stores. 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 Beta2 microglobulin was 4.2 on 11/24/2014.  PET scan on 12/07/2014 revealed no hypermetabolic foci within the marrow space or nodal stations to suggest active multiple myeloma. There was hypermetabolism within the right lower lobe, corresponding to a similar dependent density. Morphology favored scarring. Given the hypermetabolism, consideration for follow-up chest CT documenting stability at 3-6 months was recommended. There was cirrhosis and portal venous hypertension, incompletely characterized. There was gastric body hypermetabolism favoring physiologic. There was pulmonary artery enlargement suggesting pulmonary artery hypertension. 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  Symptomatically, he has had abdominal pain and cramping, occasional nausea, and black stools.  He appears to have pancreatic insufficiency as Creon is helping improve his symptoms. He is scheduled for upper and lower endoscopy on 03/04/2015.  He denies any bone pain.  He has had no interval infections.  Ferritin is 25.  Plan: 1.  Labs today:  CBC with diff, CMP, SPEP, free light chains, ferritin. 2.  Discuss interim GI evaluation and upcoming endoscopies. 3.  Continue ferrous sulfate.  Ferritin goal 100. 4.  Anticipate follow-up chest CT in 06/07/2015 to assess RLL hypermetabolism noted on PET scan. 5.  RTC in 2 months for MD assessment, labs (CBC with diff, CMP, SPEP, ferritin), and +/- Venofer next day.   Lequita Asal, MD  01/18/2015, 3:28 PM

## 2015-01-19 LAB — KAPPA/LAMBDA LIGHT CHAINS
Kappa free light chain: 23.99 mg/L — ABNORMAL HIGH (ref 3.30–19.40)
Kappa, lambda light chain ratio: 0.15 — ABNORMAL LOW (ref 0.26–1.65)
Lambda free light chains: 155.3 mg/L — ABNORMAL HIGH (ref 5.71–26.30)

## 2015-01-20 LAB — PROTEIN ELECTROPHORESIS, SERUM
A/G Ratio: 0.8 (ref 0.7–1.7)
Albumin ELP: 3.6 g/dL (ref 2.9–4.4)
Alpha-1-Globulin: 0.2 g/dL (ref 0.0–0.4)
Alpha-2-Globulin: 0.6 g/dL (ref 0.4–1.0)
Beta Globulin: 1 g/dL (ref 0.7–1.3)
Gamma Globulin: 2.6 g/dL — ABNORMAL HIGH (ref 0.4–1.8)
Globulin, Total: 4.4 g/dL — ABNORMAL HIGH (ref 2.2–3.9)
M-Spike, %: 2.3 g/dL — ABNORMAL HIGH
Total Protein ELP: 8 g/dL (ref 6.0–8.5)

## 2015-01-24 ENCOUNTER — Encounter: Payer: Self-pay | Admitting: Hematology and Oncology

## 2015-02-02 ENCOUNTER — Encounter: Payer: Self-pay | Admitting: Internal Medicine

## 2015-02-02 ENCOUNTER — Telehealth: Payer: Self-pay | Admitting: *Deleted

## 2015-02-02 NOTE — Telephone Encounter (Signed)
Patient requested a call back in regards to his blood sugar being 400 after lunch. Patient requested a call, to consult on this issue. Please advise

## 2015-02-02 NOTE — Telephone Encounter (Signed)
If his sugars are running 400 range, he needs to be seen.  Sounds like he needs to be started on insulin.  If he is agreeable, then acute care or [facility] today, since I am unable to work him in.  If he refuses evaluation today, then needs to be seen tomorrow and I spoke to [place].  Dr Caryl Bis could see him tomorrow am and then can f/u with me.  Thanks

## 2015-02-02 NOTE — Telephone Encounter (Signed)
Spoke with the patient's wife, Patient was diagnosis ed with multiple myeloma, he has an enlarged spleen, cirrhosis of the liver and sluggish veins.  Patient has been having consistent  Diarrhea with stomach pains.  He is due to have a colonoscopy and endoscopy in January to figure out were his loss of blood is coming from as his platelets are down again.  He also has 2-3 polyps in his stomach.  Wife was equally concerned with his Blood sugars being above 400.  She will be here at the appointment tomorrow morning with Dr. Caryl Bis.

## 2015-02-02 NOTE — Telephone Encounter (Signed)
Spoke with the patient, he was started on Pancrealipase (Zenpep) for a procedure that is going to be done in January on the 12th.  He takes 2 capsules by mouth at breakfast, lunch and dinner and then 1 capsule with snacks.  Patient knows this is why his sugar is up, so he has stopped the medications.  His sugar this morning was 449 and then remained over 400 at lunch.  Please advise?

## 2015-02-02 NOTE — Telephone Encounter (Signed)
He told me that they told him the meds might make it go up I am assuming because he has no other symptoms, no prednisone.

## 2015-02-02 NOTE — Telephone Encounter (Signed)
I just sent him a my chart message.  See message.  I did not know if he was having any other symptoms i.e., nausea, vomiting, etc?  Did not know if he was on prednisone.  If his sugars are this high, he needs to be evaluated and start insulin.  If any other acute symptoms, may need fluids, etc.

## 2015-02-02 NOTE — Telephone Encounter (Signed)
Per Lavella Lemons, pt's is out hunting now.  No nausea or vomiting.  Blood sugar 370.  Declines evaluation this pm.   Will keep appt in am.

## 2015-02-03 ENCOUNTER — Ambulatory Visit (INDEPENDENT_AMBULATORY_CARE_PROVIDER_SITE_OTHER): Payer: PPO | Admitting: Family Medicine

## 2015-02-03 ENCOUNTER — Telehealth: Payer: Self-pay | Admitting: Internal Medicine

## 2015-02-03 ENCOUNTER — Encounter: Payer: Self-pay | Admitting: Family Medicine

## 2015-02-03 VITALS — BP 138/76 | HR 70 | Temp 97.9°F | Ht 69.0 in | Wt 190.0 lb

## 2015-02-03 DIAGNOSIS — E119 Type 2 diabetes mellitus without complications: Secondary | ICD-10-CM

## 2015-02-03 LAB — BASIC METABOLIC PANEL
BUN: 23 mg/dL (ref 6–23)
CHLORIDE: 104 meq/L (ref 96–112)
CO2: 24 meq/L (ref 19–32)
CREATININE: 1.17 mg/dL (ref 0.40–1.50)
Calcium: 9.4 mg/dL (ref 8.4–10.5)
GFR: 64.6 mL/min (ref >60.00)
GLUCOSE: 287 mg/dL — AB (ref 70–99)
Potassium: 4.6 mEq/L (ref 3.5–5.1)
Sodium: 136 mEq/L (ref 135–145)

## 2015-02-03 MED ORDER — METFORMIN HCL 850 MG PO TABS: 850.0000 mg | ORAL_TABLET | Freq: Three times a day (TID) | ORAL | Status: DC

## 2015-02-03 NOTE — Telephone Encounter (Signed)
Spoke with pt and he states that his blood sugar has been very high and he had to see his doctor yesterday am. Pt thinks the zenpep has caused this increase. He has stopped taking the zenpep but wanted to know Dr. Vena Rua thoughts. Please advise.

## 2015-02-03 NOTE — Progress Notes (Signed)
Pre visit review using our clinic review tool, if applicable. No additional management support is needed unless otherwise documented below in the visit note. 

## 2015-02-03 NOTE — Assessment & Plan Note (Signed)
Sugars are not controlled at this time. He had one CBG yesterday of 425, though this appears to be out of normal for him. His typical ranges 220s to 240s. He was asymptomatic with this blood sugar. Given this I had a discussion about his medication regimen. We will increase his metformin to 850 mg 3 times a day prior to meals. He will continue his glipizide as he has been. We will check a BMP today. He'll monitor his blood sugar daily fasting and prior to another meal. He'll contact the specialist who prescribed zenpep and let them know that he came off of this recently. He will follow-up in a month with Dr. Nicki Reaper. He is given return precautions.

## 2015-02-03 NOTE — Telephone Encounter (Signed)
I would not expect Zenpep to raise blood sugars He should continue to have blood sugars closely monitor by primary care We are/were using Zenpep to prevent abdominal pain and diarrhea episodes that were severe for him. My impression was this was working. If so I recommend resuming Zenpep as we proceed with his evaluation pending endoscopy and colonoscopy

## 2015-02-03 NOTE — Patient Instructions (Signed)
Nice to meet you. You increase your metformin to 850 mg 3 times daily with meals. She continue current dose of glipizide. He should monitor your blood sugar fasting in the morning and then at least one other time during the day prior to meal. You need to contact your specialist regarding the zenpep. If your blood sugar starts to run greater than 300, or you develop shakiness, sweatiness, nausea, vomiting, headache, abdominal pain, fevers, or any new or change in symptoms please seek medical attention.

## 2015-02-03 NOTE — Progress Notes (Signed)
Patient ID: Zachary Buck, male   DOB: 02/21/1941, 74 y.o.   MRN: [id number]  Tommi Rumps, MD Phone: [phone]  Zachary Buck is a 74 y.o. male who presents today for same-day visit.  DIABETES Disease Monitoring: Blood Sugar ranges-typically to 225-240, though yesterday had a blood sugar 425 in the morning. Patient was asymptomatic with this blood sugar. He denies headache, nausea, vomiting, abdominal pain, and fevers. He notes he recently had an upper respiratory infection though this is improved. He has been on zenpep recently which can cause hyperglycemia. Patient notes this morning he checked his blood sugar was 240. Last night was 243. Polyuria/phagia/dipsia- notes some mild polyuria, though no polydipsia or polyphagia     Medications: Compliance- takes glipizide 5 mg in the morning and 10 mg at night. Takes metformin 1000 mg twice daily. Hypoglycemic symptoms- no   PMH: nonsmoker.   ROS see history of present illness  Objective  Physical Exam Filed Vitals:   02/03/15 0759  BP: 138/76  Pulse: 70  Temp: 97.9 F (36.6 C)    Physical Exam  Constitutional: He is well-developed, well-nourished, and in no distress.  HENT:  Head: Normocephalic and atraumatic.  Right Ear: External ear normal.  Left Ear: External ear normal.  Nose: Nose normal.  Mouth/Throat: Oropharynx is clear and moist. No oropharyngeal exudate.  Eyes: Conjunctivae are normal. Pupils are equal, round, and reactive to light.  Neck: Neck supple.  Cardiovascular: Normal rate and regular rhythm.   Pulmonary/Chest: Effort normal and breath sounds normal.  Lymphadenopathy:    He has no cervical adenopathy.  Skin: Skin is warm and dry. He is not diaphoretic.     Assessment/Plan: Please see individual problem list.  Diabetes Sugars are not controlled at this time. He had one CBG yesterday of 425, though this appears to be out of normal for him. His typical ranges 220s to 240s. He  was asymptomatic with this blood sugar. Given this I had a discussion about his medication regimen. We will increase his metformin to 850 mg 3 times a day prior to meals. He will continue his glipizide as he has been. We will check a BMP today. He'll monitor his blood sugar daily fasting and prior to another meal. He'll contact the specialist who prescribed zenpep and let them know that he came off of this recently. He will follow-up in a month with Dr. Nicki Reaper. He is given return precautions.    Orders Placed This Encounter  Procedures  . Basic Metabolic Panel (BMET)    Meds ordered this encounter  Medications  . metFORMIN (GLUCOPHAGE) 850 MG tablet    Sig: Take 1 tablet (850 mg total) by mouth 3 (three) times daily with meals.    Dispense:  90 tablet    Refill:  2    Dragon voice recognition software was used during the dictation process of this note. If any phrases or words seem inappropriate it is likely secondary to the translation process being inefficient.  Tommi Rumps

## 2015-02-03 NOTE — Telephone Encounter (Signed)
Noted. Seen in office today.

## 2015-02-04 ENCOUNTER — Encounter: Payer: Self-pay | Admitting: Family Medicine

## 2015-02-04 NOTE — Telephone Encounter (Signed)
Spoke with pt and he is aware. States he is going to stay off of the Zenpep because he does not think it was helping with the diarrhea or pain and his PCP feels it may have been causing the elevated sugars. Pt knows to keep procedure appts.

## 2015-02-19 ENCOUNTER — Encounter (HOSPITAL_COMMUNITY): Payer: Self-pay | Admitting: *Deleted

## 2015-03-03 NOTE — Anesthesia Preprocedure Evaluation (Addendum)
Anesthesia Evaluation  Patient identified by MRN, date of birth, ID band Patient awake    Reviewed: Allergy & Precautions, NPO status , Patient's Chart, lab work & pertinent test results  Airway Mallampati: III  TM Distance: >3 FB Neck ROM: Full    Dental  (+) Teeth Intact   Pulmonary neg pulmonary ROS,    breath sounds clear to auscultation       Cardiovascular hypertension, Pt. on medications + CAD and + Past MI   Rhythm:Regular Rate:Normal     Neuro/Psych negative neurological ROS  negative psych ROS   GI/Hepatic Neg liver ROS, GERD  Medicated,  Endo/Other  diabetes  Renal/GU negative Renal ROS  negative genitourinary   Musculoskeletal negative musculoskeletal ROS (+)   Abdominal   Peds negative pediatric ROS (+)  Hematology negative hematology ROS (+)   Anesthesia Other Findings   Reproductive/Obstetrics negative OB ROS                            Lab Results  Component Value Date   WBC 5.9 01/18/2015   HGB 11.9* 01/18/2015   HCT 35.5* 01/18/2015   MCV 84.2 01/18/2015   PLT 113* 01/18/2015   Lab Results  Component Value Date   CREATININE 1.17 02/03/2015   BUN 23 02/03/2015   NA 136 02/03/2015   K 4.6 02/03/2015   CL 104 02/03/2015   CO2 24 02/03/2015   08/2014 EKG: normal sinus rhythm.    Anesthesia Physical Anesthesia Plan  ASA: III  Anesthesia Plan: MAC   Post-op Pain Management:    Induction: Intravenous  Airway Management Planned: Natural Airway  Additional Equipment:   Intra-op Plan:   Post-operative Plan:   Informed Consent: I have reviewed the patients History and Physical, chart, labs and discussed the procedure including the risks, benefits and alternatives for the proposed anesthesia with the patient or authorized representative who has indicated his/her understanding and acceptance.     Plan Discussed with: CRNA  Anesthesia Plan Comments:  (No midazolam per patient wife request. )       Anesthesia Quick Evaluation

## 2015-03-04 ENCOUNTER — Ambulatory Visit (HOSPITAL_COMMUNITY): Payer: PPO | Admitting: Anesthesiology

## 2015-03-04 ENCOUNTER — Ambulatory Visit (HOSPITAL_COMMUNITY)
Admission: RE | Admit: 2015-03-04 | Discharge: 2015-03-04 | Disposition: A | Discharge status: Home / Self Care | Payer: PPO | Source: Ambulatory Visit | Attending: Internal Medicine | Admitting: Internal Medicine

## 2015-03-04 ENCOUNTER — Encounter (HOSPITAL_COMMUNITY)
Admission: RE | Disposition: A | Discharge status: Home / Self Care | Payer: Self-pay | Source: Ambulatory Visit | Attending: Internal Medicine

## 2015-03-04 ENCOUNTER — Encounter (HOSPITAL_COMMUNITY): Payer: Self-pay | Admitting: Registered Nurse

## 2015-03-04 DIAGNOSIS — I2510 Atherosclerotic heart disease of native coronary artery without angina pectoris: Secondary | ICD-10-CM | POA: Diagnosis not present

## 2015-03-04 DIAGNOSIS — K317 Polyp of stomach and duodenum: Secondary | ICD-10-CM | POA: Diagnosis not present

## 2015-03-04 DIAGNOSIS — Z86010 Personal history of colonic polyps: Secondary | ICD-10-CM | POA: Diagnosis not present

## 2015-03-04 DIAGNOSIS — K31819 Angiodysplasia of stomach and duodenum without bleeding: Secondary | ICD-10-CM | POA: Diagnosis not present

## 2015-03-04 DIAGNOSIS — K766 Portal hypertension: Secondary | ICD-10-CM | POA: Insufficient documentation

## 2015-03-04 DIAGNOSIS — D123 Benign neoplasm of transverse colon: Secondary | ICD-10-CM

## 2015-03-04 DIAGNOSIS — D509 Iron deficiency anemia, unspecified: Secondary | ICD-10-CM | POA: Insufficient documentation

## 2015-03-04 DIAGNOSIS — R1013 Epigastric pain: Secondary | ICD-10-CM | POA: Diagnosis not present

## 2015-03-04 DIAGNOSIS — K219 Gastro-esophageal reflux disease without esophagitis: Secondary | ICD-10-CM | POA: Diagnosis not present

## 2015-03-04 DIAGNOSIS — K921 Melena: Secondary | ICD-10-CM | POA: Insufficient documentation

## 2015-03-04 DIAGNOSIS — R195 Other fecal abnormalities: Secondary | ICD-10-CM | POA: Diagnosis not present

## 2015-03-04 DIAGNOSIS — R197 Diarrhea, unspecified: Secondary | ICD-10-CM | POA: Diagnosis not present

## 2015-03-04 DIAGNOSIS — I8510 Secondary esophageal varices without bleeding: Secondary | ICD-10-CM | POA: Diagnosis not present

## 2015-03-04 DIAGNOSIS — I8500 Esophageal varices without bleeding: Secondary | ICD-10-CM | POA: Diagnosis not present

## 2015-03-04 DIAGNOSIS — K449 Diaphragmatic hernia without obstruction or gangrene: Secondary | ICD-10-CM | POA: Insufficient documentation

## 2015-03-04 DIAGNOSIS — Z955 Presence of coronary angioplasty implant and graft: Secondary | ICD-10-CM | POA: Insufficient documentation

## 2015-03-04 DIAGNOSIS — E119 Type 2 diabetes mellitus without complications: Secondary | ICD-10-CM | POA: Diagnosis not present

## 2015-03-04 DIAGNOSIS — K5520 Angiodysplasia of colon without hemorrhage: Secondary | ICD-10-CM | POA: Diagnosis not present

## 2015-03-04 DIAGNOSIS — I252 Old myocardial infarction: Secondary | ICD-10-CM | POA: Insufficient documentation

## 2015-03-04 DIAGNOSIS — I10 Essential (primary) hypertension: Secondary | ICD-10-CM | POA: Insufficient documentation

## 2015-03-04 DIAGNOSIS — E7800 Pure hypercholesterolemia, unspecified: Secondary | ICD-10-CM | POA: Insufficient documentation

## 2015-03-04 DIAGNOSIS — N400 Enlarged prostate without lower urinary tract symptoms: Secondary | ICD-10-CM | POA: Insufficient documentation

## 2015-03-04 DIAGNOSIS — K7460 Unspecified cirrhosis of liver: Secondary | ICD-10-CM | POA: Insufficient documentation

## 2015-03-04 HISTORY — PX: ESOPHAGOGASTRODUODENOSCOPY (EGD) WITH PROPOFOL: SHX5813

## 2015-03-04 HISTORY — PX: COLONOSCOPY WITH PROPOFOL: SHX5780

## 2015-03-04 LAB — PROTIME-INR
INR: 1.11 (ref 0.00–1.49)
PROTHROMBIN TIME: 14.5 s (ref 11.6–15.2)

## 2015-03-04 LAB — GLUCOSE, CAPILLARY: GLUCOSE-CAPILLARY: 186 mg/dL — AB (ref 65–99)

## 2015-03-04 SURGERY — ESOPHAGOGASTRODUODENOSCOPY (EGD) WITH PROPOFOL
Anesthesia: Monitor Anesthesia Care

## 2015-03-04 MED ORDER — PROPOFOL 10 MG/ML IV BOLUS
INTRAVENOUS | Status: AC
Start: 2015-03-04 — End: 2015-03-04
  Filled 2015-03-04: qty 20

## 2015-03-04 MED ORDER — PROPOFOL 10 MG/ML IV BOLUS
INTRAVENOUS | Status: AC
  Filled 2015-03-04: qty 20

## 2015-03-04 MED ORDER — NADOLOL 20 MG PO TABS: 20.0000 mg | ORAL_TABLET | Freq: Every day | ORAL | Status: DC

## 2015-03-04 MED ORDER — PROPOFOL 10 MG/ML IV BOLUS
INTRAVENOUS | Status: AC
  Filled 2015-03-04: qty 60

## 2015-03-04 MED ORDER — PROPOFOL 500 MG/50ML IV EMUL
INTRAVENOUS | Status: DC | PRN
  Administered 2015-03-04: 350 ug/kg/min via INTRAVENOUS

## 2015-03-04 MED ORDER — GLYCOPYRROLATE 0.2 MG/ML IJ SOLN
INTRAMUSCULAR | Status: AC
  Filled 2015-03-04: qty 1

## 2015-03-04 MED ORDER — LABETALOL HCL 5 MG/ML IV SOLN
INTRAVENOUS | Status: DC | PRN
  Administered 2015-03-04: 2.5 mg via INTRAVENOUS

## 2015-03-04 MED ORDER — LIDOCAINE HCL (CARDIAC) 20 MG/ML IV SOLN
INTRAVENOUS | Status: DC | PRN
  Administered 2015-03-04: 50 mg via INTRAVENOUS

## 2015-03-04 MED ORDER — LACTATED RINGERS IV SOLN
INTRAVENOUS | Status: DC
  Administered 2015-03-04: 07:00:00 via INTRAVENOUS

## 2015-03-04 MED ORDER — LIDOCAINE HCL (CARDIAC) 20 MG/ML IV SOLN
INTRAVENOUS | Status: AC
  Filled 2015-03-04: qty 5

## 2015-03-04 MED ORDER — GLYCOPYRROLATE 0.2 MG/ML IJ SOLN
INTRAMUSCULAR | Status: DC | PRN
  Administered 2015-03-04: 0.2 mg via INTRAVENOUS

## 2015-03-04 MED ORDER — LABETALOL HCL 5 MG/ML IV SOLN
INTRAVENOUS | Status: AC
  Filled 2015-03-04: qty 4

## 2015-03-04 SURGICAL SUPPLY — 24 items

## 2015-03-04 NOTE — Discharge Instructions (Signed)

## 2015-03-04 NOTE — Op Note (Signed)
[facility] [place], [address]   COLONOSCOPY PROCEDURE REPORT  PATIENT: Zachary Buck, Zachary Buck  MR#: [id number] BIRTHDATE: 02/07/1941 , 64  yrs. old GENDER: male ENDOSCOPIST: Jerene Bears, MD PROCEDURE DATE:  03/04/2015 PROCEDURE:   Colonoscopy, diagnostic First Screening Colonoscopy - Avg.  risk and is 50 yrs.  old or older - No.  Prior Negative Screening - Now for repeat screening. N/A  History of Adenoma - Now for follow-up colonoscopy & has been > or = to 3 yrs.  N/A  Polyps removed today? Yes ASA CLASS:   Class III INDICATIONS:iron deficiency anemia, heme positive stool, polyp seen by video capsule study, explosive diarrhea. MEDICATIONS: Monitored anesthesia care and Per Anesthesia  DESCRIPTION OF PROCEDURE:   After the risks benefits and alternatives of the procedure were thoroughly explained, informed consent was obtained.  The digital rectal exam revealed no rectal mass and revealed several skin tags.   The Pentax Ped Colon [id number] endoscope was introduced through the anus and advanced to the terminal ileum which was intubated for a short distance. No adverse events experienced.   The quality of the prep was good.  (Suprep was used)  The instrument was then slowly withdrawn as the colon was fully examined. Estimated blood loss is zero unless otherwise noted in this procedure report.   COLON FINDINGS: The examined terminal ileum for 15-20 cm appeared to be normal.   A sessile polyp measuring 6 mm in size was found in the transverse colon.  A polypectomy was performed with a cold snare.  The resection was complete, the polyp tissue was completely retrieved and sent to histology.   The colonic mucosa appeared normal throughout the entire examined colon.  Multiple random biopsies were performed using cold forceps.  Samples were sent to R/O microscopic colitis.  Retroflexed views revealed internal hemorrhoids. The time to cecum = 2.9  Withdrawal time = 19.7   The scope was withdrawn and the procedure completed. COMPLICATIONS: There were no immediate complications.  ENDOSCOPIC IMPRESSION: 1.   The examined terminal ileum appeared to be normal 2.   Sessile polyp was found in the transverse colon; polypectomy was performed with a cold snare 3.   The colonic mucosa appeared normal throughout the entire examined colon; multiple random biopsies were performed using cold forceps  RECOMMENDATIONS: 1.  Await pathology results 2.  Other recommendations per EGD report  eSigned:  Jerene Bears, MD 03/04/2015 10:51 AM   cc:  the patient, PCP   PATIENT NAME:  Zachary Buck, Zachary Buck MR#: [id number]

## 2015-03-04 NOTE — Transfer of Care (Signed)
Immediate Anesthesia Transfer of Care Note  Patient: Zachary Buck  Procedure(s) Performed: Procedure(s): ESOPHAGOGASTRODUODENOSCOPY (EGD) WITH PROPOFOL (N/A) COLONOSCOPY WITH PROPOFOL (N/A)  Patient Location: PACU  Anesthesia Type:MAC  Level of Consciousness: awake, alert , oriented and patient cooperative  Airway & Oxygen Therapy: Patient Spontanous Breathing and Patient connected to face mask oxygen amd nasal cannula  Post-op Assessment: Report given to RN, Post -op Vital signs reviewed and stable and Patient moving all extremities X 4  Post vital signs: stable  Last Vitals:  Filed Vitals:   03/04/15 0656  BP: 165/82  Pulse: 65  Temp: 36.8 C  Resp: 15    Complications: No apparent anesthesia complications

## 2015-03-04 NOTE — Anesthesia Postprocedure Evaluation (Signed)
Anesthesia Post Note  Patient: Zachary Buck  Procedure(s) Performed: Procedure(s) (LRB): ESOPHAGOGASTRODUODENOSCOPY (EGD) WITH PROPOFOL (N/A) COLONOSCOPY WITH PROPOFOL (N/A)  Patient location during evaluation: Endoscopy Anesthesia Type: MAC Level of consciousness: awake and alert Pain management: pain level controlled Vital Signs Assessment: post-procedure vital signs reviewed and stable Respiratory status: spontaneous breathing, nonlabored ventilation, respiratory function stable and patient connected to nasal cannula oxygen Cardiovascular status: stable and blood pressure returned to baseline Anesthetic complications: no    Last Vitals:  Filed Vitals:   03/04/15 1045 03/04/15 1050  BP:    Pulse: 70 67  Temp:    Resp: 16 22    Last Pain: There were no vitals filed for this visit.               Effie Berkshire

## 2015-03-04 NOTE — Op Note (Signed)
[facility] [place], [address]   ENDOSCOPY PROCEDURE REPORT  PATIENT: Zachary, Buck  MR#: [id number] BIRTHDATE: Feb 20, 1941 , 26  yrs. old GENDER: male ENDOSCOPIST: Jerene Bears, MD PROCEDURE DATE:  03/04/2015 PROCEDURE:  EGD, diagnostic, EGD w/ snare polypectomy, EGD w/ biopsy , and EGD w/ ablation ASA CLASS:     Class III INDICATIONS:  IRON DEFICIENCY ANEMIA, CIRRHOSIS BY IMAGING, GASTRIC POLYP BY CAPSULE, INTERMITTENT ABDOMINAL PAIN WITH EXPLOSIVE DIARRHEA MEDICATIONS: Monitored anesthesia care and Per Anesthesia TOPICAL ANESTHETIC: none  DESCRIPTION OF PROCEDURE: After the risks benefits and alternatives of the procedure were thoroughly explained, informed consent was obtained.  The Pentax Gastroscope [id number] endoscope was introduced through the mouth and advanced to the third portion of the duodenum , Without limitations.  The instrument was slowly withdrawn as the mucosa was fully examined.       ESOPHAGUS: There were 3 columns of medium sized varices in the mid esophagus and distal esophagus.  The varices were not bleeding and had no stigmata of recent bleeding.  STOMACH: A sessile polyp measuring 9 mm in size was found in the gastric antrum.  A polypectomy was performed with snare cautery. The resection was complete and the polyp tissue was completely retrieved.  To prevent future bleeding two [place] Resolution hemoclips were placed on the site.   The stomach otherwise appeared normal.  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  Argon plasma coagulation was applied to the sites with good treatment effect.   The duodenal mucosa showed no abnormalities in the bulb and 2nd part of the duodenum and 3rd part duodenum.  Cold forceps biopsies were taken in the bulb and second portion.  Retroflexed views revealed a small hiatal hernia.     The scope was then withdrawn  from the patient and the procedure completed.  COMPLICATIONS: There were no immediate complications.  ENDOSCOPIC IMPRESSION: 1.   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   Sessile polyp was found in the gastric antrum; polypectomy was performed; To prevent future bleeding two hemoclips were placed on the sites 2.   The stomach otherwise appeared normal 3.   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   The duodenal mucosa showed no abnormalities in the bulb and 2nd part of the duodenum and 3rd part duodenum cold forceps biopsies were taken in the bulb and second portion  RECOMMENDATIONS: 1.  Await biopsy results 2.  Proceed with a colonoscopy. 3.  Begin nadolol given varices for prophylaxis of bleeding 4.  Avoid all NSAIDs 5.  Office follow-up with me  eSigned:  Jerene Bears, MD 03/04/2015 10:14 AM    CC: the patient, PCP  PATIENT NAME:  Zachary, Buck MR#: [id number]

## 2015-03-04 NOTE — H&P (Signed)
HPI: Zachary Buck is a 75 yo with PMH of colon polyps, IDA, MM, gastritis and gastric polyps seen by [facility] with heme + stools who presents for EGD and colonoscopy. He's having ongoing explosive diarrhea and crampy abdominal pain. Symptoms resolve after bowel movements. Further evaluation plan today. No abdominal pain today, no fevers or chills  Past Medical History  Diagnosis Date  . Hypercholesterolemia   . Atherosclerotic heart disease     s/p MI, s/p stent mid circumflex  . Hypertension   . Diabetes mellitus without complication ([place])   . Clavicle fracture   . Leg fracture   . Myocardial infarct ([place])   . CAD (coronary artery disease)   . Prostate enlargement   . Internal hemorrhoids   . Anemia     Past Surgical History  Procedure Laterality Date  . Tonsillectomy    . Coronary angioplasty with stent placement       (Not in an outpatient encounter)  Allergies  Allergen Reactions  . Niaspan [Niacin Er]     Family History  Problem Relation Age of Onset  . Aneurysm Father 4    of the heart  . Diabetes Mother   . Heart attack Father     Social History  Substance Use Topics  . Smoking status: Never Smoker   . Smokeless tobacco: Never Used  . Alcohol Use: No    ROS: As per history of present illness, otherwise negative  BP 165/82 mmHg  Pulse 65  Temp(Src) 98.2 F (36.8 C) (Oral)  Resp 15  Ht 5\' 9"  (1.753 m)  Wt 180 lb (81.647 kg)  BMI 26.57 kg/m2  SpO2 97% Gen: awake, alert, NAD HEENT: anicteric, op clear CV: RRR, no mrg Pulm: CTA b/l Abd: soft, NT/ND, +BS throughout Ext: no c/c/e Neuro: nonfocal  RELEVANT LABS AND IMAGING: CBC    Component Value Date/Time   WBC 5.9 01/18/2015 1415   WBC 6.0 11/18/2013 1053   RBC 4.21* 01/18/2015 1415   RBC 4.24* 10/09/2014 1056   RBC 4.80 11/18/2013 1053   HGB 11.9* 01/18/2015 1415   HGB 13.1 11/18/2013 1053   HCT 35.5* 01/18/2015 1415   HCT 40.4 11/18/2013 1053   PLT 113* 01/18/2015 1415   PLT 152  11/18/2013 1053   MCV 84.2 01/18/2015 1415   MCV 84 11/18/2013 1053   MCH 28.3 01/18/2015 1415   MCH 27.3 11/18/2013 1053   MCHC 33.6 01/18/2015 1415   MCHC 32.5 11/18/2013 1053   RDW 15.9* 01/18/2015 1415   RDW 14.9* 11/18/2013 1053   LYMPHSABS 1.3 01/18/2015 1415   MONOABS 0.7 01/18/2015 1415   EOSABS 0.3 01/18/2015 1415   BASOSABS 0.0 01/18/2015 1415    CMP     Component Value Date/Time   NA 136 02/03/2015 0835   K 4.6 02/03/2015 0835   CL 104 02/03/2015 0835   CO2 24 02/03/2015 0835   GLUCOSE 287* 02/03/2015 0835   BUN 23 02/03/2015 0835   CREATININE 1.17 02/03/2015 0835   CREATININE 1.43* 08/30/2012 1615   CALCIUM 9.4 02/03/2015 0835   PROT 8.5* 01/18/2015 1415   PROT 8.3* 11/18/2013 1053   ALBUMIN 3.6 01/18/2015 1415   ALBUMIN 3.3* 11/18/2013 1053   AST 17 01/18/2015 1415   AST 29 11/18/2013 1053   ALT 40 01/18/2015 1415   ALT 36 11/18/2013 1053   ALKPHOS 76 01/18/2015 1415   ALKPHOS 58 11/18/2013 1053   BILITOT 1.0 01/18/2015 1415   BILITOT 0.6 11/18/2013 Gravity  57* 01/18/2015 1415   GFRNONAA 49* 08/30/2012 1615   GFRAA >60 01/18/2015 1415   GFRAA 56* 08/30/2012 1615    ASSESSMENT/PLAN:  75 yo with PMH of colon polyps, IDA, MM, gastritis and gastric polyps seen by [facility] with heme + stools who presents for EGD and colonoscopy.  1. IDA/epigastric pain/stomach and colon polyps seen by capsule/diarrhea/heme positive stool/cirrhosis by imaging -- plan for upper endoscopy and colonoscopy for multiple reasons today. We discussed the risk benefits and alternatives and he is agreeable to proceed. Esophageal variceal screening, evaluation heme positive stool and possible gastric polypectomy. We discussed high risk of gastric polypectomy for bleeding. INRs checked and 1.1. Plan random colon biopsies and evaluation of colon polyp. Colon polyp seen by video capsule.

## 2015-03-05 ENCOUNTER — Encounter (HOSPITAL_COMMUNITY): Payer: Self-pay | Admitting: Internal Medicine

## 2015-03-09 ENCOUNTER — Telehealth: Payer: Self-pay | Admitting: Internal Medicine

## 2015-03-09 NOTE — Telephone Encounter (Signed)
Please share the following results with the patient and/or his wife along with additional plan Pathology results reviewed Gastric polyp was hyperplastic without evidence of cancer -- these polyps can have precancerous potential and it has now been removed  Small bowel biopsies were normal without evidence of celiac disease or inflammatory enteritis Colon biopsies unremarkable without evidence of colitis Polyp removed from colon was tubular adenoma or precancerous polyp without cancer. Please recall for colonoscopy in 5 years (dependent on overall health at that time)  No explanation for the patient's episodic abdominal pain after this evaluation and previous capsule endoscopy I recommend he continue daily pantoprazole 40 mg He definitely needs nadolol 20 mg daily given esophageal varices related to cirrhosis found recently at endoscopy He can discontinue pancreatic enzyme replacement PET scan reviewed from earlier this year and dedicated CT scan with pre-and post contrast of the abdomen recommended for further evaluation of episodic abdominal pain I understand that these episodes are severe and limiting to his function If CT unrevealing we may need to seek second opinion

## 2015-03-09 NOTE — Telephone Encounter (Signed)
Pts wife called and is upset because the follow-up OV is not scheduled until March. Pts appt moved up to 03/23/15@11am . Wife states he is still having lots of abdominal pain and diarrhea along with cramping. Reports he cannot preach sometimes due to the discomfort. Wife wants to know if there is something else that can be done or prescribed prior to the appt on 03/23/15. Please advise.

## 2015-03-10 ENCOUNTER — Other Ambulatory Visit: Payer: Self-pay

## 2015-03-10 DIAGNOSIS — R1084 Generalized abdominal pain: Secondary | ICD-10-CM

## 2015-03-10 NOTE — Telephone Encounter (Signed)
Spoke with patient and his wife and they are aware of results and Dr. Vena Rua recommendations. Pt scheduled for CT of ABD at [place] CT 03/16/15@3pm , pt to be NPO after 1pm except for 1 bottle of contrast. Pt to pick up contrast from our office.

## 2015-03-10 NOTE — Telephone Encounter (Signed)
Wife also states they need to move OV appt back to March, they forgot about an appt with hematologist in [place] on 03/23/15. Pt rescheduled to 04/30/15@10am .

## 2015-03-16 ENCOUNTER — Ambulatory Visit (INDEPENDENT_AMBULATORY_CARE_PROVIDER_SITE_OTHER)
Admission: RE | Admit: 2015-03-16 | Discharge: 2015-03-16 | Disposition: A | Discharge status: Home / Self Care | Payer: PPO | Source: Ambulatory Visit | Attending: Internal Medicine | Admitting: Internal Medicine

## 2015-03-16 ENCOUNTER — Telehealth: Payer: Self-pay | Admitting: *Deleted

## 2015-03-16 ENCOUNTER — Telehealth: Payer: Self-pay | Admitting: Internal Medicine

## 2015-03-16 DIAGNOSIS — R1084 Generalized abdominal pain: Secondary | ICD-10-CM

## 2015-03-16 DIAGNOSIS — K7460 Unspecified cirrhosis of liver: Secondary | ICD-10-CM | POA: Diagnosis not present

## 2015-03-16 MED ORDER — IOHEXOL 300 MG/ML  SOLN
100.0000 mL | Freq: Once | INTRAMUSCULAR | Status: AC | PRN
  Administered 2015-03-16: 100 mL via INTRAVENOUS

## 2015-03-16 NOTE — Telephone Encounter (Signed)
Pt to hold Metformin for 48 hours after CT Scan. Spoke with pts wife and she is aware.

## 2015-03-16 NOTE — Telephone Encounter (Signed)
Zachary Buck with CT called to state that they asked patient to hold metformin 48 hours after CT (as per protocol) and he stated that if he did not take his metformin for 48 hours, his blood sugars would be too "out of wack." Zachary Buck is just calling to inform us that they did ask patient to hold Metformin but that it seems he still plans on taking the medication anyways.

## 2015-03-17 DIAGNOSIS — R002 Palpitations: Secondary | ICD-10-CM | POA: Diagnosis not present

## 2015-03-17 DIAGNOSIS — I25118 Atherosclerotic heart disease of native coronary artery with other forms of angina pectoris: Secondary | ICD-10-CM | POA: Diagnosis not present

## 2015-03-17 DIAGNOSIS — E782 Mixed hyperlipidemia: Secondary | ICD-10-CM | POA: Diagnosis not present

## 2015-03-17 DIAGNOSIS — E139 Other specified diabetes mellitus without complications: Secondary | ICD-10-CM | POA: Diagnosis not present

## 2015-03-17 DIAGNOSIS — R0602 Shortness of breath: Secondary | ICD-10-CM | POA: Diagnosis not present

## 2015-03-17 DIAGNOSIS — I10 Essential (primary) hypertension: Secondary | ICD-10-CM | POA: Diagnosis not present

## 2015-03-19 ENCOUNTER — Other Ambulatory Visit: Payer: Self-pay

## 2015-03-19 ENCOUNTER — Telehealth: Payer: Self-pay | Admitting: Internal Medicine

## 2015-03-19 MED ORDER — GLYCOPYRROLATE 2 MG PO TABS: 2.0000 mg | ORAL_TABLET | Freq: Three times a day (TID) | ORAL | Status: DC

## 2015-03-19 NOTE — Telephone Encounter (Signed)
Given his debilitating abdominal pain attacks , I am trying to find something that will help him after thorough eval recently This medication can cause palpitations. He could try it and see how he tolerates it first? Next  Option would be second opinion at tertiary care center for recurrent abdominal pain

## 2015-03-19 NOTE — Telephone Encounter (Signed)
Dr. Hilarie Fredrickson this pt and his wife are concerned about taking Robinul forte due to his history of heart attack, diabetes, and other medications. Please advise.

## 2015-03-21 ENCOUNTER — Encounter: Payer: Self-pay | Admitting: Hematology and Oncology

## 2015-03-21 NOTE — Progress Notes (Signed)
[facility] day: 12/10/2014  Chief Complaint: Zachary Buck is a 75 y.o. male  with anemia who is seen for review of interval PET scan and discussion regarding direction of therapy.  HPI: The patient was last seen in the medical oncology clinic on 11/24/2014.  At that time, bone marrow was reviewed.  Bone marrow aspirate and biopsy on 11/10/2014 revealed a 10% monoclonal plasma cell infiltrate.  Marrow was hypercellular for age (40-50%) with mixed maturing hematopoiesis, relative erythroid hyperplasia and mild nonspecific dyserythropoiesis. There was patchy mild focally moderate increase in reticulin. There were no significant iron stores. Myeloma FISH panel revealed translocation (11;14) which results in fusion of CCND1 (BCL1) at 11q13 with the immunoglobulin heavy chain gene (IgH) at 14q32.     At last visit, he was scheduled for a capsule study on 11/27/2014. He was holding his oral iron. He felt a little bit better at since being on oral iron.  He states that the capsule study showed nothing remarkable,  There was a polyp.    He underwent PET scan on 12/07/2014.  PET scan revealed no hypermetabolic foci within the marrow space or nodal stations to suggest active multiple myeloma. There was  hypermetabolism within the right lower lobe, corresponding to a similar dependent density. Morphology favored scarring.  Given the hypermetabolism, consideration for follow-up chest CT documenting stability at 3-6 months was recommended. There was cirrhosis and portal venous hypertension, incompletely characterized. There was gastric body hypermetabolism favoring physiologic. There was pulmonary artery enlargement suggesting pulmonary artery hypertension. 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  Symptomatically, he denies any other concerns. He has been on iron 3 tablets a day for the past 2 weeks.  Past Medical History  Diagnosis Date  .  Hypercholesterolemia   . Atherosclerotic heart disease     s/p MI, s/p stent mid circumflex  . Hypertension   . Diabetes mellitus without complication ([place])   . Clavicle fracture   . Leg fracture   . Myocardial infarct ([place])   . CAD (coronary artery disease)   . Prostate enlargement   . Internal hemorrhoids   . Anemia     Past Surgical History  Procedure Laterality Date  . Tonsillectomy    . Coronary angioplasty with stent placement    . Esophagogastroduodenoscopy (egd) with propofol N/A 03/04/2015    Procedure: ESOPHAGOGASTRODUODENOSCOPY (EGD) WITH PROPOFOL;  Surgeon: Jerene Bears, MD;  Location: [facility];  Service: Gastroenterology;  Laterality: N/A;  . Colonoscopy with propofol N/A 03/04/2015    Procedure: COLONOSCOPY WITH PROPOFOL;  Surgeon: Jerene Bears, MD;  Location: [facility];  Service: Gastroenterology;  Laterality: N/A;    Family History  Problem Relation Age of Onset  . Aneurysm Father 62    of the heart  . Diabetes Mother   . Heart attack Father     Social History:  reports that he has never smoked. He has never used smokeless tobacco. He reports that he does not drink alcohol or use illicit drugs.  Patient denies any exposure to radiation or toxins.   Allergies:  Allergies  Allergen Reactions  . Niaspan [Niacin Er]     Current Medications: Current Outpatient Prescriptions  Medication Sig Dispense Refill  . blood glucose meter kit and supplies KIT Dispense based on patient and insurance preference. Use up to four times daily as directed. (FOR ICD-9 250.00, 250.01). 1 each 0  . Cholecalciferol (VITAMIN D-3) 1000 UNITS CAPS Take by mouth.    Marland Kitchen  glipiZIDE (GLUCOTROL) 5 MG tablet Use 5 mg in the morning and 10 mg in the evening. (Patient taking differently: Take 5-10 mg by mouth 2 (two) times daily. take 5 mg in the morning and 10 mg in the evening) 270 tablet 1  . glucose blood (ONE TOUCH ULTRA TEST) test strip Use as instructed 100 each 12  . Lancets  (ONETOUCH ULTRASOFT) lancets Use as instructed 100 each 12  . pantoprazole (PROTONIX) 40 MG tablet Take 1 tablet (40 mg total) by mouth daily. 3 tablet 3  . FERROUS SULFATE PO Take 1 tablet by mouth 3 (three) times daily.    Marland Kitchen glucose blood (BAYER CONTOUR NEXT TEST) test strip Contour Next EZ test strips: check blood sugars twice a day. Dx: 250.00    . glycopyrrolate (ROBINUL) 2 MG tablet Take 1 tablet (2 mg total) by mouth 3 (three) times daily. 60 tablet 2  . lisinopril (PRINIVIL,ZESTRIL) 20 MG tablet TAKE ONE TABLET BY MOUTH EVERY DAY    . metFORMIN (GLUCOPHAGE) 1000 MG tablet Take 500-1,000 mg by mouth 3 (three) times daily. Take 1000 mg in the morning and at night; take 500 mg at lunch time    . metFORMIN (GLUCOPHAGE) 850 MG tablet Take 1 tablet (850 mg total) by mouth 3 (three) times daily with meals. 90 tablet 2  . Na Sulfate-K Sulfate-Mg Sulf SOLN Suprep-Use as directed 354 mL 0  . nadolol (CORGARD) 20 MG tablet Take 1 tablet (20 mg total) by mouth daily. 30 tablet 11  . Omega-3 Fatty Acids (FISH OIL) 1000 MG CAPS Take 1,000 mg by mouth daily.     Marland Kitchen OVER THE COUNTER MEDICATION Take 4 capsules by mouth 3 (three) times daily. curamed 750 mg capsules     No current facility-administered medications for this visit.    Review of Systems:  GENERAL:  Feels good.  No fevers or sweats.  Weight stable. PERFORMANCE STATUS (ECOG):  1 HEENT:  No visual changes, runny nose, sore throat, mouth sores or tenderness. Lungs: No shortness of breath or cough.  No hemoptysis. Cardiac:  No chest pain, palpitations, orthopnea, or PND. GI:  Intermittent diarrhea and stomach pain.  No nausea, vomiting, constipation, melena or hematochezia. GU:  No urgency, frequency, dysuria, or hematuria. Musculoskeletal:  No back pain.  No joint pain.  No muscle tenderness. Extremities:  No pain or swelling. Skin:  No rashes or skin changes. Neuro:  No headache, numbness or weakness, balance or coordination  issues. Endocrine:  Diabetes.  No thyroid issues, hot flashes or night sweats. Psych:  No mood changes, depression or anxiety. Pain:  No focal pain. Review of systems:  All other systems reviewed and found to be negative.  Physical Exam: Blood pressure 130/69, pulse 80, temperature 98.3 F (36.8 C), temperature source Oral, weight 189 lb 13.1 oz (86.1 kg). GENERAL:  Well developed, well nourished, sitting comfortably in the exam room in no acute distress. MENTAL STATUS:  Alert and oriented to person, place and time. HEAD:  Pearline Cables hair.  Normocephalic, atraumatic, face symmetric, no Cushingoid features. EYES:  Blue eyes.  No conjunctivitis or scleral icterus. NEUROLOGICAL: Unremarkable. PSYCH:  Appropriate.  Office Visit on 12/09/2014  Component Date Value Ref Range Status  . WBC 12/09/2014 5.8  4.0 - 10.5 K/uL Final  . RBC 12/09/2014 4.15* 4.22 - 5.81 Mil/uL Final  . Hemoglobin 12/09/2014 11.6* 13.0 - 17.0 g/dL Final  . HCT 12/09/2014 34.5* 39.0 - 52.0 % Final  . MCV 12/09/2014 83.1  78.0 - 100.0 fl Final  . MCHC 12/09/2014 33.8  30.0 - 36.0 g/dL Final  . RDW 12/09/2014 16.7* 11.5 - 15.5 % Final  . Platelets 12/09/2014 116.0* 150.0 - 400.0 K/uL Final  . Neutrophils Relative % 12/09/2014 62.7  43.0 - 77.0 % Final  . Lymphocytes Relative 12/09/2014 20.0  12.0 - 46.0 % Final  . Monocytes Relative 12/09/2014 10.7  3.0 - 12.0 % Final  . Eosinophils Relative 12/09/2014 5.9* 0.0 - 5.0 % Final  . Basophils Relative 12/09/2014 0.7  0.0 - 3.0 % Final  . Neutro Abs 12/09/2014 3.6  1.4 - 7.7 K/uL Final  . Lymphs Abs 12/09/2014 1.2  0.7 - 4.0 K/uL Final  . Monocytes Absolute 12/09/2014 0.6  0.1 - 1.0 K/uL Final  . Eosinophils Absolute 12/09/2014 0.3  0.0 - 0.7 K/uL Final  . Basophils Absolute 12/09/2014 0.0  0.0 - 0.1 K/uL Final  . Hgb A1c MFr Bld 12/09/2014 7.6* 4.6 - 6.5 % Final   Glycemic Control Guidelines for People with Diabetes:Non Diabetic:  <6%Goal of Therapy: <7%Additional Action  Suggested:  >8%   . Ferritin 12/09/2014 14.9* 22.0 - 322.0 ng/mL Final  . Sodium 12/09/2014 136  135 - 145 mEq/L Final  . Potassium 12/09/2014 4.6  3.5 - 5.1 mEq/L Final  . Chloride 12/09/2014 105  96 - 112 mEq/L Final  . CO2 12/09/2014 25  19 - 32 mEq/L Final  . Glucose, Bld 12/09/2014 228* 70 - 99 mg/dL Final  . BUN 12/09/2014 22  6 - 23 mg/dL Final  . Creatinine, Ser 12/09/2014 1.13  0.40 - 1.50 mg/dL Final  . Calcium 12/09/2014 9.9  8.4 - 10.5 mg/dL Final  . GFR 12/09/2014 67.27  >60.00 mL/min Final  . Total Bilirubin 12/09/2014 0.5  0.2 - 1.2 mg/dL Final  . Bilirubin, Direct 12/09/2014 0.2  0.0 - 0.3 mg/dL Final  . Alkaline Phosphatase 12/09/2014 62  39 - 117 U/L Final  . AST 12/09/2014 27  0 - 37 U/L Final  . ALT 12/09/2014 27  0 - 53 U/L Final  . Total Protein 12/09/2014 8.1  6.0 - 8.3 g/dL Final  . Albumin 12/09/2014 3.5  3.5 - 5.2 g/dL Final  . Cholesterol 12/09/2014 190  0 - 200 mg/dL Final   ATP III Classification       Desirable:  < 200 mg/dL               Borderline High:  200 - 239 mg/dL          High:  > = 240 mg/dL  . Triglycerides 12/09/2014 541.0 Triglyceride is over 400; calculations on Lipids are invalid.* 0.0 - 149.0 mg/dL Final   Normal:  <150 mg/dLBorderline High:  150 - 199 mg/dL  . HDL 12/09/2014 22.80* >39.00 mg/dL Final  . Total CHOL/HDL Ratio 12/09/2014 8   Final                  Men          Women1/2 Average Risk     3.4          3.3Average Risk          5.0          4.42X Average Risk          9.6          7.13X Average Risk          15.0  11.0                      . Direct LDL 12/09/2014 63.0   Final   Optimal:  <100 mg/dLNear or Above Optimal:  100-129 mg/dLBorderline High:  130-159 mg/dLHigh:  160-189 mg/dLVery High:  >190 mg/dL    Assessment:  Zachary Buck is a 75 y.o. male with iron deficiency anemia and stage II multiple myeloma.    He has had mild anemia since 10/2013 and mild thrombocytopenia since 05/2014.  Colonoscopy in  10/2013 revealed polyps. EGD revealed gastritis.  Capsule study on 11/27/2014 was unremarkable per patient report. His diet is good.  Labs confirmed iron deficiency (ferritin 11.8 on 05/2014 and 13 on 10/09/2014).  He has been on oral iron three a day for 2 weeks.  B12 is low normal (MMA normal) thus ruling out B12 deficiency.  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 Serum immunoglobulins noted an IgG of 2930 (high).  24-hour urine revealed a 8.8% monoclonal protein (10.6 mg/24 hours). Bone survey on 10/20/2014 revealed no lytic lesions.  Albumen was 3.3 on 08/26/2014.  He had pneumonia in 05/2014.   Bone marrow biopsy on 11/10/2014 revealed a 10% monoclonal plasma cell infiltrate.  Marrow was hypercellular for age (40-50%) with mixed maturing hematopoiesis, relative erythroid hyperplasia and mild nonspecific dyserythropoiesis. There was patchy mild focally moderate increase in reticulin. There were no significant iron stores. Myeloma FISH panel revealed translocation (11;14) which results in fusion of CCND1 (BCL1) at 11q13 with the immunoglobulin heavy chain gene (IgH) at 14q32 (a favorable prognostic feature).  Beta2 microglobulin was 4.2 on 11/24/2014.  PET scan on 12/07/2014 revealed no hypermetabolic foci within the marrow space or nodal stations to suggest active multiple myeloma. There was  hypermetabolism within the right lower lobe, corresponding to a similar dependent density. Morphology favored scarring.  Given the hypermetabolism, consideration for follow-up chest CT documenting stability at 3-6 months was recommended. There was cirrhosis and portal venous hypertension, incompletely characterized. There was gastric body hypermetabolism favoring physiologic. There was pulmonary artery enlargement suggesting pulmonary artery hypertension. 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  Symptomatically, he feels  good. Exam is unremarkable.  Plan: 1. Discuss PET scan.  No evidence of bone lesions.  No end organ disease.  Limited marrow involvement.  Discuss stage II disease.  Discuss plans for ongoing observation every 3 months.  Discuss signs and symptoms of myeloma.   2. Discuss iron deficiency anemia.  Discuss GI work-up.  Discuss diet and continuation of oral iron.  Ferritin goal 100. 3. Consult/follow-up with Dr. Zenovia Jarred regarding cirrhosis, portal hypertension, and esophageal varices. 4. Anticipate follow-up chest CT approximately 06/07/2015. 5. RTC in 1 month for MD assessment, labs (CBC with diff, CMP, ferritin, SPEP).   Lequita Asal, MD  12/10/2014

## 2015-03-22 ENCOUNTER — Inpatient Hospital Stay: Payer: PPO

## 2015-03-22 ENCOUNTER — Other Ambulatory Visit: Payer: Self-pay

## 2015-03-22 ENCOUNTER — Inpatient Hospital Stay: Payer: PPO | Attending: Hematology and Oncology | Admitting: Hematology and Oncology

## 2015-03-22 VITALS — BP 162/74 | HR 47 | Temp 96.9°F | Resp 18 | Ht 69.0 in | Wt 184.5 lb

## 2015-03-22 DIAGNOSIS — E119 Type 2 diabetes mellitus without complications: Secondary | ICD-10-CM | POA: Diagnosis not present

## 2015-03-22 DIAGNOSIS — K7460 Unspecified cirrhosis of liver: Secondary | ICD-10-CM | POA: Diagnosis not present

## 2015-03-22 DIAGNOSIS — D123 Benign neoplasm of transverse colon: Secondary | ICD-10-CM

## 2015-03-22 DIAGNOSIS — Z7984 Long term (current) use of oral hypoglycemic drugs: Secondary | ICD-10-CM | POA: Insufficient documentation

## 2015-03-22 DIAGNOSIS — N400 Enlarged prostate without lower urinary tract symptoms: Secondary | ICD-10-CM | POA: Diagnosis not present

## 2015-03-22 DIAGNOSIS — R161 Splenomegaly, not elsewhere classified: Secondary | ICD-10-CM | POA: Diagnosis not present

## 2015-03-22 DIAGNOSIS — K2970 Gastritis, unspecified, without bleeding: Secondary | ICD-10-CM | POA: Insufficient documentation

## 2015-03-22 DIAGNOSIS — E7800 Pure hypercholesterolemia, unspecified: Secondary | ICD-10-CM | POA: Insufficient documentation

## 2015-03-22 DIAGNOSIS — I252 Old myocardial infarction: Secondary | ICD-10-CM | POA: Insufficient documentation

## 2015-03-22 DIAGNOSIS — Z79899 Other long term (current) drug therapy: Secondary | ICD-10-CM | POA: Diagnosis not present

## 2015-03-22 DIAGNOSIS — Z77090 Contact with and (suspected) exposure to asbestos: Secondary | ICD-10-CM | POA: Diagnosis not present

## 2015-03-22 DIAGNOSIS — I2510 Atherosclerotic heart disease of native coronary artery without angina pectoris: Secondary | ICD-10-CM | POA: Diagnosis not present

## 2015-03-22 DIAGNOSIS — Z86010 Personal history of colonic polyps: Secondary | ICD-10-CM | POA: Diagnosis not present

## 2015-03-22 DIAGNOSIS — K766 Portal hypertension: Secondary | ICD-10-CM

## 2015-03-22 DIAGNOSIS — I10 Essential (primary) hypertension: Secondary | ICD-10-CM | POA: Diagnosis not present

## 2015-03-22 DIAGNOSIS — D509 Iron deficiency anemia, unspecified: Secondary | ICD-10-CM | POA: Insufficient documentation

## 2015-03-22 DIAGNOSIS — C9000 Multiple myeloma not having achieved remission: Secondary | ICD-10-CM

## 2015-03-22 DIAGNOSIS — D696 Thrombocytopenia, unspecified: Secondary | ICD-10-CM | POA: Diagnosis not present

## 2015-03-22 DIAGNOSIS — R197 Diarrhea, unspecified: Secondary | ICD-10-CM

## 2015-03-22 LAB — COMPREHENSIVE METABOLIC PANEL
ALT: 33 U/L (ref 17–63)
AST: 30 U/L (ref 15–41)
Albumin: 3.4 g/dL — ABNORMAL LOW (ref 3.5–5.0)
Alkaline Phosphatase: 73 U/L (ref 38–126)
Anion gap: 4 — ABNORMAL LOW (ref 5–15)
BUN: 25 mg/dL — ABNORMAL HIGH (ref 6–20)
CO2: 24 mmol/L (ref 22–32)
Calcium: 9.4 mg/dL (ref 8.9–10.3)
Chloride: 105 mmol/L (ref 101–111)
Creatinine, Ser: 1.27 mg/dL — ABNORMAL HIGH (ref 0.61–1.24)
GFR calc Af Amer: >60 mL/min (ref >60)
GFR calc non Af Amer: 54 mL/min — ABNORMAL LOW (ref >60)
Glucose, Bld: 195 mg/dL — ABNORMAL HIGH (ref 65–99)
Potassium: 4.4 mmol/L (ref 3.5–5.1)
Sodium: 133 mmol/L — ABNORMAL LOW (ref 135–145)
Total Bilirubin: 0.8 mg/dL (ref 0.3–1.2)
Total Protein: 8.1 g/dL (ref 6.5–8.1)

## 2015-03-22 LAB — CBC WITH DIFFERENTIAL/PLATELET
Basophils Absolute: 0.1 10*3/uL (ref 0–0.1)
Basophils Relative: 1 %
Eosinophils Absolute: 0.5 10*3/uL (ref 0–0.7)
Eosinophils Relative: 7 %
HCT: 32.9 % — ABNORMAL LOW (ref 40.0–52.0)
Hemoglobin: 11.5 g/dL — ABNORMAL LOW (ref 13.0–18.0)
Lymphocytes Relative: 21 %
Lymphs Abs: 1.4 10*3/uL (ref 1.0–3.6)
MCH: 29.1 pg (ref 26.0–34.0)
MCHC: 34.9 g/dL (ref 32.0–36.0)
MCV: 83.6 fL (ref 80.0–100.0)
Monocytes Absolute: 0.8 10*3/uL (ref 0.2–1.0)
Monocytes Relative: 12 %
Neutro Abs: 4 10*3/uL (ref 1.4–6.5)
Neutrophils Relative %: 59 %
Platelets: 107 10*3/uL — ABNORMAL LOW (ref 150–440)
RBC: 3.94 MIL/uL — ABNORMAL LOW (ref 4.40–5.90)
RDW: 15.6 % — ABNORMAL HIGH (ref 11.5–14.5)
WBC: 6.8 10*3/uL (ref 3.8–10.6)

## 2015-03-22 LAB — FERRITIN: Ferritin: 20 ng/mL — ABNORMAL LOW (ref 24–336)

## 2015-03-22 NOTE — Telephone Encounter (Signed)
Left message for pt to call back  °

## 2015-03-22 NOTE — Progress Notes (Signed)
[facility] day: 03/22/2015  Chief Complaint: Zachary Buck is a 75 y.o. male  with stage II multiple myeloma and iron deficiency anemia who is seen for 2 month assessment.  HPI: The patient was last seen in the medical oncology clinic on 01/18/2015.  At that time, he was seen for 1 month assessment.  He denied any bone pain or infections.  Renal function was normal.  SPEP was stable at 2.3 gm/dL.  Symptomatically, he had abdominal pain and cramping, occasional nausea, and black stools.  He appeared to have pancreatic insufficiency as Creon was helping improve his symptoms. He was scheduled for upper and lower endoscopy on 03/04/2015.  Ferritin was 25.  He underwent colonoscopy on 03/04/2015. A sessile polyp was seen in the transverse colon. Colon biopsy revealed tubular adenoma.  He had random biopsies. EGD on 03/04/2015 revealed medium-sized esophageal varices in the mid and distal esophagus without bleeding. There was a sessile polyp in the gastric antrum for which he underwent polypectomy.   Pathology revealed a hyperplastic polyp.  Two hemoclips were placed.  There were 3 small angiodysplastic lesions in the second part of the duodenum. He underwent argon plasma coagulation.  It was recommended that he continue Protonix.  Nadolol was to be initiated for portal hypertension.  Because of ongoing abdominal pain, he underwent abdominal CT scan on 03/16/2015.  Images revealed splenomegaly (14.5 cm), cirrhosis, and stigmata of portal hypertension with esophageal and gastric varices.  There were no liver lesions.  He  stopped his Protonix secondary to pain in his stomach. He was told that the etiology of his cirrhosis was fatty liver. He stopped his pancreatic enzymes. He notes diarrhea for a long time (6 months to 2 years).  He is "getting used to it". He has been taking oral iron 3 times a day with vitamin C.  He notes a history of asbestos  exposure.  He was in the [employer] where "insulation was blown".  He was told about asbestos in his lungs in 2014 when scans were performed while investigating a kidney stone.  Past Medical History  Diagnosis Date  . Hypercholesterolemia   . Atherosclerotic heart disease     s/p MI, s/p stent mid circumflex  . Hypertension   . Diabetes mellitus without complication ([place])   . Clavicle fracture   . Leg fracture   . Myocardial infarct ([place])   . CAD (coronary artery disease)   . Prostate enlargement   . Internal hemorrhoids   . Anemia     Past Surgical History  Procedure Laterality Date  . Tonsillectomy    . Coronary angioplasty with stent placement    . Esophagogastroduodenoscopy (egd) with propofol N/A 03/04/2015    Procedure: ESOPHAGOGASTRODUODENOSCOPY (EGD) WITH PROPOFOL;  Surgeon: Jerene Bears, MD;  Location: [facility];  Service: Gastroenterology;  Laterality: N/A;  . Colonoscopy with propofol N/A 03/04/2015    Procedure: COLONOSCOPY WITH PROPOFOL;  Surgeon: Jerene Bears, MD;  Location: [facility];  Service: Gastroenterology;  Laterality: N/A;    Family History  Problem Relation Age of Onset  . Aneurysm Father 27    of the heart  . Diabetes Mother   . Heart attack Father     Social History:  reports that he has never smoked. He has never used smokeless tobacco. He reports that he does not drink alcohol or use illicit drugs.  Patient denies any exposure to radiation or toxins. The patient is accompanied  by his wife, [name], today.  Allergies:  Allergies  Allergen Reactions  . Niaspan [Niacin Er]     Current Medications: Current Outpatient Prescriptions  Medication Sig Dispense Refill  . blood glucose meter kit and supplies KIT Dispense based on patient and insurance preference. Use up to four times daily as directed. (FOR ICD-9 250.00, 250.01). 1 each 0  . Cholecalciferol (VITAMIN D-3) 1000 UNITS CAPS Take by mouth.    Lyndle Herrlich SULFATE PO Take 1 tablet by mouth 3  (three) times daily.    Marland Kitchen glipiZIDE (GLUCOTROL) 5 MG tablet Use 5 mg in the morning and 10 mg in the evening. (Patient taking differently: Take 5-10 mg by mouth 2 (two) times daily. take 5 mg in the morning and 10 mg in the evening) 270 tablet 1  . glucose blood (BAYER CONTOUR NEXT TEST) test strip Contour Next EZ test strips: check blood sugars twice a day. Dx: 250.00    . glucose blood (ONE TOUCH ULTRA TEST) test strip Use as instructed 100 each 12  . glycopyrrolate (ROBINUL) 2 MG tablet Take 1 tablet (2 mg total) by mouth 3 (three) times daily. 60 tablet 2  . Lancets (ONETOUCH ULTRASOFT) lancets Use as instructed 100 each 12  . lisinopril (PRINIVIL,ZESTRIL) 20 MG tablet TAKE ONE TABLET BY MOUTH EVERY DAY    . metFORMIN (GLUCOPHAGE) 1000 MG tablet Take 500-1,000 mg by mouth 3 (three) times daily. Take 1000 mg in the morning and at night; take 500 mg at lunch time    . metFORMIN (GLUCOPHAGE) 850 MG tablet Take 1 tablet (850 mg total) by mouth 3 (three) times daily with meals. 90 tablet 2  . Na Sulfate-K Sulfate-Mg Sulf SOLN Suprep-Use as directed 354 mL 0  . nadolol (CORGARD) 20 MG tablet Take 1 tablet (20 mg total) by mouth daily. 30 tablet 11  . Omega-3 Fatty Acids (FISH OIL) 1000 MG CAPS Take 1,000 mg by mouth daily.     Marland Kitchen OVER THE COUNTER MEDICATION Take 4 capsules by mouth 3 (three) times daily. curamed 750 mg capsules    . pantoprazole (PROTONIX) 40 MG tablet Take 1 tablet (40 mg total) by mouth daily. 3 tablet 3   No current facility-administered medications for this visit.    Review of Systems:  GENERAL:  Feels "ok".  No fevers or sweats.  Weight down 3 pounds. PERFORMANCE STATUS (ECOG):  1 HEENT:  No visual changes, runny nose, sore throat, mouth sores or tenderness. Lungs: No shortness of breath or cough.  No hemoptysis. Cardiac:  No chest pain, palpitations, orthopnea, or PND. GI:  Abdominal pain and cramping.  Chronic diarrhea.  Occasional black stools and nausea.  No vomiting,  constipation, or hematochezia. GU:  No urgency, frequency, dysuria, or hematuria. Musculoskeletal:  No back pain.  No joint pain.  No muscle tenderness. Extremities:  No pain or swelling. Skin:  No rashes or skin changes. Neuro:  No headache, numbness or weakness, balance or coordination issues. Endocrine:  Diabetes.  No tthyroid issues, hot flashes or night sweats. Psych:  No mood changes, depression or anxiety. Pain:  No focal pain. Review of systems:  All other systems reviewed and found to be negative.  Physical Exam: Blood pressure 162/74, pulse 47, temperature 96.9 F (36.1 C), temperature source Tympanic, resp. rate 18, height 5' 9"  (1.753 m), weight 184 lb 8.4 oz (83.7 kg). GENERAL:  Well developed, well nourished, sitting comfortably in the exam room in no acute distress. MENTAL STATUS:  Alert  and oriented to person, place and time. HEAD:  Pearline Cables hair.  Normocephalic, atraumatic, face symmetric, no Cushingoid features. EYES:  Blue eyes.  Pupils equal round and reactive to light and accomodation.  No conjunctivitis or scleral icterus. ENT:  Oropharynx clear without lesion.  Tongue normal. Mucous membranes moist.  RESPIRATORY:  Clear to auscultation without rales, wheezes or rhonchi. CARDIOVASCULAR:  Regular rate and rhythm without murmur, rub or gallop. ABDOMEN:  Soft, slightly tender without guarding or rebound tenderness.  Active bowel sounds and no appreciable hepatosplenomegaly.  No masses. SKIN:  No rashes, ulcers or lesions. EXTREMITIES: Lipoma left elbow.  No edema, no skin discoloration or tenderness.  No palpable cords. LYMPH NODES: No palpable cervical, supraclavicular, axillary or inguinal adenopathy  NEUROLOGICAL: Unremarkable. PSYCH:  Appropriate.  Appointment on 03/22/2015  Component Date Value Ref Range Status  . WBC 03/22/2015 6.8  3.8 - 10.6 K/uL Final  . RBC 03/22/2015 3.94* 4.40 - 5.90 MIL/uL Final  . Hemoglobin 03/22/2015 11.5* 13.0 - 18.0 g/dL Final  . HCT  03/22/2015 32.9* 40.0 - 52.0 % Final  . MCV 03/22/2015 83.6  80.0 - 100.0 fL Final  . MCH 03/22/2015 29.1  26.0 - 34.0 pg Final  . MCHC 03/22/2015 34.9  32.0 - 36.0 g/dL Final  . RDW 03/22/2015 15.6* 11.5 - 14.5 % Final  . Platelets 03/22/2015 107* 150 - 440 K/uL Final  . Neutrophils Relative % 03/22/2015 59   Final  . Neutro Abs 03/22/2015 4.0  1.4 - 6.5 K/uL Final  . Lymphocytes Relative 03/22/2015 21   Final  . Lymphs Abs 03/22/2015 1.4  1.0 - 3.6 K/uL Final  . Monocytes Relative 03/22/2015 12   Final  . Monocytes Absolute 03/22/2015 0.8  0.2 - 1.0 K/uL Final  . Eosinophils Relative 03/22/2015 7   Final  . Eosinophils Absolute 03/22/2015 0.5  0 - 0.7 K/uL Final  . Basophils Relative 03/22/2015 1   Final  . Basophils Absolute 03/22/2015 0.1  0 - 0.1 K/uL Final  . Sodium 03/22/2015 133* 135 - 145 mmol/L Final  . Potassium 03/22/2015 4.4  3.5 - 5.1 mmol/L Final  . Chloride 03/22/2015 105  101 - 111 mmol/L Final  . CO2 03/22/2015 24  22 - 32 mmol/L Final  . Glucose, Bld 03/22/2015 195* 65 - 99 mg/dL Final  . BUN 03/22/2015 25* 6 - 20 mg/dL Final  . Creatinine, Ser 03/22/2015 1.27* 0.61 - 1.24 mg/dL Final  . Calcium 03/22/2015 9.4  8.9 - 10.3 mg/dL Final  . Total Protein 03/22/2015 8.1  6.5 - 8.1 g/dL Final  . Albumin 03/22/2015 3.4* 3.5 - 5.0 g/dL Final  . AST 03/22/2015 30  15 - 41 U/L Final  . ALT 03/22/2015 33  17 - 63 U/L Final  . Alkaline Phosphatase 03/22/2015 73  38 - 126 U/L Final  . Total Bilirubin 03/22/2015 0.8  0.3 - 1.2 mg/dL Final  . GFR calc non Af Amer 03/22/2015 54* >60 mL/min Final  . GFR calc Af Amer 03/22/2015 >60  >60 mL/min Final   Comment: (NOTE) The eGFR has been calculated using the CKD EPI equation. This calculation has not been validated in all clinical situations. eGFR's persistently <60 mL/min signify possible Chronic Kidney Disease.   . Anion gap 03/22/2015 4* 5 - 15 Final  . Total Protein ELP 03/22/2015 7.6  6.0 - 8.5 g/dL Final  . Albumin ELP  03/22/2015 3.4  2.9 - 4.4 g/dL Final  . Alpha-1-Globulin 03/22/2015 0.2  0.0 -  0.4 g/dL Final  . Alpha-2-Globulin 03/22/2015 0.6  0.4 - 1.0 g/dL Final  . Beta Globulin 03/22/2015 0.9  0.7 - 1.3 g/dL Final  . Gamma Globulin 03/22/2015 2.5* 0.4 - 1.8 g/dL Final  . M-Spike, % 03/22/2015 2.3* Not Observed g/dL Final  . SPE Interp. 03/22/2015 Comment   Final   Comment: (NOTE) The SPE pattern demonstrates a single peak (M-spike) in the gamma region which may represent monoclonal protein. This peak may also be caused by circulating immune complexes, cryoglobulins, C-reactive protein, fibrinogen or hemolysis.  If clinically indicated, the presence of a monoclonal gammopathy may be confirmed by immuno- fixation, as well as an evaluation of the urine for the presence of Bence-Jones protein. Performed At: [facility] [place], [place] [id number] Lindon Romp MD TU:[id number]   . Comment 03/22/2015 Comment   Final   Comment: (NOTE) Protein electrophoresis scan will follow via computer, mail, or courier delivery.   Marland Kitchen GLOBULIN, TOTAL 03/22/2015 4.2* 2.2 - 3.9 g/dL Corrected  . A/G Ratio 03/22/2015 0.8  0.7 - 1.7 Corrected  . Ferritin 03/22/2015 20* 24 - 336 ng/mL Final    Assessment:  Zachary Buck is a 75 y.o. male with iron deficiency anemia and stage II multiple myeloma.   He has had mild anemia since 10/2013 and mild thrombocytopenia since 05/2014. Colonoscopy in 10/2013 revealed polyps. EGD revealed gastritis. Capsule enteroscopy on 10/7/.2016 revealed 1-2 gastric polyps and one colon polyp.  There were no findings to explain iron deficiency anemia.  His diet is good. Labs confirmed iron deficiency (ferritin 11.8 on 05/2014 and 13 on 10/09/2014). He has been on oral iron. B12 is low normal (MMA normal) thus ruling out B12 deficiency.  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 Serum immunoglobulins noted an IgG of 2930 (high). 24-hour urine revealed a 8.8% monoclonal protein (10.6 mg/24 hours). Bone survey on 10/20/2014 revealed no lytic lesions. Albumen was 3.3 on 08/26/2014. He had pneumonia in 05/2014.   SPEP was 2.3 g/dL on 10/09/2014, 01/18/2015, and 03/22/2015.  Kappa free light chains were 23.99, lambda free light chains 155.3, and a ratio of 0.15 (low) on 01/18/2015.  Bone marrow biopsy on 11/10/2014 revealed a 10% monoclonal plasma cell infiltrate. Marrow was hypercellular for age (40-50%) with mixed maturing hematopoiesis, relative erythroid hyperplasia and mild nonspecific dyserythropoiesis. There was patchy mild focally moderate increase in reticulin. There were no significant iron stores. Myeloma FISH panel revealed translocation (11;14) which results in fusion of CCND1 (BCL1) at 11q13 with the immunoglobulin heavy chain gene (IgH) at 14q32 (a favorable prognostic feature). Beta2 microglobulin was 4.2 on 11/24/2014.  PET scan on 12/07/2014 revealed no hypermetabolic foci within the marrow space or nodal stations to suggest active multiple myeloma. There was hypermetabolism within the right lower lobe, corresponding to a similar dependent density. Morphology favored scarring. Given the hypermetabolism, consideration for follow-up chest CT documenting stability at 3-6 months was recommended. There was cirrhosis and portal venous hypertension, incompletely characterized. There was gastric body hypermetabolism favoring physiologic. There was pulmonary artery enlargement suggesting pulmonary artery hypertension. 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  He has a history of asbestos exposure in the [employer].  Colonoscopy on 03/04/2015 revealed a sessile polyp was seen in the transverse colon (tubular adenoma). EGD on 03/04/2015 revealed medium-sized esophageal varices in the mid and distal esophagus without bleeding. There was a sessile polyp in  the gastric antrum (  hyperplastic polyp).  There were 3 small angiodysplastic lesions in the second part of the duodenum.  It was recommended that he continue Protonix.  Nadolol was to be initiated for portal hypertension.  He was told that the etiology of his cirrhosis was fatty liver.  Abdominal CT scan on 03/16/2015 revealed splenomegaly (14.5 cm), cirrhosis, and stigmata of portal hypertension with esophageal and gastric varices.  There were no liver lesions.  He  stopped his Protonix secondary to pain in his stomach. He stopped his pancreatic enzymes. He notes diarrhea for a long time (6 months to 2 years).  He has been taking oral iron 3 times a day with vitamin C.  Symptomatically, he has chronic diarrhea. Energy level is stable.  Hematocrit is decreasing on oral iron.  Ferritin is 20.  Plan: 1.  Labs today:  CBC with diff, CMP, SPEP, ferritin. 2.  Discuss interim GI evaluation, CT scan, and endoscopies. 3.  Discuss consideration of IV iron for declining hematocrit and persistent iron deficiency on oral iron.  Ferritin goal 100. 4.  Anticipate follow-up chest CT in 06/07/2015 to assess RLL hypermetabolism noted on PET scan. 5.  RTC in 3 months for MD assessment, labs (CBC with diff, CMP, SPEP, free light chains, ferritin), and +/- Venofer next day.  Addendum:  The patient will be scheduled for IV iron weekly x 3.  He will stop taking oral iron.   Lequita Asal, MD  03/22/2015, 8:53 AM

## 2015-03-23 ENCOUNTER — Ambulatory Visit: Payer: PPO | Admitting: Internal Medicine

## 2015-03-23 ENCOUNTER — Other Ambulatory Visit: Payer: Self-pay | Admitting: Hematology and Oncology

## 2015-03-23 ENCOUNTER — Inpatient Hospital Stay: Payer: PPO

## 2015-03-23 LAB — PROTEIN ELECTROPHORESIS, SERUM
A/G Ratio: 0.8 (ref 0.7–1.7)
Albumin ELP: 3.4 g/dL (ref 2.9–4.4)
Alpha-1-Globulin: 0.2 g/dL (ref 0.0–0.4)
Alpha-2-Globulin: 0.6 g/dL (ref 0.4–1.0)
Beta Globulin: 0.9 g/dL (ref 0.7–1.3)
Gamma Globulin: 2.5 g/dL — ABNORMAL HIGH (ref 0.4–1.8)
Globulin, Total: 4.2 g/dL — ABNORMAL HIGH (ref 2.2–3.9)
M-Spike, %: 2.3 g/dL — ABNORMAL HIGH
Total Protein ELP: 7.6 g/dL (ref 6.0–8.5)

## 2015-03-23 NOTE — Telephone Encounter (Signed)
Wife had multiple questions regarding if robinul interacts with nadolol, lisinopril, metformin, or glypizide. Discussed with pt that she should contact their pharmacist as we did not get alerts when the scripts were sent for the pt. Wife is aware that next step is referral to tertiary center.

## 2015-03-26 ENCOUNTER — Inpatient Hospital Stay: Payer: PPO | Attending: Hematology and Oncology

## 2015-03-26 VITALS — BP 148/73 | HR 47 | Temp 97.2°F | Resp 20

## 2015-03-26 DIAGNOSIS — D509 Iron deficiency anemia, unspecified: Secondary | ICD-10-CM | POA: Diagnosis not present

## 2015-03-26 DIAGNOSIS — C9000 Multiple myeloma not having achieved remission: Secondary | ICD-10-CM | POA: Diagnosis not present

## 2015-03-26 DIAGNOSIS — D696 Thrombocytopenia, unspecified: Secondary | ICD-10-CM | POA: Insufficient documentation

## 2015-03-26 DIAGNOSIS — R197 Diarrhea, unspecified: Secondary | ICD-10-CM | POA: Insufficient documentation

## 2015-03-26 DIAGNOSIS — Z79899 Other long term (current) drug therapy: Secondary | ICD-10-CM | POA: Diagnosis not present

## 2015-03-26 DIAGNOSIS — D123 Benign neoplasm of transverse colon: Secondary | ICD-10-CM | POA: Insufficient documentation

## 2015-03-26 MED ORDER — SODIUM CHLORIDE 0.9 % IV SOLN
Freq: Once | INTRAVENOUS | Status: AC
  Administered 2015-03-26: 09:00:00 via INTRAVENOUS
  Filled 2015-03-26: qty 1000

## 2015-03-26 MED ORDER — SODIUM CHLORIDE 0.9 % IV SOLN
200.0000 mg | Freq: Once | INTRAVENOUS | Status: AC
  Administered 2015-03-26: 200 mg via INTRAVENOUS
  Filled 2015-03-26: qty 10

## 2015-03-30 ENCOUNTER — Encounter: Payer: Self-pay | Admitting: Hematology and Oncology

## 2015-04-05 ENCOUNTER — Encounter: Payer: Self-pay | Admitting: Internal Medicine

## 2015-04-07 ENCOUNTER — Inpatient Hospital Stay: Payer: PPO

## 2015-04-07 ENCOUNTER — Other Ambulatory Visit: Payer: Self-pay | Admitting: Hematology and Oncology

## 2015-04-07 VITALS — BP 149/75 | HR 50 | Temp 97.4°F | Resp 18

## 2015-04-07 DIAGNOSIS — C9000 Multiple myeloma not having achieved remission: Secondary | ICD-10-CM | POA: Diagnosis not present

## 2015-04-07 DIAGNOSIS — D509 Iron deficiency anemia, unspecified: Secondary | ICD-10-CM

## 2015-04-07 MED ORDER — SODIUM CHLORIDE 0.9 % IV SOLN
200.0000 mg | Freq: Once | INTRAVENOUS | Status: AC
  Administered 2015-04-07: 200 mg via INTRAVENOUS
  Filled 2015-04-07: qty 10

## 2015-04-07 MED ORDER — SODIUM CHLORIDE 0.9 % IV SOLN
Freq: Once | INTRAVENOUS | Status: AC
  Administered 2015-04-07: 09:00:00 via INTRAVENOUS
  Filled 2015-04-07: qty 1000

## 2015-04-12 ENCOUNTER — Encounter: Payer: Self-pay | Admitting: Internal Medicine

## 2015-04-14 ENCOUNTER — Inpatient Hospital Stay: Payer: PPO

## 2015-04-14 ENCOUNTER — Other Ambulatory Visit: Payer: Self-pay | Admitting: Hematology and Oncology

## 2015-04-14 ENCOUNTER — Telehealth: Payer: Self-pay | Admitting: *Deleted

## 2015-04-14 ENCOUNTER — Other Ambulatory Visit: Payer: Self-pay | Admitting: *Deleted

## 2015-04-14 ENCOUNTER — Ambulatory Visit: Payer: PPO | Admitting: Internal Medicine

## 2015-04-14 ENCOUNTER — Encounter: Payer: Self-pay | Admitting: Internal Medicine

## 2015-04-14 VITALS — BP 140/67 | HR 60 | Temp 96.1°F | Resp 20

## 2015-04-14 DIAGNOSIS — C9000 Multiple myeloma not having achieved remission: Secondary | ICD-10-CM | POA: Diagnosis not present

## 2015-04-14 DIAGNOSIS — D509 Iron deficiency anemia, unspecified: Secondary | ICD-10-CM

## 2015-04-14 MED ORDER — METFORMIN HCL 850 MG PO TABS: 850.0000 mg | ORAL_TABLET | Freq: Three times a day (TID) | ORAL | Status: DC

## 2015-04-14 MED ORDER — HEPARIN SOD (PORK) LOCK FLUSH 100 UNIT/ML IV SOLN: 500.0000 [IU] | Freq: Once | INTRAVENOUS | Status: AC | PRN

## 2015-04-14 MED ORDER — GLIPIZIDE 5 MG PO TABS: ORAL_TABLET | ORAL | Status: DC

## 2015-04-14 MED ORDER — ALTEPLASE 2 MG IJ SOLR: 2.0000 mg | Freq: Once | INTRAMUSCULAR | Status: AC | PRN

## 2015-04-14 MED ORDER — IRON SUCROSE 20 MG/ML IV SOLN
200.0000 mg | Freq: Once | INTRAVENOUS | Status: AC
  Administered 2015-04-14: 200 mg via INTRAVENOUS
  Filled 2015-04-14: qty 10

## 2015-04-14 MED ORDER — SODIUM CHLORIDE 0.9 % IJ SOLN
10.0000 mL | INTRAMUSCULAR | Status: AC | PRN
Start: 2015-04-14 — End: ?
  Filled 2015-04-14: qty 10

## 2015-04-14 MED ORDER — METFORMIN HCL 1000 MG PO TABS: 1000.0000 mg | ORAL_TABLET | Freq: Two times a day (BID) | ORAL | Status: DC

## 2015-04-14 MED ORDER — SODIUM CHLORIDE 0.9 % IJ SOLN
3.0000 mL | Freq: Once | INTRAMUSCULAR | Status: AC | PRN
  Filled 2015-04-14: qty 10

## 2015-04-14 MED ORDER — HEPARIN SOD (PORK) LOCK FLUSH 100 UNIT/ML IV SOLN: 250.0000 [IU] | Freq: Once | INTRAVENOUS | Status: AC | PRN

## 2015-04-14 MED ORDER — SODIUM CHLORIDE 0.9 % IV SOLN
Freq: Once | INTRAVENOUS | Status: AC
  Administered 2015-04-14: 09:00:00 via INTRAVENOUS
  Filled 2015-04-14: qty 1000

## 2015-04-14 NOTE — Telephone Encounter (Signed)
Refilled per request.

## 2015-04-14 NOTE — Telephone Encounter (Signed)
Requested a medication refill for metformin, and glipizide Pharmacy [facility] on [address].

## 2015-04-14 NOTE — Telephone Encounter (Signed)
Ok to refill metformin and glipizide as he is taking.  Please correct on medication list.  Ok to refill x 3 months.  Thanks

## 2015-04-20 ENCOUNTER — Encounter: Payer: Self-pay | Admitting: *Deleted

## 2015-04-27 ENCOUNTER — Ambulatory Visit: Payer: PPO | Admitting: Internal Medicine

## 2015-04-29 ENCOUNTER — Other Ambulatory Visit: Payer: PPO

## 2015-04-29 ENCOUNTER — Other Ambulatory Visit (INDEPENDENT_AMBULATORY_CARE_PROVIDER_SITE_OTHER): Payer: PPO

## 2015-04-29 ENCOUNTER — Telehealth: Payer: Self-pay | Admitting: *Deleted

## 2015-04-29 ENCOUNTER — Other Ambulatory Visit: Payer: Self-pay | Admitting: Internal Medicine

## 2015-04-29 DIAGNOSIS — E119 Type 2 diabetes mellitus without complications: Secondary | ICD-10-CM

## 2015-04-29 DIAGNOSIS — I10 Essential (primary) hypertension: Secondary | ICD-10-CM

## 2015-04-29 DIAGNOSIS — I2510 Atherosclerotic heart disease of native coronary artery without angina pectoris: Secondary | ICD-10-CM

## 2015-04-29 DIAGNOSIS — C9000 Multiple myeloma not having achieved remission: Secondary | ICD-10-CM

## 2015-04-29 LAB — LIPID PANEL
Cholesterol: 163 mg/dL (ref 0–200)
HDL: 22 mg/dL — ABNORMAL LOW (ref >39.00)
NONHDL: 141.32
Total CHOL/HDL Ratio: 7
Triglycerides: 358 mg/dL — ABNORMAL HIGH (ref 0.0–149.0)
VLDL: 71.6 mg/dL — AB (ref 0.0–40.0)

## 2015-04-29 LAB — MICROALBUMIN / CREATININE URINE RATIO
CREATININE, U: 185.8 mg/dL
MICROALB UR: 22.4 mg/dL — AB (ref 0.0–1.9)
MICROALB/CREAT RATIO: 12.1 mg/g (ref 0.0–30.0)

## 2015-04-29 LAB — HEMOGLOBIN A1C: Hgb A1c MFr Bld: 6.9 % — ABNORMAL HIGH (ref 4.6–6.5)

## 2015-04-29 LAB — BASIC METABOLIC PANEL
BUN: 20 mg/dL (ref 6–23)
CO2: 26 mEq/L (ref 19–32)
Calcium: 9.4 mg/dL (ref 8.4–10.5)
Chloride: 106 mEq/L (ref 96–112)
Creatinine, Ser: 1.19 mg/dL (ref 0.40–1.50)
GFR: 63.31 mL/min (ref >60.00)
GLUCOSE: 201 mg/dL — AB (ref 70–99)
POTASSIUM: 4.7 meq/L (ref 3.5–5.1)
Sodium: 138 mEq/L (ref 135–145)

## 2015-04-29 LAB — TSH: TSH: 2.73 u[IU]/mL (ref 0.35–4.50)

## 2015-04-29 LAB — LDL CHOLESTEROL, DIRECT: LDL DIRECT: 46 mg/dL

## 2015-04-29 NOTE — Progress Notes (Signed)
Orders placed for labs

## 2015-04-29 NOTE — Addendum Note (Signed)
Addended by: Karlene Einstein D on: 04/29/2015 02:48 PM   Modules accepted: Orders

## 2015-04-29 NOTE — Telephone Encounter (Signed)
Labs and dx?  

## 2015-04-29 NOTE — Telephone Encounter (Signed)
Just put in this am.  Should be in.  Thanks

## 2015-04-30 ENCOUNTER — Ambulatory Visit (INDEPENDENT_AMBULATORY_CARE_PROVIDER_SITE_OTHER): Payer: PPO | Admitting: Internal Medicine

## 2015-04-30 ENCOUNTER — Encounter: Payer: Self-pay | Admitting: Internal Medicine

## 2015-04-30 ENCOUNTER — Ambulatory Visit: Payer: PPO | Admitting: Internal Medicine

## 2015-04-30 VITALS — BP 140/70 | HR 62 | Ht 69.0 in | Wt 181.1 lb

## 2015-04-30 DIAGNOSIS — K317 Polyp of stomach and duodenum: Secondary | ICD-10-CM

## 2015-04-30 DIAGNOSIS — K7469 Other cirrhosis of liver: Secondary | ICD-10-CM

## 2015-04-30 DIAGNOSIS — K529 Noninfective gastroenteritis and colitis, unspecified: Secondary | ICD-10-CM

## 2015-04-30 DIAGNOSIS — K766 Portal hypertension: Secondary | ICD-10-CM | POA: Diagnosis not present

## 2015-04-30 DIAGNOSIS — I8510 Secondary esophageal varices without bleeding: Secondary | ICD-10-CM | POA: Diagnosis not present

## 2015-04-30 DIAGNOSIS — D509 Iron deficiency anemia, unspecified: Secondary | ICD-10-CM

## 2015-04-30 NOTE — Patient Instructions (Signed)
You may begin taking Imodium - 2mg  every morning, and extra dose if needed.  Continue your Nadolol  Please follow up in 4 months

## 2015-04-30 NOTE — Progress Notes (Signed)
Subjective:    Patient ID: Zachary Buck, male    DOB: [birth year], 75 y.o.   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 He is here with his wife. Last seen in the office in November 2016 but came for upper endoscopy and colonoscopy in January 2017 performed for iron deficiency anemia, cirrhosis by imaging, gastric polyp by capsule and abdominal pain with explosive diarrhea.  EGD revealed medium-sized esophageal varices without stigmata, a 9 mm sessile polyp in the gastric antrum removed by snare cautery. 3 small angiodysplastic lesions in the second portion of the duodenum ablated by APC.  Small bowel biopsies were normal. The polyp was a gastric hyperplastic polyp with reactive epithelial changes. Negative for dysplasia.  Colonoscopy revealed a normal terminal ileum. A 6 mm sessile transverse colon polyp was removed by cold snare. An colonic mucosa appeared otherwise normal with random biopsies performed. Pathology revealed an adenomatous colon polyp without high-grade dysplasia. Random biopsies were normal with no evidence of microscopic colitis.  After these studies on 03/16/2015 a CT scan was repeated of the abdomen and pelvis for ongoing episodic abdominal pain and explosive diarrhea. This showed cirrhosis of the liver with stigmata of portal venous hypertension including splenomegaly and esophageal and gastric varices. Aortic atherosclerosis. No evidence of HCC.  Today he reports his biggest issue continues to be near daily diarrhea but also intermittent more explosive urgent stools which can be difficult control and at times uncontrollable. He's had no melena or blood in his stool. Both he  and his wife feel that his upper epigastric stomach pains have improved after endoscopy and "diminished a lot". Still on occasion he will have days of diffuse abdominal discomfort which will force him to lie down. His appetite has remained good. He has started nadolol initially 20 mg prescribed by me at the time of variceal diagnosis. His wife read about beta blockers and had multiple questions. She has reduced his dose to three quarters of a tablet daily or 15 mg. At 20 mg his resting pulse was in the upper 40s. He denies dizziness or falling.  He is getting IV iron infusions through Dr. Mike Gip.  He does use occasional sildenafil or EGD.  He is now off PPI.  No benefit with Robinul Forte or trial of Zenpep   Review of Systems As per history of present illness, otherwise negative  Current Medications, Allergies, Past Medical History, Past Surgical History, Family History and Social History were reviewed in [employer] record.     Objective:   Physical Exam BP 140/70 mmHg  Pulse 62  Ht 5' 9"  (1.753 m)  Wt 181 lb 2 oz (82.158 kg)  BMI 26.74 kg/m2 Constitutional: Well-developed and well-nourished. No distress. HEENT: Normocephalic and atraumatic. Oropharynx is clear and moist. No oropharyngeal exudate. Conjunctivae are normal.  No scleral icterus. Neck: Neck supple. Trachea midline. Cardiovascular: Normal rate, regular rhythm and intact distal pulses. No M/R/G Pulmonary/chest: Effort normal and breath sounds normal. No wheezing, rales or rhonchi. Abdominal: Soft, diffuse mild tenderness without rebound or guarding, nondistended. Bowel sounds active throughout. There are no masses palpable.  Extremities: no clubbing, cyanosis, or edema Neurological: Alert and oriented to person place and time. Skin: Skin is warm and dry.  No rashes noted. Psychiatric: Normal mood and affect. Behavior is normal.  CBC    Component Value Date/Time   WBC 6.8 03/22/2015 0842    WBC 6.0 11/18/2013 1053   RBC 3.94* 03/22/2015 0842   RBC 4.24* 10/09/2014 1056   RBC 4.80 11/18/2013 1053   HGB 11.5* 03/22/2015 0842   HGB 13.1 11/18/2013 1053   HCT 32.9* 03/22/2015 0842   HCT 40.4 11/18/2013 1053   PLT 107* 03/22/2015 0842   PLT 152 11/18/2013 1053   MCV 83.6 03/22/2015 0842   MCV 84 11/18/2013 1053   MCH 29.1 03/22/2015 0842   MCH 27.3 11/18/2013 1053   MCHC 34.9 03/22/2015 0842   MCHC 32.5 11/18/2013 1053   RDW 15.6* 03/22/2015 0842   RDW 14.9* 11/18/2013 1053   LYMPHSABS 1.4 03/22/2015 0842   MONOABS 0.8 03/22/2015 0842   EOSABS 0.5 03/22/2015 0842   BASOSABS 0.1 03/22/2015 0842    CMP     Component Value Date/Time   NA 138 04/29/2015 0940   K 4.7 04/29/2015 0940   CL 106 04/29/2015 0940   CO2 26 04/29/2015 0940   GLUCOSE 201* 04/29/2015 0940   BUN 20 04/29/2015 0940   CREATININE 1.19 04/29/2015 0940   CREATININE 1.43* 08/30/2012 1615   CALCIUM 9.4 04/29/2015 0940   PROT 8.1 03/22/2015 0842   PROT 8.3* 11/18/2013 1053   ALBUMIN 3.4* 03/22/2015 0842   ALBUMIN 3.3* 11/18/2013 1053   AST 30 03/22/2015 0842   AST 29 11/18/2013 1053   ALT 33 03/22/2015 0842   ALT 36 11/18/2013 1053   ALKPHOS 73 03/22/2015 0842   ALKPHOS 58 11/18/2013 1053   BILITOT 0.8 03/22/2015 0842   BILITOT 0.6 11/18/2013 1053   GFRNONAA 54* 03/22/2015 0842   GFRNONAA 49* 08/30/2012 1615   GFRAA >60 03/22/2015 0842   GFRAA 56* 08/30/2012 1615    Iron/TIBC/Ferritin/ %Sat    Component Value Date/Time   IRON 46 10/09/2014 1056   TIBC 370 10/09/2014 1056   FERRITIN 20* 03/22/2015 0842   IRONPCTSAT 12* 10/09/2014 1056   Lab Results  Component Value Date   INR 1.11 03/04/2015   INR 0.99 11/10/2014   INR 1.08 11/02/2014      Assessment & Plan:  75 year old male with a past medical history of 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  1. Cryptogenic cirrhosis with portal hypertension/esophageal varices -- overall cirrhosis remains clinically well compensated at this time. He has not been troubled with lower extremity edema or ascites. No evidence of hepatic encephalopathy. We had a long discussion regarding nadolol and I have stressed the extreme importance of this medication going forward. Resting heart rate currently 62 so will continue with the 15 mg dose. [place] screening up-to-date. Vaccinations up-to-date. --esophageal varices -- on beta blocker --HCC screening -- recent CT abdomen and pelvis, given cross-sectional nature of screening, repeat at 12 months --Hepatic encephalopathy -- not currently a problem --Low sodium diet --Vaccination up-to-date  2. Chronic diarrhea -- unclear etiology after extensive testing. Trial of Imodium 2 mg each morning. He can take additional dose if diarrhea occurs throughout the day. If this does not produce adequate response or improvement, consider Lomotil. If Lomotil does not work consider Viberzi.  3. Upper abdominal pain -- recently improved. Likely not related to gastric polypectomy, though one cannot be sure.  Monitor going forward   4. IDA  -- IV iron as necessary. GI evaluation complete. Status post polypectomy of known gastric polyp, ablation of visible angiodysplastic lesions.   5. History of colon polyps -- surveillance colonoscopy interval would be 5 years depending on overall health   Four-month follow-up, sooner if necessary  45 minutes spent with the patient today. Greater than 50% was spent in counseling and coordination of care with the patient

## 2015-05-03 ENCOUNTER — Encounter: Payer: Self-pay | Admitting: Internal Medicine

## 2015-05-03 ENCOUNTER — Ambulatory Visit (INDEPENDENT_AMBULATORY_CARE_PROVIDER_SITE_OTHER): Payer: PPO | Admitting: Internal Medicine

## 2015-05-03 VITALS — BP 120/80 | HR 65 | Temp 99.1°F | Resp 18 | Ht 69.0 in | Wt 180.0 lb

## 2015-05-03 DIAGNOSIS — I8510 Secondary esophageal varices without bleeding: Secondary | ICD-10-CM

## 2015-05-03 DIAGNOSIS — C9000 Multiple myeloma not having achieved remission: Secondary | ICD-10-CM

## 2015-05-03 DIAGNOSIS — D509 Iron deficiency anemia, unspecified: Secondary | ICD-10-CM

## 2015-05-03 DIAGNOSIS — D696 Thrombocytopenia, unspecified: Secondary | ICD-10-CM

## 2015-05-03 DIAGNOSIS — E119 Type 2 diabetes mellitus without complications: Secondary | ICD-10-CM | POA: Diagnosis not present

## 2015-05-03 DIAGNOSIS — I2510 Atherosclerotic heart disease of native coronary artery without angina pectoris: Secondary | ICD-10-CM

## 2015-05-03 DIAGNOSIS — E7800 Pure hypercholesterolemia, unspecified: Secondary | ICD-10-CM

## 2015-05-03 DIAGNOSIS — I10 Essential (primary) hypertension: Secondary | ICD-10-CM

## 2015-05-03 DIAGNOSIS — K766 Portal hypertension: Secondary | ICD-10-CM

## 2015-05-03 LAB — GLUCOSE, POCT (MANUAL RESULT ENTRY): POC GLUCOSE: 136 mg/dL — AB (ref 70–99)

## 2015-05-03 NOTE — Progress Notes (Signed)
Pre-visit discussion using our clinic review tool. No additional management support is needed unless otherwise documented below in the visit note.  

## 2015-05-03 NOTE — Progress Notes (Signed)
Patient ID: Zachary Buck, male   DOB: Dec 24, 1940, 75 y.o.   MRN: [id number]   Subjective:    Patient ID: Zachary Buck, male    DOB: May 31, 1940, 75 y.o.   MRN: [id number]  HPI  Patient with past history of hypercholesterolemia, diabetes, ASHD and hypertension.  Recently diagnosed with cirrhosis and portal hypertension.  Also found to have iron deficiency anemia and multiple myeloma.  He is followed by hematology and GI.  See their last notes for details.  Receiving iron infusions.  On nadolol.  Energy previously improved with last iron infusion.  Feels fatigued now.  Request to have cbc checked today.  Is planning to f/u with hematology soon.  Sugars have been doing better.  Recent a1c 6.9.  Has adjusted his diet.  Lost weight.  Takes immodium for his bowels.     Past Medical History  Diagnosis Date  . Hypercholesterolemia   . Atherosclerotic heart disease     s/p MI, s/p stent mid circumflex  . Hypertension   . Diabetes mellitus without complication ([place])   . Clavicle fracture   . Leg fracture   . Myocardial infarct ([place])   . CAD (coronary artery disease)   . Prostate enlargement   . Internal hemorrhoids   . Anemia   . Tubular adenoma of colon   . Esophageal varices ([place])   . Cirrhosis [facility])    Past Surgical History  Procedure Laterality Date  . Tonsillectomy    . Coronary angioplasty with stent placement    . Esophagogastroduodenoscopy (egd) with propofol N/A 03/04/2015    Procedure: ESOPHAGOGASTRODUODENOSCOPY (EGD) WITH PROPOFOL;  Surgeon: Jerene Bears, MD;  Location: [facility];  Service: Gastroenterology;  Laterality: N/A;  . Colonoscopy with propofol N/A 03/04/2015    Procedure: COLONOSCOPY WITH PROPOFOL;  Surgeon: Jerene Bears, MD;  Location: [facility];  Service: Gastroenterology;  Laterality: N/A;   Family History  Problem Relation Age of Onset  . Aneurysm Father 31    of the heart  . Diabetes Mother   . Heart attack Father    Social History    Social History  . Marital Status: Married    Spouse Name: N/A  . Number of Children: 2  . Years of Education: N/A   Occupational History  . Preacher    Social History Main Topics  . Smoking status: Never Smoker   . Smokeless tobacco: Never Used  . Alcohol Use: No  . Drug Use: No  . Sexual Activity: Not Asked   Other Topics Concern  . None   Social History Narrative    Outpatient Encounter Prescriptions as of 05/03/2015  Medication Sig  . Ascorbic Acid (VITAMIN C) 1000 MG tablet Take 1,000 mg by mouth daily.  . B Complex Vitamins (VITAMIN B COMPLEX) TABS Take by mouth daily.  . blood glucose meter kit and supplies KIT Dispense based on patient and insurance preference. Use up to four times daily as directed. (FOR ICD-9 250.00, 250.01).  . Cholecalciferol (VITAMIN D-3) 1000 UNITS CAPS Take by mouth.  Lyndle Herrlich SULFATE PO Take 1 tablet by mouth 3 (three) times daily.  Marland Kitchen glipiZIDE (GLUCOTROL) 5 MG tablet Use 5 mg in the morning and 10 mg in the evening.  Marland Kitchen glucose blood (BAYER CONTOUR NEXT TEST) test strip Contour Next EZ test strips: check blood sugars twice a day. Dx: 250.00  . glucose blood (ONE TOUCH ULTRA TEST) test strip Use as instructed  . Lancets (ONETOUCH ULTRASOFT) lancets Use  as instructed  . lisinopril (PRINIVIL,ZESTRIL) 20 MG tablet TAKE ONE TABLET BY MOUTH EVERY DAY  . Magnesium 500 MG CAPS Take 500 mg by mouth daily.  . metFORMIN (GLUCOPHAGE) 1000 MG tablet Take 1 tablet (1,000 mg total) by mouth 2 (two) times daily with a meal.  . nadolol (CORGARD) 20 MG tablet Take 1 tablet (20 mg total) by mouth daily.  . Omega-3 Fatty Acids (FISH OIL) 1000 MG CAPS Take 1,000 mg by mouth daily.   Marland Kitchen OVER THE COUNTER MEDICATION Take 4 capsules by mouth 3 (three) times daily. curamed 750 mg capsules  . sildenafil (VIAGRA) 25 MG tablet Take 25 mg by mouth as needed for erectile dysfunction.  . Turmeric POWD 4,000 mg by Does not apply route daily.  . vitamin B-12 (CYANOCOBALAMIN)  100 MCG tablet Take 200 mcg by mouth daily.   Facility-Administered Encounter Medications as of 05/03/2015  Medication  . alteplase (CATHFLO ACTIVASE) injection 2 mg  . heparin lock flush 100 unit/mL  . heparin lock flush 100 unit/mL  . sodium chloride 0.9 % injection 10 mL  . sodium chloride 0.9 % injection 3 mL    Review of Systems  Constitutional: Positive for fatigue.       Adjusted diet.    HENT: Negative for congestion and sinus pressure.   Respiratory: Negative for cough, chest tightness and shortness of breath.   Cardiovascular: Negative for chest pain, palpitations and leg swelling.  Gastrointestinal: Positive for diarrhea. Negative for nausea, vomiting and abdominal pain.  Genitourinary: Negative for dysuria and difficulty urinating.  Musculoskeletal: Negative for back pain and joint swelling.  Skin: Negative for color change and rash.  Neurological: Negative for dizziness, light-headedness and headaches.  Psychiatric/Behavioral: Negative for dysphoric mood and agitation.       Objective:    Physical Exam  Constitutional: He appears well-developed and well-nourished. No distress.  HENT:  Nose: Nose normal.  Mouth/Throat: Oropharynx is clear and moist.  Neck: Neck supple. No thyromegaly present.  Cardiovascular: Normal rate and regular rhythm.   Pulmonary/Chest: Effort normal and breath sounds normal. No respiratory distress.  Abdominal: Soft. Bowel sounds are normal. There is no tenderness.  Musculoskeletal: He exhibits no edema or tenderness.  Increased soft tissue - elbows.   Lymphadenopathy:    He has no cervical adenopathy.  Skin: No rash noted. No erythema.  Psychiatric: He has a normal mood and affect. His behavior is normal.    BP 120/80 mmHg  Pulse 65  Temp(Src) 99.1 F (37.3 C) (Oral)  Resp 18  Ht _0  (1.753 m)  Wt 180 lb (81.647 kg)  BMI 26.57 kg/m2  SpO2 96% Wt Readings from Last 3 Encounters:  05/03/15 180 lb (81.647 kg)  04/30/15 181 lb  2 oz (82.158 kg)  03/22/15 184 lb 8.4 oz (83.7 kg)     Lab Results  Component Value Date   WBC 3.5* 05/03/2015   HGB 10.7* 05/03/2015   HCT 32.0* 05/03/2015   PLT 126.0* 05/03/2015   GLUCOSE 201* 04/29/2015   CHOL 163 04/29/2015   TRIG 358.0* 04/29/2015   HDL 22.00* 04/29/2015   LDLDIRECT 46.0 04/29/2015   LDLCALC 51 05/14/2013   ALT 33 03/22/2015   AST 30 03/22/2015   NA 138 04/29/2015   K 4.7 04/29/2015   CL 106 04/29/2015   CREATININE 1.19 04/29/2015   BUN 20 04/29/2015   CO2 26 04/29/2015   TSH 2.73 04/29/2015   INR 1.11 03/04/2015   HGBA1C 6.9* 04/29/2015  MICROALBUR 22.4* 04/29/2015    Ct Abd Wo & W Cm  03/16/2015  CLINICAL DATA:  Abdominal pain. Recent study indicating cirrhosis. Anchor this counting is a of some ear of varicosity ablation J EXAM: CT ABDOMEN WITHOUT AND WITH CONTRAST TECHNIQUE: Multidetector CT imaging of the abdomen was performed following the standard protocol before and following the bolus administration of intravenous contrast. CONTRAST:  122m OMNIPAQUE IOHEXOL 300 MG/ML  SOLN COMPARISON:  None. FINDINGS: Lower chest: Calcifications along the pleura of the right lung base noted. No pleural fluid identified. Hepatobiliary: The liver has a nodular contour. There is hypertrophy of the caudate lobe and lateral segment. 6 mm low-attenuation structure along the dome of liver is too small to characterize, image 13/series 5. No enhancing liver abnormalities identified. The gallbladder appears normal. No intrahepatic bile duct dilatation. Pancreas: Normal appearance of the pancreas. Spleen: The spleen measures 14.5 cm in length. Adrenals/Urinary Tract: Normal appearance of the adrenal glands. Small cyst within the upper pole of left kidney measures 1.2 cm. Normal appearance of the right kidney. On the delayed images there is symmetric excretion of contrast material by both kidneys. Stomach/Bowel: The stomach is normal. No pathologic dilatation of the upper  abdominal bowel loops. Vascular/Lymphatic: Aortic atherosclerosis identified. The portal vein is patent. There is recanalization of the umbilical vein. Esophageal and gastric varices are identified. No enlarged retroperitoneal or mesenteric adenopathy. No enlarged pelvic or inguinal lymph nodes. Other: No significant free fluid or fluid collections identified. Musculoskeletal: Spondylosis noted within the lower thoracic spine. IMPRESSION: 1. Cirrhosis of the liver. 2. Stigmata of portal venous hypertension including splenomegaly and esophageal and gastric varices. 3. Aortic atherosclerosis. 4. No hyper 80 G enhancing liver lesions identified to suggest hepatoma. Electronically Signed   By: TKerby MoorsM.D.   On: 03/16/2015 16:06       Assessment & Plan:   Problem List Items Addressed This Visit    CAD (coronary artery disease)    Stable.  Continue risk factor modification.        Diabetes ([place]    Sugar has improved.  He has adjusted his diet.  Lost weight.  Follow sugars.  No low sugars.        Essential hypertension, benign    Blood pressure under good control.  Continue same medication regimen.  Follow pressures.  Follow metabolic panel.        Hypercholesterolemia    Low cholesterol diet and exercise.  Follow lipid panel.  Last LDL 46.  Triglycerides improved - 358.        Relevant Orders   Lipid panel   Hepatic function panel   Iron deficiency anemia - Primary    Receiving IV infusions.  Wants cbc checked today.        Relevant Orders   CBC with Differential/Platelet (Completed)   Ferritin (Completed)   Multiple myeloma ([place]    Followed by hematology.        Portal hypertension ([facility])    On nadolol.  Followed by GI.        Secondary esophageal varices without bleeding ([facility])    On nadolol.  Followed by GI.        Thrombocytopenia ([place]    Followed by hematology.         Other Visit Diagnoses    Diabetes mellitus without complication ([place]        Relevant  Orders    POCT Glucose (CBG) (Completed)    Hemoglobin A1c  Basic metabolic panel        Einar Pheasant, MD

## 2015-05-04 LAB — FERRITIN: Ferritin: 81 ng/mL (ref 22.0–322.0)

## 2015-05-04 LAB — CBC WITH DIFFERENTIAL/PLATELET
BASOS ABS: 0 10*3/uL (ref 0.0–0.1)
BASOS PCT: 0.6 % (ref 0.0–3.0)
EOS ABS: 0.3 10*3/uL (ref 0.0–0.7)
EOS PCT: 9.3 % — AB (ref 0.0–5.0)
HEMATOCRIT: 32 % — AB (ref 39.0–52.0)
Hemoglobin: 10.7 g/dL — ABNORMAL LOW (ref 13.0–17.0)
LYMPHS ABS: 1.8 10*3/uL (ref 0.7–4.0)
Lymphocytes Relative: 50.3 % — ABNORMAL HIGH (ref 12.0–46.0)
MCHC: 33.5 g/dL (ref 30.0–36.0)
MCV: 85.3 fl (ref 78.0–100.0)
MONO ABS: 1.3 10*3/uL — AB (ref 0.1–1.0)
Monocytes Relative: 37.1 % — ABNORMAL HIGH (ref 3.0–12.0)
NEUTROS ABS: 0.1 10*3/uL — AB (ref 1.4–7.7)
NEUTROS PCT: 2.7 % — AB (ref 43.0–77.0)
PLATELETS: 126 10*3/uL — AB (ref 150.0–400.0)
RBC: 3.75 Mil/uL — ABNORMAL LOW (ref 4.22–5.81)
RDW: 16.8 % — ABNORMAL HIGH (ref 11.5–15.5)
WBC: 3.5 10*3/uL — ABNORMAL LOW (ref 4.0–10.5)

## 2015-05-05 ENCOUNTER — Encounter: Payer: Self-pay | Admitting: Internal Medicine

## 2015-05-05 ENCOUNTER — Encounter: Payer: Self-pay | Admitting: *Deleted

## 2015-05-07 ENCOUNTER — Encounter: Payer: Self-pay | Admitting: Internal Medicine

## 2015-05-07 NOTE — Telephone Encounter (Signed)
The historical medication is Kenalog 0.1%. Please advise a refill for this medication?

## 2015-05-07 NOTE — Telephone Encounter (Signed)
Please clarify what he needs the cream for.  Thanks

## 2015-05-08 ENCOUNTER — Encounter: Payer: Self-pay | Admitting: Internal Medicine

## 2015-05-10 ENCOUNTER — Encounter: Payer: Self-pay | Admitting: Internal Medicine

## 2015-05-10 NOTE — Assessment & Plan Note (Signed)
Receiving IV infusions.  Wants cbc checked today.

## 2015-05-10 NOTE — Assessment & Plan Note (Signed)
Followed by hematology 

## 2015-05-10 NOTE — Assessment & Plan Note (Signed)
On nadolol.  Followed by GI.

## 2015-05-10 NOTE — Assessment & Plan Note (Signed)
Low cholesterol diet and exercise.  Follow lipid panel.  Last LDL 46.  Triglycerides improved - 358.

## 2015-05-10 NOTE — Assessment & Plan Note (Signed)
Stable. Continue risk factor modification.  

## 2015-05-10 NOTE — Assessment & Plan Note (Signed)
Blood pressure under good control.  Continue same medication regimen.  Follow pressures.  Follow metabolic panel.   

## 2015-05-10 NOTE — Assessment & Plan Note (Signed)
Sugar has improved.  He has adjusted his diet.  Lost weight.  Follow sugars.  No low sugars.

## 2015-05-12 NOTE — Telephone Encounter (Signed)
Unread mychart message mailed to patient 

## 2015-05-17 ENCOUNTER — Ambulatory Visit: Payer: PPO | Admitting: Internal Medicine

## 2015-05-19 ENCOUNTER — Telehealth: Payer: Self-pay | Admitting: *Deleted

## 2015-05-19 DIAGNOSIS — D709 Neutropenia, unspecified: Secondary | ICD-10-CM

## 2015-05-19 DIAGNOSIS — C9000 Multiple myeloma not having achieved remission: Secondary | ICD-10-CM

## 2015-05-19 NOTE — Telephone Encounter (Signed)
Wife called back and left message that pt had no chills, no feverm no infection. No trouble urinating.  I called her back and she said that one of his doctors had put him on nadolol to get heart rate down to goal about 60.  That is the only change she has saw.  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 Hoarseness with sermons but that is not new.  He is a little tired she said.  He went to [place] last weekend which in 5 hours there and 4 1/2 back on same day.  Helped his son mow several acres of grass.  She was sick about a couple of weeks ago and had a cold but she slept in a different bed and tried to keep her distance from him until she was well. I told her that based on what she is telling me he just needs to come in and get CBC drawn to check his counts.  After that we will review numbers and let her know.  If any other the things above is a concern for Corcoran then I will call her back in am after speaking to [place] and give her other instructions.  If she does not hear from me he should just come in between 10-10:30 for cbc. She was agreeable to plan and I can hear pt in background stating ok with him

## 2015-05-19 NOTE — Telephone Encounter (Signed)
DrCorcoran had asked me to call because she was sent fax with labs from [facility] and cbc the wbc and anc was low and wanted to see if pt had infection fever, urinary problems,cough, cold anything that he just felt bad.  Wanted to know if his labs had been checked since the 3/15 date. Asked him to call me back and left my work phone #

## 2015-05-19 NOTE — Addendum Note (Signed)
Addended by: Luella Cook on: 05/19/2015 09:39 PM   Modules accepted: Orders

## 2015-05-20 ENCOUNTER — Inpatient Hospital Stay: Payer: PPO | Attending: Hematology and Oncology

## 2015-05-20 DIAGNOSIS — Z79899 Other long term (current) drug therapy: Secondary | ICD-10-CM | POA: Insufficient documentation

## 2015-05-20 DIAGNOSIS — D709 Neutropenia, unspecified: Secondary | ICD-10-CM

## 2015-05-20 DIAGNOSIS — D509 Iron deficiency anemia, unspecified: Secondary | ICD-10-CM | POA: Diagnosis not present

## 2015-05-20 DIAGNOSIS — D696 Thrombocytopenia, unspecified: Secondary | ICD-10-CM | POA: Insufficient documentation

## 2015-05-20 DIAGNOSIS — C9000 Multiple myeloma not having achieved remission: Secondary | ICD-10-CM | POA: Insufficient documentation

## 2015-05-20 DIAGNOSIS — R197 Diarrhea, unspecified: Secondary | ICD-10-CM | POA: Insufficient documentation

## 2015-05-20 DIAGNOSIS — D123 Benign neoplasm of transverse colon: Secondary | ICD-10-CM | POA: Insufficient documentation

## 2015-05-20 LAB — CBC WITH DIFFERENTIAL/PLATELET
Basophils Absolute: 0.1 10*3/uL (ref 0–0.1)
Basophils Relative: 1 %
Eosinophils Absolute: 0.5 10*3/uL (ref 0–0.7)
Eosinophils Relative: 7 %
HCT: 33.1 % — ABNORMAL LOW (ref 40.0–52.0)
Hemoglobin: 11.2 g/dL — ABNORMAL LOW (ref 13.0–18.0)
Lymphocytes Relative: 21 %
Lymphs Abs: 1.4 10*3/uL (ref 1.0–3.6)
MCH: 28.7 pg (ref 26.0–34.0)
MCHC: 34 g/dL (ref 32.0–36.0)
MCV: 84.6 fL (ref 80.0–100.0)
Monocytes Absolute: 0.6 10*3/uL (ref 0.2–1.0)
Monocytes Relative: 10 %
Neutro Abs: 4 10*3/uL (ref 1.4–6.5)
Neutrophils Relative %: 61 %
Platelets: 115 10*3/uL — ABNORMAL LOW (ref 150–440)
RBC: 3.91 MIL/uL — ABNORMAL LOW (ref 4.40–5.90)
RDW: 17.7 % — ABNORMAL HIGH (ref 11.5–14.5)
WBC: 6.6 10*3/uL (ref 3.8–10.6)

## 2015-05-21 ENCOUNTER — Telehealth: Payer: Self-pay | Admitting: *Deleted

## 2015-05-21 NOTE — Telephone Encounter (Signed)
Pt contacted and let him know that the labs done yest. Were good. hgb in the 11 range which is about his normal for him.  His plt is in 115 which is about his normal for him.  The wbc and ANC back to normal and in general the labs are good.

## 2015-05-24 MED ORDER — TRIAMCINOLONE ACETONIDE 0.1 % EX CREA: TOPICAL_CREAM | Freq: Two times a day (BID) | CUTANEOUS | Status: DC

## 2015-05-24 NOTE — Telephone Encounter (Signed)
ok'd rx for TCC cream.

## 2015-06-10 ENCOUNTER — Telehealth: Payer: Self-pay | Admitting: Internal Medicine

## 2015-06-10 NOTE — Telephone Encounter (Signed)
Left message for pt to call back.  Pts wife states that she thinks since he has been taking nadolol his memory is not as good. 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 Also concerned because his short term memory is not as good. Asked wife if she had talked to his PCP because they can do a memory assessment and/or refer to neurology. Wife thinks again that nadolol may be causing this and wants to know if there is something else he could take instead of the nadolol. Please advise.

## 2015-06-11 MED ORDER — PROPRANOLOL HCL 20 MG PO TABS: 20.0000 mg | ORAL_TABLET | Freq: Two times a day (BID) | ORAL | Status: DC

## 2015-06-11 NOTE — Telephone Encounter (Signed)
Beta blocker is strongly recommended given known esophageal varices in this patient with portal hypertension from cirrhosis Propranolol 20 mg twice daily could be substituted for nadolol if they want to try a different beta blocker

## 2015-06-11 NOTE — Telephone Encounter (Signed)
Script sent to pharmacy. Pts wife aware.

## 2015-06-11 NOTE — Telephone Encounter (Signed)
Wants to go ahead and try the Propranolol 20mg . Will you please send to pharmacy?

## 2015-06-11 NOTE — Telephone Encounter (Signed)
Pts wife aware. States she is going to speak with her husband and call back to let us know if they want to change anything.

## 2015-06-17 ENCOUNTER — Other Ambulatory Visit: Payer: Self-pay | Admitting: *Deleted

## 2015-06-17 DIAGNOSIS — D509 Iron deficiency anemia, unspecified: Secondary | ICD-10-CM

## 2015-06-17 DIAGNOSIS — C9000 Multiple myeloma not having achieved remission: Secondary | ICD-10-CM

## 2015-06-18 ENCOUNTER — Inpatient Hospital Stay (HOSPITAL_BASED_OUTPATIENT_CLINIC_OR_DEPARTMENT_OTHER): Payer: PPO | Admitting: Hematology and Oncology

## 2015-06-18 ENCOUNTER — Telehealth: Payer: Self-pay | Admitting: *Deleted

## 2015-06-18 ENCOUNTER — Inpatient Hospital Stay: Payer: PPO | Attending: Hematology and Oncology

## 2015-06-18 ENCOUNTER — Inpatient Hospital Stay: Payer: PPO | Admitting: Hematology and Oncology

## 2015-06-18 ENCOUNTER — Other Ambulatory Visit: Payer: Self-pay | Admitting: Hematology and Oncology

## 2015-06-18 ENCOUNTER — Inpatient Hospital Stay: Payer: PPO

## 2015-06-18 VITALS — BP 153/75 | HR 58 | Temp 97.8°F | Wt 181.7 lb

## 2015-06-18 DIAGNOSIS — E7800 Pure hypercholesterolemia, unspecified: Secondary | ICD-10-CM | POA: Diagnosis not present

## 2015-06-18 DIAGNOSIS — D696 Thrombocytopenia, unspecified: Secondary | ICD-10-CM

## 2015-06-18 DIAGNOSIS — I2510 Atherosclerotic heart disease of native coronary artery without angina pectoris: Secondary | ICD-10-CM | POA: Diagnosis not present

## 2015-06-18 DIAGNOSIS — I252 Old myocardial infarction: Secondary | ICD-10-CM | POA: Insufficient documentation

## 2015-06-18 DIAGNOSIS — N400 Enlarged prostate without lower urinary tract symptoms: Secondary | ICD-10-CM | POA: Diagnosis not present

## 2015-06-18 DIAGNOSIS — R197 Diarrhea, unspecified: Secondary | ICD-10-CM | POA: Insufficient documentation

## 2015-06-18 DIAGNOSIS — C9000 Multiple myeloma not having achieved remission: Secondary | ICD-10-CM | POA: Insufficient documentation

## 2015-06-18 DIAGNOSIS — Z79899 Other long term (current) drug therapy: Secondary | ICD-10-CM | POA: Diagnosis not present

## 2015-06-18 DIAGNOSIS — D509 Iron deficiency anemia, unspecified: Secondary | ICD-10-CM | POA: Diagnosis not present

## 2015-06-18 DIAGNOSIS — E119 Type 2 diabetes mellitus without complications: Secondary | ICD-10-CM | POA: Diagnosis not present

## 2015-06-18 DIAGNOSIS — Z7984 Long term (current) use of oral hypoglycemic drugs: Secondary | ICD-10-CM | POA: Diagnosis not present

## 2015-06-18 DIAGNOSIS — K760 Fatty (change of) liver, not elsewhere classified: Secondary | ICD-10-CM | POA: Insufficient documentation

## 2015-06-18 DIAGNOSIS — R942 Abnormal results of pulmonary function studies: Secondary | ICD-10-CM | POA: Insufficient documentation

## 2015-06-18 DIAGNOSIS — Z86010 Personal history of colonic polyps: Secondary | ICD-10-CM | POA: Insufficient documentation

## 2015-06-18 DIAGNOSIS — E139 Other specified diabetes mellitus without complications: Secondary | ICD-10-CM | POA: Insufficient documentation

## 2015-06-18 DIAGNOSIS — R161 Splenomegaly, not elsewhere classified: Secondary | ICD-10-CM | POA: Insufficient documentation

## 2015-06-18 DIAGNOSIS — K7460 Unspecified cirrhosis of liver: Secondary | ICD-10-CM | POA: Insufficient documentation

## 2015-06-18 DIAGNOSIS — K766 Portal hypertension: Secondary | ICD-10-CM | POA: Insufficient documentation

## 2015-06-18 DIAGNOSIS — D6959 Other secondary thrombocytopenia: Secondary | ICD-10-CM | POA: Insufficient documentation

## 2015-06-18 DIAGNOSIS — Z955 Presence of coronary angioplasty implant and graft: Secondary | ICD-10-CM | POA: Insufficient documentation

## 2015-06-18 DIAGNOSIS — Z77090 Contact with and (suspected) exposure to asbestos: Secondary | ICD-10-CM | POA: Insufficient documentation

## 2015-06-18 LAB — COMPREHENSIVE METABOLIC PANEL
ALT: 31 U/L (ref 17–63)
AST: 31 U/L (ref 15–41)
Albumin: 3.3 g/dL — ABNORMAL LOW (ref 3.5–5.0)
Alkaline Phosphatase: 70 U/L (ref 38–126)
Anion gap: 4 — ABNORMAL LOW (ref 5–15)
BUN: 25 mg/dL — ABNORMAL HIGH (ref 6–20)
CO2: 24 mmol/L (ref 22–32)
Calcium: 9.6 mg/dL (ref 8.9–10.3)
Chloride: 109 mmol/L (ref 101–111)
Creatinine, Ser: 1.15 mg/dL (ref 0.61–1.24)
GFR calc Af Amer: >60 mL/min (ref >60)
GFR calc non Af Amer: >60 mL/min (ref >60)
Glucose, Bld: 246 mg/dL — ABNORMAL HIGH (ref 65–99)
Potassium: 4.8 mmol/L (ref 3.5–5.1)
Sodium: 137 mmol/L (ref 135–145)
Total Bilirubin: 0.9 mg/dL (ref 0.3–1.2)
Total Protein: 7.9 g/dL (ref 6.5–8.1)

## 2015-06-18 LAB — CBC WITH DIFFERENTIAL/PLATELET
Basophils Absolute: 0.1 10*3/uL (ref 0–0.1)
Basophils Relative: 1 %
Eosinophils Absolute: 0.4 10*3/uL (ref 0–0.7)
Eosinophils Relative: 6 %
HCT: 31.4 % — ABNORMAL LOW (ref 40.0–52.0)
Hemoglobin: 10.8 g/dL — ABNORMAL LOW (ref 13.0–18.0)
Lymphocytes Relative: 16 %
Lymphs Abs: 1.3 10*3/uL (ref 1.0–3.6)
MCH: 29.3 pg (ref 26.0–34.0)
MCHC: 34.3 g/dL (ref 32.0–36.0)
MCV: 85.2 fL (ref 80.0–100.0)
Monocytes Absolute: 0.8 10*3/uL (ref 0.2–1.0)
Monocytes Relative: 10 %
Neutro Abs: 5.3 10*3/uL (ref 1.4–6.5)
Neutrophils Relative %: 67 %
Platelets: 99 10*3/uL — ABNORMAL LOW (ref 150–440)
RBC: 3.68 MIL/uL — ABNORMAL LOW (ref 4.40–5.90)
RDW: 17.3 % — ABNORMAL HIGH (ref 11.5–14.5)
WBC: 7.9 10*3/uL (ref 3.8–10.6)

## 2015-06-18 LAB — FERRITIN: Ferritin: 23 ng/mL — ABNORMAL LOW (ref 24–336)

## 2015-06-18 NOTE — Telephone Encounter (Signed)
Called the pt and got voicemail to let him know that his ferritin is low and he will get venofer on next Friday.  Pt has appt to see md on tues. After ct scan.

## 2015-06-18 NOTE — Progress Notes (Signed)
[facility] day: 06/18/2015   Chief Complaint: Zachary Buck is a 75 y.o. male  with stage II multiple myeloma and iron deficiency anemia who is seen for 2 month assessment.  HPI: The patient was last seen in the medical oncology clinic on 03/22/2015.  At that time,   he was seen for 1 month assessment.  He denied any bone pain or infections.  Renal function was normal.  SPEP was stable at 2.3 gm/dL.  Symptomatically, he had abdominal pain and cramping, occasional nausea, and black stools.  He appeared to have pancreatic insufficiency as Creon was helping improve his symptoms. He was scheduled for upper and lower endoscopy on 03/04/2015.  Ferritin was 25.  He underwent colonoscopy on 03/04/2015. A sessile polyp was seen in the transverse colon. Colon biopsy revealed tubular adenoma.  He had random biopsies. EGD on 03/04/2015 revealed medium-sized esophageal varices in the mid and distal esophagus without bleeding. There was a sessile polyp in the gastric antrum for which he underwent polypectomy.   Pathology revealed a hyperplastic polyp.  Two hemoclips were placed.  There were 3 small angiodysplastic lesions in the second part of the duodenum. He underwent argon plasma coagulation.  It was recommended that he continue Protonix.  Nadolol was to be initiated for portal hypertension.  Because of ongoing abdominal pain, he underwent abdominal CT scan on 03/16/2015.  Images revealed splenomegaly (14.5 cm), cirrhosis, and stigmata of portal hypertension with esophageal and gastric varices.  There were no liver lesions.  He  stopped his Protonix secondary to pain in his stomach. He was told that the etiology of his cirrhosis was fatty liver. He stopped his pancreatic enzymes. He notes diarrhea for a long time (6 months to 2 years).  He is "getting used to it". He has been taking oral iron 3 times a day with vitamin C.  He notes a history of asbestos  exposure.  He was in the [employer] where "insulation was blown".  He was told about asbestos in his lungs in 2014 when scans were performed while investigating a kidney stone.  Past Medical History  Diagnosis Date  . Hypercholesterolemia   . Atherosclerotic heart disease     s/p MI, s/p stent mid circumflex  . Hypertension   . Diabetes mellitus without complication ([place])   . Clavicle fracture   . Leg fracture   . Myocardial infarct ([place])   . CAD (coronary artery disease)   . Prostate enlargement   . Internal hemorrhoids   . Anemia   . Tubular adenoma of colon   . Esophageal varices ([place])   . Cirrhosis [facility])     Past Surgical History  Procedure Laterality Date  . Tonsillectomy    . Coronary angioplasty with stent placement    . Esophagogastroduodenoscopy (egd) with propofol N/A 03/04/2015    Procedure: ESOPHAGOGASTRODUODENOSCOPY (EGD) WITH PROPOFOL;  Surgeon: Jerene Bears, MD;  Location: [facility];  Service: Gastroenterology;  Laterality: N/A;  . Colonoscopy with propofol N/A 03/04/2015    Procedure: COLONOSCOPY WITH PROPOFOL;  Surgeon: Jerene Bears, MD;  Location: [facility];  Service: Gastroenterology;  Laterality: N/A;    Family History  Problem Relation Age of Onset  . Aneurysm Father 38    of the heart  . Diabetes Mother   . Heart attack Father     Social History:  reports that he has never smoked. He has never used smokeless tobacco. He reports that he  does not drink alcohol or use illicit drugs.  Patient denies any exposure to radiation or toxins. The patient is accompanied by his wife, [name], today.  Allergies:  Allergies  Allergen Reactions  . Niaspan [Niacin Er]     Current Medications: Current Outpatient Prescriptions  Medication Sig Dispense Refill  . Ascorbic Acid (VITAMIN C) 1000 MG tablet Take 1,000 mg by mouth daily.    . B Complex Vitamins (VITAMIN B COMPLEX) TABS Take by mouth daily.    . blood glucose meter kit and supplies KIT Dispense based on  patient and insurance preference. Use up to four times daily as directed. (FOR ICD-9 250.00, 250.01). 1 each 0  . Cholecalciferol (VITAMIN D-3) 1000 UNITS CAPS Take by mouth.    Lyndle Herrlich SULFATE PO Take 1 tablet by mouth 3 (three) times daily.    Marland Kitchen glipiZIDE (GLUCOTROL) 5 MG tablet Use 5 mg in the morning and 10 mg in the evening. 270 tablet 3  . glucose blood (BAYER CONTOUR NEXT TEST) test strip Contour Next EZ test strips: check blood sugars twice a day. Dx: 250.00    . glucose blood (ONE TOUCH ULTRA TEST) test strip Use as instructed 100 each 12  . Lancets (ONETOUCH ULTRASOFT) lancets Use as instructed 100 each 12  . lisinopril (PRINIVIL,ZESTRIL) 20 MG tablet TAKE ONE TABLET BY MOUTH EVERY DAY    . Magnesium 500 MG CAPS Take 500 mg by mouth daily.    . metFORMIN (GLUCOPHAGE) 1000 MG tablet Take 1 tablet (1,000 mg total) by mouth 2 (two) times daily with a meal. 60 tablet 3  . nadolol (CORGARD) 20 MG tablet Take 1 tablet (20 mg total) by mouth daily. 30 tablet 11  . Omega-3 Fatty Acids (FISH OIL PO) Take by mouth.    . Omega-3 Fatty Acids (FISH OIL) 1000 MG CAPS Take 1,000 mg by mouth daily.     Marland Kitchen OVER THE COUNTER MEDICATION Take 4 capsules by mouth 3 (three) times daily. curamed 750 mg capsules    . propranolol (INDERAL) 20 MG tablet Take 1 tablet (20 mg total) by mouth 2 (two) times daily. 60 tablet 3  . sildenafil (VIAGRA) 25 MG tablet Take 25 mg by mouth as needed for erectile dysfunction.    . triamcinolone cream (KENALOG) 0.1 % Apply topically 2 (two) times daily. Please avoid face and genitalia area.  Do not use in the same spot for more than 7-10 days in a row. 30 g 0  . Turmeric POWD 4,000 mg by Does not apply route daily.    . vitamin B-12 (CYANOCOBALAMIN) 100 MCG tablet Take 200 mcg by mouth daily.     No current facility-administered medications for this visit.   Facility-Administered Medications Ordered in Other Visits  Medication Dose Route Frequency Provider Last Rate Last  Dose  . alteplase (CATHFLO ACTIVASE) injection 2 mg  2 mg Intracatheter Once PRN Lequita Asal, MD      . heparin lock flush 100 unit/mL  500 Units Intracatheter Once PRN Lequita Asal, MD      . heparin lock flush 100 unit/mL  250 Units Intracatheter Once PRN Lequita Asal, MD      . sodium chloride 0.9 % injection 10 mL  10 mL Intracatheter PRN Lequita Asal, MD      . sodium chloride 0.9 % injection 3 mL  3 mL Intravenous Once PRN Lequita Asal, MD        Review of Systems:  GENERAL:  Feels "ok".  No fevers or sweats.  Weight down 3 pounds. PERFORMANCE STATUS (ECOG):  1 HEENT:  No visual changes, runny nose, sore throat, mouth sores or tenderness. Lungs: No shortness of breath or cough.  No hemoptysis. Cardiac:  No chest pain, palpitations, orthopnea, or PND. GI:  Abdominal pain and cramping.  Chronic diarrhea.  Occasional black stools and nausea.  No vomiting, constipation, or hematochezia. GU:  No urgency, frequency, dysuria, or hematuria. Musculoskeletal:  No back pain.  No joint pain.  No muscle tenderness. Extremities:  No pain or swelling. Skin:  No rashes or skin changes. Neuro:  No headache, numbness or weakness, balance or coordination issues. Endocrine:  Diabetes.  No tthyroid issues, hot flashes or night sweats. Psych:  No mood changes, depression or anxiety. Pain:  No focal pain. Review of systems:  All other systems reviewed and found to be negative.  Physical Exam: Blood pressure 153/75, pulse 58, temperature 97.8 F (36.6 C), weight 181 lb 10.5 oz (82.4 kg). GENERAL:  Well developed, well nourished, sitting comfortably in the exam room in no acute distress. MENTAL STATUS:  Alert and oriented to person, place and time. HEAD:  Pearline Cables hair.  Normocephalic, atraumatic, face symmetric, no Cushingoid features. EYES:  Blue eyes.  Pupils equal round and reactive to light and accomodation.  No conjunctivitis or scleral icterus. ENT:  Oropharynx clear  without lesion.  Tongue normal. Mucous membranes moist.  RESPIRATORY:  Clear to auscultation without rales, wheezes or rhonchi. CARDIOVASCULAR:  Regular rate and rhythm without murmur, rub or gallop. ABDOMEN:  Soft, slightly tender without guarding or rebound tenderness.  Active bowel sounds and no appreciable hepatosplenomegaly.  No masses. SKIN:  No rashes, ulcers or lesions. EXTREMITIES: Lipoma left elbow.  No edema, no skin discoloration or tenderness.  No palpable cords. LYMPH NODES: No palpable cervical, supraclavicular, axillary or inguinal adenopathy  NEUROLOGICAL: Unremarkable. PSYCH:  Appropriate.  Appointment on 06/18/2015  Component Date Value Ref Range Status  . WBC 06/18/2015 7.9  3.8 - 10.6 K/uL Final  . RBC 06/18/2015 3.68* 4.40 - 5.90 MIL/uL Final  . Hemoglobin 06/18/2015 10.8* 13.0 - 18.0 g/dL Final  . HCT 06/18/2015 31.4* 40.0 - 52.0 % Final  . MCV 06/18/2015 85.2  80.0 - 100.0 fL Final  . MCH 06/18/2015 29.3  26.0 - 34.0 pg Final  . MCHC 06/18/2015 34.3  32.0 - 36.0 g/dL Final  . RDW 06/18/2015 17.3* 11.5 - 14.5 % Final  . Platelets 06/18/2015 99* 150 - 440 K/uL Final  . Neutrophils Relative % 06/18/2015 67   Final  . Neutro Abs 06/18/2015 5.3  1.4 - 6.5 K/uL Final  . Lymphocytes Relative 06/18/2015 16   Final  . Lymphs Abs 06/18/2015 1.3  1.0 - 3.6 K/uL Final  . Monocytes Relative 06/18/2015 10   Final  . Monocytes Absolute 06/18/2015 0.8  0.2 - 1.0 K/uL Final  . Eosinophils Relative 06/18/2015 6   Final  . Eosinophils Absolute 06/18/2015 0.4  0 - 0.7 K/uL Final  . Basophils Relative 06/18/2015 1   Final  . Basophils Absolute 06/18/2015 0.1  0 - 0.1 K/uL Final  . Sodium 06/18/2015 137  135 - 145 mmol/L Final  . Potassium 06/18/2015 4.8  3.5 - 5.1 mmol/L Final  . Chloride 06/18/2015 109  101 - 111 mmol/L Final  . CO2 06/18/2015 24  22 - 32 mmol/L Final  . Glucose, Bld 06/18/2015 246* 65 - 99 mg/dL Final  . BUN 06/18/2015 25*  6 - 20 mg/dL Final  . Creatinine,  Ser 06/18/2015 1.15  0.61 - 1.24 mg/dL Final  . Calcium 06/18/2015 9.6  8.9 - 10.3 mg/dL Final  . Total Protein 06/18/2015 7.9  6.5 - 8.1 g/dL Final  . Albumin 06/18/2015 3.3* 3.5 - 5.0 g/dL Final  . AST 06/18/2015 31  15 - 41 U/L Final  . ALT 06/18/2015 31  17 - 63 U/L Final  . Alkaline Phosphatase 06/18/2015 70  38 - 126 U/L Final  . Total Bilirubin 06/18/2015 0.9  0.3 - 1.2 mg/dL Final  . GFR calc non Af Amer 06/18/2015 >60  >60 mL/min Final  . GFR calc Af Amer 06/18/2015 >60  >60 mL/min Final   Comment: (NOTE) The eGFR has been calculated using the CKD EPI equation. This calculation has not been validated in all clinical situations. eGFR's persistently <60 mL/min signify possible Chronic Kidney Disease.   Georgiann Hahn gap 06/18/2015 4* 5 - 15 Final    Assessment:  Zachary Buck is a 75 y.o. male with iron deficiency anemia and stage II multiple myeloma.   He has had mild anemia since 10/2013 and mild thrombocytopenia since 05/2014. Colonoscopy in 10/2013 revealed polyps. EGD revealed gastritis. Capsule enteroscopy on 10/7/.2016 revealed 1-2 gastric polyps and one colon polyp.  There were no findings to explain iron deficiency anemia.  His diet is good. Labs confirmed iron deficiency (ferritin 11.8 on 05/2014 and 13 on 10/09/2014). He has been on oral iron. B12 is low normal (MMA normal) thus ruling out B12 deficiency.  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 Serum immunoglobulins noted an IgG of 2930 (high). 24-hour urine revealed a 8.8% monoclonal protein (10.6 mg/24 hours). Bone survey on 10/20/2014 revealed no lytic lesions. Albumen was 3.3 on 08/26/2014. He had pneumonia in 05/2014.   SPEP was 2.3 g/dL on 10/09/2014, 01/18/2015, and 03/22/2015.  Kappa free light chains were 23.99, lambda free light chains 155.3, and a ratio of 0.15 (low) on 01/18/2015.  Bone marrow biopsy on  11/10/2014 revealed a 10% monoclonal plasma cell infiltrate. Marrow was hypercellular for age (40-50%) with mixed maturing hematopoiesis, relative erythroid hyperplasia and mild nonspecific dyserythropoiesis. There was patchy mild focally moderate increase in reticulin. There were no significant iron stores. Myeloma FISH panel revealed translocation (11;14) which results in fusion of CCND1 (BCL1) at 11q13 with the immunoglobulin heavy chain gene (IgH) at 14q32 (a favorable prognostic feature). Beta2 microglobulin was 4.2 on 11/24/2014.  PET scan on 12/07/2014 revealed no hypermetabolic foci within the marrow space or nodal stations to suggest active multiple myeloma. There was hypermetabolism within the right lower lobe, corresponding to a similar dependent density. Morphology favored scarring. Given the hypermetabolism, consideration for follow-up chest CT documenting stability at 3-6 months was recommended. There was cirrhosis and portal venous hypertension, incompletely characterized. There was gastric body hypermetabolism favoring physiologic. There was pulmonary artery enlargement suggesting pulmonary artery hypertension. 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  He has a history of asbestos exposure in the [employer].  Colonoscopy on 03/04/2015 revealed a sessile polyp was seen in the transverse colon (tubular adenoma). EGD on 03/04/2015 revealed medium-sized esophageal varices in the mid and distal esophagus without bleeding. There was a sessile polyp in the gastric antrum (hyperplastic polyp).  There were 3 small angiodysplastic lesions in the second part of the duodenum.  It was recommended that he continue Protonix.  Nadolol was to be initiated for  portal hypertension.  He was told that the etiology of his cirrhosis was fatty liver.  Abdominal CT scan on 03/16/2015 revealed splenomegaly (14.5 cm), cirrhosis, and stigmata of portal hypertension with esophageal and gastric varices.  There were no  liver lesions.  He stopped his Protonix secondary to pain in his stomach. He stopped his pancreatic enzymes. He notes diarrhea for a long time (6 months to 2 years).  He has been taking oral iron 3 times a day with vitamin C.  Symptomatically, he has chronic diarrhea. Energy level is stable.  Hematocrit is decreasing on oral iron.  Ferritin is 20.  Plan: 1.  Labs today:  CBC with diff, CMP, SPEP, ferritin. 2.  Discuss interim GI evaluation, CT scan, and endoscopies. 3.  Discuss consideration of IV iron for declining hematocrit and persistent iron deficiency on oral iron.  Ferritin goal 100. 4.  Anticipate follow-up chest CT in 06/07/2015 to assess RLL hypermetabolism noted on PET scan. 5.  RTC in 3 months for MD assessment, labs (CBC with diff, CMP, SPEP, free light chains, ferritin), and +/- Venofer next day.    Lequita Asal, MD  06/18/2015, 10:37 AM

## 2015-06-18 NOTE — Telephone Encounter (Signed)
-----   Message from Lequita Asal, MD sent at 06/18/2015 12:03 PM EDT ----- Regarding: Please call  Needs IV iron next week.  Ferritin low. Weekly x 3  M  ----- Message -----    From: Lab In [facility] Interface    Sent: 06/18/2015   9:59 AM      To: Lequita Asal, MD

## 2015-06-20 ENCOUNTER — Encounter: Payer: Self-pay | Admitting: Hematology and Oncology

## 2015-06-20 NOTE — Progress Notes (Addendum)
[facility] day:  06/18/2015  Chief Complaint: Zachary Buck is a 75 y.o. male  with stage II multiple myeloma and iron deficiency anemia who is seen for 3 month assessment.  HPI: The patient was last seen in the medical oncology clinic on 03/22/2015.  At that time, his hematocrit was decreasing on oral iron.  Ferritin was 20.  He received 200 mg Venofer weekly 3 (03/26/2015 - 04/14/2015).  Ferritin improved to 81 on 05/03/2015. Symptomatically, he felt better after his infusions. He notes that he feels tired again.  During the interim, he continues to have intermittent diarrhea. He is taking Nadolol for his portal hypertension and esophageal varices.  He denies any issues with infections or bone pain.   Past Medical History  Diagnosis Date  . Hypercholesterolemia   . Atherosclerotic heart disease     s/p MI, s/p stent mid circumflex  . Hypertension   . Diabetes mellitus without complication ([place])   . Clavicle fracture   . Leg fracture   . Myocardial infarct ([place])   . CAD (coronary artery disease)   . Prostate enlargement   . Internal hemorrhoids   . Anemia   . Tubular adenoma of colon   . Esophageal varices ([place])   . Cirrhosis [facility])     Past Surgical History  Procedure Laterality Date  . Tonsillectomy    . Coronary angioplasty with stent placement    . Esophagogastroduodenoscopy (egd) with propofol N/A 03/04/2015    Procedure: ESOPHAGOGASTRODUODENOSCOPY (EGD) WITH PROPOFOL;  Surgeon: Jerene Bears, MD;  Location: [facility];  Service: Gastroenterology;  Laterality: N/A;  . Colonoscopy with propofol N/A 03/04/2015    Procedure: COLONOSCOPY WITH PROPOFOL;  Surgeon: Jerene Bears, MD;  Location: [facility];  Service: Gastroenterology;  Laterality: N/A;    Family History  Problem Relation Age of Onset  . Aneurysm Father 75    of the heart  . Diabetes Mother   . Heart attack Father     Social History:  reports that he  has never smoked. He has never used smokeless tobacco. He reports that he does not drink alcohol or use illicit drugs.  Patient denies any exposure to radiation or toxins. The patient is accompanied by his wife, Zachary Buck, today.  Allergies:  Allergies  Allergen Reactions  . Niaspan [Niacin Er]     Current Medications: Current Outpatient Prescriptions  Medication Sig Dispense Refill  . Ascorbic Acid (VITAMIN C) 1000 MG tablet Take 1,000 mg by mouth daily.    . B Complex Vitamins (VITAMIN B COMPLEX) TABS Take by mouth daily.    . blood glucose meter kit and supplies KIT Dispense based on patient and insurance preference. Use up to four times daily as directed. (FOR ICD-9 250.00, 250.01). 1 each 0  . Cholecalciferol (VITAMIN D-3) 1000 UNITS CAPS Take by mouth.    Lyndle Herrlich SULFATE PO Take 1 tablet by mouth 3 (three) times daily.    Marland Kitchen glipiZIDE (GLUCOTROL) 5 MG tablet Use 5 mg in the morning and 10 mg in the evening. 270 tablet 3  . glucose blood (BAYER CONTOUR NEXT TEST) test strip Contour Next EZ test strips: check blood sugars twice a day. Dx: 250.00    . glucose blood (ONE TOUCH ULTRA TEST) test strip Use as instructed 100 each 12  . Lancets (ONETOUCH ULTRASOFT) lancets Use as instructed 100 each 12  . lisinopril (PRINIVIL,ZESTRIL) 20 MG tablet TAKE ONE TABLET BY MOUTH EVERY  DAY    . Magnesium 500 MG CAPS Take 500 mg by mouth daily.    . metFORMIN (GLUCOPHAGE) 1000 MG tablet Take 1 tablet (1,000 mg total) by mouth 2 (two) times daily with a meal. 60 tablet 3  . nadolol (CORGARD) 20 MG tablet Take 1 tablet (20 mg total) by mouth daily. 30 tablet 11  . Omega-3 Fatty Acids (FISH OIL PO) Take by mouth.    . Omega-3 Fatty Acids (FISH OIL) 1000 MG CAPS Take 1,000 mg by mouth daily.     Marland Kitchen OVER THE COUNTER MEDICATION Take 4 capsules by mouth 3 (three) times daily. curamed 750 mg capsules    . propranolol (INDERAL) 20 MG tablet Take 1 tablet (20 mg total) by mouth 2 (two) times daily. 60 tablet 3   . sildenafil (VIAGRA) 25 MG tablet Take 25 mg by mouth as needed for erectile dysfunction.    . triamcinolone cream (KENALOG) 0.1 % Apply topically 2 (two) times daily. Please avoid face and genitalia area.  Do not use in the same spot for more than 7-10 days in a row. 30 g 0  . Turmeric POWD 4,000 mg by Does not apply route daily.    . vitamin B-12 (CYANOCOBALAMIN) 100 MCG tablet Take 200 mcg by mouth daily.     No current facility-administered medications for this visit.   Facility-Administered Medications Ordered in Other Visits  Medication Dose Route Frequency Provider Last Rate Last Dose  . alteplase (CATHFLO ACTIVASE) injection 2 mg  2 mg Intracatheter Once PRN Lequita Asal, MD      . heparin lock flush 100 unit/mL  500 Units Intracatheter Once PRN Lequita Asal, MD      . heparin lock flush 100 unit/mL  250 Units Intracatheter Once PRN Lequita Asal, MD      . sodium chloride 0.9 % injection 10 mL  10 mL Intracatheter PRN Lequita Asal, MD      . sodium chloride 0.9 % injection 3 mL  3 mL Intravenous Once PRN Lequita Asal, MD        Review of Systems:  GENERAL:  Fatigue.  No fevers or sweats.  Weight down 3 pounds. PERFORMANCE STATUS (ECOG):  1 HEENT:  No visual changes, runny nose, sore throat, mouth sores or tenderness. Lungs: No shortness of breath or cough.  No hemoptysis. Cardiac:  No chest pain, palpitations, orthopnea, or PND. GI:  Chronic diarrhea.  Occasional black stools and nausea.  No vomiting, constipation, or hematochezia. GU:  No urgency, frequency, dysuria, or hematuria. Musculoskeletal:  No back pain.  No joint pain.  No muscle tenderness. Extremities:  No pain or swelling. Skin:  No rashes or skin changes. Neuro:  No headache, numbness or weakness, balance or coordination issues. Endocrine:  Diabetes.  No tthyroid issues, hot flashes or night sweats. Psych:  No mood changes, depression or anxiety. Pain:  No focal pain. Review of  systems:  All other systems reviewed and found to be negative.  Physical Exam: Blood pressure 153/75, pulse 58, temperature 97.8 F (36.6 C), temperature source Tympanic, height '5\' 9"'$  (1.753 m), weight 181 pounds (82.4 kg). GENERAL:  Well developed, well nourished, sitting comfortably in the exam room in no acute distress. MENTAL STATUS:  Alert and oriented to person, place and time. HEAD:  Pearline Cables hair.  Normocephalic, atraumatic, face symmetric, no Cushingoid features. EYES:  Blue eyes.  Pupils equal round and reactive to light and accomodation.  No conjunctivitis or  scleral icterus. ENT:  Oropharynx clear without lesion.  Tongue normal. Mucous membranes moist.  RESPIRATORY:  Clear to auscultation without rales, wheezes or rhonchi. CARDIOVASCULAR:  Regular rate and rhythm without murmur, rub or gallop. ABDOMEN:  Soft, non-tender with active bowel sounds and no hepatomegaly.  Spleen palpable 2 finger breaths below right costal margin.  No masses. SKIN:  No rashes, ulcers or lesions. EXTREMITIES: Left elbow cyst, resolved.  No edema, no skin discoloration or tenderness.  No palpable cords. LYMPH NODES: No palpable cervical, supraclavicular, axillary or inguinal adenopathy  NEUROLOGICAL: Unremarkable. PSYCH:  Appropriate.  Appointment on 06/18/2015  Component Date Value Ref Range Status  . WBC 06/18/2015 7.9  3.8 - 10.6 K/uL Final  . RBC 06/18/2015 3.68* 4.40 - 5.90 MIL/uL Final  . Hemoglobin 06/18/2015 10.8* 13.0 - 18.0 g/dL Final  . HCT 06/18/2015 31.4* 40.0 - 52.0 % Final  . MCV 06/18/2015 85.2  80.0 - 100.0 fL Final  . MCH 06/18/2015 29.3  26.0 - 34.0 pg Final  . MCHC 06/18/2015 34.3  32.0 - 36.0 g/dL Final  . RDW 06/18/2015 17.3* 11.5 - 14.5 % Final  . Platelets 06/18/2015 99* 150 - 440 K/uL Final  . Neutrophils Relative % 06/18/2015 67   Final  . Neutro Abs 06/18/2015 5.3  1.4 - 6.5 K/uL Final  . Lymphocytes Relative 06/18/2015 16   Final  . Lymphs Abs 06/18/2015 1.3  1.0 - 3.6 K/uL  Final  . Monocytes Relative 06/18/2015 10   Final  . Monocytes Absolute 06/18/2015 0.8  0.2 - 1.0 K/uL Final  . Eosinophils Relative 06/18/2015 6   Final  . Eosinophils Absolute 06/18/2015 0.4  0 - 0.7 K/uL Final  . Basophils Relative 06/18/2015 1   Final  . Basophils Absolute 06/18/2015 0.1  0 - 0.1 K/uL Final  . Sodium 06/18/2015 137  135 - 145 mmol/L Final  . Potassium 06/18/2015 4.8  3.5 - 5.1 mmol/L Final  . Chloride 06/18/2015 109  101 - 111 mmol/L Final  . CO2 06/18/2015 24  22 - 32 mmol/L Final  . Glucose, Bld 06/18/2015 246* 65 - 99 mg/dL Final  . BUN 06/18/2015 25* 6 - 20 mg/dL Final  . Creatinine, Ser 06/18/2015 1.15  0.61 - 1.24 mg/dL Final  . Calcium 06/18/2015 9.6  8.9 - 10.3 mg/dL Final  . Total Protein 06/18/2015 7.9  6.5 - 8.1 g/dL Final  . Albumin 06/18/2015 3.3* 3.5 - 5.0 g/dL Final  . AST 06/18/2015 31  15 - 41 U/L Final  . ALT 06/18/2015 31  17 - 63 U/L Final  . Alkaline Phosphatase 06/18/2015 70  38 - 126 U/L Final  . Total Bilirubin 06/18/2015 0.9  0.3 - 1.2 mg/dL Final  . GFR calc non Af Amer 06/18/2015 >60  >60 mL/min Final  . GFR calc Af Amer 06/18/2015 >60  >60 mL/min Final   Comment: (NOTE) The eGFR has been calculated using the CKD EPI equation. This calculation has not been validated in all clinical situations. eGFR's persistently <60 mL/min signify possible Chronic Kidney Disease.   . Anion gap 06/18/2015 4* 5 - 15 Final  . Ferritin 06/18/2015 23* 24 - 336 ng/mL Final    Assessment:  EUCLIDE GRANITO is a 75 y.o. male with iron deficiency anemia and stage II multiple myeloma. 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 Serum immunoglobulins noted  an IgG of 2930 (high). 24-hour urine revealed a 8.8% monoclonal protein (10.6 mg/24 hours). Bone survey on 10/20/2014 revealed no lytic lesions. Albumen was 3.3 on  08/26/2014. He had pneumonia in 05/2014.   SPEP was 2.3 g/dL on 10/09/2014, 01/18/2015, and 03/22/2015.  Kappa free light chains were 23.99, lambda free light chains 155.3, and a ratio of 0.15 (low) on 01/18/2015.  Bone marrow biopsy on 11/10/2014 revealed a 10% monoclonal plasma cell infiltrate. Marrow was hypercellular for age (40-50%) with mixed maturing hematopoiesis, relative erythroid hyperplasia and mild nonspecific dyserythropoiesis. There was patchy mild focally moderate increase in reticulin. There were no significant iron stores. Myeloma FISH panel revealed translocation (11;14) which results in fusion of CCND1 (BCL1) at 11q13 with the immunoglobulin heavy chain gene (IgH) at 14q32 (a favorable prognostic feature). Beta2 microglobulin was 4.2 on 11/24/2014.  PET scan on 12/07/2014 revealed no hypermetabolic foci within the marrow space or nodal stations to suggest active multiple myeloma. There was hypermetabolism within the right lower lobe, corresponding to a similar dependent density. Morphology favored scarring.There was cirrhosis and portal hypertension. There was gastric body hypermetabolism favoring physiologic. Spleen was 15.3 cm (volume 1065 cc).  There was pulmonary artery enlargement suggesting pulmonary artery hypertension. 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  He has a history of asbestos exposure in the [employer].  He has had mild anemia since 10/2013 and mild thrombocytopenia since 05/2014. Colonoscopy in 10/2013 revealed polyps. EGD revealed gastritis. Capsule enteroscopy on 11/27/2014 revealed 1-2 gastric polyps and one colon polyp.  There were no findings to explain iron deficiency anemia.  His diet was good. Labs confirmed iron deficiency (ferritin 11.8 on 05/2014 and 13 on 10/09/2014). B12 was low normal (MMA normal) thus ruling out B12 deficiency.  Colonoscopy on 03/04/2015 revealed a sessile polyp was seen in the transverse colon (tubular adenoma). EGD on  03/04/2015 revealed medium-sized esophageal varices in the mid and distal esophagus without bleeding. There was a sessile polyp in the gastric antrum (hyperplastic polyp).  There were 3 small angiodysplastic lesions in the second part of the duodenum.  It was recommended that he continue Protonix.  He is on Nadolol.  He was told that the etiology of his cirrhosis was fatty liver.  Abdominal CT scan on 03/16/2015 revealed splenomegaly (14.5 cm), cirrhosis, and stigmata of portal hypertension with esophageal and gastric varices.  There were no liver lesions.  He stopped his Protonix secondary to pain in his stomach. He stopped his pancreatic enzymes. He notes diarrhea for a long time (6 months to 2 years).  He has persistent iron deficiency anemia.  He received 200 mg Venofer weekly 3 (03/26/2015 - 04/14/2015).  Ferritin improved from 20 to 81 on 05/03/2015.  Ferritin has decreased to 23 (low).   He has been taking oral iron 3 times a day with vitamin C.  Symptomatically, he has chronic diarrhea. Energy level is stable.  He remains anemic (hematocrit 31.4 with a hemoglobin 10.8).   Plan: 1.  Labs today:  CBC with diff, CMP, SPEP, FLCA, beta2-microglobulin, ferritin. 2.  Discuss anemia and low albumen.  Discuss myeloma defining events.  Await SPEP, FLCA, and beta2-microglobulin. 3.  Discuss concern for recurrent iron deficiency anemia.  Discuss IV iron if ferritin low.  Ferritin goal 100. 4.  Discuss portal hypertension.  Discuss thrombocytopenia secondary to splenomegaly. 5.  Discuss diarrhea.  Discuss pancreatic enzymes.  Discuss Enterogam.  Information provided patient. 6.  Schedule chest CT without contrast:  follow-up RLL hypermetabolism noted on PET  scan. 7.  RTC after chest CT for MD assessment and +/- Venofer.  Addendum:  Patient contacted regarding low ferritin after clinic.  He will receive IV iron weekly x 3.   Lequita Asal, MD  06/18/2015, 10:37 AM

## 2015-06-21 ENCOUNTER — Ambulatory Visit
Admission: RE | Admit: 2015-06-21 | Discharge: 2015-06-21 | Disposition: A | Discharge status: Home / Self Care | Payer: PPO | Source: Ambulatory Visit | Attending: Hematology and Oncology | Admitting: Hematology and Oncology

## 2015-06-21 DIAGNOSIS — R161 Splenomegaly, not elsewhere classified: Secondary | ICD-10-CM | POA: Diagnosis not present

## 2015-06-21 DIAGNOSIS — I864 Gastric varices: Secondary | ICD-10-CM | POA: Insufficient documentation

## 2015-06-21 DIAGNOSIS — C9000 Multiple myeloma not having achieved remission: Secondary | ICD-10-CM | POA: Diagnosis not present

## 2015-06-21 DIAGNOSIS — J479 Bronchiectasis, uncomplicated: Secondary | ICD-10-CM | POA: Insufficient documentation

## 2015-06-21 DIAGNOSIS — J849 Interstitial pulmonary disease, unspecified: Secondary | ICD-10-CM | POA: Insufficient documentation

## 2015-06-21 DIAGNOSIS — I2510 Atherosclerotic heart disease of native coronary artery without angina pectoris: Secondary | ICD-10-CM | POA: Diagnosis not present

## 2015-06-21 DIAGNOSIS — I8500 Esophageal varices without bleeding: Secondary | ICD-10-CM | POA: Diagnosis not present

## 2015-06-21 DIAGNOSIS — R918 Other nonspecific abnormal finding of lung field: Secondary | ICD-10-CM | POA: Diagnosis not present

## 2015-06-21 DIAGNOSIS — J61 Pneumoconiosis due to asbestos and other mineral fibers: Secondary | ICD-10-CM | POA: Insufficient documentation

## 2015-06-21 DIAGNOSIS — K7460 Unspecified cirrhosis of liver: Secondary | ICD-10-CM | POA: Diagnosis not present

## 2015-06-21 DIAGNOSIS — R942 Abnormal results of pulmonary function studies: Secondary | ICD-10-CM | POA: Insufficient documentation

## 2015-06-21 LAB — PROTEIN ELECTROPHORESIS, SERUM
A/G Ratio: 0.8 (ref 0.7–1.7)
Albumin ELP: 3.2 g/dL (ref 2.9–4.4)
Alpha-1-Globulin: 0.2 g/dL (ref 0.0–0.4)
Alpha-2-Globulin: 0.6 g/dL (ref 0.4–1.0)
Beta Globulin: 0.9 g/dL (ref 0.7–1.3)
Gamma Globulin: 2.4 g/dL — ABNORMAL HIGH (ref 0.4–1.8)
Globulin, Total: 4.1 g/dL — ABNORMAL HIGH (ref 2.2–3.9)
M-Spike, %: 2.3 g/dL — ABNORMAL HIGH
Total Protein ELP: 7.3 g/dL (ref 6.0–8.5)

## 2015-06-21 LAB — BETA 2 MICROGLOBULIN, SERUM: Beta-2 Microglobulin: 3.8 mg/L — ABNORMAL HIGH (ref 0.6–2.4)

## 2015-06-21 LAB — KAPPA/LAMBDA LIGHT CHAINS
Kappa free light chain: 27.63 mg/L — ABNORMAL HIGH (ref 3.30–19.40)
Kappa, lambda light chain ratio: 0.16 — ABNORMAL LOW (ref 0.26–1.65)
Lambda free light chains: 174.59 mg/L — ABNORMAL HIGH (ref 5.71–26.30)

## 2015-06-22 ENCOUNTER — Inpatient Hospital Stay: Payer: PPO

## 2015-06-22 ENCOUNTER — Inpatient Hospital Stay: Payer: PPO | Attending: Hematology and Oncology | Admitting: Hematology and Oncology

## 2015-06-22 VITALS — BP 142/79 | HR 52 | Temp 97.0°F | Resp 17 | Ht 69.0 in | Wt 181.9 lb

## 2015-06-22 DIAGNOSIS — K7460 Unspecified cirrhosis of liver: Secondary | ICD-10-CM | POA: Diagnosis not present

## 2015-06-22 DIAGNOSIS — D509 Iron deficiency anemia, unspecified: Secondary | ICD-10-CM | POA: Insufficient documentation

## 2015-06-22 DIAGNOSIS — C9000 Multiple myeloma not having achieved remission: Secondary | ICD-10-CM | POA: Diagnosis not present

## 2015-06-22 DIAGNOSIS — I2510 Atherosclerotic heart disease of native coronary artery without angina pectoris: Secondary | ICD-10-CM | POA: Insufficient documentation

## 2015-06-22 DIAGNOSIS — D696 Thrombocytopenia, unspecified: Secondary | ICD-10-CM

## 2015-06-22 DIAGNOSIS — E119 Type 2 diabetes mellitus without complications: Secondary | ICD-10-CM | POA: Diagnosis not present

## 2015-06-22 DIAGNOSIS — R161 Splenomegaly, not elsewhere classified: Secondary | ICD-10-CM | POA: Insufficient documentation

## 2015-06-22 DIAGNOSIS — I10 Essential (primary) hypertension: Secondary | ICD-10-CM | POA: Diagnosis not present

## 2015-06-22 DIAGNOSIS — R9389 Abnormal findings on diagnostic imaging of other specified body structures: Secondary | ICD-10-CM

## 2015-06-22 DIAGNOSIS — K766 Portal hypertension: Secondary | ICD-10-CM | POA: Insufficient documentation

## 2015-06-22 DIAGNOSIS — Z79899 Other long term (current) drug therapy: Secondary | ICD-10-CM | POA: Insufficient documentation

## 2015-06-22 DIAGNOSIS — J449 Chronic obstructive pulmonary disease, unspecified: Secondary | ICD-10-CM | POA: Diagnosis not present

## 2015-06-22 DIAGNOSIS — R319 Hematuria, unspecified: Secondary | ICD-10-CM

## 2015-06-22 DIAGNOSIS — R197 Diarrhea, unspecified: Secondary | ICD-10-CM

## 2015-06-22 DIAGNOSIS — I252 Old myocardial infarction: Secondary | ICD-10-CM | POA: Diagnosis not present

## 2015-06-22 LAB — URINALYSIS COMPLETE WITH MICROSCOPIC (ARMC ONLY)
Bacteria, UA: NONE SEEN
Bilirubin Urine: NEGATIVE
Glucose, UA: NEGATIVE mg/dL
Hgb urine dipstick: NEGATIVE
Ketones, ur: NEGATIVE mg/dL
Leukocytes, UA: NEGATIVE
Nitrite: NEGATIVE
Protein, ur: 30 mg/dL — AB
Specific Gravity, Urine: 1.018 (ref 1.005–1.030)
Squamous Epithelial / LPF: NONE SEEN
pH: 5 (ref 5.0–8.0)

## 2015-06-22 NOTE — Progress Notes (Signed)
Pt reports bright red blood 2-3 dime size drops on floor.  Pts wife checked Hemorrhoids and reported the bleeding had stopped and didn't see anymore bleeding.

## 2015-06-22 NOTE — Progress Notes (Signed)
[facility] day:  06/22/2015  Chief Complaint: Zachary Buck is a 75 y.o. male  with stage II multiple myeloma and iron deficiency anemia who is seen for review of interval studies.  HPI: The patient was last seen in the medical oncology clinic on 06/18/2015.  At that time, he noted intermittent diarrhea.  He was taking Nadolol for his portal hypertension and esophageal varices. He denied any issues with infections or bone pain.  CBC on 06/18/2015 revealed a hematocrit 31.4, hemoglobin 10.8, MCV 85.2, platelets 99,000, white count 7900 with an [place] of 5300.  Creatinine was 1.15, calcium 9.6, albumin 3.3, and total protein 7.9. Beta-2 microglobulin was 3.8.  Serum protein electrophoresis revealed an M-spike of 2.3 gm/dL.   Kappa free light chains were 27.63, lambda free light chains 174.59 with a ratio of 0.16.  Ferritin was 23.  He was contacted with the plan to reinitiate IV iron.  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  There were no pleural effusions.  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  There was no frank honeycombing. This finding accounted for the medial right lower lobe hypermetabolism on the 12/07/2014 PET-CT.  There was three-vessel coronary atherosclerosis.  There was cirrhosis, splenomegaly, and prominent gastroesophageal varices.  This morning, the patient's wife notes that there was 4-5 dime size drops of blood on the floor. He has had hemorrhoids in the past which have broken open and bled.  He periodically has "horrible pain in the stomach".  He has had irritable bowel with ongoing issues with diarrhea.   Past Medical History  Diagnosis Date  . Hypercholesterolemia   .  Atherosclerotic heart disease     s/p MI, s/p stent mid circumflex  . Hypertension   . Diabetes mellitus without complication ([place])   . Clavicle fracture   . Leg fracture   . Myocardial infarct ([place])   . CAD (coronary artery disease)   . Prostate enlargement   . Internal hemorrhoids   . Anemia   . Tubular adenoma of colon   . Esophageal varices ([place])   . Cirrhosis [facility])     Past Surgical History  Procedure Laterality Date  . Tonsillectomy    . Coronary angioplasty with stent placement    . Esophagogastroduodenoscopy (egd) with propofol N/A 03/04/2015    Procedure: ESOPHAGOGASTRODUODENOSCOPY (EGD) WITH PROPOFOL;  Surgeon: Jerene Bears, MD;  Location: [facility];  Service: Gastroenterology;  Laterality: N/A;  . Colonoscopy with propofol N/A 03/04/2015    Procedure: COLONOSCOPY WITH PROPOFOL;  Surgeon: Jerene Bears, MD;  Location: [facility];  Service: Gastroenterology;  Laterality: N/A;    Family History  Problem Relation Age of Onset  . Aneurysm Father 78    of the heart  . Diabetes Mother   . Heart attack Father     Social History:  reports that he has never smoked. He has never used smokeless tobacco. He reports that he does not drink alcohol or use illicit drugs.  Patient denies any exposure to radiation or toxins. The patient is accompanied by his wife, [name], today.  Allergies:  Allergies  Allergen Reactions  . Niaspan [Niacin Er]     Current Medications: Current Outpatient Prescriptions  Medication Sig Dispense Refill  . Ascorbic Acid (VITAMIN C) 1000 MG tablet Take 1,000  mg by mouth daily.    . B Complex Vitamins (VITAMIN B COMPLEX) TABS Take by mouth daily.    . blood glucose meter kit and supplies KIT Dispense based on patient and insurance preference. Use up to four times daily as directed. (FOR ICD-9 250.00, 250.01). 1 each 0  . Cholecalciferol (VITAMIN D-3) 1000 UNITS CAPS Take by mouth.    Lyndle Herrlich SULFATE PO Take 1 tablet by mouth 3 (three) times  daily.    Marland Kitchen glipiZIDE (GLUCOTROL) 5 MG tablet Use 5 mg in the morning and 10 mg in the evening. 270 tablet 3  . glucose blood (BAYER CONTOUR NEXT TEST) test strip Contour Next EZ test strips: check blood sugars twice a day. Dx: 250.00    . glucose blood (ONE TOUCH ULTRA TEST) test strip Use as instructed 100 each 12  . Lancets (ONETOUCH ULTRASOFT) lancets Use as instructed 100 each 12  . lisinopril (PRINIVIL,ZESTRIL) 20 MG tablet TAKE ONE TABLET BY MOUTH EVERY DAY    . Magnesium 500 MG CAPS Take 500 mg by mouth daily.    . metFORMIN (GLUCOPHAGE) 1000 MG tablet Take 1 tablet (1,000 mg total) by mouth 2 (two) times daily with a meal. 60 tablet 3  . nadolol (CORGARD) 20 MG tablet Take 1 tablet (20 mg total) by mouth daily. 30 tablet 11  . Omega-3 Fatty Acids (FISH OIL PO) Take by mouth.    . Omega-3 Fatty Acids (FISH OIL) 1000 MG CAPS Take 1,000 mg by mouth daily.     Marland Kitchen OVER THE COUNTER MEDICATION Take 4 capsules by mouth 3 (three) times daily. curamed 750 mg capsules    . propranolol (INDERAL) 20 MG tablet Take 1 tablet (20 mg total) by mouth 2 (two) times daily. 60 tablet 3  . sildenafil (VIAGRA) 25 MG tablet Take 25 mg by mouth as needed for erectile dysfunction.    . triamcinolone cream (KENALOG) 0.1 % Apply topically 2 (two) times daily. Please avoid face and genitalia area.  Do not use in the same spot for more than 7-10 days in a row. 30 g 0  . Turmeric POWD 4,000 mg by Does not apply route daily.    . vitamin B-12 (CYANOCOBALAMIN) 100 MCG tablet Take 200 mcg by mouth daily.     No current facility-administered medications for this visit.   Facility-Administered Medications Ordered in Other Visits  Medication Dose Route Frequency Provider Last Rate Last Dose  . alteplase (CATHFLO ACTIVASE) injection 2 mg  2 mg Intracatheter Once PRN Lequita Asal, MD      . heparin lock flush 100 unit/mL  500 Units Intracatheter Once PRN Lequita Asal, MD      . heparin lock flush 100 unit/mL   250 Units Intracatheter Once PRN Lequita Asal, MD      . sodium chloride 0.9 % injection 10 mL  10 mL Intracatheter PRN Lequita Asal, MD      . sodium chloride 0.9 % injection 3 mL  3 mL Intravenous Once PRN Lequita Asal, MD        Review of Systems:  GENERAL:  Fatigue.  No fevers or sweats.  Weight stable. PERFORMANCE STATUS (ECOG):  1 HEENT:  No visual changes, runny nose, sore throat, mouth sores or tenderness. Lungs: No shortness of breath or cough.  No hemoptysis. Cardiac:  No chest pain, palpitations, orthopnea, or PND. GI:  Chronic diarrhea.  Intermittent abdominal pain.  Hematochezia this morning.  No vomiting  or constipation. GU:  No urgency, frequency, dysuria, or hematuria. Musculoskeletal:  No back pain.  No joint pain.  No muscle tenderness. Extremities:  No pain or swelling. Skin:  No rashes or skin changes. Neuro:  No headache, numbness or weakness, balance or coordination issues. Endocrine:  Diabetes.  No tthyroid issues, hot flashes or night sweats. Psych:  No mood changes, depression or anxiety. Pain:  No focal pain. Review of systems:  All other systems reviewed and found to be negative.  Physical Exam: Blood pressure 153/75, pulse 58, temperature 97.8 F (36.6 C), temperature source Tympanic, height _0  (1.753 m), weight 181 pounds (82.4 kg). GENERAL:  Well developed, well nourished, sitting comfortably in the exam room in no acute distress. MENTAL STATUS:  Alert and oriented to person, place and time. HEAD:  Pearline Cables hair.  Normocephalic, atraumatic, face symmetric, no Cushingoid features. EYES:  Blue eyes.  No conjunctivitis or scleral icterus. RECTAL:  Large external hemorrhoid. No rectal blood.  Blood anterior in underwear (? urine) NEUROLOGICAL: Unremarkable. PSYCH:  Appropriate.  No visits with results within 3 Day(s) from this visit. Latest known visit with results is:  Appointment on 06/18/2015  Component Date Value Ref Range Status  .  WBC 06/18/2015 7.9  3.8 - 10.6 K/uL Final  . RBC 06/18/2015 3.68* 4.40 - 5.90 MIL/uL Final  . Hemoglobin 06/18/2015 10.8* 13.0 - 18.0 g/dL Final  . HCT 06/18/2015 31.4* 40.0 - 52.0 % Final  . MCV 06/18/2015 85.2  80.0 - 100.0 fL Final  . MCH 06/18/2015 29.3  26.0 - 34.0 pg Final  . MCHC 06/18/2015 34.3  32.0 - 36.0 g/dL Final  . RDW 06/18/2015 17.3* 11.5 - 14.5 % Final  . Platelets 06/18/2015 99* 150 - 440 K/uL Final  . Neutrophils Relative % 06/18/2015 67   Final  . Neutro Abs 06/18/2015 5.3  1.4 - 6.5 K/uL Final  . Lymphocytes Relative 06/18/2015 16   Final  . Lymphs Abs 06/18/2015 1.3  1.0 - 3.6 K/uL Final  . Monocytes Relative 06/18/2015 10   Final  . Monocytes Absolute 06/18/2015 0.8  0.2 - 1.0 K/uL Final  . Eosinophils Relative 06/18/2015 6   Final  . Eosinophils Absolute 06/18/2015 0.4  0 - 0.7 K/uL Final  . Basophils Relative 06/18/2015 1   Final  . Basophils Absolute 06/18/2015 0.1  0 - 0.1 K/uL Final  . Sodium 06/18/2015 137  135 - 145 mmol/L Final  . Potassium 06/18/2015 4.8  3.5 - 5.1 mmol/L Final  . Chloride 06/18/2015 109  101 - 111 mmol/L Final  . CO2 06/18/2015 24  22 - 32 mmol/L Final  . Glucose, Bld 06/18/2015 246* 65 - 99 mg/dL Final  . BUN 06/18/2015 25* 6 - 20 mg/dL Final  . Creatinine, Ser 06/18/2015 1.15  0.61 - 1.24 mg/dL Final  . Calcium 06/18/2015 9.6  8.9 - 10.3 mg/dL Final  . Total Protein 06/18/2015 7.9  6.5 - 8.1 g/dL Final  . Albumin 06/18/2015 3.3* 3.5 - 5.0 g/dL Final  . AST 06/18/2015 31  15 - 41 U/L Final  . ALT 06/18/2015 31  17 - 63 U/L Final  . Alkaline Phosphatase 06/18/2015 70  38 - 126 U/L Final  . Total Bilirubin 06/18/2015 0.9  0.3 - 1.2 mg/dL Final  . GFR calc non Af Amer 06/18/2015 >60  >60 mL/min Final  . GFR calc Af Amer 06/18/2015 >60  >60 mL/min Final   Comment: (NOTE) The eGFR has been calculated using  the CKD EPI equation. This calculation has not been validated in all clinical situations. eGFR's persistently <60 mL/min signify  possible Chronic Kidney Disease.   . Anion gap 06/18/2015 4* 5 - 15 Final  . Total Protein ELP 06/18/2015 7.3  6.0 - 8.5 g/dL Final  . Albumin ELP 06/18/2015 3.2  2.9 - 4.4 g/dL Final  . Alpha-1-Globulin 06/18/2015 0.2  0.0 - 0.4 g/dL Final  . Alpha-2-Globulin 06/18/2015 0.6  0.4 - 1.0 g/dL Final  . Beta Globulin 06/18/2015 0.9  0.7 - 1.3 g/dL Final  . Gamma Globulin 06/18/2015 2.4* 0.4 - 1.8 g/dL Final  . M-Spike, % 06/18/2015 2.3* Not Observed g/dL Final  . SPE Interp. 06/18/2015 Comment   Final   Comment: (NOTE) The SPE pattern demonstrates a single peak (M-spike) in the gamma region which may represent monoclonal protein. This peak may also be caused by circulating immune complexes, cryoglobulins, C-reactive protein, fibrinogen or hemolysis.  If clinically indicated, the presence of a monoclonal gammopathy may be confirmed by immuno- fixation, as well as an evaluation of the urine for the presence of Bence-Jones protein. Performed At: [facility] [place], [place] [id number] Lindon Romp MD [id number]   . Comment 06/18/2015 Comment   Final   Comment: (NOTE) Protein electrophoresis scan will follow via computer, mail, or courier delivery.   Marland Kitchen GLOBULIN, TOTAL 06/18/2015 4.1* 2.2 - 3.9 g/dL Corrected  . A/G Ratio 06/18/2015 0.8  0.7 - 1.7 Corrected  . Kappa free light chain 06/18/2015 27.63* 3.30 - 19.40 mg/L Final  . Lamda free light chains 06/18/2015 174.59* 5.71 - 26.30 mg/L Final  . Kappa, lamda light chain ratio 06/18/2015 0.16* 0.26 - 1.65 Final   Comment: (NOTE) Performed At: [facility] [place], [place] [id number] Lindon Romp MD [id number]   . Ferritin 06/18/2015 23* 24 - 336 ng/mL Final  . Beta-2 Microglobulin 06/18/2015 3.8* 0.6 - 2.4 mg/L Final   Comment: (NOTE) Performed At: [facility] [address] [place], [place] [id number] Lindon Romp MD [id number]     Assessment:  BABAK LUCUS is a 75 y.o. male with iron deficiency anemia and stage II multiple myeloma. 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 Serum immunoglobulins noted an IgG of 2930 (high). 24-hour urine revealed a 8.8% monoclonal protein (10.6 mg/24 hours). Bone survey on 10/20/2014 revealed no lytic lesions. Albumen was 3.3 on 08/26/2014. He had pneumonia in 05/2014.   SPEP was 2.3 g/dL on 10/09/2014, 01/18/2015, 03/22/2015, and 06/18/2015.  Kappa free light chains were 23.99, lambda free light chains 155.3, and a ratio of 0.15 (low) on 01/18/2015.  Kappa free light chains were 27.63, lambda free light chains 174.59, and a ratio of 0.16 on 06/18/2015.  Bone marrow biopsy on 11/10/2014 revealed a 10% monoclonal plasma cell infiltrate. Marrow was hypercellular for age (40-50%) with mixed maturing hematopoiesis, relative erythroid hyperplasia and mild nonspecific dyserythropoiesis. There was patchy mild focally moderate increase in reticulin. There were no significant iron stores. Myeloma FISH panel revealed translocation (11;14) which results in fusion of CCND1 (BCL1) at 11q13 with the immunoglobulin heavy chain gene (IgH) at 14q32 (a favorable prognostic feature). Beta2 microglobulin was 4.2 on 11/24/2014 and 3.8 on 06/18/2015.  PET scan on 12/07/2014 revealed no hypermetabolic foci within the marrow space or nodal stations to suggest active multiple myeloma. There was  hypermetabolism within the right lower lobe, corresponding to a similar dependent density. Morphology favored scarring.There was cirrhosis and portal hypertension. There was gastric body hypermetabolism favoring physiologic. Spleen was 15.3 cm (volume 1065 cc).  There was pulmonary artery enlargement suggesting pulmonary artery hypertension. 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  He has a history  of asbestos exposure in the [employer].  Abdominal CT scan on 03/16/2015 revealed splenomegaly (14.5 cm), cirrhosis, and stigmata of portal hypertension with esophageal and gastric varices.  There were no liver lesions.  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  There were no pleural effusions.  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  There was no frank honeycombing. This finding accounted for the medial right lower lobe hypermetabolism on the 12/07/2014 PET-CT.  There was cirrhosis, splenomegaly, and prominent gastroesophageal varices.  He has had mild anemia since 10/2013 and mild thrombocytopenia since 05/2014. Colonoscopy in 10/2013 revealed polyps. EGD revealed gastritis. Capsule enteroscopy on 11/27/2014 revealed 1-2 gastric polyps and one colon polyp.  There were no findings to explain iron deficiency anemia.  His diet was good. Labs confirmed iron deficiency (ferritin 11.8 on 05/2014 and 13 on 10/09/2014). B12 was low normal (MMA normal) thus ruling out B12 deficiency.  Colonoscopy on 03/04/2015 revealed a sessile polyp was seen in the transverse colon (tubular adenoma). EGD on 03/04/2015 revealed medium-sized esophageal varices in the mid and distal esophagus without bleeding. There was a sessile polyp in the gastric antrum (hyperplastic polyp).  There were 3 small angiodysplastic lesions in the second part of the duodenum.  It was recommended that he continue Protonix.  He is on Nadolol.  He was told that the etiology of his cirrhosis was fatty liver.  He stopped his Protonix secondary to pain in his stomach. He stopped his pancreatic enzymes. He notes diarrhea for a long time (6 months to 2 years).  He has persistent iron deficiency anemia.  He  received 200 mg Venofer weekly 3 (03/26/2015 - 04/14/2015).  Ferritin improved from 20 to 81 on 05/03/2015.  Ferritin  decreased to 23 (low) on 06/18/2015.   He has been taking oral iron 3 times a day with vitamin C.  Symptomatically, is fatigued. He had some bleeding this morning likely due to hemorroids.  He remains anemic (hematocrit 31.4 with a hemoglobin 10.8).   Plan: 1.  Review myeloma work-up.  Stable.  No myeloma defining events.  Continue observation. 2.  Discuss recurrent iron deficiency.  Possible slow ooze with portal hypertension.  Plan IV iron x 3. 3.  Discuss portal hypertension.  Thrombocytopenia secondary to splenomegaly. 4.  Discuss chest CT.  Hypermetabolic lesion in medial right lower lobe discussed.  Will need ongoing surveillance given asbestos exposure. 5.  Discuss diarrhea.  Discuss Enterogam. Patient's wife read information and would like sample trial. 6.  Urinalysis today. 7.  Enterogam 1 packet a day with fluid or pudding. 8.  RTC as previously scheduled for IV iron (weekly x 3). 9.  RTC in 1 month for MD assessment and labs (CBC with diff, ferritin).   Lequita Asal, MD  06/22/2015, 9:31 AM

## 2015-06-25 ENCOUNTER — Other Ambulatory Visit: Payer: Self-pay | Admitting: Hematology and Oncology

## 2015-06-25 ENCOUNTER — Inpatient Hospital Stay: Payer: PPO

## 2015-06-25 VITALS — BP 123/68 | HR 57 | Temp 97.2°F | Resp 20

## 2015-06-25 DIAGNOSIS — D509 Iron deficiency anemia, unspecified: Secondary | ICD-10-CM

## 2015-06-25 DIAGNOSIS — C9000 Multiple myeloma not having achieved remission: Secondary | ICD-10-CM | POA: Diagnosis not present

## 2015-06-25 MED ORDER — SODIUM CHLORIDE 0.9 % IV SOLN
Freq: Once | INTRAVENOUS | Status: AC
  Administered 2015-06-25: 14:00:00 via INTRAVENOUS
  Filled 2015-06-25: qty 1000

## 2015-06-25 MED ORDER — IRON SUCROSE 20 MG/ML IV SOLN
200.0000 mg | Freq: Once | INTRAVENOUS | Status: AC
  Administered 2015-06-25: 200 mg via INTRAVENOUS
  Filled 2015-06-25: qty 10

## 2015-06-28 ENCOUNTER — Encounter: Payer: Self-pay | Admitting: Hematology and Oncology

## 2015-07-02 ENCOUNTER — Other Ambulatory Visit: Payer: Self-pay | Admitting: Hematology and Oncology

## 2015-07-02 ENCOUNTER — Inpatient Hospital Stay: Payer: PPO

## 2015-07-02 VITALS — BP 143/76 | HR 53 | Temp 97.0°F | Resp 18

## 2015-07-02 DIAGNOSIS — D509 Iron deficiency anemia, unspecified: Secondary | ICD-10-CM

## 2015-07-02 DIAGNOSIS — C9000 Multiple myeloma not having achieved remission: Secondary | ICD-10-CM | POA: Diagnosis not present

## 2015-07-02 MED ORDER — SODIUM CHLORIDE 0.9 % IV SOLN
200.0000 mg | Freq: Once | INTRAVENOUS | Status: AC
  Administered 2015-07-02: 200 mg via INTRAVENOUS
  Filled 2015-07-02: qty 10

## 2015-07-02 MED ORDER — SODIUM CHLORIDE 0.9 % IV SOLN
Freq: Once | INTRAVENOUS | Status: AC
  Administered 2015-07-02: 14:00:00 via INTRAVENOUS
  Filled 2015-07-02: qty 1000

## 2015-07-05 ENCOUNTER — Other Ambulatory Visit: Payer: PPO

## 2015-07-05 ENCOUNTER — Ambulatory Visit: Payer: PPO | Admitting: Hematology and Oncology

## 2015-07-07 ENCOUNTER — Ambulatory Visit (INDEPENDENT_AMBULATORY_CARE_PROVIDER_SITE_OTHER): Payer: PPO | Admitting: Ophthalmology

## 2015-07-07 DIAGNOSIS — E113293 Type 2 diabetes mellitus with mild nonproliferative diabetic retinopathy without macular edema, bilateral: Secondary | ICD-10-CM | POA: Diagnosis not present

## 2015-07-07 DIAGNOSIS — H35033 Hypertensive retinopathy, bilateral: Secondary | ICD-10-CM

## 2015-07-07 DIAGNOSIS — H35371 Puckering of macula, right eye: Secondary | ICD-10-CM

## 2015-07-07 DIAGNOSIS — I10 Essential (primary) hypertension: Secondary | ICD-10-CM

## 2015-07-07 DIAGNOSIS — E11319 Type 2 diabetes mellitus with unspecified diabetic retinopathy without macular edema: Secondary | ICD-10-CM

## 2015-07-07 DIAGNOSIS — H43813 Vitreous degeneration, bilateral: Secondary | ICD-10-CM

## 2015-07-08 ENCOUNTER — Telehealth: Payer: Self-pay | Admitting: *Deleted

## 2015-07-08 NOTE — Telephone Encounter (Signed)
Pt's wife called and left message on my voicemail and said the enteragam is helping with diarrhea.  They are going  To go on vacation next week and will be running out and wanted a another sample.  Spoke to pharmacy and they have sample and then I told them to hold it til tom. He was coming for venofer.  I have spoke to [facility] who is charge and let her know he needs it also.  I then called wife and got her voicemail and let her know that pt can ask for it tom.when he comes while back in infusion.

## 2015-07-09 ENCOUNTER — Other Ambulatory Visit: Payer: Self-pay | Admitting: Hematology and Oncology

## 2015-07-09 ENCOUNTER — Inpatient Hospital Stay: Payer: PPO

## 2015-07-09 VITALS — BP 136/74 | HR 61 | Temp 97.0°F | Resp 18

## 2015-07-09 DIAGNOSIS — D509 Iron deficiency anemia, unspecified: Secondary | ICD-10-CM

## 2015-07-09 DIAGNOSIS — C9000 Multiple myeloma not having achieved remission: Secondary | ICD-10-CM | POA: Diagnosis not present

## 2015-07-09 MED ORDER — SODIUM CHLORIDE 0.9 % IV SOLN
200.0000 mg | Freq: Once | INTRAVENOUS | Status: AC
  Administered 2015-07-09: 200 mg via INTRAVENOUS
  Filled 2015-07-09: qty 10

## 2015-07-09 MED ORDER — SODIUM CHLORIDE 0.9 % IV SOLN
INTRAVENOUS | Status: DC
Start: 2015-07-09 — End: 2015-07-09
  Administered 2015-07-09: 14:00:00 via INTRAVENOUS
  Filled 2015-07-09: qty 1000

## 2015-07-26 ENCOUNTER — Other Ambulatory Visit: Payer: Self-pay | Admitting: *Deleted

## 2015-07-26 ENCOUNTER — Inpatient Hospital Stay: Payer: PPO | Attending: Hematology and Oncology

## 2015-07-26 ENCOUNTER — Inpatient Hospital Stay (HOSPITAL_BASED_OUTPATIENT_CLINIC_OR_DEPARTMENT_OTHER): Payer: PPO | Admitting: Hematology and Oncology

## 2015-07-26 ENCOUNTER — Telehealth: Payer: Self-pay | Admitting: *Deleted

## 2015-07-26 VITALS — BP 157/73 | HR 58 | Temp 95.9°F | Resp 18 | Wt 177.5 lb

## 2015-07-26 DIAGNOSIS — R161 Splenomegaly, not elsewhere classified: Secondary | ICD-10-CM

## 2015-07-26 DIAGNOSIS — R319 Hematuria, unspecified: Secondary | ICD-10-CM

## 2015-07-26 DIAGNOSIS — N400 Enlarged prostate without lower urinary tract symptoms: Secondary | ICD-10-CM | POA: Insufficient documentation

## 2015-07-26 DIAGNOSIS — I2510 Atherosclerotic heart disease of native coronary artery without angina pectoris: Secondary | ICD-10-CM | POA: Diagnosis not present

## 2015-07-26 DIAGNOSIS — Z79899 Other long term (current) drug therapy: Secondary | ICD-10-CM

## 2015-07-26 DIAGNOSIS — E119 Type 2 diabetes mellitus without complications: Secondary | ICD-10-CM | POA: Diagnosis not present

## 2015-07-26 DIAGNOSIS — Z77090 Contact with and (suspected) exposure to asbestos: Secondary | ICD-10-CM | POA: Insufficient documentation

## 2015-07-26 DIAGNOSIS — C9000 Multiple myeloma not having achieved remission: Secondary | ICD-10-CM | POA: Insufficient documentation

## 2015-07-26 DIAGNOSIS — K766 Portal hypertension: Secondary | ICD-10-CM

## 2015-07-26 DIAGNOSIS — D509 Iron deficiency anemia, unspecified: Secondary | ICD-10-CM

## 2015-07-26 DIAGNOSIS — I252 Old myocardial infarction: Secondary | ICD-10-CM | POA: Insufficient documentation

## 2015-07-26 DIAGNOSIS — I10 Essential (primary) hypertension: Secondary | ICD-10-CM | POA: Insufficient documentation

## 2015-07-26 DIAGNOSIS — R197 Diarrhea, unspecified: Secondary | ICD-10-CM

## 2015-07-26 DIAGNOSIS — E7800 Pure hypercholesterolemia, unspecified: Secondary | ICD-10-CM | POA: Diagnosis not present

## 2015-07-26 DIAGNOSIS — K7460 Unspecified cirrhosis of liver: Secondary | ICD-10-CM | POA: Diagnosis not present

## 2015-07-26 DIAGNOSIS — R9389 Abnormal findings on diagnostic imaging of other specified body structures: Secondary | ICD-10-CM

## 2015-07-26 DIAGNOSIS — D696 Thrombocytopenia, unspecified: Secondary | ICD-10-CM

## 2015-07-26 LAB — COMPREHENSIVE METABOLIC PANEL
ALT: 32 U/L (ref 17–63)
AST: 27 U/L (ref 15–41)
Albumin: 3.6 g/dL (ref 3.5–5.0)
Alkaline Phosphatase: 58 U/L (ref 38–126)
Anion gap: 8 (ref 5–15)
BUN: 41 mg/dL — ABNORMAL HIGH (ref 6–20)
CO2: 21 mmol/L — ABNORMAL LOW (ref 22–32)
Calcium: 10 mg/dL (ref 8.9–10.3)
Chloride: 107 mmol/L (ref 101–111)
Creatinine, Ser: 1.39 mg/dL — ABNORMAL HIGH (ref 0.61–1.24)
GFR calc Af Amer: 56 mL/min — ABNORMAL LOW (ref >60)
GFR calc non Af Amer: 48 mL/min — ABNORMAL LOW (ref >60)
Glucose, Bld: 207 mg/dL — ABNORMAL HIGH (ref 65–99)
Potassium: 4.6 mmol/L (ref 3.5–5.1)
Sodium: 136 mmol/L (ref 135–145)
Total Bilirubin: 0.8 mg/dL (ref 0.3–1.2)
Total Protein: 7.9 g/dL (ref 6.5–8.1)

## 2015-07-26 LAB — CBC WITH DIFFERENTIAL/PLATELET
Basophils Absolute: 0.1 10*3/uL (ref 0–0.1)
Basophils Relative: 1 %
Eosinophils Absolute: 0.5 10*3/uL (ref 0–0.7)
Eosinophils Relative: 7 %
HCT: 33.7 % — ABNORMAL LOW (ref 40.0–52.0)
Hemoglobin: 11.6 g/dL — ABNORMAL LOW (ref 13.0–18.0)
Lymphocytes Relative: 20 %
Lymphs Abs: 1.5 10*3/uL (ref 1.0–3.6)
MCH: 29.8 pg (ref 26.0–34.0)
MCHC: 34.4 g/dL (ref 32.0–36.0)
MCV: 86.6 fL (ref 80.0–100.0)
Monocytes Absolute: 0.8 10*3/uL (ref 0.2–1.0)
Monocytes Relative: 11 %
Neutro Abs: 4.4 10*3/uL (ref 1.4–6.5)
Neutrophils Relative %: 61 %
Platelets: 105 10*3/uL — ABNORMAL LOW (ref 150–440)
RBC: 3.89 MIL/uL — ABNORMAL LOW (ref 4.40–5.90)
RDW: 16.7 % — ABNORMAL HIGH (ref 11.5–14.5)
WBC: 7.3 10*3/uL (ref 3.8–10.6)

## 2015-07-26 LAB — FERRITIN: Ferritin: 121 ng/mL (ref 24–336)

## 2015-07-26 MED ORDER — SBI/PROTEIN ISOLATE 5 G PO PACK: 1.0000 | PACK | Freq: Every day | ORAL | Status: DC

## 2015-07-26 NOTE — Telephone Encounter (Signed)
Called and spoke to wife and told her the creat. Level and he should be drinking plenty of fluids including water, juices, smart water or gatorade. She states due to his sugar he does not drink gatorade. She will get smart water. I am forwarding the labs to dr scott. She was grateful for the help

## 2015-07-26 NOTE — Telephone Encounter (Signed)
-----   Message from Lequita Asal, MD sent at 07/26/2015  4:56 PM EDT ----- Regarding: Elevated BUN/Cr  Patient looks a little dehydrated based on his labs. Would encourage fluids. Can have PCP follow these labs.  M  ----- Message -----    From: Lab In [place] Interface    Sent: 07/26/2015   4:19 PM      To: Lequita Asal, MD

## 2015-07-26 NOTE — Telephone Encounter (Signed)
Med already refilled

## 2015-07-26 NOTE — Progress Notes (Signed)
[facility] day:  07/26/2015  Chief Complaint: Zachary Buck is a 75 y.o. male  with stage II multiple myeloma and iron deficiency anemia who is seen for 1 month assessment after interval IV iron.  HPI: The patient was last seen in the medical oncology clinic on 06/22/2015.  At that time, several studies were reviewed.  Myeloma evaluation revealed stable disease.  Continued observation was recommended.  We discussed his ongoing diarrhea.  Enterogam was discussed.  Samples for 1 month were provided.  We discussed his recurrent iron deficiency anemia.  Hemoglobin had decreased to 10.8 with a corresponding drop in his ferritin from 81 to 23.  Decision was made to proceed with additional IV iron.  He received Venofer 200 mg IV on 05/05, 05/12, and 07/09/2015.  Regarding his diarrhea, his wife notes improvement on Enterogam.  He has not had diarrhea in 1 week.  He has had no abdominal pain in 1 week.  He still uses imodium on occasion.   He continues to take his oral iron.  He notes 1 episode of "black stool".  In general, he feels weak and "moves slower".  His thinks that his feeling of being "slow" is unrelated to his nadolol.  He has lost 3 pounds due to a change in diet for his diabetes.   Past Medical History  Diagnosis Date  . Hypercholesterolemia   . Atherosclerotic heart disease     s/p MI, s/p stent mid circumflex  . Hypertension   . Diabetes mellitus without complication ([place])   . Clavicle fracture   . Leg fracture   . Myocardial infarct ([place])   . CAD (coronary artery disease)   . Prostate enlargement   . Internal hemorrhoids   . Anemia   . Tubular adenoma of colon   . Esophageal varices ([place])   . Cirrhosis [facility])     Past Surgical History  Procedure Laterality Date  . Tonsillectomy    . Coronary angioplasty with stent placement    . Esophagogastroduodenoscopy (egd) with propofol N/A 03/04/2015    Procedure:  ESOPHAGOGASTRODUODENOSCOPY (EGD) WITH PROPOFOL;  Surgeon: Jerene Bears, MD;  Location: [facility];  Service: Gastroenterology;  Laterality: N/A;  . Colonoscopy with propofol N/A 03/04/2015    Procedure: COLONOSCOPY WITH PROPOFOL;  Surgeon: Jerene Bears, MD;  Location: [facility];  Service: Gastroenterology;  Laterality: N/A;    Family History  Problem Relation Age of Onset  . Aneurysm Father 89    of the heart  . Diabetes Mother   . Heart attack Father     Social History:  reports that he has never smoked. He has never used smokeless tobacco. He reports that he does not drink alcohol or use illicit drugs.  Patient denies any exposure to radiation or toxins. The patient is accompanied by his wife, Zachary Buck, today.  Allergies:  Allergies  Allergen Reactions  . Niaspan [Niacin Er]     Current Medications: Current Outpatient Prescriptions  Medication Sig Dispense Refill  . Ascorbic Acid (VITAMIN C) 1000 MG tablet Take 1,000 mg by mouth daily.    . B Complex Vitamins (VITAMIN B COMPLEX) TABS Take by mouth daily.    . blood glucose meter kit and supplies KIT Dispense based on patient and insurance preference. Use up to four times daily as directed. (FOR ICD-9 250.00, 250.01). 1 each 0  . Cholecalciferol (VITAMIN D-3) 1000 UNITS CAPS Take by mouth.    Lyndle Herrlich SULFATE  PO Take 2 tablets by mouth daily.     Marland Kitchen glipiZIDE (GLUCOTROL) 5 MG tablet Use 5 mg in the morning and 10 mg in the evening. 270 tablet 3  . glucose blood (BAYER CONTOUR NEXT TEST) test strip Contour Next EZ test strips: check blood sugars twice a day. Dx: 250.00    . glucose blood (ONE TOUCH ULTRA TEST) test strip Use as instructed 100 each 12  . Lancets (ONETOUCH ULTRASOFT) lancets Use as instructed 100 each 12  . lisinopril (PRINIVIL,ZESTRIL) 20 MG tablet TAKE ONE TABLET BY MOUTH EVERY DAY    . Magnesium 500 MG CAPS Take 500 mg by mouth daily.    . metFORMIN (GLUCOPHAGE) 1000 MG tablet Take 1 tablet (1,000 mg total) by  mouth 2 (two) times daily with a meal. 60 tablet 3  . nadolol (CORGARD) 20 MG tablet Take 1 tablet (20 mg total) by mouth daily. 30 tablet 11  . Omega-3 Fatty Acids (FISH OIL) 1000 MG CAPS Take 1,000 mg by mouth daily.     Marland Kitchen OVER THE COUNTER MEDICATION Take 4 capsules by mouth 3 (three) times daily. curamed 750 mg capsules    . propranolol (INDERAL) 20 MG tablet Take 1 tablet (20 mg total) by mouth 2 (two) times daily. 60 tablet 3  . SBI/Protein Isolate (ENTERAGAM) 5 g PACK Take 1-2 packets by mouth daily.    . sildenafil (VIAGRA) 25 MG tablet Take 25 mg by mouth as needed for erectile dysfunction.    . triamcinolone cream (KENALOG) 0.1 % Apply topically 2 (two) times daily. Please avoid face and genitalia area.  Do not use in the same spot for more than 7-10 days in a row. 30 g 0  . Turmeric POWD 4,000 mg by Does not apply route daily.    . vitamin B-12 (CYANOCOBALAMIN) 100 MCG tablet Take 200 mcg by mouth daily.     No current facility-administered medications for this visit.   Facility-Administered Medications Ordered in Other Visits  Medication Dose Route Frequency Provider Last Rate Last Dose  . alteplase (CATHFLO ACTIVASE) injection 2 mg  2 mg Intracatheter Once PRN Lequita Asal, MD      . heparin lock flush 100 unit/mL  500 Units Intracatheter Once PRN Lequita Asal, MD      . heparin lock flush 100 unit/mL  250 Units Intracatheter Once PRN Lequita Asal, MD      . sodium chloride 0.9 % injection 10 mL  10 mL Intracatheter PRN Lequita Asal, MD      . sodium chloride 0.9 % injection 3 mL  3 mL Intravenous Once PRN Lequita Asal, MD        Review of Systems:  GENERAL:  Feels "slow".  No fevers or sweats.  Weight down 3 pounds secondary to change in diet. PERFORMANCE STATUS (ECOG):  1 HEENT:  Voice "not sa strong".  No visual changes, runny nose, sore throat, mouth sores or tenderness. Lungs: No shortness of breath or cough.  No hemoptysis. Cardiac:  No chest  pain, palpitations, orthopnea, or PND. GI:  No diarrhea or abdominal pain in 1 week.  Black stool x 1.  No vomiting or constipation. GU:  No urgency, frequency, dysuria, or hematuria. Musculoskeletal:  No back pain.  No joint pain.  No muscle tenderness. Extremities:  No pain or swelling. Skin:  No rashes or skin changes. Neuro:  No headache, numbness or weakness, balance or coordination issues. Endocrine:  Diabetes.  No  thyroid issues, hot flashes or night sweats. Psych:  No mood changes, depression or anxiety. Pain:  No focal pain. Review of systems:  All other systems reviewed and found to be negative.  Physical Exam: BP 157/73 mmHg  Pulse 58  Temp(Src) 95.9 F (35.5 C) (Tympanic)  Resp 18  Wt 177 lb 7.5 oz (80.5 kg)  GENERAL:  Well developed, well nourished, sitting comfortably in the exam room in no acute distress. MENTAL STATUS:  Alert and oriented to person, place and time. HEAD:  Pearline Cables hair.  Normocephalic, atraumatic, face symmetric, no Cushingoid features. EYES:  Blue eyes.  Pupils equal round and reactive to light and accomodation.  No conjunctivitis or scleral icterus. ENT:  Oropharynx clear without lesion.  Tongue normal. Mucous membranes moist.  RESPIRATORY:  Clear to auscultation without rales, wheezes or rhonchi. CARDIOVASCULAR:  Regular rate and rhythm without murmur, rub or gallop. ABDOMEN:  Soft, non-tender, with active bowel sounds, and no appreciable hepatosplenomegaly.  No masses. SKIN:  No rashes, ulcers or lesions. EXTREMITIES: No edema, no skin discoloration or tenderness.  No palpable cords. LYMPH NODES: No palpable cervical, supraclavicular, axillary or inguinal adenopathy  NEUROLOGICAL: Unremarkable. PSYCH:  Appropriate.    Appointment on 07/26/2015  Component Date Value Ref Range Status  . WBC 07/26/2015 7.3  3.8 - 10.6 K/uL Final  . RBC 07/26/2015 3.89* 4.40 - 5.90 MIL/uL Final  . Hemoglobin 07/26/2015 11.6* 13.0 - 18.0 g/dL Final  . HCT 07/26/2015  33.7* 40.0 - 52.0 % Final  . MCV 07/26/2015 86.6  80.0 - 100.0 fL Final  . MCH 07/26/2015 29.8  26.0 - 34.0 pg Final  . MCHC 07/26/2015 34.4  32.0 - 36.0 g/dL Final  . RDW 07/26/2015 16.7* 11.5 - 14.5 % Final  . Platelets 07/26/2015 105* 150 - 440 K/uL Final  . Neutrophils Relative % 07/26/2015 61   Final  . Neutro Abs 07/26/2015 4.4  1.4 - 6.5 K/uL Final  . Lymphocytes Relative 07/26/2015 20   Final  . Lymphs Abs 07/26/2015 1.5  1.0 - 3.6 K/uL Final  . Monocytes Relative 07/26/2015 11   Final  . Monocytes Absolute 07/26/2015 0.8  0.2 - 1.0 K/uL Final  . Eosinophils Relative 07/26/2015 7   Final  . Eosinophils Absolute 07/26/2015 0.5  0 - 0.7 K/uL Final  . Basophils Relative 07/26/2015 1   Final  . Basophils Absolute 07/26/2015 0.1  0 - 0.1 K/uL Final    Assessment:  Zachary Buck is a 75 y.o. male with iron deficiency anemia and stage II multiple myeloma. 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 Serum immunoglobulins noted an IgG of 2930 (high). 24-hour urine revealed a 8.8% monoclonal protein (10.6 mg/24 hours). Bone survey on 10/20/2014 revealed no lytic lesions. Albumen was 3.3 on 08/26/2014. He had pneumonia in 05/2014.   SPEP was 2.3 g/dL on 10/09/2014, 01/18/2015, 03/22/2015, and 06/18/2015.  Kappa free light chains were 23.99, lambda free light chains 155.3, and a ratio of 0.15 (low) on 01/18/2015.  Kappa free light chains were 27.63, lambda free light chains 174.59, and a ratio of 0.16 on 06/18/2015.  Bone marrow biopsy on 11/10/2014 revealed a 10% monoclonal plasma cell infiltrate. Marrow was hypercellular for age (40-50%) with mixed maturing hematopoiesis, relative erythroid hyperplasia and mild nonspecific dyserythropoiesis. There was patchy mild focally moderate increase in reticulin. There were no significant iron stores.  Myeloma FISH panel revealed translocation (11;14) which results in fusion of CCND1 (BCL1) at 11q13 with the immunoglobulin heavy chain gene (IgH) at 14q32 (a favorable prognostic feature). Beta2 microglobulin was 4.2 on 11/24/2014 and 3.8 on 06/18/2015.  PET scan on 12/07/2014 revealed no hypermetabolic foci within the marrow space or nodal stations to suggest active multiple myeloma. There was hypermetabolism within the right lower lobe, corresponding to a similar dependent density. Morphology favored scarring.There was cirrhosis and portal hypertension. There was gastric body hypermetabolism favoring physiologic. Spleen was 15.3 cm (volume 1065 cc).  There was pulmonary artery enlargement suggesting pulmonary artery hypertension. 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  He has a history of asbestos exposure in the [employer].  Abdominal CT scan on 03/16/2015 revealed splenomegaly (14.5 cm), cirrhosis, and stigmata of portal hypertension with esophageal and gastric varices.  There were no liver lesions.  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  There were no pleural effusions.  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  There was no frank honeycombing. This finding accounted for the medial right lower lobe hypermetabolism on the 12/07/2014 PET-CT.  There was cirrhosis, splenomegaly, and prominent gastroesophageal varices.  He has had mild anemia since 10/2013 and mild thrombocytopenia since 05/2014. Colonoscopy in 10/2013 revealed polyps. EGD revealed gastritis. Capsule enteroscopy on 11/27/2014 revealed 1-2 gastric polyps and one colon polyp.  There were no findings to explain iron deficiency anemia.  His diet was good. Labs confirmed iron deficiency (ferritin  11.8 on 05/2014 and 13 on 10/09/2014). B12 was low normal (MMA normal) thus ruling out B12 deficiency.  Colonoscopy on 03/04/2015 revealed a sessile polyp was seen in the transverse colon (tubular adenoma). EGD on 03/04/2015 revealed medium-sized esophageal varices in the mid and distal esophagus without bleeding. There was a sessile polyp in the gastric antrum (hyperplastic polyp).  There were 3 small angiodysplastic lesions in the second part of the duodenum.  It was recommended that he continue Protonix.  He is on nadolol.  He was told that the etiology of his cirrhosis was fatty liver.  He stopped his Protonix secondary to pain in his stomach. He stopped his pancreatic enzymes. He notes diarrhea for a long time (6 months to 2 years).  Diarrhea has improved on Enterogam (began 06/22/2015).  He has recurrent iron deficiency anemia.  He received 200 mg Venofer weekly 3 (03/26/2015 - 04/14/2015) and weekly x 3 (06/25/2015 - 07/09/2015).  Ferritin was 23 (low) on 06/18/2015 and 121 on 07/26/2015.   He has been taking oral iron 3 times a day with vitamin C.  Symptomatically, he feels weak. His hematocrit has improved (31.4 to 33.7).  He has had one episode of black stool.  Plan: 1.  Labs today:  CBC with diff, CMP, ferritin. 2.  Phone follow-up with patient regarding ferritin (goal 100). 3.  Continue Enterogam 1 packet a day.  Rx provided.   4.  RTC on 09/13/2015 for MD assess, labs (CBC with diff, CMP, ferritin, FLCA, SPEP, beta2-microglobulin).  Addendum:  Patient contacted regarding normal ferritin (121).  He was instructed to discontinue oral iron.  He was contacted regarding his elevated BUN and creatinine (41/1.39 compared to 25/1.15 on 06/18/2015).  Fluids were encouraged.  Would watch calcium (typically 9.4-9.7).  Anticipate follow-up electrolytes with Dr. Einar Pheasant or our clinic to ensure improvement.  Lequita Asal, MD  07/26/2015, 10:09 AM

## 2015-07-26 NOTE — Telephone Encounter (Signed)
Called and spoke to wife and gave her ferritin 121 and he does not need any more venofer right now. Wife happy with results.

## 2015-07-26 NOTE — Progress Notes (Signed)
States continues to feel weak and tired today. Has noticed slight improvement after first iron infusion.

## 2015-07-27 ENCOUNTER — Telehealth: Payer: Self-pay | Admitting: Internal Medicine

## 2015-07-27 ENCOUNTER — Encounter: Payer: Self-pay | Admitting: Hematology and Oncology

## 2015-07-27 NOTE — Telephone Encounter (Signed)
CMA spoke with Patient and wife. thanks

## 2015-07-27 NOTE — Telephone Encounter (Signed)
Caller name: Londen Toler Relationship to patient: patient Can be [phone] Reason for call: left message stating that he was returning CMA's call

## 2015-08-02 ENCOUNTER — Other Ambulatory Visit (INDEPENDENT_AMBULATORY_CARE_PROVIDER_SITE_OTHER): Payer: PPO

## 2015-08-02 DIAGNOSIS — E7800 Pure hypercholesterolemia, unspecified: Secondary | ICD-10-CM

## 2015-08-02 DIAGNOSIS — E119 Type 2 diabetes mellitus without complications: Secondary | ICD-10-CM | POA: Diagnosis not present

## 2015-08-02 LAB — HEPATIC FUNCTION PANEL
ALBUMIN: 3.6 g/dL (ref 3.5–5.2)
ALT: 22 U/L (ref 0–53)
AST: 27 U/L (ref 0–37)
Alkaline Phosphatase: 65 U/L (ref 39–117)
Bilirubin, Direct: 0.2 mg/dL (ref 0.0–0.3)
TOTAL PROTEIN: 7.4 g/dL (ref 6.0–8.3)
Total Bilirubin: 0.6 mg/dL (ref 0.2–1.2)

## 2015-08-02 LAB — BASIC METABOLIC PANEL
BUN: 25 mg/dL — AB (ref 6–23)
CALCIUM: 9.7 mg/dL (ref 8.4–10.5)
CO2: 22 mEq/L (ref 19–32)
CREATININE: 1.34 mg/dL (ref 0.40–1.50)
Chloride: 104 mEq/L (ref 96–112)
GFR: 55.16 mL/min — AB (ref >60.00)
Glucose, Bld: 213 mg/dL — ABNORMAL HIGH (ref 70–99)
Potassium: 4.9 mEq/L (ref 3.5–5.1)
Sodium: 133 mEq/L — ABNORMAL LOW (ref 135–145)

## 2015-08-02 LAB — HEMOGLOBIN A1C: HEMOGLOBIN A1C: 7.5 % — AB (ref 4.6–6.5)

## 2015-08-02 LAB — LIPID PANEL
CHOLESTEROL: 197 mg/dL (ref 0–200)
HDL: 16.7 mg/dL — ABNORMAL LOW (ref >39.00)
Total CHOL/HDL Ratio: 12
Triglycerides: 1679 mg/dL — ABNORMAL HIGH (ref 0.0–149.0)

## 2015-08-02 LAB — LDL CHOLESTEROL, DIRECT: Direct LDL: 44 mg/dL

## 2015-08-03 ENCOUNTER — Other Ambulatory Visit: Payer: Self-pay | Admitting: Internal Medicine

## 2015-08-03 ENCOUNTER — Encounter: Payer: Self-pay | Admitting: Internal Medicine

## 2015-08-03 ENCOUNTER — Encounter: Payer: Self-pay | Admitting: *Deleted

## 2015-08-03 DIAGNOSIS — R7989 Other specified abnormal findings of blood chemistry: Secondary | ICD-10-CM

## 2015-08-03 DIAGNOSIS — E871 Hypo-osmolality and hyponatremia: Secondary | ICD-10-CM

## 2015-08-03 NOTE — Progress Notes (Signed)
Order placed for f/u lab.   

## 2015-08-12 ENCOUNTER — Other Ambulatory Visit: Payer: Self-pay | Admitting: Internal Medicine

## 2015-08-25 ENCOUNTER — Other Ambulatory Visit (INDEPENDENT_AMBULATORY_CARE_PROVIDER_SITE_OTHER): Payer: PPO

## 2015-08-25 ENCOUNTER — Encounter: Payer: Self-pay | Admitting: Internal Medicine

## 2015-08-25 ENCOUNTER — Ambulatory Visit (INDEPENDENT_AMBULATORY_CARE_PROVIDER_SITE_OTHER): Payer: PPO | Admitting: Internal Medicine

## 2015-08-25 VITALS — BP 162/74 | HR 60 | Ht 69.0 in | Wt 177.6 lb

## 2015-08-25 DIAGNOSIS — K529 Noninfective gastroenteritis and colitis, unspecified: Secondary | ICD-10-CM

## 2015-08-25 DIAGNOSIS — D509 Iron deficiency anemia, unspecified: Secondary | ICD-10-CM

## 2015-08-25 DIAGNOSIS — K7460 Unspecified cirrhosis of liver: Secondary | ICD-10-CM | POA: Diagnosis not present

## 2015-08-25 DIAGNOSIS — N529 Male erectile dysfunction, unspecified: Secondary | ICD-10-CM

## 2015-08-25 DIAGNOSIS — I8500 Esophageal varices without bleeding: Secondary | ICD-10-CM

## 2015-08-25 DIAGNOSIS — K766 Portal hypertension: Secondary | ICD-10-CM

## 2015-08-25 LAB — COMPREHENSIVE METABOLIC PANEL
ALBUMIN: 3.5 g/dL (ref 3.5–5.2)
ALK PHOS: 59 U/L (ref 39–117)
ALT: 31 U/L (ref 0–53)
AST: 31 U/L (ref 0–37)
BUN: 24 mg/dL — AB (ref 6–23)
CO2: 27 mEq/L (ref 19–32)
CREATININE: 1.33 mg/dL (ref 0.40–1.50)
Calcium: 9.8 mg/dL (ref 8.4–10.5)
Chloride: 107 mEq/L (ref 96–112)
GFR: 55.63 mL/min — ABNORMAL LOW (ref >60.00)
Glucose, Bld: 269 mg/dL — ABNORMAL HIGH (ref 70–99)
Potassium: 4.8 mEq/L (ref 3.5–5.1)
SODIUM: 137 meq/L (ref 135–145)
TOTAL PROTEIN: 8 g/dL (ref 6.0–8.3)
Total Bilirubin: 0.8 mg/dL (ref 0.2–1.2)

## 2015-08-25 LAB — CBC WITH DIFFERENTIAL/PLATELET
BASOS PCT: 0.4 % (ref 0.0–3.0)
Basophils Absolute: 0 10*3/uL (ref 0.0–0.1)
EOS ABS: 0.3 10*3/uL (ref 0.0–0.7)
EOS PCT: 5.3 % — AB (ref 0.0–5.0)
HCT: 31.9 % — ABNORMAL LOW (ref 39.0–52.0)
HEMOGLOBIN: 10.9 g/dL — AB (ref 13.0–17.0)
LYMPHS PCT: 24.3 % (ref 12.0–46.0)
Lymphs Abs: 1.3 10*3/uL (ref 0.7–4.0)
MCHC: 34.3 g/dL (ref 30.0–36.0)
MCV: 87 fl (ref 78.0–100.0)
Monocytes Absolute: 0.7 10*3/uL (ref 0.1–1.0)
Monocytes Relative: 12.5 % — ABNORMAL HIGH (ref 3.0–12.0)
Neutro Abs: 3 10*3/uL (ref 1.4–7.7)
Neutrophils Relative %: 57.5 % (ref 43.0–77.0)
Platelets: 112 10*3/uL — ABNORMAL LOW (ref 150.0–400.0)
RBC: 3.66 Mil/uL — AB (ref 4.22–5.81)
RDW: 16 % — ABNORMAL HIGH (ref 11.5–15.5)
WBC: 5.3 10*3/uL (ref 4.0–10.5)

## 2015-08-25 LAB — PROTIME-INR
INR: 1.1 ratio — AB (ref 0.8–1.0)
Prothrombin Time: 11.3 s (ref 9.6–13.1)

## 2015-08-25 NOTE — Progress Notes (Signed)
Subjective:    Patient ID: Zachary Buck, male    DOB: [birth year], 75 y.o.   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 He is here with his wife. Last seen in March 2017.    On the whole he reports that he is doing well. He received 3 doses of IV iron in May 2017. His fatigue is improved. His diarrhea has also resolved. He used Imodium on a daily basis with good result. Recently his wife has stopped giving this daily to see if he needs it. He has on and off issues with stomach discomfort but this is more rare now. 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 No blood in his stool or melena.  His wife has more questions regarding the nadolol. He has continued nadolol despite calling in and requesting we switched to propranolol. He never did make the switch. He's been using three quarters of a 20 mg nadolol tab daily. His wife is been checking his pulse 3 times a day regardless of his activity. He's been mowing the lawn and when his pulse comes above 60 she gets nervous. He has been able to mow the lawn a complete more tasks without as much fatigue recently. He occasionally gets some dizziness with standing. No falling.  His wife is also concerned about slight forgetfulness, occasional slurring of words, occasional stuttering and decreased in hearing. She states these seem dramatic to her but she tends to "overexaggerate things". She has placed him on a milk thistle product which she gets on line. He takes this twice a day. She read an article from an [place] doctor supporting the use of this medication and cirrhosis. Is not sure what else the tablet  contains.  He asks if he can continue to use sildenafil because he states that there is "snow on top but still fire in the furnace".  Review of Systems As per history of present illness, otherwise negative  Current Medications, Allergies, Past Medical History, Past Surgical History, Family History and Social History were reviewed in [employer] record.     Objective:   Physical Exam BP 162/74 mmHg  Pulse 60  Ht 5' 9"  (1.753 m)  Wt 177 lb 9.6 oz (80.559 kg)  BMI 26.22 kg/m2 Constitutional: Well-developed and well-nourished. No distress. HEENT: Normocephalic and atraumatic. Oropharynx is clear and moist. No oropharyngeal exudate. Conjunctivae are normal.  No scleral icterus. Neck: Neck supple. Trachea midline. Cardiovascular: Normal rate, regular rhythm and intact distal pulses. No M/R/G Pulmonary/chest: Effort normal and breath sounds normal. No wheezing, rales or rhonchi. Abdominal: Soft, Diffuse tenderness without rebound or guarding, tenderness is mild, nondistended. Bowel sounds active throughout. There are no masses palpable. Extremities: no clubbing, cyanosis, or edema Lymphadenopathy: No cervical adenopathy noted. Neurological: Alert and oriented to person place and time. No asterixis Skin: Skin is warm and dry. No rashes noted. Psychiatric: Normal mood and affect. Behavior is normal.  CBC    Component Value Date/Time   WBC 7.3 07/26/2015 0925   WBC 6.0 11/18/2013 1053   RBC 3.89* 07/26/2015 0925   RBC 4.24* 10/09/2014 1056   RBC 4.80 11/18/2013 1053   HGB 11.6* 07/26/2015 0925   HGB  13.1 11/18/2013 1053   HCT 33.7* 07/26/2015 0925   HCT 40.4 11/18/2013 1053   PLT 105* 07/26/2015 0925   PLT 152 11/18/2013 1053   MCV 86.6 07/26/2015 0925   MCV 84 11/18/2013 1053   MCH 29.8 07/26/2015 0925   MCH 27.3 11/18/2013 1053   MCHC 34.4 07/26/2015 0925   MCHC 32.5 11/18/2013 1053   RDW 16.7* 07/26/2015 0925   RDW 14.9* 11/18/2013 1053   LYMPHSABS  1.5 07/26/2015 0925   MONOABS 0.8 07/26/2015 0925   EOSABS 0.5 07/26/2015 0925   BASOSABS 0.1 07/26/2015 0925    CMP     Component Value Date/Time   NA 133* 08/02/2015 1007   K 4.9 08/02/2015 1007   CL 104 08/02/2015 1007   CO2 22 08/02/2015 1007   GLUCOSE 213* 08/02/2015 1007   BUN 25* 08/02/2015 1007   CREATININE 1.34 08/02/2015 1007   CREATININE 1.43* 08/30/2012 1615   CALCIUM 9.7 08/02/2015 1007   PROT 7.4 08/02/2015 1007   PROT 8.3* 11/18/2013 1053   ALBUMIN 3.6 08/02/2015 1007   ALBUMIN 3.3* 11/18/2013 1053   AST 27 08/02/2015 1007   AST 29 11/18/2013 1053   ALT 22 08/02/2015 1007   ALT 36 11/18/2013 1053   ALKPHOS 65 08/02/2015 1007   ALKPHOS 58 11/18/2013 1053   BILITOT 0.6 08/02/2015 1007   BILITOT 0.6 11/18/2013 1053   GFRNONAA 48* 07/26/2015 0925   GFRNONAA 49* 08/30/2012 1615   GFRAA 56* 07/26/2015 0925   GFRAA 56* 08/30/2012 1615    Lab Results  Component Value Date   INR 1.11 03/04/2015   INR 0.99 11/10/2014   INR 1.08 11/02/2014   Iron/TIBC/Ferritin/ %Sat    Component Value Date/Time   IRON 46 10/09/2014 1056   TIBC 370 10/09/2014 1056   FERRITIN 121 07/26/2015 0925   IRONPCTSAT 12* 10/09/2014 1056       Assessment & Plan:  75 year old male with a past medical history of 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  1. Cryptogenic cirrhosis with portal hypertension and esophageal varices -- overall cirrhosis remains clinically well compensated. He's had no issues with ascites, lower extremity edema, or bleeding. Question of subtle encephalopathy versus medication related side effect. His wife is convinced that his slight forgetfulness, slight sleepiness, and occasional issues with word selection and stuttering is related to nadolol. Differential  includes beta blocker side effect versus very mild hepatic encephalopathy. Will decrease nadolol to 10 mg daily. Currently resting heart rate is 60 with dose at 15 mg daily. I feel strongly that he needs beta blocker given his history of esophageal varices for prophylaxis of bleeding. She will check resting heart rate after decreasing the dose and if it increases above 60 when need to go back to 15 mg daily (time spent today explaining that we are targeting resting heart rate and not heart rate with activity. Heart rate would be expected to appropriately increased with activity). --Esophageal varices -- on beta blocker as above --HCC screening -- no lesions by CT January 2017, repeat January 2018 --Hepatic encephalopathy -- as above. Certainly not overt. If no improvement in above-mentioned symptoms with beta blocker reduction, consider trial of empiric rifaximin. Given his history of diarrhea I would not expect him to respond favorably to lactulose --Low sodium diet  2. Chronic diarrhea -- improved. Continue as  needed Imodium 2 mg daily. 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  3. Upper abdominal pain -- resolved. Currently off of PPI  4. IDA -- followed by Dr. Mike Gip and received IV iron in May 2017. Last ferritin was normal. Hemoglobin has improved appropriately  5. Erectile dysfunction -- okay for Viagra from a liver standpoint. He does not use nitrates.  45 minutes spent with the patient today. Greater than 50% was spent in counseling and coordination of care with the patient

## 2015-08-25 NOTE — Patient Instructions (Signed)
Your physician has requested that you go to the basement for the following lab work before leaving today: CBC, CMP, INR  Please purchase the following medications over the counter and take as directed: Imodium as needed  Decrease Nadolol to 10 mg daily for now.   Monitor your resting heart rate. Target heart rate is 60 bpm. If heart rate is higher than 60 at rest, please let us know. We may need to increase your dosage of nadolol again.  Follow up with Dr Hilarie Fredrickson in 3-4 months.  If you are age 54 or older, your body mass index should be between 23-30. Your Body mass index is 26.22 kg/(m^2). If this is out of the aforementioned range listed, please consider follow up with your Primary Care Provider.  If you are age 67 or younger, your body mass index should be between 19-25. Your Body mass index is 26.22 kg/(m^2). If this is out of the aformentioned range listed, please consider follow up with your Primary Care Provider.

## 2015-09-01 DIAGNOSIS — R972 Elevated prostate specific antigen [PSA]: Secondary | ICD-10-CM | POA: Diagnosis not present

## 2015-09-01 DIAGNOSIS — R339 Retention of urine, unspecified: Secondary | ICD-10-CM | POA: Insufficient documentation

## 2015-09-01 DIAGNOSIS — N138 Other obstructive and reflux uropathy: Secondary | ICD-10-CM | POA: Diagnosis not present

## 2015-09-01 DIAGNOSIS — N200 Calculus of kidney: Secondary | ICD-10-CM | POA: Diagnosis not present

## 2015-09-01 DIAGNOSIS — N401 Enlarged prostate with lower urinary tract symptoms: Secondary | ICD-10-CM | POA: Diagnosis not present

## 2015-09-06 ENCOUNTER — Encounter: Payer: Self-pay | Admitting: Internal Medicine

## 2015-09-08 ENCOUNTER — Other Ambulatory Visit: Payer: PPO

## 2015-09-09 ENCOUNTER — Other Ambulatory Visit: Payer: Self-pay

## 2015-09-09 MED ORDER — BLOOD GLUCOSE MONITOR KIT: PACK | Status: DC

## 2015-09-09 MED ORDER — BLOOD GLUCOSE MONITOR KIT
PACK | Status: AC
Start: 2015-09-09 — End: ?

## 2015-09-13 ENCOUNTER — Telehealth: Payer: Self-pay | Admitting: *Deleted

## 2015-09-13 ENCOUNTER — Inpatient Hospital Stay: Payer: PPO | Attending: Hematology and Oncology

## 2015-09-13 ENCOUNTER — Inpatient Hospital Stay (HOSPITAL_BASED_OUTPATIENT_CLINIC_OR_DEPARTMENT_OTHER): Payer: PPO | Admitting: Hematology and Oncology

## 2015-09-13 VITALS — BP 128/75 | HR 66 | Temp 98.1°F | Resp 18 | Wt 179.8 lb

## 2015-09-13 DIAGNOSIS — C9000 Multiple myeloma not having achieved remission: Secondary | ICD-10-CM | POA: Insufficient documentation

## 2015-09-13 DIAGNOSIS — K760 Fatty (change of) liver, not elsewhere classified: Secondary | ICD-10-CM | POA: Insufficient documentation

## 2015-09-13 DIAGNOSIS — Z86010 Personal history of colonic polyps: Secondary | ICD-10-CM | POA: Diagnosis not present

## 2015-09-13 DIAGNOSIS — R197 Diarrhea, unspecified: Secondary | ICD-10-CM

## 2015-09-13 DIAGNOSIS — I864 Gastric varices: Secondary | ICD-10-CM | POA: Insufficient documentation

## 2015-09-13 DIAGNOSIS — J849 Interstitial pulmonary disease, unspecified: Secondary | ICD-10-CM | POA: Diagnosis not present

## 2015-09-13 DIAGNOSIS — K766 Portal hypertension: Secondary | ICD-10-CM | POA: Insufficient documentation

## 2015-09-13 DIAGNOSIS — E119 Type 2 diabetes mellitus without complications: Secondary | ICD-10-CM | POA: Insufficient documentation

## 2015-09-13 DIAGNOSIS — I252 Old myocardial infarction: Secondary | ICD-10-CM | POA: Diagnosis not present

## 2015-09-13 DIAGNOSIS — I10 Essential (primary) hypertension: Secondary | ICD-10-CM | POA: Diagnosis not present

## 2015-09-13 DIAGNOSIS — I2510 Atherosclerotic heart disease of native coronary artery without angina pectoris: Secondary | ICD-10-CM | POA: Diagnosis not present

## 2015-09-13 DIAGNOSIS — R161 Splenomegaly, not elsewhere classified: Secondary | ICD-10-CM | POA: Insufficient documentation

## 2015-09-13 DIAGNOSIS — Z7984 Long term (current) use of oral hypoglycemic drugs: Secondary | ICD-10-CM | POA: Diagnosis not present

## 2015-09-13 DIAGNOSIS — E782 Mixed hyperlipidemia: Secondary | ICD-10-CM | POA: Diagnosis not present

## 2015-09-13 DIAGNOSIS — N400 Enlarged prostate without lower urinary tract symptoms: Secondary | ICD-10-CM | POA: Diagnosis not present

## 2015-09-13 DIAGNOSIS — K648 Other hemorrhoids: Secondary | ICD-10-CM | POA: Diagnosis not present

## 2015-09-13 DIAGNOSIS — K7469 Other cirrhosis of liver: Secondary | ICD-10-CM | POA: Insufficient documentation

## 2015-09-13 DIAGNOSIS — D509 Iron deficiency anemia, unspecified: Secondary | ICD-10-CM | POA: Insufficient documentation

## 2015-09-13 DIAGNOSIS — Z79899 Other long term (current) drug therapy: Secondary | ICD-10-CM

## 2015-09-13 LAB — COMPREHENSIVE METABOLIC PANEL
ALT: 31 U/L (ref 17–63)
AST: 31 U/L (ref 15–41)
Albumin: 3.3 g/dL — ABNORMAL LOW (ref 3.5–5.0)
Alkaline Phosphatase: 67 U/L (ref 38–126)
Anion gap: 6 (ref 5–15)
BUN: 29 mg/dL — ABNORMAL HIGH (ref 6–20)
CO2: 20 mmol/L — ABNORMAL LOW (ref 22–32)
Calcium: 9.7 mg/dL (ref 8.9–10.3)
Chloride: 107 mmol/L (ref 101–111)
Creatinine, Ser: 1.29 mg/dL — ABNORMAL HIGH (ref 0.61–1.24)
GFR calc Af Amer: >60 mL/min (ref >60)
GFR calc non Af Amer: 53 mL/min — ABNORMAL LOW (ref >60)
Glucose, Bld: 257 mg/dL — ABNORMAL HIGH (ref 65–99)
Potassium: 4.5 mmol/L (ref 3.5–5.1)
Sodium: 133 mmol/L — ABNORMAL LOW (ref 135–145)
Total Bilirubin: 1 mg/dL (ref 0.3–1.2)
Total Protein: 8.1 g/dL (ref 6.5–8.1)

## 2015-09-13 LAB — CBC WITH DIFFERENTIAL/PLATELET
Basophils Absolute: 0 10*3/uL (ref 0–0.1)
Basophils Relative: 1 %
Eosinophils Absolute: 0.4 10*3/uL (ref 0–0.7)
Eosinophils Relative: 7 %
HCT: 30.3 % — ABNORMAL LOW (ref 40.0–52.0)
Hemoglobin: 10.4 g/dL — ABNORMAL LOW (ref 13.0–18.0)
Lymphocytes Relative: 22 %
Lymphs Abs: 1.2 10*3/uL (ref 1.0–3.6)
MCH: 30 pg (ref 26.0–34.0)
MCHC: 34.5 g/dL (ref 32.0–36.0)
MCV: 87.1 fL (ref 80.0–100.0)
Monocytes Absolute: 0.6 10*3/uL (ref 0.2–1.0)
Monocytes Relative: 11 %
Neutro Abs: 3.3 10*3/uL (ref 1.4–6.5)
Neutrophils Relative %: 59 %
Platelets: 111 10*3/uL — ABNORMAL LOW (ref 150–440)
RBC: 3.47 MIL/uL — ABNORMAL LOW (ref 4.40–5.90)
RDW: 15.9 % — ABNORMAL HIGH (ref 11.5–14.5)
WBC: 5.5 10*3/uL (ref 3.8–10.6)

## 2015-09-13 LAB — FERRITIN: Ferritin: 17 ng/mL — ABNORMAL LOW (ref 24–336)

## 2015-09-13 NOTE — Progress Notes (Signed)
Patient is here for follow up, no complaints today. He is here with his wife, would like to know his iron results.

## 2015-09-13 NOTE — Progress Notes (Signed)
[facility] day:  09/13/15  Chief Complaint: Zachary Buck is a 75 y.o. male  with stage II multiple myeloma and iron deficiency anemia who is seen for 1 month assessment.  HPI: The patient was last seen in the medical oncology clinic on 07/26/2015.  At that time, he felt weak. His hematocrit had improved (31.4 to 33.7).  He described one episode of black stool.  Ferritin was normal (121).  He was instructed to discontinue oral iron.  BUN and creatinine were elevated (41 and 1.39 compared to 25 and 1.15 on 06/18/2015).  Fluids were encouraged.    In the interim he notes "no problems".  He is using Imodium and Enterogam for his diarrhea.  's wife knows notes that the insurance wouldn't cover Enterogam (cost $500).  She was able to get the medication covered at $79.  He describes some abdominal pain with diarrhea.  His wife notes that he is also using "chromium mate"  Four weeks ago he saw a holistic doctor in [place].  He denies any melena or hematochezia.  Past Medical History:  Diagnosis Date  . Anemia   . Atherosclerotic heart disease    s/p MI, s/p stent mid circumflex  . CAD (coronary artery disease)   . Cirrhosis ([place])   . Clavicle fracture   . Diabetes mellitus without complication ([place])   . Esophageal varices ([place])   . Hypercholesterolemia   . Hypertension   . Internal hemorrhoids   . Leg fracture   . Myocardial infarct ([place])   . Prostate enlargement   . Tubular adenoma of colon     Past Surgical History:  Procedure Laterality Date  . COLONOSCOPY WITH PROPOFOL N/A 03/04/2015   Procedure: COLONOSCOPY WITH PROPOFOL;  Surgeon: Jerene Bears, MD;  Location: [facility];  Service: Gastroenterology;  Laterality: N/A;  . CORONARY ANGIOPLASTY WITH STENT PLACEMENT    . ESOPHAGOGASTRODUODENOSCOPY (EGD) WITH PROPOFOL N/A 03/04/2015   Procedure: ESOPHAGOGASTRODUODENOSCOPY (EGD) WITH PROPOFOL;  Surgeon: Jerene Bears, MD;   Location: [facility];  Service: Gastroenterology;  Laterality: N/A;  . TONSILLECTOMY      Family History  Problem Relation Age of Onset  . Aneurysm Father 74    of the heart  . Diabetes Mother   . Heart attack Father     Social History:  reports that he has never smoked. He has never used smokeless tobacco. He reports that he does not drink alcohol or use drugs.  Patient denies any exposure to radiation or toxins. The patient is accompanied by his wife, [name], today.  Allergies:  Allergies  Allergen Reactions  . Niaspan [Niacin Er] Other (See Comments)    Just does not want to take     Current Medications: Current Outpatient Prescriptions  Medication Sig Dispense Refill  . Alpha-Lipoic Acid (LIPOIC ACID PO) Take by mouth.    . Ascorbic Acid (VITAMIN C) 1000 MG tablet Take 1,000 mg by mouth daily.    . B Complex Vitamins (VITAMIN B COMPLEX) TABS Take by mouth daily.    . blood glucose meter kit and supplies KIT Dispense based on patient and insurance preference. Use up to four times daily as directed. (FOR ICD-9 250.00, 250.01). 1 each 0  . blood glucose meter kit and supplies KIT Dispense based on patient and insurance preference. Use up to four times daily as directed. E11.9 Please dispense Freestyle freedom meter and supplies. 1 each 0  . Cholecalciferol (VITAMIN D-3)  1000 UNITS CAPS Take by mouth.    . CHROMIUM PO Take by mouth.    . Cinnamon 500 MG capsule Take 500 mg by mouth.    . Coenzyme Q10 100 MG capsule Take by mouth.    Marland Kitchen glipiZIDE (GLUCOTROL) 5 MG tablet Use 5 mg in the morning and 10 mg in the evening. 270 tablet 3  . glucose blood (BAYER CONTOUR NEXT TEST) test strip Contour Next EZ test strips: check blood sugars twice a day. Dx: 250.00    . lisinopril (PRINIVIL,ZESTRIL) 20 MG tablet TAKE ONE TABLET BY MOUTH EVERY DAY    . Magnesium 500 MG CAPS Take 500 mg by mouth daily.    . metFORMIN (GLUCOPHAGE) 1000 MG tablet TAKE 1 TABLET(1000 MG) BY MOUTH TWICE DAILY  WITH A MEAL 60 tablet 5  . nadolol (CORGARD) 20 MG tablet Take 1 tablet (20 mg total) by mouth daily. (Patient taking differently: Take 10 mg by mouth daily. ) 30 tablet 11  . Omega-3 Fatty Acids (FISH OIL) 1000 MG CAPS Take 4,000 mg by mouth daily.     . SBI/Protein Isolate (ENTERAGAM) 5 g PACK Take 1 packet by mouth daily. (Patient taking differently: Take 1 packet by mouth as needed. ) 30 each 3  . triamcinolone cream (KENALOG) 0.1 % Apply topically 2 (two) times daily. Please avoid face and genitalia area.  Do not use in the same spot for more than 7-10 days in a row. 30 g 0  . Turmeric POWD 4,000 mg by Does not apply route daily.    . vitamin B-12 (CYANOCOBALAMIN) 100 MCG tablet Take 200 mcg by mouth daily.    Marland Kitchen FERROUS SULFATE PO Take 2 tablets by mouth daily.     Marland Kitchen glucose blood (ONE TOUCH ULTRA TEST) test strip Use as instructed (Patient not taking: Reported on 09/13/2015) 100 each 12  . Lancets (ONETOUCH ULTRASOFT) lancets Use as instructed (Patient not taking: Reported on 09/13/2015) 100 each 12  . sildenafil (REVATIO) 20 MG tablet 3 to 5 tablets per day or as instructed    . sildenafil (VIAGRA) 25 MG tablet Take 25 mg by mouth as needed for erectile dysfunction.     No current facility-administered medications for this visit.    Facility-Administered Medications Ordered in Other Visits  Medication Dose Route Frequency Provider Last Rate Last Dose  . alteplase (CATHFLO ACTIVASE) injection 2 mg  2 mg Intracatheter Once PRN Lequita Asal, MD      . heparin lock flush 100 unit/mL  500 Units Intracatheter Once PRN Lequita Asal, MD      . heparin lock flush 100 unit/mL  250 Units Intracatheter Once PRN Lequita Asal, MD      . sodium chloride 0.9 % injection 10 mL  10 mL Intracatheter PRN Lequita Asal, MD      . sodium chloride 0.9 % injection 3 mL  3 mL Intravenous Once PRN Lequita Asal, MD        Review of Systems:  GENERAL:  "No problems".  No fevers or  sweats.  Weight up 4 pounds since last visit. PERFORMANCE STATUS (ECOG):  1 HEENT:  No visual changes, runny nose, sore throat, mouth sores or tenderness. Lungs: No shortness of breath or cough.  No hemoptysis. Cardiac:  No chest pain, palpitations, orthopnea, or PND. GI:  Diarrhea managed with Imodium and Enterogam.  No vomiting or constipation. GU:  No urgency, frequency, dysuria, or hematuria. Musculoskeletal:  No back  pain.  No joint pain.  No muscle tenderness. Extremities:  No pain or swelling. Skin:  No rashes or skin changes. Neuro:  No headache, numbness or weakness, balance or coordination issues. Endocrine:  Diabetes.  No thyroid issues, hot flashes or night sweats. Psych:  No mood changes, depression or anxiety. Pain:  No focal pain. Review of systems:  All other systems reviewed and found to be negative.  Physical Exam: BP 128/75 (BP Location: Right Arm, Patient Position: Sitting)   Pulse 66   Temp 98.1 F (36.7 C) (Tympanic)   Resp 18   Wt 179 lb 12.6 oz (81.5 kg)   BMI 26.55 kg/m   GENERAL:  Well developed, well nourished, gentleman sitting comfortably in the exam room in no acute distress. MENTAL STATUS:  Alert and oriented to person, place and time. HEAD:  Pearline Cables hair.  Normocephalic, atraumatic, face symmetric, no Cushingoid features. EYES:  Blue eyes.  Pupils equal round and reactive to light and accomodation.  No conjunctivitis or scleral icterus. ENT:  Oropharynx clear without lesion.  Tongue normal. Mucous membranes moist.  RESPIRATORY:  Clear to auscultation without rales, wheezes or rhonchi. CARDIOVASCULAR:  Regular rate and rhythm without murmur, rub or gallop. ABDOMEN:  Soft, non-tender, with active bowel sounds, and no appreciable hepatosplenomegaly.  No masses. SKIN:  No rashes, ulcers or lesions. EXTREMITIES: No edema, no skin discoloration or tenderness.  No palpable cords. LYMPH NODES: No palpable cervical, supraclavicular, axillary or inguinal  adenopathy  NEUROLOGICAL: Unremarkable. PSYCH:  Appropriate.    Appointment on 09/13/2015  Component Date Value Ref Range Status  . WBC 09/13/2015 5.5  3.8 - 10.6 K/uL Final  . RBC 09/13/2015 3.47* 4.40 - 5.90 MIL/uL Final  . Hemoglobin 09/13/2015 10.4* 13.0 - 18.0 g/dL Final  . HCT 09/13/2015 30.3* 40.0 - 52.0 % Final  . MCV 09/13/2015 87.1  80.0 - 100.0 fL Final  . MCH 09/13/2015 30.0  26.0 - 34.0 pg Final  . MCHC 09/13/2015 34.5  32.0 - 36.0 g/dL Final  . RDW 09/13/2015 15.9* 11.5 - 14.5 % Final  . Platelets 09/13/2015 111* 150 - 440 K/uL Final  . Neutrophils Relative % 09/13/2015 59  % Final  . Neutro Abs 09/13/2015 3.3  1.4 - 6.5 K/uL Final  . Lymphocytes Relative 09/13/2015 22  % Final  . Lymphs Abs 09/13/2015 1.2  1.0 - 3.6 K/uL Final  . Monocytes Relative 09/13/2015 11  % Final  . Monocytes Absolute 09/13/2015 0.6  0.2 - 1.0 K/uL Final  . Eosinophils Relative 09/13/2015 7  % Final  . Eosinophils Absolute 09/13/2015 0.4  0 - 0.7 K/uL Final  . Basophils Relative 09/13/2015 1  % Final  . Basophils Absolute 09/13/2015 0.0  0 - 0.1 K/uL Final  . Sodium 09/13/2015 133* 135 - 145 mmol/L Final  . Potassium 09/13/2015 4.5  3.5 - 5.1 mmol/L Final  . Chloride 09/13/2015 107  101 - 111 mmol/L Final  . CO2 09/13/2015 20* 22 - 32 mmol/L Final  . Glucose, Bld 09/13/2015 257* 65 - 99 mg/dL Final  . BUN 09/13/2015 29* 6 - 20 mg/dL Final  . Creatinine, Ser 09/13/2015 1.29* 0.61 - 1.24 mg/dL Final  . Calcium 09/13/2015 9.7  8.9 - 10.3 mg/dL Final  . Total Protein 09/13/2015 8.1  6.5 - 8.1 g/dL Final  . Albumin 09/13/2015 3.3* 3.5 - 5.0 g/dL Final  . AST 09/13/2015 31  15 - 41 U/L Final  . ALT 09/13/2015 31  17 - 63  U/L Final  . Alkaline Phosphatase 09/13/2015 67  38 - 126 U/L Final  . Total Bilirubin 09/13/2015 1.0  0.3 - 1.2 mg/dL Final  . GFR calc non Af Amer 09/13/2015 53* >60 mL/min Final  . GFR calc Af Amer 09/13/2015 >60  >60 mL/min Final   Comment: (NOTE) The eGFR has been  calculated using the CKD EPI equation. This calculation has not been validated in all clinical situations. eGFR's persistently <60 mL/min signify possible Chronic Kidney Disease.   Georgiann Hahn gap 09/13/2015 6  5 - 15 Final    Assessment:  DEDRIC ETHINGTON is a 75 y.o. male with iron deficiency anemia and stage II multiple myeloma. 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 Serum immunoglobulins noted an IgG of 2930 (high). 24-hour urine revealed a 8.8% monoclonal protein (10.6 mg/24 hours). Bone survey on 10/20/2014 revealed no lytic lesions. Albumen was 3.3 on 08/26/2014. He had pneumonia in 05/2014.   SPEP was 2.3 g/dL on 10/09/2014, 01/18/2015, 03/22/2015, 06/18/2015, and 09/13/2015.  Lambda free light chains were 155.3 (ratio 0.15) on 01/18/2015, 174.59 (ratio of 0.16) on 06/18/2015, and 182.9 (ratio of 0.13) on 09/13/2015.  Bone marrow biopsy on 11/10/2014 revealed a 10% monoclonal plasma cell infiltrate. Marrow was hypercellular for age (40-50%) with mixed maturing hematopoiesis, relative erythroid hyperplasia and mild nonspecific dyserythropoiesis. There was patchy mild focally moderate increase in reticulin. There were no significant iron stores. Myeloma FISH panel revealed translocation (11;14) which results in fusion of CCND1 (BCL1) at 11q13 with the immunoglobulin heavy chain gene (IgH) at 14q32 (a favorable prognostic feature). Beta2 microglobulin was 4.2 on 11/24/2014 and 3.8 on 06/18/2015.  PET scan on 12/07/2014 revealed no hypermetabolic foci within the marrow space or nodal stations to suggest active multiple myeloma. There was hypermetabolism within the right lower lobe, corresponding to a similar dependent density. Morphology favored scarring.There was cirrhosis and portal hypertension. There was gastric body hypermetabolism favoring  physiologic. Spleen was 15.3 cm (volume 1065 cc).  There was pulmonary artery enlargement suggesting pulmonary artery hypertension. 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  He has a history of asbestos exposure in the [employer].  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  Abdominal CT scan on 03/16/2015 revealed splenomegaly (14.5 cm), cirrhosis, and stigmata of portal hypertension with esophageal and gastric varices.  There were no liver lesions.  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  There were no pleural effusions.  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  There was no frank honeycombing. This finding accounted for the medial right lower lobe hypermetabolism on the 12/07/2014 PET-CT.  There was cirrhosis, splenomegaly, and prominent gastroesophageal varices.  He has had mild anemia since 10/2013 and mild thrombocytopenia since 05/2014. Colonoscopy in 10/2013 revealed polyps. EGD revealed gastritis. Capsule enteroscopy on 11/27/2014 revealed 1-2 gastric polyps and one colon polyp.  There were no findings to explain iron deficiency anemia.  His diet was good. Labs confirmed iron deficiency (ferritin 11.8 on 05/2014 and 13 on 10/09/2014). B12 was low normal (MMA normal) thus ruling out B12 deficiency.  Colonoscopy on 03/04/2015 revealed a sessile polyp was seen in the transverse colon (tubular adenoma). EGD on 03/04/2015 revealed medium-sized esophageal varices in the mid  and distal esophagus without bleeding. There was a sessile polyp in the gastric antrum (hyperplastic polyp).  There were 3 small angiodysplastic lesions in the second part of the duodenum.  It was recommended that he continue Protonix.  He is on nadolol.   He was told that the etiology of his cirrhosis was fatty liver.  He stopped his Protonix secondary to pain in his stomach. He stopped his pancreatic enzymes. He notes diarrhea for a long time (6 months to 2 years).  Diarrhea has improved on Enterogam (began 06/22/2015).  He has recurrent iron deficiency anemia.  He received 200 mg Venofer weekly 3 (03/26/2015 - 04/14/2015) and weekly x 3 (06/25/2015 - 07/09/2015).  Ferritin was 23 (low) on 06/18/2015 and 121 on 07/26/2015.   He stopped taking oral iron on 07/26/2015.  Symptomatically, he feels fine.  His hematocrit has decreased (33.7 to 30.3).  Ferritin is 17.  Plan: 1.  Labs today:  CBC with diff, CMP, ferritin, FLCA, SPEP, beta2-microglobulin. 2.  Bone survey next week 3.  Call patient with lab results (may need IV iron). 4.  RTC in 1 month for MD assessment, labs (CBC with diff, CMP, ferritin, retic, SPEP), and review of bone survey.  Addendum:  The patient was contacted after his appointment regarding his ferritin of 17.  He will be scheduled for Venofer x 3.   Lequita Asal, MD  09/13/2015, 10:10 AM

## 2015-09-13 NOTE — Telephone Encounter (Signed)
Called and spoke to pt and ferritin 17 and he will need 3 more doses of venofer.  Wanted to see if he would like to get started and he says they are getting ready to go on vacation and he will have to call me back. I will await his phone call to schedule venofer 200 mg IV weekly x 3.

## 2015-09-14 LAB — PROTEIN ELECTROPHORESIS, SERUM
A/G Ratio: 0.7 (ref 0.7–1.7)
Albumin ELP: 3.1 g/dL (ref 2.9–4.4)
Alpha-1-Globulin: 0.2 g/dL (ref 0.0–0.4)
Alpha-2-Globulin: 0.7 g/dL (ref 0.4–1.0)
Beta Globulin: 0.9 g/dL (ref 0.7–1.3)
Gamma Globulin: 2.5 g/dL — ABNORMAL HIGH (ref 0.4–1.8)
Globulin, Total: 4.4 g/dL — ABNORMAL HIGH (ref 2.2–3.9)
M-Spike, %: 2.3 g/dL — ABNORMAL HIGH
Total Protein ELP: 7.5 g/dL (ref 6.0–8.5)

## 2015-09-14 LAB — BETA 2 MICROGLOBULIN, SERUM: Beta-2 Microglobulin: 4.5 mg/L — ABNORMAL HIGH (ref 0.6–2.4)

## 2015-09-14 LAB — KAPPA/LAMBDA LIGHT CHAINS
Kappa free light chain: 23.8 mg/L — ABNORMAL HIGH (ref 3.3–19.4)
Kappa, lambda light chain ratio: 0.13 — ABNORMAL LOW (ref 0.26–1.65)
Lambda free light chains: 182.9 mg/L — ABNORMAL HIGH (ref 5.7–26.3)

## 2015-09-15 ENCOUNTER — Encounter: Payer: Self-pay | Admitting: Internal Medicine

## 2015-09-15 ENCOUNTER — Ambulatory Visit (INDEPENDENT_AMBULATORY_CARE_PROVIDER_SITE_OTHER): Payer: PPO | Admitting: Internal Medicine

## 2015-09-15 VITALS — BP 122/64 | HR 60 | Temp 98.2°F | Wt 181.0 lb

## 2015-09-15 DIAGNOSIS — D649 Anemia, unspecified: Secondary | ICD-10-CM | POA: Diagnosis not present

## 2015-09-15 DIAGNOSIS — R197 Diarrhea, unspecified: Secondary | ICD-10-CM

## 2015-09-15 DIAGNOSIS — C9000 Multiple myeloma not having achieved remission: Secondary | ICD-10-CM

## 2015-09-15 DIAGNOSIS — K766 Portal hypertension: Secondary | ICD-10-CM

## 2015-09-15 DIAGNOSIS — I8510 Secondary esophageal varices without bleeding: Secondary | ICD-10-CM

## 2015-09-15 DIAGNOSIS — I10 Essential (primary) hypertension: Secondary | ICD-10-CM

## 2015-09-15 DIAGNOSIS — D509 Iron deficiency anemia, unspecified: Secondary | ICD-10-CM

## 2015-09-15 DIAGNOSIS — D8989 Other specified disorders involving the immune mechanism, not elsewhere classified: Secondary | ICD-10-CM

## 2015-09-15 DIAGNOSIS — N138 Other obstructive and reflux uropathy: Secondary | ICD-10-CM

## 2015-09-15 DIAGNOSIS — I2510 Atherosclerotic heart disease of native coronary artery without angina pectoris: Secondary | ICD-10-CM | POA: Diagnosis not present

## 2015-09-15 DIAGNOSIS — E119 Type 2 diabetes mellitus without complications: Secondary | ICD-10-CM

## 2015-09-15 DIAGNOSIS — Z23 Encounter for immunization: Secondary | ICD-10-CM | POA: Diagnosis not present

## 2015-09-15 DIAGNOSIS — E7800 Pure hypercholesterolemia, unspecified: Secondary | ICD-10-CM

## 2015-09-15 DIAGNOSIS — N401 Enlarged prostate with lower urinary tract symptoms: Secondary | ICD-10-CM

## 2015-09-15 NOTE — Progress Notes (Signed)
Patient ID: Zachary Buck, male   DOB: 1941/02/08, 75 y.o.   MRN: [id number]   Subjective:    Patient ID: Zachary Buck, male    DOB: 1940/04/03, 75 y.o.   MRN: [id number]  HPI  Patient here for a scheduled follow up.  He just saw Dr Jacqlyn Larsen 09/01/15.  Had psa checked.  Due to have bone scan tomorrow.  Planning to return for venofer q week x 3.  They are following counts.  He is not checking his sugars.  Discussed with him today.  Is watching what he eats.  Discussed elevated triglycerides.  No chest pain.  Feels breathing is stable.  Abdominal discomfort stable.  Diarrhea - varies.  Flares intermittently.  Seeing GI.  No neck or arm pain.  Blood pressure doing well.    Past Medical History:  Diagnosis Date  . Anemia   . Atherosclerotic heart disease    s/p MI, s/p stent mid circumflex  . CAD (coronary artery disease)   . Cirrhosis ([place])   . Clavicle fracture   . Diabetes mellitus without complication ([place])   . Esophageal varices ([place])   . Hypercholesterolemia   . Hypertension   . Internal hemorrhoids   . Leg fracture   . Myocardial infarct ([place])   . Prostate enlargement   . Tubular adenoma of colon    Past Surgical History:  Procedure Laterality Date  . COLONOSCOPY WITH PROPOFOL N/A 03/04/2015   Procedure: COLONOSCOPY WITH PROPOFOL;  Surgeon: Jerene Bears, MD;  Location: [facility];  Service: Gastroenterology;  Laterality: N/A;  . CORONARY ANGIOPLASTY WITH STENT PLACEMENT    . ESOPHAGOGASTRODUODENOSCOPY (EGD) WITH PROPOFOL N/A 03/04/2015   Procedure: ESOPHAGOGASTRODUODENOSCOPY (EGD) WITH PROPOFOL;  Surgeon: Jerene Bears, MD;  Location: [facility];  Service: Gastroenterology;  Laterality: N/A;  . TONSILLECTOMY     Family History  Problem Relation Age of Onset  . Aneurysm Father 49    of the heart  . Heart attack Father   . Diabetes Mother    Social History   Social History  . Marital status: Married    Spouse name: N/A  . Number of children: 2  .  Years of education: N/A   Occupational History  . Estate manager/land agent   Social History Main Topics  . Smoking status: Never Smoker  . Smokeless tobacco: Never Used  . Alcohol use No  . Drug use: No  . Sexual activity: Not Asked   Other Topics Concern  . None   Social History Narrative  . None    Outpatient Encounter Prescriptions as of 09/15/2015  Medication Sig  . Alpha-Lipoic Acid (LIPOIC ACID PO) Take by mouth.  . Ascorbic Acid (VITAMIN C) 1000 MG tablet Take 1,000 mg by mouth daily.  . B Complex Vitamins (VITAMIN B COMPLEX) TABS Take by mouth daily.  . blood glucose meter kit and supplies KIT Dispense based on patient and insurance preference. Use up to four times daily as directed. (FOR ICD-9 250.00, 250.01).  . blood glucose meter kit and supplies KIT Dispense based on patient and insurance preference. Use up to four times daily as directed. E11.9 Please dispense Freestyle freedom meter and supplies.  . Cholecalciferol (VITAMIN D-3) 1000 UNITS CAPS Take by mouth.  . CHROMIUM PO Take by mouth.  . Cinnamon 500 MG capsule Take 500 mg by mouth.  . Coenzyme Q10 100 MG capsule Take by mouth.  Marland Kitchen glipiZIDE (GLUCOTROL) 5 MG tablet Use 5 mg in the morning and  10 mg in the evening.  Marland Kitchen glucose blood (BAYER CONTOUR NEXT TEST) test strip Contour Next EZ test strips: check blood sugars twice a day. Dx: 250.00  . glucose blood (ONE TOUCH ULTRA TEST) test strip Use as instructed  . Lancets (ONETOUCH ULTRASOFT) lancets Use as instructed  . lisinopril (PRINIVIL,ZESTRIL) 20 MG tablet TAKE ONE TABLET BY MOUTH EVERY DAY  . Magnesium 500 MG CAPS Take 500 mg by mouth daily.  . metFORMIN (GLUCOPHAGE) 1000 MG tablet TAKE 1 TABLET(1000 MG) BY MOUTH TWICE DAILY WITH A MEAL  . nadolol (CORGARD) 20 MG tablet Take 1 tablet (20 mg total) by mouth daily. (Patient taking differently: Take 10 mg by mouth daily. )  . Omega-3 Fatty Acids (FISH OIL) 1000 MG CAPS Take 4,000 mg by mouth daily.   . SBI/Protein  Isolate (ENTERAGAM) 5 g PACK Take 1 packet by mouth daily. (Patient taking differently: Take 1 packet by mouth as needed. )  . sildenafil (REVATIO) 20 MG tablet 3 to 5 tablets per day or as instructed  . sildenafil (VIAGRA) 25 MG tablet Take 25 mg by mouth as needed for erectile dysfunction.  . triamcinolone cream (KENALOG) 0.1 % Apply topically 2 (two) times daily. Please avoid face and genitalia area.  Do not use in the same spot for more than 7-10 days in a row.  . Turmeric POWD 4,000 mg by Does not apply route daily.  . vitamin B-12 (CYANOCOBALAMIN) 100 MCG tablet Take 200 mcg by mouth daily.  . [DISCONTINUED] FERROUS SULFATE PO Take 2 tablets by mouth daily.    Facility-Administered Encounter Medications as of 09/15/2015  Medication  . alteplase (CATHFLO ACTIVASE) injection 2 mg  . heparin lock flush 100 unit/mL  . heparin lock flush 100 unit/mL  . sodium chloride 0.9 % injection 10 mL  . sodium chloride 0.9 % injection 3 mL    Review of Systems  Constitutional: Negative for appetite change, chills and fever.  HENT: Negative for congestion and sinus pressure.   Respiratory: Negative for cough, chest tightness and shortness of breath.   Cardiovascular: Negative for chest pain, palpitations and leg swelling.  Gastrointestinal: Positive for abdominal pain and diarrhea. Negative for nausea and vomiting.  Genitourinary: Negative for dysuria.       Some nocturia.   Musculoskeletal: Negative for joint swelling and myalgias.  Neurological: Negative for dizziness, light-headedness and headaches.  Psychiatric/Behavioral: Negative for agitation and dysphoric mood.       Objective:    Physical Exam  Constitutional: He appears well-developed and well-nourished.  HENT:  Nose: Nose normal.  Mouth/Throat: Oropharynx is clear and moist.  Neck: Neck supple. No thyromegaly present.  Cardiovascular: Normal rate and regular rhythm.   Pulmonary/Chest: Effort normal and breath sounds normal. No  respiratory distress.  Abdominal: Soft. Bowel sounds are normal.  Minimal tenderness to palpation.   Musculoskeletal: He exhibits no edema or tenderness.  Lymphadenopathy:    He has no cervical adenopathy.  Skin: No rash noted. No erythema.  Psychiatric: He has a normal mood and affect. His behavior is normal.    BP 122/64   Pulse 60   Temp 98.2 F (36.8 C) (Oral)   Wt 181 lb (82.1 kg)   SpO2 96%   BMI 26.73 kg/m  Wt Readings from Last 3 Encounters:  09/15/15 181 lb (82.1 kg)  09/13/15 179 lb 12.6 oz (81.5 kg)  08/25/15 177 lb 9.6 oz (80.6 kg)     Lab Results  Component Value Date  WBC 5.5 09/13/2015   HGB 10.4 (L) 09/13/2015   HCT 30.3 (L) 09/13/2015   PLT 111 (L) 09/13/2015   GLUCOSE 257 (H) 09/13/2015   CHOL 197 08/02/2015   TRIG (H) 08/02/2015    1679.0 Triglyceride is over 400; calculations on Lipids are invalid.   HDL 16.70 (L) 08/02/2015   LDLDIRECT 44.0 08/02/2015   LDLCALC 51 05/14/2013   ALT 31 09/13/2015   AST 31 09/13/2015   NA 133 (L) 09/13/2015   K 4.5 09/13/2015   CL 107 09/13/2015   CREATININE 1.29 (H) 09/13/2015   BUN 29 (H) 09/13/2015   CO2 20 (L) 09/13/2015   TSH 2.73 04/29/2015   INR 1.1 (H) 08/25/2015   HGBA1C 7.5 (H) 08/02/2015   MICROALBUR 22.4 (H) 04/29/2015    Ct Chest Wo Contrast  Result Date: 06/21/2015 CLINICAL DATA:  Multiple myeloma. Hypermetabolism and opacity in the medial right lower lobe on the prior PET-CT, presenting for follow-up. EXAM: CT CHEST WITHOUT CONTRAST TECHNIQUE: Multidetector CT imaging of the chest was performed following the standard protocol without IV contrast. COMPARISON:  12/07/2014 PET-CT.  08/20/2013 chest CT. FINDINGS: Mediastinum/Nodes: Normal heart size. No pericardial fluid/thickening. Left anterior descending, left circumflex and right coronary atherosclerosis. Left circumflex coronary stent. Great vessels are normal in course and caliber. Normal visualized thyroid. Stable prominent gastroesophageal  varices extending throughout the lower third of the thoracic esophagus. No pathologically enlarged axillary, mediastinal or gross hilar lymph nodes, noting limited sensitivity for the detection of hilar adenopathy on this noncontrast study. Lungs/Pleura: No pneumothorax. No pleural effusion. Stable small calcified pleural plaques throughout the posterior and diaphragmatic pleural spaces bilaterally. There are subpleural lines and subpleural reticulation in a patchy distribution throughout both lungs, with associated mild traction bronchiectasis, most prominent in the posterior lower lobes, slightly increased since 08/20/2013 chest CT and more noticeably progressed since 05/01/2006 chest CT. No frank honeycombing. No acute consolidative airspace disease, significant pulmonary nodules or lung masses. Upper abdomen: Diffusely irregular liver surface, in keeping with cirrhosis. Partially visualized splenomegaly. Musculoskeletal: No aggressive appearing focal osseous lesions. Moderate degenerative changes in the thoracic spine. IMPRESSION: 1. 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 No pleural effusions. 2. 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 No frank honeycombing. This finding accounts for the medial right lower lobe hypermetabolism on the 12/07/2014 PET-CT. 3. Three-vessel coronary atherosclerosis. 4. Cirrhosis.  Splenomegaly.  Prominent gastroesophageal varices. Electronically Signed   By: Ilona Sorrel M.D.   On: 06/21/2015 17:16       Assessment & Plan:   Problem List Items Addressed This Visit    Anemia    Has had extensive GI w/up.  See notes.  Followed by oncology.  Planning for venofer q week x 3.        Benign prostatic hyperplasia with urinary obstruction    Followed by Dr Jacqlyn Larsen.        CAD (coronary artery disease)    Stable.  Continues f/u with cardiology.       Diabetes ([place])    Low carb diet and exercise.  Not checking his sugars regularly.  Discussed the need to check.  Last a1c 7.5.  Follow met b and a1c.       Relevant Orders   Hemoglobin A1c   Diarrhea    Continues with intermittent flares.  Seeing GI.  Essential hypertension, benign    Blood pressure under good control.  Continue same medication regimen.  Follow pressures.  Follow metabolic panel.        Relevant Orders   TSH   Hypercholesterolemia    Triglycerides significantly elevated recently.  Discussed low carb diet.  Will follow.        Relevant Orders   Lipid panel   Iron deficiency anemia    Followed by hematology.  See note.       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       Multiple myeloma ([place])    Followed by hematology.  See note.  Planning for f/u bone scan tomorrow.       Portal hypertension ([facility])    On nadolol.  Followed by GI.        Secondary esophageal varices without bleeding ([facility])    On nadolol.  Followed by GI.        Other Visit Diagnoses    Need for 23-polyvalent pneumococcal polysaccharide vaccine    -  Primary   Relevant Orders   Pneumococcal polysaccharide vaccine 23-valent greater than or equal to 2yo subcutaneous/IM (Completed)       Einar Pheasant, MD

## 2015-09-15 NOTE — Progress Notes (Signed)
Pre visit review using our clinic review tool, if applicable. No additional management support is needed unless otherwise documented below in the visit note. 

## 2015-09-16 ENCOUNTER — Ambulatory Visit
Admission: RE | Admit: 2015-09-16 | Discharge: 2015-09-16 | Disposition: A | Discharge status: Home / Self Care | Payer: PPO | Source: Ambulatory Visit | Attending: Hematology and Oncology | Admitting: Hematology and Oncology

## 2015-09-16 DIAGNOSIS — C9000 Multiple myeloma not having achieved remission: Secondary | ICD-10-CM | POA: Insufficient documentation

## 2015-09-17 ENCOUNTER — Encounter: Payer: Self-pay | Admitting: Internal Medicine

## 2015-09-17 ENCOUNTER — Other Ambulatory Visit: Payer: Self-pay | Admitting: Hematology and Oncology

## 2015-09-17 ENCOUNTER — Inpatient Hospital Stay: Payer: PPO

## 2015-09-17 VITALS — BP 137/74 | HR 58 | Temp 97.6°F

## 2015-09-17 DIAGNOSIS — D509 Iron deficiency anemia, unspecified: Secondary | ICD-10-CM

## 2015-09-17 DIAGNOSIS — C9000 Multiple myeloma not having achieved remission: Secondary | ICD-10-CM | POA: Diagnosis not present

## 2015-09-17 MED ORDER — SODIUM CHLORIDE 0.9 % IV SOLN
INTRAVENOUS | Status: DC
  Administered 2015-09-17: 09:00:00 via INTRAVENOUS
  Filled 2015-09-17: qty 1000

## 2015-09-17 MED ORDER — SODIUM CHLORIDE 0.9 % IV SOLN
200.0000 mg | Freq: Once | INTRAVENOUS | Status: AC
  Administered 2015-09-17: 200 mg via INTRAVENOUS
  Filled 2015-09-17: qty 10

## 2015-09-17 NOTE — Assessment & Plan Note (Signed)
Blood pressure under good control.  Continue same medication regimen.  Follow pressures.  Follow metabolic panel.   

## 2015-09-17 NOTE — Assessment & Plan Note (Signed)
Followed by Dr Cope.  

## 2015-09-17 NOTE — Assessment & Plan Note (Signed)
Low carb diet and exercise.  Not checking his sugars regularly.  Discussed the need to check.  Last a1c 7.5.  Follow met b and a1c.

## 2015-09-17 NOTE — Assessment & Plan Note (Signed)
On nadolol.  Followed by GI.

## 2015-09-17 NOTE — Assessment & Plan Note (Signed)
Continues with intermittent flares.  Seeing GI.

## 2015-09-17 NOTE — Assessment & Plan Note (Signed)
-

## 2015-09-17 NOTE — Assessment & Plan Note (Signed)
Followed by hematology.  See note.  Planning for f/u bone scan tomorrow.

## 2015-09-17 NOTE — Assessment & Plan Note (Signed)
Stable.  Continues f/u with cardiology.

## 2015-09-17 NOTE — Assessment & Plan Note (Signed)
Triglycerides significantly elevated recently.  Discussed low carb diet.  Will follow.

## 2015-09-17 NOTE — Assessment & Plan Note (Signed)
Followed by hematology.  See note.

## 2015-09-17 NOTE — Assessment & Plan Note (Signed)
Has had extensive GI w/up.  See notes.  Followed by oncology.  Planning for venofer q week x 3.

## 2015-09-21 ENCOUNTER — Other Ambulatory Visit: Payer: Self-pay | Admitting: Hematology and Oncology

## 2015-09-24 ENCOUNTER — Inpatient Hospital Stay: Payer: PPO | Attending: Hematology and Oncology

## 2015-09-24 ENCOUNTER — Telehealth: Payer: Self-pay | Admitting: Internal Medicine

## 2015-09-24 VITALS — BP 133/67 | HR 71 | Temp 96.6°F | Resp 18

## 2015-09-24 DIAGNOSIS — K760 Fatty (change of) liver, not elsewhere classified: Secondary | ICD-10-CM | POA: Insufficient documentation

## 2015-09-24 DIAGNOSIS — D696 Thrombocytopenia, unspecified: Secondary | ICD-10-CM | POA: Diagnosis not present

## 2015-09-24 DIAGNOSIS — I864 Gastric varices: Secondary | ICD-10-CM | POA: Insufficient documentation

## 2015-09-24 DIAGNOSIS — K7469 Other cirrhosis of liver: Secondary | ICD-10-CM | POA: Insufficient documentation

## 2015-09-24 DIAGNOSIS — I252 Old myocardial infarction: Secondary | ICD-10-CM | POA: Insufficient documentation

## 2015-09-24 DIAGNOSIS — K766 Portal hypertension: Secondary | ICD-10-CM | POA: Diagnosis not present

## 2015-09-24 DIAGNOSIS — K317 Polyp of stomach and duodenum: Secondary | ICD-10-CM | POA: Diagnosis not present

## 2015-09-24 DIAGNOSIS — Z77090 Contact with and (suspected) exposure to asbestos: Secondary | ICD-10-CM | POA: Insufficient documentation

## 2015-09-24 DIAGNOSIS — E119 Type 2 diabetes mellitus without complications: Secondary | ICD-10-CM | POA: Diagnosis not present

## 2015-09-24 DIAGNOSIS — Z79899 Other long term (current) drug therapy: Secondary | ICD-10-CM | POA: Diagnosis not present

## 2015-09-24 DIAGNOSIS — J849 Interstitial pulmonary disease, unspecified: Secondary | ICD-10-CM | POA: Diagnosis not present

## 2015-09-24 DIAGNOSIS — D509 Iron deficiency anemia, unspecified: Secondary | ICD-10-CM | POA: Insufficient documentation

## 2015-09-24 DIAGNOSIS — I10 Essential (primary) hypertension: Secondary | ICD-10-CM | POA: Insufficient documentation

## 2015-09-24 DIAGNOSIS — I2510 Atherosclerotic heart disease of native coronary artery without angina pectoris: Secondary | ICD-10-CM | POA: Insufficient documentation

## 2015-09-24 DIAGNOSIS — C9000 Multiple myeloma not having achieved remission: Secondary | ICD-10-CM | POA: Diagnosis not present

## 2015-09-24 DIAGNOSIS — N400 Enlarged prostate without lower urinary tract symptoms: Secondary | ICD-10-CM | POA: Diagnosis not present

## 2015-09-24 MED ORDER — SODIUM CHLORIDE 0.9 % IV SOLN
200.0000 mg | Freq: Once | INTRAVENOUS | Status: AC
  Administered 2015-09-24: 200 mg via INTRAVENOUS
  Filled 2015-09-24: qty 10

## 2015-09-24 MED ORDER — SODIUM CHLORIDE 0.9 % IV SOLN
Freq: Once | INTRAVENOUS | Status: AC
  Administered 2015-09-24: 09:00:00 via INTRAVENOUS
  Filled 2015-09-24: qty 1000

## 2015-09-24 NOTE — Telephone Encounter (Signed)
Pt states her husband has been having more issues with short term memory loss and slurring his "s" words. States she knows it is not dementia. Feels all his issues are related to the nadolol and states the oncologist told them his ammonia level was elevated and this could be causing some symptoms. Discussed with wife that perhaps they might want to see a neurologist regarding his memory loss but she is not receptive to that idea. Pt is scheduled for OV with Dr. Hilarie Fredrickson but she is concerned that it is so far away. Wonders if Dr. Hilarie Fredrickson may want him to have labs done to check ammonia level or if he has any other recommendations. Please advise.

## 2015-09-26 NOTE — Telephone Encounter (Signed)
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  He has known varices and beta blocker can be life saving by preventing variceal hemorrhage.  I feel strongly about beta blocker  If no change with adequate (1 month) trial of rifaximin then I agree with neurology referral for eval.

## 2015-09-27 ENCOUNTER — Encounter: Payer: Self-pay | Admitting: Internal Medicine

## 2015-09-27 DIAGNOSIS — K7469 Other cirrhosis of liver: Secondary | ICD-10-CM | POA: Insufficient documentation

## 2015-09-27 MED ORDER — RIFAXIMIN 550 MG PO TABS: 550.0000 mg | ORAL_TABLET | Freq: Two times a day (BID) | ORAL | 0 refills | Status: DC

## 2015-09-27 NOTE — Telephone Encounter (Signed)
Left message for pt to call back  °

## 2015-09-27 NOTE — Telephone Encounter (Signed)
Spoke with pts wife and she is aware. Script for [facility]an sent to [facility].

## 2015-09-28 ENCOUNTER — Telehealth: Payer: Self-pay | Admitting: Internal Medicine

## 2015-09-28 ENCOUNTER — Encounter: Payer: Self-pay | Admitting: *Deleted

## 2015-09-28 MED ORDER — TRIAMCINOLONE ACETONIDE 0.1 % EX CREA: TOPICAL_CREAM | Freq: Two times a day (BID) | CUTANEOUS | 0 refills | Status: DC

## 2015-09-28 NOTE — Telephone Encounter (Signed)
rx sent in for triamcinolone cream.   

## 2015-09-28 NOTE — Telephone Encounter (Signed)
I have advised Neda that I have already gotten approval through [employer] for patient's Xifaxan from 09/28/15-03/26/16. She verbalizes understanding. She states that rx has already been filled at a local pharmacy for patient.

## 2015-09-29 ENCOUNTER — Encounter: Payer: Self-pay | Admitting: Internal Medicine

## 2015-10-01 ENCOUNTER — Other Ambulatory Visit: Payer: Self-pay | Admitting: Hematology and Oncology

## 2015-10-01 ENCOUNTER — Inpatient Hospital Stay: Payer: PPO

## 2015-10-01 VITALS — BP 156/70 | HR 58 | Temp 97.4°F | Resp 18

## 2015-10-01 DIAGNOSIS — D509 Iron deficiency anemia, unspecified: Secondary | ICD-10-CM

## 2015-10-01 DIAGNOSIS — D539 Nutritional anemia, unspecified: Secondary | ICD-10-CM

## 2015-10-01 DIAGNOSIS — C9000 Multiple myeloma not having achieved remission: Secondary | ICD-10-CM | POA: Diagnosis not present

## 2015-10-01 MED ORDER — SODIUM CHLORIDE 0.9 % IV SOLN
Freq: Once | INTRAVENOUS | Status: AC
  Administered 2015-10-01: 09:00:00 via INTRAVENOUS
  Filled 2015-10-01: qty 1000

## 2015-10-01 MED ORDER — SODIUM CHLORIDE 0.9 % IV SOLN
200.0000 mg | Freq: Once | INTRAVENOUS | Status: AC
  Administered 2015-10-01: 200 mg via INTRAVENOUS
  Filled 2015-10-01: qty 10

## 2015-10-04 NOTE — Telephone Encounter (Signed)
Rifaximin 550 mg twice a day recently recommended for hepatic encephalopathy in patient with known cirrhosis Medication is cost prohibitive Please see if help can be provided to the patient for drug assistance through rifaximin manufacturer or other ways to improve the expense of this medication for him Chronic diarrhea precludes lactulose

## 2015-10-06 ENCOUNTER — Encounter: Payer: Self-pay | Admitting: Internal Medicine

## 2015-10-18 ENCOUNTER — Inpatient Hospital Stay (HOSPITAL_BASED_OUTPATIENT_CLINIC_OR_DEPARTMENT_OTHER): Payer: PPO | Admitting: Hematology and Oncology

## 2015-10-18 ENCOUNTER — Telehealth: Payer: Self-pay | Admitting: *Deleted

## 2015-10-18 ENCOUNTER — Inpatient Hospital Stay: Payer: PPO

## 2015-10-18 ENCOUNTER — Other Ambulatory Visit: Payer: Self-pay | Admitting: *Deleted

## 2015-10-18 ENCOUNTER — Other Ambulatory Visit: Payer: Self-pay | Admitting: Internal Medicine

## 2015-10-18 VITALS — BP 161/75 | HR 60 | Temp 97.8°F | Resp 18 | Wt 182.0 lb

## 2015-10-18 DIAGNOSIS — D696 Thrombocytopenia, unspecified: Secondary | ICD-10-CM | POA: Diagnosis not present

## 2015-10-18 DIAGNOSIS — D509 Iron deficiency anemia, unspecified: Secondary | ICD-10-CM | POA: Diagnosis not present

## 2015-10-18 DIAGNOSIS — C9000 Multiple myeloma not having achieved remission: Secondary | ICD-10-CM | POA: Diagnosis not present

## 2015-10-18 DIAGNOSIS — Z77090 Contact with and (suspected) exposure to asbestos: Secondary | ICD-10-CM

## 2015-10-18 DIAGNOSIS — K7469 Other cirrhosis of liver: Secondary | ICD-10-CM

## 2015-10-18 DIAGNOSIS — Z79899 Other long term (current) drug therapy: Secondary | ICD-10-CM

## 2015-10-18 DIAGNOSIS — J849 Interstitial pulmonary disease, unspecified: Secondary | ICD-10-CM

## 2015-10-18 DIAGNOSIS — K760 Fatty (change of) liver, not elsewhere classified: Secondary | ICD-10-CM

## 2015-10-18 DIAGNOSIS — D649 Anemia, unspecified: Secondary | ICD-10-CM

## 2015-10-18 LAB — COMPREHENSIVE METABOLIC PANEL
ALT: 25 U/L (ref 17–63)
AST: 28 U/L (ref 15–41)
Albumin: 3.1 g/dL — ABNORMAL LOW (ref 3.5–5.0)
Alkaline Phosphatase: 68 U/L (ref 38–126)
Anion gap: 6 (ref 5–15)
BUN: 28 mg/dL — ABNORMAL HIGH (ref 6–20)
CO2: 22 mmol/L (ref 22–32)
Calcium: 9.6 mg/dL (ref 8.9–10.3)
Chloride: 108 mmol/L (ref 101–111)
Creatinine, Ser: 1.2 mg/dL (ref 0.61–1.24)
GFR calc Af Amer: >60 mL/min (ref >60)
GFR calc non Af Amer: 57 mL/min — ABNORMAL LOW (ref >60)
Glucose, Bld: 252 mg/dL — ABNORMAL HIGH (ref 65–99)
Potassium: 4.5 mmol/L (ref 3.5–5.1)
Sodium: 136 mmol/L (ref 135–145)
Total Bilirubin: 0.8 mg/dL (ref 0.3–1.2)
Total Protein: 7.5 g/dL (ref 6.5–8.1)

## 2015-10-18 LAB — CBC WITH DIFFERENTIAL/PLATELET
Basophils Absolute: 0.1 10*3/uL (ref 0–0.1)
Basophils Relative: 1 %
Eosinophils Absolute: 0.4 10*3/uL (ref 0–0.7)
Eosinophils Relative: 8 %
HCT: 27.6 % — ABNORMAL LOW (ref 40.0–52.0)
Hemoglobin: 9.7 g/dL — ABNORMAL LOW (ref 13.0–18.0)
Lymphocytes Relative: 21 %
Lymphs Abs: 1 10*3/uL (ref 1.0–3.6)
MCH: 31.1 pg (ref 26.0–34.0)
MCHC: 35.1 g/dL (ref 32.0–36.0)
MCV: 88.6 fL (ref 80.0–100.0)
Monocytes Absolute: 0.6 10*3/uL (ref 0.2–1.0)
Monocytes Relative: 12 %
Neutro Abs: 2.8 10*3/uL (ref 1.4–6.5)
Neutrophils Relative %: 58 %
Platelets: 97 10*3/uL — ABNORMAL LOW (ref 150–440)
RBC: 3.11 MIL/uL — ABNORMAL LOW (ref 4.40–5.90)
RDW: 16.9 % — ABNORMAL HIGH (ref 11.5–14.5)
WBC: 4.9 10*3/uL (ref 3.8–10.6)

## 2015-10-18 LAB — LACTATE DEHYDROGENASE: LDH: 162 U/L (ref 98–192)

## 2015-10-18 LAB — RETICULOCYTES
RBC.: 3.11 MIL/uL — ABNORMAL LOW (ref 4.40–5.90)
Retic Count, Absolute: 146.2 10*3/uL (ref 19.0–183.0)
Retic Ct Pct: 4.7 % — ABNORMAL HIGH (ref 0.4–3.1)

## 2015-10-18 LAB — FERRITIN: Ferritin: 39 ng/mL (ref 24–336)

## 2015-10-18 LAB — DAT, POLYSPECIFIC AHG (ARMC ONLY): Polyspecific AHG test: NEGATIVE

## 2015-10-18 NOTE — Telephone Encounter (Signed)
Wife called to state that husband got home and had to have a BM and did not read instructions for occult stool cards and used card # 3 and then read also that he should not eat red meat and he ate MR steak last night.  Advised that he would need to throw that stool card away and come get another one and pt wanted 3 new cards and he will start again.  3 cards made for him and he is on his way to pick up cards to start again

## 2015-10-18 NOTE — Progress Notes (Signed)
[facility] day:  10/18/15  Chief Complaint: Zachary Buck is a 75 y.o. male  with stage II multiple myeloma and iron deficiency anemia who is seen for 1 month assessment.  HPI: The patient was last seen in the medical oncology clinic on 09/13/2015.  At that time, his hematocrit had dropped from 33.7 to 30.3.  Ferritin was 17.  He was scheduled for Venofer x 3 (07/28, 08/04, and 10/01/2015).  He tolerated his infusions well.  Bone survey on 09/16/2015 revealed no focal lytic lesion or acute bony abnormality.  Symptomatically, he states "I'm good".  He continues to have diarrhea, although his wife notes that it has improved tremendously.  He does not fel that Enterogam is helpful, although his wife states that he hasn't taken it enough to know.  He denies any melena or hematochezia.  He denies any infections.   Past Medical History:  Diagnosis Date  . Anemia   . Atherosclerotic heart disease    s/p MI, s/p stent mid circumflex  . CAD (coronary artery disease)   . Cirrhosis ([place])   . Clavicle fracture   . Diabetes mellitus without complication ([place])   . Esophageal varices ([place])   . Hepatic encephalopathy ([place])   . Hypercholesterolemia   . Hypertension   . Internal hemorrhoids   . Leg fracture   . Myocardial infarct ([place])   . Prostate enlargement   . Tubular adenoma of colon     Past Surgical History:  Procedure Laterality Date  . COLONOSCOPY WITH PROPOFOL N/A 03/04/2015   Procedure: COLONOSCOPY WITH PROPOFOL;  Surgeon: Jerene Bears, MD;  Location: [facility];  Service: Gastroenterology;  Laterality: N/A;  . CORONARY ANGIOPLASTY WITH STENT PLACEMENT    . ESOPHAGOGASTRODUODENOSCOPY (EGD) WITH PROPOFOL N/A 03/04/2015   Procedure: ESOPHAGOGASTRODUODENOSCOPY (EGD) WITH PROPOFOL;  Surgeon: Jerene Bears, MD;  Location: [facility];  Service: Gastroenterology;  Laterality: N/A;  . TONSILLECTOMY      Family History  Problem  Relation Age of Onset  . Aneurysm Father 92    of the heart  . Heart attack Father   . Diabetes Mother     Social History:  reports that he has never smoked. He has never used smokeless tobacco. He reports that he does not drink alcohol or use drugs.  Patient denies any exposure to radiation or toxins. The patient is accompanied by his wife, Zachary Buck, today.  Allergies:  Allergies  Allergen Reactions  . Niaspan [Niacin Er] Other (See Comments)    Just does not want to take     Current Medications: Current Outpatient Prescriptions  Medication Sig Dispense Refill  . Alpha-Lipoic Acid (LIPOIC ACID PO) Take by mouth.    . Ascorbic Acid (VITAMIN C) 1000 MG tablet Take 1,000 mg by mouth daily.    . B Complex Vitamins (VITAMIN B COMPLEX) TABS Take by mouth daily.    . blood glucose meter kit and supplies KIT Dispense based on patient and insurance preference. Use up to four times daily as directed. (FOR ICD-9 250.00, 250.01). 1 each 0  . blood glucose meter kit and supplies KIT Dispense based on patient and insurance preference. Use up to four times daily as directed. E11.9 Please dispense Freestyle freedom meter and supplies. 1 each 0  . Cholecalciferol (VITAMIN D-3) 1000 UNITS CAPS Take by mouth.    . CHROMIUM PO Take by mouth.    . Cinnamon 500 MG capsule Take 500 mg by  mouth.    . Coenzyme Q10 100 MG capsule Take by mouth.    Marland Kitchen glipiZIDE (GLUCOTROL) 5 MG tablet Use 5 mg in the morning and 10 mg in the evening. 270 tablet 3  . glucose blood (BAYER CONTOUR NEXT TEST) test strip Contour Next EZ test strips: check blood sugars twice a day. Dx: 250.00    . glucose blood (ONE TOUCH ULTRA TEST) test strip Use as instructed 100 each 12  . Lancets (ONETOUCH ULTRASOFT) lancets Use as instructed 100 each 12  . lisinopril (PRINIVIL,ZESTRIL) 20 MG tablet TAKE ONE TABLET BY MOUTH EVERY DAY    . Magnesium 500 MG CAPS Take 500 mg by mouth daily.    . metFORMIN (GLUCOPHAGE) 1000 MG tablet TAKE 1  TABLET(1000 MG) BY MOUTH TWICE DAILY WITH A MEAL 60 tablet 5  . nadolol (CORGARD) 20 MG tablet Take 1 tablet (20 mg total) by mouth daily. (Patient taking differently: Take 10 mg by mouth daily. ) 30 tablet 11  . Omega-3 Fatty Acids (FISH OIL) 1000 MG CAPS Take 4,000 mg by mouth daily.     . rifaximin (XIFAXAN) 550 MG TABS tablet Take 1 tablet (550 mg total) by mouth 2 (two) times daily. 30 tablet 0  . SBI/Protein Isolate (ENTERAGAM) 5 g PACK Take 1 packet by mouth daily. (Patient taking differently: Take 1 packet by mouth as needed. ) 30 each 3  . sildenafil (REVATIO) 20 MG tablet 3 to 5 tablets per day or as instructed    . sildenafil (VIAGRA) 25 MG tablet Take 25 mg by mouth as needed for erectile dysfunction.    . triamcinolone cream (KENALOG) 0.1 % Apply topically 2 (two) times daily. Please avoid face and genitalia area.  Do not use in the same spot for more than 7-10 days in a row. 80 g 0  . Turmeric POWD 4,000 mg by Does not apply route daily.    . vitamin B-12 (CYANOCOBALAMIN) 100 MCG tablet Take 200 mcg by mouth daily.     No current facility-administered medications for this visit.    Facility-Administered Medications Ordered in Other Visits  Medication Dose Route Frequency Provider Last Rate Last Dose  . alteplase (CATHFLO ACTIVASE) injection 2 mg  2 mg Intracatheter Once PRN Zachary Asal, MD      . heparin lock flush 100 unit/mL  500 Units Intracatheter Once PRN Zachary Asal, MD      . heparin lock flush 100 unit/mL  250 Units Intracatheter Once PRN Zachary Asal, MD      . sodium chloride 0.9 % injection 10 mL  10 mL Intracatheter PRN Zachary Asal, MD      . sodium chloride 0.9 % injection 3 mL  3 mL Intravenous Once PRN Zachary Asal, MD        Review of Systems:  GENERAL:  Feels "slow".  No fevers or sweats.  Weight down 3 pounds secondary to change in diet. PERFORMANCE STATUS (ECOG):  1 HEENT:  Voice "not sa strong".  No visual changes, runny  nose, sore throat, mouth sores or tenderness. Lungs: No shortness of breath or cough.  No hemoptysis. Cardiac:  No chest pain, palpitations, orthopnea, or PND. GI:  No diarrhea or abdominal pain in 1 week.  Black stool x 1.  No vomiting or constipation. GU:  No urgency, frequency, dysuria, or hematuria. Musculoskeletal:  No back pain.  No joint pain.  No muscle tenderness. Extremities:  No pain or swelling. Skin:  No rashes or skin changes. Neuro:  No headache, numbness or weakness, balance or coordination issues. Endocrine:  Diabetes.  No thyroid issues, hot flashes or night sweats. Psych:  No mood changes, depression or anxiety. Pain:  No focal pain. Review of systems:  All other systems reviewed and found to be negative.  Physical Exam: BP (!) 161/75 (BP Location: Left Arm, Patient Position: Sitting)   Pulse 60   Temp 97.8 F (36.6 C) (Tympanic)   Resp 18   Wt 181 lb 15.8 oz (82.6 kg)   BMI 26.88 kg/m   GENERAL:  Well developed, well nourished, sitting comfortably in the exam room in no acute distress. MENTAL STATUS:  Alert and oriented to person, place and time. HEAD:  Pearline Cables hair.  Normocephalic, atraumatic, face symmetric, no Cushingoid features. EYES:  Blue eyes.  Pupils equal round and reactive to light and accomodation.  No conjunctivitis or scleral icterus. ENT:  Oropharynx clear without lesion.  Tongue normal. Mucous membranes moist.  RESPIRATORY:  Clear to auscultation without rales, wheezes or rhonchi. CARDIOVASCULAR:  Regular rate and rhythm without murmur, rub or gallop. ABDOMEN:  Soft, non-tender, with active bowel sounds, and no appreciable hepatosplenomegaly.  No masses. SKIN:  No rashes, ulcers or lesions. EXTREMITIES: No edema, no skin discoloration or tenderness.  No palpable cords. LYMPH NODES: No palpable cervical, supraclavicular, axillary or inguinal adenopathy  NEUROLOGICAL: Unremarkable. PSYCH:  Appropriate.    Appointment on 10/18/2015  Component Date  Value Ref Range Status  . WBC 10/18/2015 4.9  3.8 - 10.6 K/uL Final  . RBC 10/18/2015 3.11* 4.40 - 5.90 MIL/uL Final  . Hemoglobin 10/18/2015 9.7* 13.0 - 18.0 g/dL Final  . HCT 10/18/2015 27.6* 40.0 - 52.0 % Final  . MCV 10/18/2015 88.6  80.0 - 100.0 fL Final  . MCH 10/18/2015 31.1  26.0 - 34.0 pg Final  . MCHC 10/18/2015 35.1  32.0 - 36.0 g/dL Final  . RDW 10/18/2015 16.9* 11.5 - 14.5 % Final  . Platelets 10/18/2015 97* 150 - 440 K/uL Final  . Neutrophils Relative % 10/18/2015 58%  % Final  . Neutro Abs 10/18/2015 2.8  1.4 - 6.5 K/uL Final  . Lymphocytes Relative 10/18/2015 21%  % Final  . Lymphs Abs 10/18/2015 1.0  1.0 - 3.6 K/uL Final  . Monocytes Relative 10/18/2015 12%  % Final  . Monocytes Absolute 10/18/2015 0.6  0.2 - 1.0 K/uL Final  . Eosinophils Relative 10/18/2015 8%  % Final  . Eosinophils Absolute 10/18/2015 0.4  0 - 0.7 K/uL Final  . Basophils Relative 10/18/2015 1%  % Final  . Basophils Absolute 10/18/2015 0.1  0 - 0.1 K/uL Final  . Sodium 10/18/2015 136  135 - 145 mmol/L Final  . Potassium 10/18/2015 4.5  3.5 - 5.1 mmol/L Final  . Chloride 10/18/2015 108  101 - 111 mmol/L Final  . CO2 10/18/2015 22  22 - 32 mmol/L Final  . Glucose, Bld 10/18/2015 252* 65 - 99 mg/dL Final  . BUN 10/18/2015 28* 6 - 20 mg/dL Final  . Creatinine, Ser 10/18/2015 1.20  0.61 - 1.24 mg/dL Final  . Calcium 10/18/2015 9.6  8.9 - 10.3 mg/dL Final  . Total Protein 10/18/2015 7.5  6.5 - 8.1 g/dL Final  . Albumin 10/18/2015 3.1* 3.5 - 5.0 g/dL Final  . AST 10/18/2015 28  15 - 41 U/L Final  . ALT 10/18/2015 25  17 - 63 U/L Final  . Alkaline Phosphatase 10/18/2015 68  38 - 126 U/L Final  .  Total Bilirubin 10/18/2015 0.8  0.3 - 1.2 mg/dL Final  . GFR calc non Af Amer 10/18/2015 57* >60 mL/min Final  . GFR calc Af Amer 10/18/2015 >60  >60 mL/min Final   Comment: (NOTE) The eGFR has been calculated using the CKD EPI equation. This calculation has not been validated in all clinical  situations. eGFR's persistently <60 mL/min signify possible Chronic Kidney Disease.   . Anion gap 10/18/2015 6  5 - 15 Final  . Retic Ct Pct 10/18/2015 4.7* 0.4 - 3.1 % Final  . RBC. 10/18/2015 3.11* 4.40 - 5.90 MIL/uL Final  . Retic Count, Manual 10/18/2015 146.2  19.0 - 183.0 K/uL Final    Assessment:  TOSHIO SLUSHER is a 75 y.o. male with iron deficiency anemia and stage II multiple myeloma. 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 Serum immunoglobulins noted an IgG of 2930 (high). 24-hour urine revealed a 8.8% monoclonal protein (10.6 mg/24 hours).  Albumen was 3.3 on 08/26/2014. He had pneumonia in 05/2014.   Bone survey on 10/20/2014 revealed no lytic lesions. Bone survey on 09/16/2015 revealed no focal lytic lesion or acute bony abnormality.   SPEP was 2.3 g/dL on 10/09/2014, 01/18/2015, 03/22/2015, 06/18/2015, and 09/13/2015.  Lambda free light chains were 155.3 (ratio 0.15) on 01/18/2015, 174.59 (ratio of 0.16) on 06/18/2015, and 182.9 (ratio of 0.13) on 09/13/2015.  Bone marrow biopsy on 11/10/2014 revealed a 10% monoclonal plasma cell infiltrate. Marrow was hypercellular for age (40-50%) with mixed maturing hematopoiesis, relative erythroid hyperplasia and mild nonspecific dyserythropoiesis. There was patchy mild focally moderate increase in reticulin. There were no significant iron stores. Myeloma FISH panel revealed translocation (11;14) which results in fusion of CCND1 (BCL1) at 11q13 with the immunoglobulin heavy chain gene (IgH) at 14q32 (a favorable prognostic feature).   Beta2 microglobulin was 4.2 on 11/24/2014, 3.8 on 06/18/2015, and 4.9 on 10/18/2015.  PET scan on 12/07/2014 revealed no hypermetabolic foci within the marrow space or nodal stations to suggest active multiple myeloma. There was hypermetabolism within  the right lower lobe, corresponding to a similar dependent density. Morphology favored scarring.There was cirrhosis and portal hypertension. There was gastric body hypermetabolism favoring physiologic. Spleen was 15.3 cm (volume 1065 cc).  There was pulmonary artery enlargement suggesting pulmonary artery hypertension. 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  He has a history of asbestos exposure in the [employer].  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  Abdominal CT scan on 03/16/2015 revealed splenomegaly (14.5 cm), cirrhosis, and stigmata of portal hypertension with esophageal and gastric varices.  There were no liver lesions.  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  There were no pleural effusions.  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  There was no frank honeycombing. This finding accounted for the medial right lower lobe hypermetabolism on the 12/07/2014 PET-CT.  There was cirrhosis, splenomegaly, and prominent gastroesophageal varices.  He has had mild anemia since 10/2013 and mild thrombocytopenia since 05/2014. Colonoscopy in 10/2013 revealed polyps. EGD revealed gastritis. Capsule enteroscopy on 11/27/2014 revealed 1-2 gastric polyps and one colon polyp.  There were no findings to explain iron deficiency anemia.  His diet was good. Labs confirmed iron deficiency (ferritin 11.8 on 05/2014 and  13 on 10/09/2014). B12 was low normal (MMA normal) thus ruling out B12 deficiency.  Colonoscopy on 03/04/2015 revealed a sessile polyp was seen in the transverse colon (tubular adenoma). EGD on 03/04/2015 revealed medium-sized esophageal varices in the mid and distal esophagus without bleeding. There was a sessile  polyp in the gastric antrum (hyperplastic polyp).  There were 3 small angiodysplastic lesions in the second part of the duodenum.  It was recommended that he continue Protonix.  He is on nadolol.  He was told that the etiology of his cirrhosis was fatty liver.  He has had a capsule study.  He stopped his Protonix secondary to pain in his stomach. He stopped his pancreatic enzymes. He notes diarrhea for a long time (6 months to 2 years).  Diarrhea has improved on Enterogam (began 06/22/2015).  He has recurrent iron deficiency anemia.  He received 200 mg Venofer weekly 3 (03/26/2015 - 04/14/2015), weekly x 3 (06/25/2015 - 07/09/2015), and weekly x 3 (09/17/2015 - 10/01/2015).  Ferritin was 23 (low) on 06/18/2015 and 121 on 07/26/2015.  Ferritin dropped to 17 on 09/13/2015.  Symptomatically, he feels good  His hematocrit has dropped from 33.7 (07/26/2015) to 27.6 (10/18/2015).  Platelet count has dropped to 97,000.  Ferritin is 39 despite recent IV iron.  He denies any melena or hematochezia.  Plan: 1.  Labs today:  CBC with diff, CMP, ferritin, retic, SPEP, beta2 microglobulin, LDH, DAT. 2.  Guaiac cards x 3. 3.  Discuss declining hematocrit.  Etiology secondary to GI oozing (blood loss) and persistent iron deficiency anemia or progressive myeloma.  Await anemia work-up.  Discuss possible additional iron.  Discuss possible repeat bone marrow aspirate and biopsy. 4.  MD to call patient with labs and planned intervention. 5.  RTC in 1 month for MD assessment and labs (CBC, SPEP, CMP).  Zachary Asal, MD  10/18/2015, 9:18 AM

## 2015-10-18 NOTE — Progress Notes (Signed)
Patient is here for follow up, he is doing well mild discomfort in his abdomen but this is due to nausea.

## 2015-10-19 LAB — BETA 2 MICROGLOBULIN, SERUM: Beta-2 Microglobulin: 4.5 mg/L — ABNORMAL HIGH (ref 0.6–2.4)

## 2015-10-20 DIAGNOSIS — H43393 Other vitreous opacities, bilateral: Secondary | ICD-10-CM | POA: Diagnosis not present

## 2015-10-20 DIAGNOSIS — H5213 Myopia, bilateral: Secondary | ICD-10-CM | POA: Diagnosis not present

## 2015-10-20 DIAGNOSIS — H5203 Hypermetropia, bilateral: Secondary | ICD-10-CM | POA: Diagnosis not present

## 2015-10-20 DIAGNOSIS — H2513 Age-related nuclear cataract, bilateral: Secondary | ICD-10-CM | POA: Diagnosis not present

## 2015-10-24 ENCOUNTER — Encounter: Payer: Self-pay | Admitting: Hematology and Oncology

## 2015-10-26 ENCOUNTER — Telehealth: Payer: Self-pay | Admitting: *Deleted

## 2015-10-26 NOTE — Telephone Encounter (Signed)
Called pt and left message about needing to come in and get labs done.  md ordered additional labs on Friday but p tgot stool ards but not blood work.  This weekend md ordered more labs if he could come in and I asked him to call me and let me know when he can come in

## 2015-10-26 NOTE — Telephone Encounter (Signed)
-----   Message from Lequita Asal, MD sent at 10/24/2015 10:14 PM EDT ----- Regarding: Labs on 09/05  Labs in for 09/05.  Somehow his SPEP did not get done. Ordered as well as a couple others.  Either GI bleeding or myeloma advancing. Ferritin improved little after 3 IV iron. Guaiac cards pending.  M

## 2015-10-26 NOTE — Telephone Encounter (Signed)
I called pt back and got voicemail and [place] thought he was going out of town and the lab that checks his myeloma was left off. So when he rtns on 9/18 and can give me a time I will enter in the time to get labs done at that time. Asked him to call me on my phone and leave a message as to what time he can come on 9/18.

## 2015-10-26 NOTE — Telephone Encounter (Signed)
Pt called back just a few min. Later and will come in 9/18 8:15.

## 2015-10-26 NOTE — Telephone Encounter (Signed)
He got message about an appt, but he is out of town until the 18th . Please call him back

## 2015-11-03 DIAGNOSIS — Z79899 Other long term (current) drug therapy: Secondary | ICD-10-CM | POA: Insufficient documentation

## 2015-11-03 DIAGNOSIS — E7800 Pure hypercholesterolemia, unspecified: Secondary | ICD-10-CM | POA: Insufficient documentation

## 2015-11-03 DIAGNOSIS — Z77090 Contact with and (suspected) exposure to asbestos: Secondary | ICD-10-CM | POA: Diagnosis not present

## 2015-11-03 DIAGNOSIS — I2510 Atherosclerotic heart disease of native coronary artery without angina pectoris: Secondary | ICD-10-CM | POA: Insufficient documentation

## 2015-11-03 DIAGNOSIS — E119 Type 2 diabetes mellitus without complications: Secondary | ICD-10-CM | POA: Insufficient documentation

## 2015-11-03 DIAGNOSIS — I10 Essential (primary) hypertension: Secondary | ICD-10-CM | POA: Diagnosis not present

## 2015-11-03 DIAGNOSIS — D509 Iron deficiency anemia, unspecified: Secondary | ICD-10-CM | POA: Diagnosis not present

## 2015-11-03 DIAGNOSIS — I252 Old myocardial infarction: Secondary | ICD-10-CM | POA: Insufficient documentation

## 2015-11-03 DIAGNOSIS — D696 Thrombocytopenia, unspecified: Secondary | ICD-10-CM | POA: Diagnosis not present

## 2015-11-03 DIAGNOSIS — J849 Interstitial pulmonary disease, unspecified: Secondary | ICD-10-CM | POA: Diagnosis not present

## 2015-11-03 DIAGNOSIS — C9000 Multiple myeloma not having achieved remission: Secondary | ICD-10-CM | POA: Insufficient documentation

## 2015-11-03 DIAGNOSIS — K760 Fatty (change of) liver, not elsewhere classified: Secondary | ICD-10-CM | POA: Insufficient documentation

## 2015-11-03 DIAGNOSIS — K7469 Other cirrhosis of liver: Secondary | ICD-10-CM | POA: Insufficient documentation

## 2015-11-04 DIAGNOSIS — C9000 Multiple myeloma not having achieved remission: Secondary | ICD-10-CM | POA: Diagnosis not present

## 2015-11-06 DIAGNOSIS — C9000 Multiple myeloma not having achieved remission: Secondary | ICD-10-CM | POA: Diagnosis not present

## 2015-11-08 ENCOUNTER — Inpatient Hospital Stay: Payer: PPO | Attending: Hematology and Oncology

## 2015-11-08 ENCOUNTER — Other Ambulatory Visit: Payer: Self-pay

## 2015-11-08 ENCOUNTER — Telehealth: Payer: Self-pay | Admitting: *Deleted

## 2015-11-08 DIAGNOSIS — D649 Anemia, unspecified: Secondary | ICD-10-CM

## 2015-11-08 DIAGNOSIS — C9000 Multiple myeloma not having achieved remission: Secondary | ICD-10-CM | POA: Diagnosis not present

## 2015-11-08 LAB — CBC WITH DIFFERENTIAL/PLATELET
Basophils Absolute: 0 10*3/uL (ref 0–0.1)
Basophils Relative: 1 %
Eosinophils Absolute: 0.4 10*3/uL (ref 0–0.7)
Eosinophils Relative: 8 %
HCT: 28.5 % — ABNORMAL LOW (ref 40.0–52.0)
Hemoglobin: 9.8 g/dL — ABNORMAL LOW (ref 13.0–18.0)
Lymphocytes Relative: 22 %
Lymphs Abs: 1.2 10*3/uL (ref 1.0–3.6)
MCH: 30.3 pg (ref 26.0–34.0)
MCHC: 34.4 g/dL (ref 32.0–36.0)
MCV: 88.2 fL (ref 80.0–100.0)
Monocytes Absolute: 0.6 10*3/uL (ref 0.2–1.0)
Monocytes Relative: 12 %
Neutro Abs: 3.1 10*3/uL (ref 1.4–6.5)
Neutrophils Relative %: 57 %
Platelets: 103 10*3/uL — ABNORMAL LOW (ref 150–440)
RBC: 3.23 MIL/uL — ABNORMAL LOW (ref 4.40–5.90)
RDW: 16.4 % — ABNORMAL HIGH (ref 11.5–14.5)
WBC: 5.5 10*3/uL (ref 3.8–10.6)

## 2015-11-08 LAB — OCCULT BLOOD X 1 CARD TO LAB, STOOL
Fecal Occult Bld: POSITIVE — AB
Fecal Occult Bld: POSITIVE — AB
Fecal Occult Bld: POSITIVE — AB

## 2015-11-08 NOTE — Telephone Encounter (Signed)
Called and spoke with patient and his wife who states GI MD is Medical sales representative in [place].  They are in agreement to have GI consult to determine where patient is losing blood.  Informed her we would contact them and call back with appointment date/time.  Further stated patient had been eating tomatoes and strawberries during the time the occult cards were being obtained.

## 2015-11-08 NOTE — Telephone Encounter (Signed)
-----   Message from Lequita Asal, MD sent at 11/08/2015  9:53 AM EDT ----- Regarding: All guaiac cards positive   Needs follow-up with GI.  M  ----- Message ----- From: Interface, Lab In [facility] Sent: 11/08/2015   8:31 AM To: Lequita Asal, MD

## 2015-11-09 LAB — IGG: IgG (Immunoglobin G), Serum: 2787 mg/dL — ABNORMAL HIGH (ref 700–1600)

## 2015-11-09 LAB — KAPPA/LAMBDA LIGHT CHAINS
Kappa free light chain: 22.1 mg/L — ABNORMAL HIGH (ref 3.3–19.4)
Kappa, lambda light chain ratio: 0.11 — ABNORMAL LOW (ref 0.26–1.65)
Lambda free light chains: 205.5 mg/L — ABNORMAL HIGH (ref 5.7–26.3)

## 2015-11-09 LAB — PROTEIN ELECTROPHORESIS, SERUM
A/G Ratio: 0.8 (ref 0.7–1.7)
Albumin ELP: 3.1 g/dL (ref 2.9–4.4)
Alpha-1-Globulin: 0.3 g/dL (ref 0.0–0.4)
Alpha-2-Globulin: 0.6 g/dL (ref 0.4–1.0)
Beta Globulin: 0.9 g/dL (ref 0.7–1.3)
Gamma Globulin: 2.4 g/dL — ABNORMAL HIGH (ref 0.4–1.8)
Globulin, Total: 4.1 g/dL — ABNORMAL HIGH (ref 2.2–3.9)
M-Spike, %: 2.3 g/dL — ABNORMAL HIGH
Total Protein ELP: 7.2 g/dL (ref 6.0–8.5)

## 2015-11-09 NOTE — Telephone Encounter (Signed)
Called [facility] and spoke to [place] regarding patient being seen by Dr. Hilarie Fredrickson, who is the patient's GI physician.  She states he has an appointment on 12-14-15.  Explained to her that patient has been receiving IV Venofer and his counts continue to drop. Guaiac stools positive.  She states Dr. Hilarie Fredrickson has no openings before 10-24, however their PA can see him on 11-22-15.  I faxed over our last progress note and most recent labs for Dr. Hilarie Fredrickson to review and decide if he feels the patient should be seen earlier. Colletta [place] will be back in touch with [place] after Dr. Hilarie Fredrickson makes decision.

## 2015-11-14 ENCOUNTER — Other Ambulatory Visit: Payer: Self-pay | Admitting: Hematology and Oncology

## 2015-11-14 NOTE — Progress Notes (Signed)
[facility] day:  11/15/15  Chief Complaint: Zachary Buck is a 75 y.o. male  with stage II multiple myeloma and iron deficiency anemia who is seen for 1 month assessment.  HPI: The patient was last seen in the medical oncology clinic on 10/18/2015.  At that time, he felt good  His hematocrit had dropped from 33.7 (07/26/2015) to 27.6 (10/18/2015).  Platelet count had dropped to 97,000.  Ferritin was 39 despite recent IV iron.  He denied any melena or hematochezia.  Additional labs were drawn.  Creatinine was 1.2 (CrCl 57 ml/min) with an albumin of 3.1.  LDH was 162. Coombs was negative. Beta-2 microglobulin was 4.5 (stable).  Ferritin was 39.  Reticulocyte count was 4.7%.  Labs on 11/08/2015 revealed a hematocrit of 28.5, hemoglobin 9.8, MCV 88.2, and platelets 103,000.  SPEP revealed 2.3 g/dL monoclonal spike (stable).  IgG was 2787. Kappa free light chains were 22.1, lambda free light chains 205.5 and a ratio 0.11.  Guaiac cards were positive 3.  Symptomatically, he is felt good for the past 3 days. His wife is concerned about his short-term memory. Diet is poor. He eats "only salads and popsicles".  He will often eat oatmeal for breakfast, soup for lunch, and a salad for dinner.  He eats very little iron rich food.  He has an appointment with Dr. Hilarie Fredrickson, gastroenterologist, on 12/14/2015.  Past Medical History:  Diagnosis Date  . Anemia   . Atherosclerotic heart disease    s/p MI, s/p stent mid circumflex  . CAD (coronary artery disease)   . Cirrhosis ([place])   . Clavicle fracture   . Diabetes mellitus without complication ([place])   . Esophageal varices ([place])   . Hepatic encephalopathy ([place])   . Hypercholesterolemia   . Hypertension   . Internal hemorrhoids   . Leg fracture   . Myocardial infarct ([place])   . Prostate enlargement   . Tubular adenoma of colon     Past Surgical History:  Procedure Laterality Date  . COLONOSCOPY  WITH PROPOFOL N/A 03/04/2015   Procedure: COLONOSCOPY WITH PROPOFOL;  Surgeon: Jerene Bears, MD;  Location: [facility];  Service: Gastroenterology;  Laterality: N/A;  . CORONARY ANGIOPLASTY WITH STENT PLACEMENT    . ESOPHAGOGASTRODUODENOSCOPY (EGD) WITH PROPOFOL N/A 03/04/2015   Procedure: ESOPHAGOGASTRODUODENOSCOPY (EGD) WITH PROPOFOL;  Surgeon: Jerene Bears, MD;  Location: [facility];  Service: Gastroenterology;  Laterality: N/A;  . TONSILLECTOMY      Family History  Problem Relation Age of Onset  . Aneurysm Father 26    of the heart  . Heart attack Father   . Diabetes Mother     Social History:  reports that he has never smoked. He has never used smokeless tobacco. He reports that he does not drink alcohol or use drugs.  Patient denies any exposure to radiation or toxins. The patient is accompanied by his wife, [name], today.  Allergies:  Allergies  Allergen Reactions  . Niaspan [Niacin Er] Other (See Comments)    Just does not want to take     Current Medications: Current Outpatient Prescriptions  Medication Sig Dispense Refill  . Alpha-Lipoic Acid (LIPOIC ACID PO) Take by mouth.    . Ascorbic Acid (VITAMIN C) 1000 MG tablet Take 1,000 mg by mouth daily.    . B Complex Vitamins (VITAMIN B COMPLEX) TABS Take by mouth daily.    . blood glucose meter kit and supplies KIT Dispense based  on patient and insurance preference. Use up to four times daily as directed. (FOR ICD-9 250.00, 250.01). 1 each 0  . blood glucose meter kit and supplies KIT Dispense based on patient and insurance preference. Use up to four times daily as directed. E11.9 Please dispense Freestyle freedom meter and supplies. 1 each 0  . Cholecalciferol (VITAMIN D-3) 1000 UNITS CAPS Take by mouth.    . CHROMIUM PO Take by mouth.    . Cinnamon 500 MG capsule Take 500 mg by mouth.    . Coenzyme Q10 100 MG capsule Take by mouth.    Marland Kitchen glipiZIDE (GLUCOTROL) 5 MG tablet TAKE 1 TABLET BY MOUTH EVERY MORNING AND 2  TABLETS EVERY EVENING 270 tablet 2  . glucose blood (BAYER CONTOUR NEXT TEST) test strip Contour Next EZ test strips: check blood sugars twice a day. Dx: 250.00    . glucose blood (ONE TOUCH ULTRA TEST) test strip Use as instructed 100 each 12  . Lancets (ONETOUCH ULTRASOFT) lancets Use as instructed 100 each 12  . lisinopril (PRINIVIL,ZESTRIL) 20 MG tablet TAKE ONE TABLET BY MOUTH EVERY DAY    . Magnesium 500 MG CAPS Take 500 mg by mouth daily.    . metFORMIN (GLUCOPHAGE) 1000 MG tablet TAKE 1 TABLET(1000 MG) BY MOUTH TWICE DAILY WITH A MEAL 60 tablet 5  . nadolol (CORGARD) 20 MG tablet Take 1 tablet (20 mg total) by mouth daily. (Patient taking differently: Take 10 mg by mouth daily. ) 30 tablet 11  . Omega-3 Fatty Acids (FISH OIL) 1000 MG CAPS Take 4,000 mg by mouth daily.     . rifaximin (XIFAXAN) 550 MG TABS tablet Take 1 tablet (550 mg total) by mouth 2 (two) times daily. 30 tablet 0  . SBI/Protein Isolate (ENTERAGAM) 5 g PACK Take 1 packet by mouth daily. (Patient taking differently: Take 1 packet by mouth as needed. ) 30 each 3  . sildenafil (REVATIO) 20 MG tablet 3 to 5 tablets per day or as instructed    . sildenafil (VIAGRA) 25 MG tablet Take 25 mg by mouth as needed for erectile dysfunction.    . triamcinolone cream (KENALOG) 0.1 % Apply topically 2 (two) times daily. Please avoid face and genitalia area.  Do not use in the same spot for more than 7-10 days in a row. 80 g 0  . Turmeric POWD 4,000 mg by Does not apply route daily.    . vitamin B-12 (CYANOCOBALAMIN) 100 MCG tablet Take 200 mcg by mouth daily.     No current facility-administered medications for this visit.    Facility-Administered Medications Ordered in Other Visits  Medication Dose Route Frequency Provider Last Rate Last Dose  . alteplase (CATHFLO ACTIVASE) injection 2 mg  2 mg Intracatheter Once PRN Lequita Asal, MD      . heparin lock flush 100 unit/mL  500 Units Intracatheter Once PRN Lequita Asal, MD       . heparin lock flush 100 unit/mL  250 Units Intracatheter Once PRN Lequita Asal, MD      . sodium chloride 0.9 % injection 10 mL  10 mL Intracatheter PRN Lequita Asal, MD      . sodium chloride 0.9 % injection 3 mL  3 mL Intravenous Once PRN Lequita Asal, MD        Review of Systems:  GENERAL:  Feels pretty good.  Not tired.  No fevers or sweats.  Weight down 2 pounds. PERFORMANCE STATUS (ECOG):  1 HEENT:  No visual changes, runny nose, sore throat, mouth sores or tenderness. Lungs: No shortness of breath or cough.  No hemoptysis. Cardiac:  No chest pain, palpitations, orthopnea, or PND. GI:  Poor appetite.  No nausea, vomiting, diarrhea, constipation, melena or hematochezia.  Guaiac cards x 3 positive. GU:  No urgency, frequency, dysuria, or hematuria. Musculoskeletal:  No back pain.  No joint pain.  No muscle tenderness. Extremities:  No pain or swelling. Skin:  No rashes or skin changes. Neuro:  Short term memory issues.  No headache, numbness or weakness, balance or coordination issues. Endocrine:  Diabetes.  No thyroid issues, hot flashes or night sweats. Psych:  No mood changes, depression or anxiety. Pain:  No focal pain. Review of systems:  All other systems reviewed and found to be negative.  Physical Exam: BP (!) 145/75 (BP Location: Right Arm, Patient Position: Sitting)   Pulse (!) 56   Temp 97.2 F (36.2 C) (Tympanic)   Resp 18   Wt 179 lb 10.8 oz (81.5 kg)   SpO2 97%   BMI 26.53 kg/m   GENERAL:  Well developed, well nourished, gentleman sitting comfortably in the exam room in no acute distress. MENTAL STATUS:  Alert and oriented to person, place and time. HEAD:  Pearline Cables hair.  Normocephalic, atraumatic, face symmetric, no Cushingoid features. EYES:  Glasses.  Blue eyes.  Pupils equal round and reactive to light and accomodation.  No conjunctivitis or scleral icterus. ENT:  Oropharynx clear without lesion.  Tongue normal. Mucous membranes moist.   RESPIRATORY:  Clear to auscultation without rales, wheezes or rhonchi. CARDIOVASCULAR:  Regular rate and rhythm without murmur, rub or gallop. ABDOMEN:  Soft, non-tender, with active bowel sounds, and no appreciable hepatosplenomegaly.  No masses. SKIN:  No rashes, ulcers or lesions. EXTREMITIES: No edema, no skin discoloration or tenderness.  No palpable cords. LYMPH NODES: No palpable cervical, supraclavicular, axillary or inguinal adenopathy  NEUROLOGICAL: Unremarkable. PSYCH:  Appropriate.    Appointment on 11/15/2015  Component Date Value Ref Range Status  . Sodium 11/15/2015 136  135 - 145 mmol/L Final  . Potassium 11/15/2015 4.9  3.5 - 5.1 mmol/L Final  . Chloride 11/15/2015 107  101 - 111 mmol/L Final  . CO2 11/15/2015 21* 22 - 32 mmol/L Final  . Glucose, Bld 11/15/2015 215* 65 - 99 mg/dL Final  . BUN 11/15/2015 25* 6 - 20 mg/dL Final  . Creatinine, Ser 11/15/2015 1.32* 0.61 - 1.24 mg/dL Final  . Calcium 11/15/2015 9.7  8.9 - 10.3 mg/dL Final  . Total Protein 11/15/2015 7.2  6.5 - 8.1 g/dL Final  . Albumin 11/15/2015 3.0* 3.5 - 5.0 g/dL Final  . AST 11/15/2015 29  15 - 41 U/L Final  . ALT 11/15/2015 25  17 - 63 U/L Final  . Alkaline Phosphatase 11/15/2015 58  38 - 126 U/L Final  . Total Bilirubin 11/15/2015 1.1  0.3 - 1.2 mg/dL Final  . GFR calc non Af Amer 11/15/2015 51* >60 mL/min Final  . GFR calc Af Amer 11/15/2015 59* >60 mL/min Final   Comment: (NOTE) The eGFR has been calculated using the CKD EPI equation. This calculation has not been validated in all clinical situations. eGFR's persistently <60 mL/min signify possible Chronic Kidney Disease.   . Anion gap 11/15/2015 8  5 - 15 Final  . WBC 11/15/2015 6.0  3.8 - 10.6 K/uL Final  . RBC 11/15/2015 3.45* 4.40 - 5.90 MIL/uL Final  . Hemoglobin 11/15/2015 10.9* 13.0 -  18.0 g/dL Final  . HCT 11/15/2015 30.1* 40.0 - 52.0 % Final  . MCV 11/15/2015 87.1  80.0 - 100.0 fL Final  . MCH 11/15/2015 31.6  26.0 - 34.0 pg  Final  . MCHC 11/15/2015 36.3* 32.0 - 36.0 g/dL Final  . RDW 11/15/2015 16.5* 11.5 - 14.5 % Final  . Platelets 11/15/2015 111* 150 - 440 K/uL Final  . Neutrophils Relative % 11/15/2015 59  % Final  . Neutro Abs 11/15/2015 3.5  1.4 - 6.5 K/uL Final  . Lymphocytes Relative 11/15/2015 23  % Final  . Lymphs Abs 11/15/2015 1.3  1.0 - 3.6 K/uL Final  . Monocytes Relative 11/15/2015 10  % Final  . Monocytes Absolute 11/15/2015 0.6  0.2 - 1.0 K/uL Final  . Eosinophils Relative 11/15/2015 8  % Final  . Eosinophils Absolute 11/15/2015 0.5  0 - 0.7 K/uL Final  . Basophils Relative 11/15/2015 0  % Final  . Basophils Absolute 11/15/2015 0.0  0 - 0.1 K/uL Final  . Ferritin 11/15/2015 18* 24 - 336 ng/mL Final  . Iron 11/15/2015 65  45 - 182 ug/dL Final  . TIBC 11/15/2015 318  250 - 450 ug/dL Final  . Saturation Ratios 11/15/2015 21  17.9 - 39.5 % Final  . UIBC 11/15/2015 253  ug/dL Final    Assessment:  JARRYN ALTLAND is a 75 y.o. male with iron deficiency anemia and stage II multiple myeloma. 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 Serum immunoglobulins noted an IgG of 2930 (high). 24-hour urine revealed a 8.8% monoclonal protein (10.6 mg/24 hours).  Albumen was 3.3 on 08/26/2014. He had pneumonia in 05/2014.   Bone survey on 10/20/2014 revealed no lytic lesions. Bone survey on 09/16/2015 revealed no focal lytic lesion or acute bony abnormality.   SPEP has been stable: 2.3 g/dL on 10/09/2014, 01/18/2015, 03/22/2015, 06/18/2015, 09/13/2015, and 11/08/2015.  Lambda free light chains were 155.3 (ratio 0.15) on 01/18/2015, 174.59 (ratio of 0.16) on 06/18/2015, 182.9 (ratio of 0.13) on 09/13/2015, and 205 (ratio 0.11) on 11/08/2015.  Bone marrow biopsy on 11/10/2014 revealed a 10% monoclonal plasma cell infiltrate. Marrow was hypercellular for age (40-50%) with mixed maturing hematopoiesis, relative  erythroid hyperplasia and mild nonspecific dyserythropoiesis. There was patchy mild focally moderate increase in reticulin. There were no significant iron stores. Myeloma FISH panel revealed translocation (11;14) which results in fusion of CCND1 (BCL1) at 11q13 with the immunoglobulin heavy chain gene (IgH) at 14q32 (a favorable prognostic feature).   Beta2 microglobulin was 4.2 on 11/24/2014, 3.8 on 06/18/2015, and 4.9 on 10/18/2015.  PET scan on 12/07/2014 revealed no hypermetabolic foci within the marrow space or nodal stations to suggest active multiple myeloma. There was hypermetabolism within the right lower lobe, corresponding to a similar dependent density. Morphology favored scarring.There was cirrhosis and portal hypertension. There was gastric body hypermetabolism favoring physiologic. Spleen was 15.3 cm (volume 1065 cc).  There was pulmonary artery enlargement suggesting pulmonary artery hypertension. 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  He has a history of asbestos exposure in the [employer].  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  Abdominal CT scan on 03/16/2015 revealed splenomegaly (14.5 cm), cirrhosis, and stigmata of portal hypertension with esophageal and gastric varices.  There were no liver lesions.  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  There were no pleural effusions.  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  There was no frank honeycombing. This finding accounted for the medial right lower lobe hypermetabolism on the 12/07/2014 PET-CT.  There was cirrhosis, splenomegaly, and prominent gastroesophageal varices.  He has had mild anemia since 10/2013 and mild thrombocytopenia since  05/2014. Colonoscopy in 10/2013 revealed polyps. EGD revealed gastritis. Capsule enteroscopy on 11/27/2014 revealed 1-2 gastric polyps and one colon polyp.  There were no findings to explain iron deficiency anemia.  His diet was good. Labs confirmed iron deficiency (ferritin 11.8 on 05/2014 and 13 on 10/09/2014). B12 was low normal (MMA normal) thus ruling out B12 deficiency.  Colonoscopy on 03/04/2015 revealed a sessile polyp was seen in the transverse colon (tubular adenoma). EGD on 03/04/2015 revealed medium-sized esophageal varices in the mid and distal esophagus without bleeding. There was a sessile polyp in the gastric antrum (hyperplastic polyp).  There were 3 small angiodysplastic lesions in the second part of the duodenum.  It was recommended that he continue Protonix.  He is on nadolol.  He was told that the etiology of his cirrhosis was fatty liver.  He has had a capsule study.  He stopped his Protonix secondary to pain in his stomach. He stopped his pancreatic enzymes. He notes diarrhea for a long time (6 months to 2 years).  Diarrhea has improved on Enterogam (began 06/22/2015).  He has not had diarrhea in awhile.  He has recurrent iron deficiency anemia.  He received 200 mg Venofer weekly 3 (03/26/2015 - 04/14/2015), weekly x 3 (06/25/2015 - 07/09/2015), and weekly x 3 (09/17/2015 - 10/01/2015).  Ferritin was 23 on 06/18/2015, 121 on 07/26/2015, 17 on 09/13/2015, and 18 on 11/15/2015.  Symptomatically, he feels fairly good.  Diet is poor.  He has anemia and guaiac positive stools.  Iron stores are low despite IV iron.  Plan: 1.  Labs today:  CBC with diff, CMP, ferritin, iron studies. 2.  Discuss diet and food choices.  Recommend follow-up with nutrition.  Patient declines. 3.  Discuss follow-up with GI secondary to persistent iron deficiency anemia and guaiac + stools. 4.  Begin weekly Venofer x 3 tomorrow. 5.  Discuss stability in monoclonal protein.  Continue to monitor  closely. 6.  RTC in 1 month for MD assessment, labs (CBC, CMP, SPEP, ferritin- day before), and +/- Venofer.   Lequita Asal, MD  11/15/2015, 11:26 PM

## 2015-11-15 ENCOUNTER — Other Ambulatory Visit: Payer: Self-pay | Admitting: *Deleted

## 2015-11-15 ENCOUNTER — Inpatient Hospital Stay (HOSPITAL_BASED_OUTPATIENT_CLINIC_OR_DEPARTMENT_OTHER): Payer: PPO | Admitting: Hematology and Oncology

## 2015-11-15 ENCOUNTER — Encounter: Payer: Self-pay | Admitting: Hematology and Oncology

## 2015-11-15 ENCOUNTER — Inpatient Hospital Stay: Payer: PPO

## 2015-11-15 ENCOUNTER — Telehealth: Payer: Self-pay | Admitting: *Deleted

## 2015-11-15 VITALS — BP 145/75 | HR 56 | Temp 97.2°F | Resp 18 | Wt 179.7 lb

## 2015-11-15 DIAGNOSIS — C9000 Multiple myeloma not having achieved remission: Secondary | ICD-10-CM | POA: Diagnosis not present

## 2015-11-15 DIAGNOSIS — K7469 Other cirrhosis of liver: Secondary | ICD-10-CM | POA: Diagnosis not present

## 2015-11-15 DIAGNOSIS — D696 Thrombocytopenia, unspecified: Secondary | ICD-10-CM

## 2015-11-15 DIAGNOSIS — K760 Fatty (change of) liver, not elsewhere classified: Secondary | ICD-10-CM

## 2015-11-15 DIAGNOSIS — Z79899 Other long term (current) drug therapy: Secondary | ICD-10-CM

## 2015-11-15 DIAGNOSIS — D509 Iron deficiency anemia, unspecified: Secondary | ICD-10-CM

## 2015-11-15 DIAGNOSIS — R195 Other fecal abnormalities: Secondary | ICD-10-CM

## 2015-11-15 DIAGNOSIS — Z77090 Contact with and (suspected) exposure to asbestos: Secondary | ICD-10-CM

## 2015-11-15 DIAGNOSIS — J849 Interstitial pulmonary disease, unspecified: Secondary | ICD-10-CM

## 2015-11-15 LAB — COMPREHENSIVE METABOLIC PANEL
ALT: 25 U/L (ref 17–63)
AST: 29 U/L (ref 15–41)
Albumin: 3 g/dL — ABNORMAL LOW (ref 3.5–5.0)
Alkaline Phosphatase: 58 U/L (ref 38–126)
Anion gap: 8 (ref 5–15)
BUN: 25 mg/dL — ABNORMAL HIGH (ref 6–20)
CO2: 21 mmol/L — ABNORMAL LOW (ref 22–32)
Calcium: 9.7 mg/dL (ref 8.9–10.3)
Chloride: 107 mmol/L (ref 101–111)
Creatinine, Ser: 1.32 mg/dL — ABNORMAL HIGH (ref 0.61–1.24)
GFR calc Af Amer: 59 mL/min — ABNORMAL LOW (ref >60)
GFR calc non Af Amer: 51 mL/min — ABNORMAL LOW (ref >60)
Glucose, Bld: 215 mg/dL — ABNORMAL HIGH (ref 65–99)
Potassium: 4.9 mmol/L (ref 3.5–5.1)
Sodium: 136 mmol/L (ref 135–145)
Total Bilirubin: 1.1 mg/dL (ref 0.3–1.2)
Total Protein: 7.2 g/dL (ref 6.5–8.1)

## 2015-11-15 LAB — CBC WITH DIFFERENTIAL/PLATELET
Basophils Absolute: 0 10*3/uL (ref 0–0.1)
Basophils Relative: 0 %
Eosinophils Absolute: 0.5 10*3/uL (ref 0–0.7)
Eosinophils Relative: 8 %
HCT: 30.1 % — ABNORMAL LOW (ref 40.0–52.0)
Hemoglobin: 10.9 g/dL — ABNORMAL LOW (ref 13.0–18.0)
Lymphocytes Relative: 23 %
Lymphs Abs: 1.3 10*3/uL (ref 1.0–3.6)
MCH: 31.6 pg (ref 26.0–34.0)
MCHC: 36.3 g/dL — ABNORMAL HIGH (ref 32.0–36.0)
MCV: 87.1 fL (ref 80.0–100.0)
Monocytes Absolute: 0.6 10*3/uL (ref 0.2–1.0)
Monocytes Relative: 10 %
Neutro Abs: 3.5 10*3/uL (ref 1.4–6.5)
Neutrophils Relative %: 59 %
Platelets: 111 10*3/uL — ABNORMAL LOW (ref 150–440)
RBC: 3.45 MIL/uL — ABNORMAL LOW (ref 4.40–5.90)
RDW: 16.5 % — ABNORMAL HIGH (ref 11.5–14.5)
WBC: 6 10*3/uL (ref 3.8–10.6)

## 2015-11-15 LAB — FERRITIN: Ferritin: 18 ng/mL — ABNORMAL LOW (ref 24–336)

## 2015-11-15 LAB — IRON AND TIBC
Iron: 65 ug/dL (ref 45–182)
Saturation Ratios: 21 % (ref 17.9–39.5)
TIBC: 318 ug/dL (ref 250–450)
UIBC: 253 ug/dL

## 2015-11-15 NOTE — Telephone Encounter (Signed)
-----   Message from Lequita Asal, MD sent at 11/15/2015 11:42 AM EDT ----- Regarding: Please notify patient  Ferritin low low.  IV iron x 3-4  M  ----- Message ----- From: Interface, Lab In [place] Sent: 11/15/2015   8:57 AM To: Lequita Asal, MD

## 2015-11-15 NOTE — Telephone Encounter (Signed)
Called pt to let him know ferritin low 18, per md pt will need 4 treatments of venofer. He is coming tom for 1 dose and he will get the other appts when he comes tom. He is agreeable to the plan.

## 2015-11-16 ENCOUNTER — Other Ambulatory Visit: Payer: Self-pay | Admitting: *Deleted

## 2015-11-16 ENCOUNTER — Inpatient Hospital Stay: Payer: PPO

## 2015-11-16 ENCOUNTER — Encounter: Payer: Self-pay | Admitting: *Deleted

## 2015-11-16 VITALS — BP 159/75 | HR 58 | Temp 95.5°F | Resp 20

## 2015-11-16 DIAGNOSIS — D500 Iron deficiency anemia secondary to blood loss (chronic): Secondary | ICD-10-CM

## 2015-11-16 DIAGNOSIS — R195 Other fecal abnormalities: Secondary | ICD-10-CM

## 2015-11-16 DIAGNOSIS — D509 Iron deficiency anemia, unspecified: Secondary | ICD-10-CM

## 2015-11-16 DIAGNOSIS — C9000 Multiple myeloma not having achieved remission: Secondary | ICD-10-CM | POA: Diagnosis not present

## 2015-11-16 MED ORDER — SODIUM CHLORIDE 0.9 % IV SOLN
Freq: Once | INTRAVENOUS | Status: AC
  Administered 2015-11-16: 10:00:00 via INTRAVENOUS
  Filled 2015-11-16: qty 1000

## 2015-11-16 MED ORDER — SODIUM CHLORIDE 0.9 % IV SOLN
200.0000 mg | Freq: Once | INTRAVENOUS | Status: AC
  Administered 2015-11-16: 200 mg via INTRAVENOUS
  Filled 2015-11-16: qty 10

## 2015-11-17 ENCOUNTER — Encounter: Payer: Self-pay | Admitting: Internal Medicine

## 2015-11-17 NOTE — Telephone Encounter (Signed)
rcvd a fax back from Zachary Buck office stating that he would benefit from having more venofer and pt will be seen at his appt in late October and this fax was given to [facility] and pt was set up for venofer beginning 9/26

## 2015-11-18 ENCOUNTER — Telehealth: Payer: Self-pay | Admitting: *Deleted

## 2015-11-18 MED ORDER — GLUCOSE BLOOD VI STRP: ORAL_STRIP | 12 refills | Status: AC

## 2015-11-18 NOTE — Telephone Encounter (Signed)
Wife called me about does he have gi appt. I called and checked on it today and they will call him in next 1-2 days with appt.  I gave her numbers for [place] and [place] office. She states he wa little dizzy today and asked if it could be from venofer and it does have poss. 1-7 % but not likely and he had got venofer several times before this week. He is eating well and not drinking good and I encouraged to make him drink more fluids and she left him in his recliner with bottle of smart water

## 2015-11-18 NOTE — Telephone Encounter (Signed)
Sent to pharmacy 

## 2015-11-19 ENCOUNTER — Telehealth: Payer: Self-pay

## 2015-11-19 ENCOUNTER — Other Ambulatory Visit: Payer: Self-pay

## 2015-11-19 NOTE — Telephone Encounter (Signed)
D50.9 - Iron deficiency anemia R19.5 - Heme positive stool [employer]  [facility] [id number] Pre cert

## 2015-11-19 NOTE — Telephone Encounter (Signed)
Fill out silver back form and fax for pre cert.  Procedure date is 12/09/2015

## 2015-11-19 NOTE — Telephone Encounter (Signed)
Gastroenterology Pre-Procedure Review  Request Date: 12/09/2015 Requesting Physician:  PATIENT REVIEW QUESTIONS: The patient responded to the following health history questions as indicated:    1. Are you having any GI issues? no 2. Do you have a personal history of Polyps? yes (precancerous) 3. Do you have a family history of Colon Cancer or Polyps? no 4. Diabetes Mellitus? yes (Type 2) 5. Joint replacements in the past 12 months?no 6. Major health problems in the past 3 months?no 7. Any artificial heart valves, MVP, or defibrillator?no    MEDICATIONS & ALLERGIES:    Patient reports the following regarding taking any anticoagulation/antiplatelet therapy:   Plavix, Coumadin, Eliquis, Xarelto, Lovenox, Pradaxa, Brilinta, or Effient? no Aspirin? no  Patient confirms/reports the following medications:  Current Outpatient Prescriptions  Medication Sig Dispense Refill  . Alpha-Lipoic Acid (LIPOIC ACID PO) Take by mouth.    . Ascorbic Acid (VITAMIN C) 1000 MG tablet Take 1,000 mg by mouth daily.    . B Complex Vitamins (VITAMIN B COMPLEX) TABS Take by mouth daily.    . blood glucose meter kit and supplies KIT Dispense based on patient and insurance preference. Use up to four times daily as directed. (FOR ICD-9 250.00, 250.01). 1 each 0  . blood glucose meter kit and supplies KIT Dispense based on patient and insurance preference. Use up to four times daily as directed. E11.9 Please dispense Freestyle freedom meter and supplies. 1 each 0  . Cholecalciferol (VITAMIN D-3) 1000 UNITS CAPS Take by mouth.    . CHROMIUM PO Take by mouth.    . Cinnamon 500 MG capsule Take 500 mg by mouth.    . Coenzyme Q10 100 MG capsule Take by mouth.    Marland Kitchen glipiZIDE (GLUCOTROL) 5 MG tablet TAKE 1 TABLET BY MOUTH EVERY MORNING AND 2 TABLETS EVERY EVENING 270 tablet 2  . glucose blood (BAYER CONTOUR NEXT TEST) test strip Contour Next EZ test strips: check blood sugars twice a day. Dx: 250.00    . glucose blood  (ONE TOUCH ULTRA TEST) test strip Use as instructed 100 each 12  . Lancets (ONETOUCH ULTRASOFT) lancets Use as instructed 100 each 12  . lisinopril (PRINIVIL,ZESTRIL) 20 MG tablet TAKE ONE TABLET BY MOUTH EVERY DAY    . Magnesium 500 MG CAPS Take 500 mg by mouth daily.    . metFORMIN (GLUCOPHAGE) 1000 MG tablet TAKE 1 TABLET(1000 MG) BY MOUTH TWICE DAILY WITH A MEAL 60 tablet 5  . nadolol (CORGARD) 20 MG tablet Take 1 tablet (20 mg total) by mouth daily. (Patient taking differently: Take 10 mg by mouth daily. ) 30 tablet 11  . Omega-3 Fatty Acids (FISH OIL) 1000 MG CAPS Take 4,000 mg by mouth daily.     . SBI/Protein Isolate (ENTERAGAM) 5 g PACK Take 1 packet by mouth daily. (Patient taking differently: Take 1 packet by mouth as needed. ) 30 each 3  . sildenafil (REVATIO) 20 MG tablet 3 to 5 tablets per day or as instructed    . triamcinolone cream (KENALOG) 0.1 % Apply topically 2 (two) times daily. Please avoid face and genitalia area.  Do not use in the same spot for more than 7-10 days in a row. 80 g 0  . Turmeric POWD 4,000 mg by Does not apply route daily.    . vitamin B-12 (CYANOCOBALAMIN) 100 MCG tablet Take 200 mcg by mouth daily.     No current facility-administered medications for this visit.    Facility-Administered Medications Ordered in Other  Visits  Medication Dose Route Frequency Provider Last Rate Last Dose  . alteplase (CATHFLO ACTIVASE) injection 2 mg  2 mg Intracatheter Once PRN Lequita Asal, MD      . heparin lock flush 100 unit/mL  500 Units Intracatheter Once PRN Lequita Asal, MD      . heparin lock flush 100 unit/mL  250 Units Intracatheter Once PRN Lequita Asal, MD      . sodium chloride 0.9 % injection 10 mL  10 mL Intracatheter PRN Lequita Asal, MD      . sodium chloride 0.9 % injection 3 mL  3 mL Intravenous Once PRN Lequita Asal, MD        Patient confirms/reports the following allergies:  Allergies  Allergen Reactions  . Niaspan  [Niacin Er] Other (See Comments)    Just does not want to take     No orders of the defined types were placed in this encounter.   AUTHORIZATION INFORMATION Primary Insurance: 1D#: Group #:  Secondary Insurance: 1D#: Group #:  SCHEDULE INFORMATION: Date: 12/09/2015 Time: Location: [facility]

## 2015-11-23 ENCOUNTER — Other Ambulatory Visit: Payer: Self-pay | Admitting: Family Medicine

## 2015-11-23 ENCOUNTER — Inpatient Hospital Stay: Payer: PPO | Attending: Oncology

## 2015-11-23 VITALS — BP 133/63 | HR 49 | Temp 97.0°F | Resp 20

## 2015-11-23 DIAGNOSIS — C9000 Multiple myeloma not having achieved remission: Secondary | ICD-10-CM | POA: Insufficient documentation

## 2015-11-23 DIAGNOSIS — I10 Essential (primary) hypertension: Secondary | ICD-10-CM | POA: Insufficient documentation

## 2015-11-23 DIAGNOSIS — D696 Thrombocytopenia, unspecified: Secondary | ICD-10-CM | POA: Diagnosis not present

## 2015-11-23 DIAGNOSIS — D500 Iron deficiency anemia secondary to blood loss (chronic): Secondary | ICD-10-CM

## 2015-11-23 DIAGNOSIS — Z79899 Other long term (current) drug therapy: Secondary | ICD-10-CM | POA: Diagnosis not present

## 2015-11-23 DIAGNOSIS — K7469 Other cirrhosis of liver: Secondary | ICD-10-CM | POA: Insufficient documentation

## 2015-11-23 DIAGNOSIS — K760 Fatty (change of) liver, not elsewhere classified: Secondary | ICD-10-CM | POA: Diagnosis not present

## 2015-11-23 DIAGNOSIS — I2510 Atherosclerotic heart disease of native coronary artery without angina pectoris: Secondary | ICD-10-CM | POA: Diagnosis not present

## 2015-11-23 DIAGNOSIS — E7800 Pure hypercholesterolemia, unspecified: Secondary | ICD-10-CM | POA: Diagnosis not present

## 2015-11-23 DIAGNOSIS — D509 Iron deficiency anemia, unspecified: Secondary | ICD-10-CM | POA: Diagnosis not present

## 2015-11-23 DIAGNOSIS — Z77090 Contact with and (suspected) exposure to asbestos: Secondary | ICD-10-CM | POA: Insufficient documentation

## 2015-11-23 DIAGNOSIS — J849 Interstitial pulmonary disease, unspecified: Secondary | ICD-10-CM | POA: Insufficient documentation

## 2015-11-23 DIAGNOSIS — I252 Old myocardial infarction: Secondary | ICD-10-CM | POA: Insufficient documentation

## 2015-11-23 DIAGNOSIS — E119 Type 2 diabetes mellitus without complications: Secondary | ICD-10-CM | POA: Diagnosis not present

## 2015-11-23 MED ORDER — SODIUM CHLORIDE 0.9 % IV SOLN
Freq: Once | INTRAVENOUS | Status: AC
  Administered 2015-11-23: 09:00:00 via INTRAVENOUS
  Filled 2015-11-23: qty 1000

## 2015-11-23 MED ORDER — IRON SUCROSE 20 MG/ML IV SOLN
200.0000 mg | Freq: Once | INTRAVENOUS | Status: AC
  Administered 2015-11-23: 200 mg via INTRAVENOUS
  Filled 2015-11-23: qty 10

## 2015-11-30 ENCOUNTER — Inpatient Hospital Stay: Payer: PPO

## 2015-11-30 VITALS — BP 153/70 | HR 57 | Resp 20

## 2015-11-30 DIAGNOSIS — C9000 Multiple myeloma not having achieved remission: Secondary | ICD-10-CM | POA: Diagnosis not present

## 2015-11-30 DIAGNOSIS — D500 Iron deficiency anemia secondary to blood loss (chronic): Secondary | ICD-10-CM

## 2015-11-30 MED ORDER — SODIUM CHLORIDE 0.9 % IV SOLN
Freq: Once | INTRAVENOUS | Status: AC
  Administered 2015-11-30: 09:00:00 via INTRAVENOUS
  Filled 2015-11-30: qty 1000

## 2015-11-30 MED ORDER — SODIUM CHLORIDE 0.9 % IV SOLN
200.0000 mg | Freq: Once | INTRAVENOUS | Status: DC
  Filled 2015-11-30: qty 10

## 2015-11-30 MED ORDER — IRON SUCROSE 20 MG/ML IV SOLN
200.0000 mg | Freq: Once | INTRAVENOUS | Status: AC
  Administered 2015-11-30: 200 mg via INTRAVENOUS
  Filled 2015-11-30: qty 10

## 2015-12-07 ENCOUNTER — Ambulatory Visit: Payer: PPO

## 2015-12-07 NOTE — Discharge Instructions (Signed)

## 2015-12-08 NOTE — Anesthesia Preprocedure Evaluation (Addendum)
Anesthesia Evaluation  Patient identified by MRN, date of birth, ID band Patient awake    Reviewed: Allergy & Precautions, H&P , NPO status , Patient's Chart, lab work & pertinent test results, reviewed documented beta blocker date and time   Airway Mallampati: II  TM Distance: >3 FB Neck ROM: full    Dental no notable dental hx.    Pulmonary neg pulmonary ROS,    Pulmonary exam normal breath sounds clear to auscultation       Cardiovascular Exercise Tolerance: Good hypertension, + CAD, + Past MI and + Cardiac Stents  negative cardio ROS   Rhythm:regular Rate:Normal     Neuro/Psych negative neurological ROS  negative psych ROS   GI/Hepatic negative GI ROS, Neg liver ROS, (+) Cirrhosis       , Hepatitis -  Endo/Other  negative endocrine ROSdiabetes  Renal/GU Renal diseasenegative Renal ROS  negative genitourinary   Musculoskeletal   Abdominal   Peds  Hematology negative hematology ROS (+) Blood dyscrasia, , Multiple Myeloma   Anesthesia Other Findings   Reproductive/Obstetrics negative OB ROS                           Anesthesia Physical Anesthesia Plan  ASA: III  Anesthesia Plan: MAC   Post-op Pain Management:    Induction:   Airway Management Planned:   Additional Equipment:   Intra-op Plan:   Post-operative Plan:   Informed Consent: I have reviewed the patients History and Physical, chart, labs and discussed the procedure including the risks, benefits and alternatives for the proposed anesthesia with the patient or authorized representative who has indicated his/her understanding and acceptance.   Dental Advisory Given  Plan Discussed with: CRNA and Anesthesiologist  Anesthesia Plan Comments:       Anesthesia Quick Evaluation

## 2015-12-09 ENCOUNTER — Encounter
Admission: RE | Disposition: A | Discharge status: Home / Self Care | Payer: Self-pay | Source: Ambulatory Visit | Attending: Gastroenterology

## 2015-12-09 ENCOUNTER — Ambulatory Visit
Admission: RE | Admit: 2015-12-09 | Discharge: 2015-12-09 | Disposition: A | Discharge status: Home / Self Care | Payer: PPO | Source: Ambulatory Visit | Attending: Gastroenterology | Admitting: Gastroenterology

## 2015-12-09 ENCOUNTER — Ambulatory Visit: Payer: PPO | Admitting: Anesthesiology

## 2015-12-09 ENCOUNTER — Telehealth: Payer: Self-pay | Admitting: *Deleted

## 2015-12-09 ENCOUNTER — Telehealth: Payer: Self-pay | Admitting: Gastroenterology

## 2015-12-09 DIAGNOSIS — C9000 Multiple myeloma not having achieved remission: Secondary | ICD-10-CM | POA: Diagnosis not present

## 2015-12-09 DIAGNOSIS — K7290 Hepatic failure, unspecified without coma: Secondary | ICD-10-CM | POA: Insufficient documentation

## 2015-12-09 DIAGNOSIS — K317 Polyp of stomach and duodenum: Secondary | ICD-10-CM | POA: Insufficient documentation

## 2015-12-09 DIAGNOSIS — K31811 Angiodysplasia of stomach and duodenum with bleeding: Secondary | ICD-10-CM | POA: Diagnosis not present

## 2015-12-09 DIAGNOSIS — E7800 Pure hypercholesterolemia, unspecified: Secondary | ICD-10-CM | POA: Insufficient documentation

## 2015-12-09 DIAGNOSIS — K641 Second degree hemorrhoids: Secondary | ICD-10-CM | POA: Insufficient documentation

## 2015-12-09 DIAGNOSIS — K635 Polyp of colon: Secondary | ICD-10-CM

## 2015-12-09 DIAGNOSIS — Z86010 Personal history of colonic polyps: Secondary | ICD-10-CM | POA: Diagnosis not present

## 2015-12-09 DIAGNOSIS — Z955 Presence of coronary angioplasty implant and graft: Secondary | ICD-10-CM | POA: Insufficient documentation

## 2015-12-09 DIAGNOSIS — D500 Iron deficiency anemia secondary to blood loss (chronic): Secondary | ICD-10-CM | POA: Diagnosis not present

## 2015-12-09 DIAGNOSIS — I8500 Esophageal varices without bleeding: Secondary | ICD-10-CM | POA: Insufficient documentation

## 2015-12-09 DIAGNOSIS — D125 Benign neoplasm of sigmoid colon: Secondary | ICD-10-CM | POA: Diagnosis not present

## 2015-12-09 DIAGNOSIS — Z8249 Family history of ischemic heart disease and other diseases of the circulatory system: Secondary | ICD-10-CM | POA: Diagnosis not present

## 2015-12-09 DIAGNOSIS — I2510 Atherosclerotic heart disease of native coronary artery without angina pectoris: Secondary | ICD-10-CM | POA: Diagnosis not present

## 2015-12-09 DIAGNOSIS — Z79899 Other long term (current) drug therapy: Secondary | ICD-10-CM | POA: Insufficient documentation

## 2015-12-09 DIAGNOSIS — I8510 Secondary esophageal varices without bleeding: Secondary | ICD-10-CM | POA: Diagnosis not present

## 2015-12-09 DIAGNOSIS — I10 Essential (primary) hypertension: Secondary | ICD-10-CM | POA: Insufficient documentation

## 2015-12-09 DIAGNOSIS — D122 Benign neoplasm of ascending colon: Secondary | ICD-10-CM

## 2015-12-09 DIAGNOSIS — K7460 Unspecified cirrhosis of liver: Secondary | ICD-10-CM | POA: Diagnosis not present

## 2015-12-09 DIAGNOSIS — N400 Enlarged prostate without lower urinary tract symptoms: Secondary | ICD-10-CM | POA: Insufficient documentation

## 2015-12-09 DIAGNOSIS — D509 Iron deficiency anemia, unspecified: Secondary | ICD-10-CM | POA: Diagnosis not present

## 2015-12-09 DIAGNOSIS — Z7984 Long term (current) use of oral hypoglycemic drugs: Secondary | ICD-10-CM | POA: Insufficient documentation

## 2015-12-09 DIAGNOSIS — Z888 Allergy status to other drugs, medicaments and biological substances status: Secondary | ICD-10-CM | POA: Insufficient documentation

## 2015-12-09 DIAGNOSIS — Z833 Family history of diabetes mellitus: Secondary | ICD-10-CM | POA: Diagnosis not present

## 2015-12-09 DIAGNOSIS — I252 Old myocardial infarction: Secondary | ICD-10-CM | POA: Insufficient documentation

## 2015-12-09 DIAGNOSIS — E119 Type 2 diabetes mellitus without complications: Secondary | ICD-10-CM | POA: Diagnosis not present

## 2015-12-09 HISTORY — PX: COLONOSCOPY WITH PROPOFOL: SHX5780

## 2015-12-09 HISTORY — DX: Unspecified cirrhosis of liver: K74.60

## 2015-12-09 HISTORY — PX: ESOPHAGOGASTRODUODENOSCOPY (EGD) WITH PROPOFOL: SHX5813

## 2015-12-09 HISTORY — PX: POLYPECTOMY: SHX5525

## 2015-12-09 LAB — GLUCOSE, CAPILLARY
Glucose-Capillary: 161 mg/dL — ABNORMAL HIGH (ref 65–99)
Glucose-Capillary: 164 mg/dL — ABNORMAL HIGH (ref 65–99)

## 2015-12-09 SURGERY — COLONOSCOPY WITH PROPOFOL
Anesthesia: Monitor Anesthesia Care | Wound class: Contaminated

## 2015-12-09 MED ORDER — ACETAMINOPHEN 160 MG/5ML PO SOLN: 325.0000 mg | ORAL | Status: DC | PRN

## 2015-12-09 MED ORDER — LIDOCAINE HCL (CARDIAC) 20 MG/ML IV SOLN
INTRAVENOUS | Status: DC | PRN
  Administered 2015-12-09: 50 mg via INTRAVENOUS

## 2015-12-09 MED ORDER — PROPOFOL 10 MG/ML IV BOLUS
INTRAVENOUS | Status: DC | PRN
  Administered 2015-12-09: 50 mg via INTRAVENOUS
  Administered 2015-12-09: 20 mg via INTRAVENOUS
  Administered 2015-12-09: 30 mg via INTRAVENOUS
  Administered 2015-12-09 (×8): 50 mg via INTRAVENOUS
  Administered 2015-12-09: 20 mg via INTRAVENOUS
  Administered 2015-12-09: 50 mg via INTRAVENOUS
  Administered 2015-12-09: 30 mg via INTRAVENOUS
  Administered 2015-12-09: 50 mg via INTRAVENOUS

## 2015-12-09 MED ORDER — ACETAMINOPHEN 325 MG PO TABS: 325.0000 mg | ORAL_TABLET | ORAL | Status: DC | PRN

## 2015-12-09 MED ORDER — GLYCOPYRROLATE 0.2 MG/ML IJ SOLN
INTRAMUSCULAR | Status: DC | PRN
  Administered 2015-12-09 (×2): 0.2 mg via INTRAVENOUS

## 2015-12-09 MED ORDER — STERILE WATER FOR IRRIGATION IR SOLN
Status: DC | PRN
  Administered 2015-12-09: 5 mL

## 2015-12-09 MED ORDER — LACTATED RINGERS IV SOLN
INTRAVENOUS | Status: DC
  Administered 2015-12-09: 07:00:00 via INTRAVENOUS

## 2015-12-09 SURGICAL SUPPLY — 37 items
BALLN DILATOR 10-12 8 (BALLOONS)
BALLN DILATOR 12-15 8 (BALLOONS)
BALLN DILATOR 15-18 8 (BALLOONS)
BALLN DILATOR CRE 0-12 8 (BALLOONS)
BALLN DILATOR ESOPH 8 10 CRE (MISCELLANEOUS) IMPLANT
BALLOON DILATOR 12-15 8 (BALLOONS) IMPLANT
BALLOON DILATOR 15-18 8 (BALLOONS) IMPLANT
BALLOON DILATOR CRE 0-12 8 (BALLOONS) IMPLANT
BLOCK BITE 60FR ADLT L/F GRN (MISCELLANEOUS) ×3 IMPLANT
CANISTER SUCT 1200ML W/VALVE (MISCELLANEOUS) ×3 IMPLANT
CLIP HMST 235XBRD CATH ROT (MISCELLANEOUS) ×3 IMPLANT
CLIP RESOLUTION 360 11X235 (MISCELLANEOUS) ×6
FCP ESCP3.2XJMB 240X2.8X (MISCELLANEOUS)
FORCEPS BIOP RAD 4 LRG CAP 4 (CUTTING FORCEPS) ×3 IMPLANT
FORCEPS BIOP RJ4 240 W/NDL (MISCELLANEOUS)
FORCEPS ESCP3.2XJMB 240X2.8X (MISCELLANEOUS) IMPLANT
GOWN CVR UNV OPN BCK APRN NK (MISCELLANEOUS) ×2 IMPLANT
GOWN ISOL THUMB LOOP REG UNIV (MISCELLANEOUS) ×4
INJECTOR VARIJECT VIN23 (MISCELLANEOUS) IMPLANT
KIT DEFENDO VALVE AND CONN (KITS) IMPLANT
KIT ENDO PROCEDURE OLY (KITS) ×3 IMPLANT
MARKER SPOT ENDO TATTOO 5ML (MISCELLANEOUS) IMPLANT
PAD GROUND ADULT SPLIT (MISCELLANEOUS) ×3 IMPLANT
PROBE APC STR FIRE (PROBE) IMPLANT
PROBE INJECTION GOLD (MISCELLANEOUS) ×2
PROBE INJECTION GOLD 7FR (MISCELLANEOUS) ×1 IMPLANT
RETRIEVER NET PLAT FOOD (MISCELLANEOUS) IMPLANT
RETRIEVER NET ROTH 2.5X230 LF (MISCELLANEOUS) IMPLANT
SNARE SHORT THROW 13M SML OVAL (MISCELLANEOUS) ×3 IMPLANT
SNARE SHORT THROW 30M LRG OVAL (MISCELLANEOUS) IMPLANT
SNARE SNG USE RND 15MM (INSTRUMENTS) IMPLANT
SPOT EX ENDOSCOPIC TATTOO (MISCELLANEOUS)
SYR INFLATION 60ML (SYRINGE) IMPLANT
TRAP ETRAP POLY (MISCELLANEOUS) ×3 IMPLANT
VARIJECT INJECTOR VIN23 (MISCELLANEOUS)
WATER STERILE IRR 250ML POUR (IV SOLUTION) ×3 IMPLANT
WIRE CRE 18-20MM 8CM F G (MISCELLANEOUS) IMPLANT

## 2015-12-09 NOTE — Telephone Encounter (Signed)
Dr. Allen Norris contacted pt's wife and answered all questions.

## 2015-12-09 NOTE — Transfer of Care (Signed)
Immediate Anesthesia Transfer of Care Note  Patient: Zachary Buck  Procedure(s) Performed: Procedure(s) with comments: COLONOSCOPY WITH PROPOFOL (N/A) - DIABETIC-ORAL MEDS ESOPHAGOGASTRODUODENOSCOPY (EGD) WITH PROPOFOL (N/A) POLYPECTOMY (N/A)  Patient Location: PACU  Anesthesia Type: MAC  Level of Consciousness: awake, alert  and patient cooperative  Airway and Oxygen Therapy: Patient Spontanous Breathing and Patient connected to supplemental oxygen  Post-op Assessment: Post-op Vital signs reviewed, Patient's Cardiovascular Status Stable, Respiratory Function Stable, Patent Airway and No signs of Nausea or vomiting  Post-op Vital Signs: Reviewed and stable  Complications: No apparent anesthesia complications

## 2015-12-09 NOTE — Anesthesia Postprocedure Evaluation (Signed)
Anesthesia Post Note  Patient: Zachary Buck  Procedure(s) Performed: Procedure(s) (LRB): COLONOSCOPY WITH PROPOFOL (N/A) ESOPHAGOGASTRODUODENOSCOPY (EGD) WITH PROPOFOL (N/A) POLYPECTOMY (N/A)  Patient location during evaluation: PACU Anesthesia Type: MAC Level of consciousness: awake and alert Pain management: pain level controlled Vital Signs Assessment: post-procedure vital signs reviewed and stable Respiratory status: spontaneous breathing, nonlabored ventilation and respiratory function stable Cardiovascular status: stable and blood pressure returned to baseline Anesthetic complications: no    Trecia Rogers

## 2015-12-09 NOTE — Telephone Encounter (Signed)
Wife called to report that, he had colonoscopy this morning and Dr Allen Norris found a bleeding area in his colon which he fixed, does he still need to come in Monday for iron check? I advised her to keep appt Monday and explained reasoning regarding the fact that his iron is going to take time to rebuild even after the bleeding stops and we need to know where his iron levels are now. She is in agreement to keep appts for Monday and Tuesday

## 2015-12-09 NOTE — Telephone Encounter (Signed)
  Good plan.  We will see him on Monday/Tuesday.  I am glad they found the source of his bleeding.  M

## 2015-12-09 NOTE — Op Note (Signed)
[facility] Gastroenterology Patient Name: Zachary Buck Procedure Date: 12/09/2015 7:31 AM MRN: [id number] Account #: [id number] Date of Birth: 01/21/1941 Admit Type: Outpatient Age: 75 Room: [facility] OR ROOM 01 Gender: Male Note Status: Finalized Procedure:            Colonoscopy Indications:          Iron deficiency anemia Providers:            [name] MD, MD Medicines:            Propofol per Anesthesia Complications:        No immediate complications. Procedure:            Pre-Anesthesia Assessment:                       - Prior to the procedure, a History and Physical was                        performed, and patient medications and allergies were                        reviewed. The patient's tolerance of previous                        anesthesia was also reviewed. The risks and benefits of                        the procedure and the sedation options and risks were                        discussed with the patient. All questions were                        answered, and informed consent was obtained. Prior                        Anticoagulants: The patient has taken no previous                        anticoagulant or antiplatelet agents. ASA Grade                        Assessment: II - A patient with mild systemic disease.                        After reviewing the risks and benefits, the patient was                        deemed in satisfactory condition to undergo the                        procedure.                       After obtaining informed consent, the colonoscope was                        passed under direct vision. Throughout the procedure,                        the patient's blood pressure, pulse, and  oxygen                        saturations were monitored continuously. The Olympus CF                        H180AL colonoscope (S#: [id number]) was introduced through                        the anus and advanced to the the cecum,  identified by                        appendiceal orifice and ileocecal valve. The                        colonoscopy was performed without difficulty. The                        patient tolerated the procedure well. The quality of                        the bowel preparation was excellent. Findings:      The perianal and digital rectal examinations were normal.      A 4 mm polyp was found in the ascending colon. The polyp was sessile.       The polyp was removed with a hot snare. Resection and retrieval were       complete.      Two sessile polyps were found in the sigmoid colon. The polyps were 4 to       5 mm in size. These polyps were removed with a hot snare. Resection and       retrieval were complete.      Non-bleeding internal hemorrhoids were found during retroflexion. The       hemorrhoids were Grade II (internal hemorrhoids that prolapse but reduce       spontaneously). Impression:           - One 4 mm polyp in the ascending colon, removed with a                        hot snare. Resected and retrieved.                       - Two 4 to 5 mm polyps in the sigmoid colon, removed                        with a hot snare. Resected and retrieved.                       - Non-bleeding internal hemorrhoids. Recommendation:       - Discharge patient to home.                       - Resume previous diet.                       - Continue present medications.                       - Await pathology results. Procedure Code(s):    --- Professional ---  45385, Colonoscopy, flexible; with removal of tumor(s),                        polyp(s), or other lesion(s) by snare technique Diagnosis Code(s):    --- Professional ---                       D50.9, Iron deficiency anemia, unspecified                       D12.5, Benign neoplasm of sigmoid colon                       D12.2, Benign neoplasm of ascending colon CPT copyright 2016 American Medical Association. All rights  reserved. The codes documented in this report are preliminary and upon coder review may  be revised to meet current compliance requirements. [name] MD, MD 12/09/2015 8:57:21 AM This report has been signed electronically. Number of Addenda: 0 Note Initiated On: 12/09/2015 7:31 AM Scope Withdrawal Time: 0 hours 10 minutes 4 seconds  Total Procedure Duration: 0 hours 14 minutes 22 seconds       [facility]

## 2015-12-09 NOTE — Op Note (Signed)
[facility] Gastroenterology Patient Name: Zachary Buck Procedure Date: 12/09/2015 7:31 AM MRN: [id number] Account #: [id number] Date of Birth: 08/26/1940 Admit Type: Outpatient Age: 75 Room: [facility] OR ROOM 01 Gender: Male Note Status: Finalized Procedure:            Upper GI endoscopy Indications:          Iron deficiency anemia Providers:            [name] MD, MD Referring MD:         Einar Pheasant, MD (Referring MD) Medicines:            Propofol per Anesthesia Complications:        No immediate complications. Procedure:            Pre-Anesthesia Assessment:                       - Prior to the procedure, a History and Physical was                        performed, and patient medications and allergies were                        reviewed. The patient's tolerance of previous                        anesthesia was also reviewed. The risks and benefits of                        the procedure and the sedation options and risks were                        discussed with the patient. All questions were                        answered, and informed consent was obtained. Prior                        Anticoagulants: The patient has taken no previous                        anticoagulant or antiplatelet agents. ASA Grade                        Assessment: II - A patient with mild systemic disease.                        After reviewing the risks and benefits, the patient was                        deemed in satisfactory condition to undergo the                        procedure.                       After obtaining informed consent, the endoscope was                        passed under direct vision. Throughout the procedure,  the patient's blood pressure, pulse, and oxygen                        saturations were monitored continuously. The was                        introduced through the mouth, and advanced to the       second part of duodenum. The upper GI endoscopy was                        accomplished without difficulty. The patient tolerated                        the procedure well. Findings:      Grade II varices were found in the lower third of the esophagus.      A single 5 mm sessile polyp with no bleeding and no stigmata of recent       bleeding was found in the gastric antrum.      A single 3 mm angioectasia with bleeding was found in the second portion       of the duodenum. Coagulation for hemostasis using bipolar probe was       unsuccessful. For hemostasis, two hemostatic clips were successfully       placed (MR conditional). There was no bleeding at the end of the       procedure. Impression:           - Grade II esophageal varices.                       - A single gastric polyp.                       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                        Treatment not successful. Treated with bipolar cautery.                        Clips (MR conditional) were placed.                       - No specimens collected. Recommendation:       - Discharge patient to home.                       - Resume previous diet.                       - Continue present medications.                       - Perform a colonoscopy today. Procedure Code(s):    --- Professional ---                       [phone], Esophagogastroduodenoscopy, flexible, transoral;                        with control of bleeding, any method Diagnosis Code(s):    --- Professional ---  D50.9, Iron deficiency anemia, unspecified                       I85.00, Esophageal varices without bleeding                       K31.811, Angiodysplasia of stomach and duodenum with                        bleeding CPT copyright 2016 American Medical Association. All rights reserved. The codes documented in this report are preliminary and upon coder review may  be revised to meet current compliance  requirements. [name] MD, MD 12/09/2015 8:37:50 AM This report has been signed electronically. Number of Addenda: 0 Note Initiated On: 12/09/2015 7:31 AM      [facility]

## 2015-12-09 NOTE — Anesthesia Procedure Notes (Signed)
Procedure Name: MAC Date/Time: 12/09/2015 8:13 AM Performed by: Janna Arch Pre-anesthesia Checklist: Patient identified, Emergency Drugs available, Suction available and Patient being monitored Patient Re-evaluated:Patient Re-evaluated prior to inductionOxygen Delivery Method: Nasal cannula

## 2015-12-09 NOTE — H&P (Signed)
Zachary Lame, MD [facility] [address] [place], [place] [address] Phone: [phone] Fax : [phone]  Primary Care Physician:  Einar Pheasant, MD Primary Gastroenterologist:  Dr. Allen Norris  Pre-Procedure History & Physical: HPI:  Zachary Buck is a 75 y.o. male is here for an endoscopy and colonoscopy.   Past Medical History:  Diagnosis Date  . Anemia   . Atherosclerotic heart disease    s/p MI, s/p stent mid circumflex  . CAD (coronary artery disease)    Dr Nehemiah Massed, cardiologist  . Cirrhosis of liver ([place])   . Clavicle fracture   . Diabetes mellitus without complication ([place])    oral med  . Esophageal varices ([place])   . Hepatic encephalopathy ([place])   . Hypercholesterolemia   . Hypertension    controlled on meds  . Internal hemorrhoids   . Leg fracture   . Multiple myeloma ([place])   . Myocardial infarct   . Prostate enlargement   . Tubular adenoma of colon     Past Surgical History:  Procedure Laterality Date  . COLONOSCOPY WITH PROPOFOL N/A 03/04/2015   Procedure: COLONOSCOPY WITH PROPOFOL;  Surgeon: Jerene Bears, MD;  Location: [facility] ENDOSCOPY;  Service: Gastroenterology;  Laterality: N/A;  . CORONARY ANGIOPLASTY WITH STENT PLACEMENT     2 stents  . ESOPHAGOGASTRODUODENOSCOPY (EGD) WITH PROPOFOL N/A 03/04/2015   Procedure: ESOPHAGOGASTRODUODENOSCOPY (EGD) WITH PROPOFOL;  Surgeon: Jerene Bears, MD;  Location: [facility] ENDOSCOPY;  Service: Gastroenterology;  Laterality: N/A;  . TONSILLECTOMY      Prior to Admission medications   Medication Sig Start Date End Date Taking? Authorizing Provider  Alpha-Lipoic Acid (LIPOIC ACID PO) Take 200 mg by mouth daily.    Yes Historical Provider, MD  Cholecalciferol (VITAMIN D-3) 1000 UNITS CAPS Take by mouth.   Yes Historical Provider, MD  CHROMIUM PO Take 600 mg by mouth daily.    Yes Historical Provider, MD  Cinnamon 500 MG capsule Take 500 mg by mouth.   Yes Historical Provider, MD  Ginger, Zingiber officinalis, (GINGER  ROOT) 550 MG CAPS Take by mouth daily.   Yes Historical Provider, MD  glipiZIDE (GLUCOTROL) 5 MG tablet TAKE 1 TABLET BY MOUTH EVERY MORNING AND 2 TABLETS EVERY EVENING 10/18/15  Yes Einar Pheasant, MD  lisinopril (PRINIVIL,ZESTRIL) 20 MG tablet TAKE ONE TABLET BY MOUTH EVERY DAY/ AM 01/11/15  Yes Historical Provider, MD  metFORMIN (GLUCOPHAGE) 1000 MG tablet TAKE 1 TABLET(1000 MG) BY MOUTH TWICE DAILY WITH A MEAL 08/12/15  Yes Einar Pheasant, MD  nadolol (CORGARD) 20 MG tablet Take 1 tablet (20 mg total) by mouth daily. Patient taking differently: Take 15 mg by mouth daily. PM 03/04/15  Yes Jerene Bears, MD  Omega-3 Fatty Acids (FISH OIL) 1000 MG CAPS Take 4,000 mg by mouth daily.    Yes Historical Provider, MD  [place]   Yes Historical Provider, MD  OVER THE COUNTER MEDICATION 250 MG CITICOLINE   Yes Historical Provider, MD  OVER THE COUNTER MEDICATION    Yes Historical Provider, MD  OVER THE COUNTER MEDICATION    Yes Historical Provider, MD  OVER THE COUNTER MEDICATION    Yes Historical Provider, MD  [place]    Yes Historical Provider, MD  SBI/Protein Isolate (ENTERAGAM) 5 g PACK Take 1 packet by mouth daily. Patient taking differently: Take 1 packet by mouth as needed.  07/26/15  Yes Lequita Asal, MD  Theanine 100 MG CAPS Take by mouth daily.  Yes Historical Provider, MD  Turmeric POWD 4,000 mg by Does not apply route daily.   Yes Historical Provider, MD  Ascorbic Acid (VITAMIN C) 1000 MG tablet Take 1,000 mg by mouth daily.    Historical Provider, MD  B Complex Vitamins (VITAMIN B COMPLEX) TABS Take by mouth daily.    Historical Provider, MD  blood glucose meter kit and supplies KIT Dispense based on patient and insurance preference. Use up to four times daily as directed. (FOR ICD-9 250.00, 250.01). 03/16/14   Einar Pheasant, MD  blood glucose meter kit and supplies KIT Dispense based on patient and insurance preference. Use up to four  times daily as directed. E11.9 Please dispense Freestyle freedom meter and supplies. 09/09/15   Jayce G Cook, DO  Coenzyme Q10 100 MG capsule Take by mouth.    Historical Provider, MD  glucose blood (BAYER CONTOUR NEXT TEST) test strip Contour Next EZ test strips: check blood sugars twice a day. Dx: 250.00 05/07/13   Historical Provider, MD  glucose blood (ONE TOUCH ULTRA TEST) test strip Use as instructed 11/18/15   Einar Pheasant, MD  Lancets (ONETOUCH ULTRASOFT) lancets Use as instructed 03/16/14   Einar Pheasant, MD  Magnesium 500 MG CAPS Take 500 mg by mouth daily.    Historical Provider, MD  sildenafil (REVATIO) 20 MG tablet 3 to 5 tablets per day or as instructed 09/01/15   Historical Provider, MD  triamcinolone cream (KENALOG) 0.1 % Apply topically 2 (two) times daily. Please avoid face and genitalia area.  Do not use in the same spot for more than 7-10 days in a row. Patient taking differently: Apply topically daily. Please avoid face and genitalia area.  Do not use in the same spot for more than 7-10 days in a row. 09/28/15   Einar Pheasant, MD  vitamin B-12 (CYANOCOBALAMIN) 100 MCG tablet Take 200 mcg by mouth daily.    Historical Provider, MD    Allergies as of 11/19/2015 - Review Complete 11/19/2015  Allergen Reaction Noted  . Niaspan [niacin er] Other (See Comments) 03/19/2012    Family History  Problem Relation Age of Onset  . Aneurysm Father 83    of the heart  . Heart attack Father   . Diabetes Mother     Social History   Social History  . Marital status: Married    Spouse name: N/A  . Number of children: 2  . Years of education: N/A   Occupational History  . Estate manager/land agent   Social History Main Topics  . Smoking status: Never Smoker  . Smokeless tobacco: Never Used  . Alcohol use No  . Drug use: No  . Sexual activity: Not on file   Other Topics Concern  . Not on file   Social History Narrative  . No narrative on file    Review of Systems: See HPI,  otherwise negative ROS  Physical Exam: BP (!) 175/78   Pulse (!) 56   Temp 98.9 F (37.2 C) (Temporal)   Ht 5' 9"  (1.753 m)   Wt 176 lb (79.8 kg)   SpO2 97%   BMI 25.99 kg/m  General:   Alert,  pleasant and cooperative in NAD Head:  Normocephalic and atraumatic. Neck:  Supple; no masses or thyromegaly. Lungs:  Clear throughout to auscultation.    Heart:  Regular rate and rhythm. Abdomen:  Soft, nontender and nondistended. Normal bowel sounds, without guarding, and without rebound.   Neurologic:  Alert and  oriented x4;  grossly normal neurologically.  Impression/Plan: Zachary Buck is here for an endoscopy and colonoscopy to be performed for varcies and anemia.  Risks, benefits, limitations, and alternatives regarding  endoscopy and colonoscopy have been reviewed with the patient.  Questions have been answered.  All parties agreeable.   Zachary Lame, MD  12/09/2015, 7:56 AM

## 2015-12-09 NOTE — Telephone Encounter (Signed)
Patient's wife has called with questions concerning the EGD that was done today by Dr Allen Norris in reference to a future appointment that may need to be done for treatment of varices.   Another questions is can the patient go hunting with the concern of heavy pulling.

## 2015-12-10 ENCOUNTER — Encounter: Payer: Self-pay | Admitting: Gastroenterology

## 2015-12-10 ENCOUNTER — Other Ambulatory Visit: Payer: Self-pay

## 2015-12-13 ENCOUNTER — Encounter: Payer: Self-pay | Admitting: Gastroenterology

## 2015-12-13 ENCOUNTER — Other Ambulatory Visit: Payer: Self-pay | Admitting: *Deleted

## 2015-12-13 ENCOUNTER — Inpatient Hospital Stay: Payer: PPO

## 2015-12-13 DIAGNOSIS — C9000 Multiple myeloma not having achieved remission: Secondary | ICD-10-CM

## 2015-12-13 DIAGNOSIS — D509 Iron deficiency anemia, unspecified: Secondary | ICD-10-CM

## 2015-12-13 DIAGNOSIS — D500 Iron deficiency anemia secondary to blood loss (chronic): Secondary | ICD-10-CM

## 2015-12-13 LAB — CBC WITH DIFFERENTIAL/PLATELET
Basophils Absolute: 0.1 10*3/uL (ref 0–0.1)
Basophils Relative: 1 %
Eosinophils Absolute: 0.5 10*3/uL (ref 0–0.7)
Eosinophils Relative: 10 %
HCT: 28.3 % — ABNORMAL LOW (ref 40.0–52.0)
Hemoglobin: 9.6 g/dL — ABNORMAL LOW (ref 13.0–18.0)
Lymphocytes Relative: 23 %
Lymphs Abs: 1 10*3/uL (ref 1.0–3.6)
MCH: 29.8 pg (ref 26.0–34.0)
MCHC: 34 g/dL (ref 32.0–36.0)
MCV: 87.7 fL (ref 80.0–100.0)
Monocytes Absolute: 0.6 10*3/uL (ref 0.2–1.0)
Monocytes Relative: 13 %
Neutro Abs: 2.4 10*3/uL (ref 1.4–6.5)
Neutrophils Relative %: 53 %
Platelets: 95 10*3/uL — ABNORMAL LOW (ref 150–440)
RBC: 3.22 MIL/uL — ABNORMAL LOW (ref 4.40–5.90)
RDW: 16 % — ABNORMAL HIGH (ref 11.5–14.5)
WBC: 4.5 10*3/uL (ref 3.8–10.6)

## 2015-12-13 LAB — COMPREHENSIVE METABOLIC PANEL
ALT: 38 U/L (ref 17–63)
AST: 43 U/L — ABNORMAL HIGH (ref 15–41)
Albumin: 3 g/dL — ABNORMAL LOW (ref 3.5–5.0)
Alkaline Phosphatase: 69 U/L (ref 38–126)
Anion gap: 7 (ref 5–15)
BUN: 19 mg/dL (ref 6–20)
CO2: 23 mmol/L (ref 22–32)
Calcium: 9.2 mg/dL (ref 8.9–10.3)
Chloride: 107 mmol/L (ref 101–111)
Creatinine, Ser: 1.08 mg/dL (ref 0.61–1.24)
GFR calc Af Amer: >60 mL/min (ref >60)
GFR calc non Af Amer: >60 mL/min (ref >60)
Glucose, Bld: 208 mg/dL — ABNORMAL HIGH (ref 65–99)
Potassium: 4.4 mmol/L (ref 3.5–5.1)
Sodium: 137 mmol/L (ref 135–145)
Total Bilirubin: 1 mg/dL (ref 0.3–1.2)
Total Protein: 7.5 g/dL (ref 6.5–8.1)

## 2015-12-13 LAB — FERRITIN: Ferritin: 52 ng/mL (ref 24–336)

## 2015-12-14 ENCOUNTER — Ambulatory Visit: Payer: PPO | Admitting: Internal Medicine

## 2015-12-14 ENCOUNTER — Inpatient Hospital Stay: Payer: PPO

## 2015-12-14 ENCOUNTER — Inpatient Hospital Stay (HOSPITAL_BASED_OUTPATIENT_CLINIC_OR_DEPARTMENT_OTHER): Payer: PPO | Admitting: Hematology and Oncology

## 2015-12-14 ENCOUNTER — Other Ambulatory Visit: Payer: Self-pay | Admitting: *Deleted

## 2015-12-14 VITALS — BP 157/78 | HR 57 | Temp 96.3°F | Resp 18 | Wt 184.1 lb

## 2015-12-14 DIAGNOSIS — C9000 Multiple myeloma not having achieved remission: Secondary | ICD-10-CM

## 2015-12-14 DIAGNOSIS — D509 Iron deficiency anemia, unspecified: Secondary | ICD-10-CM | POA: Diagnosis not present

## 2015-12-14 DIAGNOSIS — Z77090 Contact with and (suspected) exposure to asbestos: Secondary | ICD-10-CM

## 2015-12-14 DIAGNOSIS — D500 Iron deficiency anemia secondary to blood loss (chronic): Secondary | ICD-10-CM

## 2015-12-14 DIAGNOSIS — J849 Interstitial pulmonary disease, unspecified: Secondary | ICD-10-CM

## 2015-12-14 DIAGNOSIS — D696 Thrombocytopenia, unspecified: Secondary | ICD-10-CM

## 2015-12-14 DIAGNOSIS — K7469 Other cirrhosis of liver: Secondary | ICD-10-CM

## 2015-12-14 DIAGNOSIS — K760 Fatty (change of) liver, not elsewhere classified: Secondary | ICD-10-CM

## 2015-12-14 DIAGNOSIS — Z79899 Other long term (current) drug therapy: Secondary | ICD-10-CM

## 2015-12-14 LAB — PROTEIN ELECTROPHORESIS, SERUM
A/G Ratio: 0.7 (ref 0.7–1.7)
Albumin ELP: 3 g/dL (ref 2.9–4.4)
Alpha-1-Globulin: 0.2 g/dL (ref 0.0–0.4)
Alpha-2-Globulin: 0.7 g/dL (ref 0.4–1.0)
Beta Globulin: 0.8 g/dL (ref 0.7–1.3)
Gamma Globulin: 2.4 g/dL — ABNORMAL HIGH (ref 0.4–1.8)
Globulin, Total: 4.1 g/dL — ABNORMAL HIGH (ref 2.2–3.9)
M-Spike, %: 2.2 g/dL — ABNORMAL HIGH
Total Protein ELP: 7.1 g/dL (ref 6.0–8.5)

## 2015-12-14 NOTE — Progress Notes (Signed)
[facility] day:  12/14/15  Chief Complaint: Zachary Buck is a 75 y.o. male  with stage II multiple myeloma and iron deficiency anemia who is seen for 1 month assessment.  HPI: The patient was last seen in the medical oncology clinic on 11/15/2015.  At that time, he felt fairly good.  Diet was poor.  He had anemia and guaiac positive stools.  Iron stores were low despite IV iron.  SPEP was stable at 2.3 gm/dL.  He was referred back to GI.  He began weekly Venofer x 3 (09/26, 10/03, and 11/30/2015).    He underwent EGD and colonoscopy on 12/09/2015 by Dr. [name].  He had grade II esophageal varices.  There was a single gastric polyp.  There was a single bleeding angioectasia in the duodenum  He was treated with bipolar cautery and clips.  Colonoscopy revealed one 4 mm polyp in the ascending colon and two 4-5 mm polyps in the sigmoid colon (hyperplastic polyps).    Labs on 12/13/2015 revealed a hematocrit of 28.3, hemoglobin 9.6, MCV 87.7, platelets 95,000, WBC 4500 with an ANC of 2400.  Creatinine was 1.09, calcium 9.2, protein 7.5, and  albumen 3.0.  Ferritin was 52.  SPEP is pending.  Symptomatically, he feels much better. He states that his black stools are now brown stools. He has a plan for a follow-up EGD.   Past Medical History:  Diagnosis Date  . Anemia   . Atherosclerotic heart disease    s/p MI, s/p stent mid circumflex  . CAD (coronary artery disease)    Dr Nehemiah Massed, cardiologist  . Cirrhosis of liver ([place])   . Clavicle fracture   . Diabetes mellitus without complication ([place])    oral med  . Esophageal varices ([place])   . Hepatic encephalopathy ([place])   . Hypercholesterolemia   . Hypertension    controlled on meds  . Internal hemorrhoids   . Leg fracture   . Multiple myeloma ([place])   . Myocardial infarct   . Prostate enlargement   . Tubular adenoma of colon     Past Surgical History:  Procedure Laterality  Date  . COLONOSCOPY WITH PROPOFOL N/A 03/04/2015   Procedure: COLONOSCOPY WITH PROPOFOL;  Surgeon: Jerene Bears, MD;  Location: [facility];  Service: Gastroenterology;  Laterality: N/A;  . COLONOSCOPY WITH PROPOFOL N/A 12/09/2015   Procedure: COLONOSCOPY WITH PROPOFOL;  Surgeon: [name], MD;  Location: [facility];  Service: Endoscopy;  Laterality: N/A;  DIABETIC-ORAL MEDS  . CORONARY ANGIOPLASTY WITH STENT PLACEMENT     2 stents  . ESOPHAGOGASTRODUODENOSCOPY (EGD) WITH PROPOFOL N/A 03/04/2015   Procedure: ESOPHAGOGASTRODUODENOSCOPY (EGD) WITH PROPOFOL;  Surgeon: Jerene Bears, MD;  Location: [facility];  Service: Gastroenterology;  Laterality: N/A;  . ESOPHAGOGASTRODUODENOSCOPY (EGD) WITH PROPOFOL N/A 12/09/2015   Procedure: ESOPHAGOGASTRODUODENOSCOPY (EGD) WITH PROPOFOL;  Surgeon: [name], MD;  Location: [facility];  Service: Endoscopy;  Laterality: N/A;  . POLYPECTOMY N/A 12/09/2015   Procedure: POLYPECTOMY;  Surgeon: [name], MD;  Location: [facility];  Service: Endoscopy;  Laterality: N/A;  . TONSILLECTOMY      Family History  Problem Relation Age of Onset  . Aneurysm Father 53    of the heart  . Heart attack Father   . Diabetes Mother     Social History:  reports that he has never smoked. He has never used smokeless tobacco. He reports that he does not drink alcohol  or use drugs.  Patient denies any exposure to radiation or toxins.   Allergies:  Allergies  Allergen Reactions  . Niaspan [Niacin Er] Other (See Comments)    Just does not want to take     Current Medications: Current Outpatient Prescriptions  Medication Sig Dispense Refill  . Alpha-Lipoic Acid (LIPOIC ACID PO) Take 200 mg by mouth daily.     . Ascorbic Acid (VITAMIN C) 1000 MG tablet Take 1,000 mg by mouth daily.    . B Complex Vitamins (VITAMIN B COMPLEX) TABS Take by mouth daily.    . blood glucose meter kit and supplies KIT Dispense based on patient and insurance  preference. Use up to four times daily as directed. (FOR ICD-9 250.00, 250.01). 1 each 0  . blood glucose meter kit and supplies KIT Dispense based on patient and insurance preference. Use up to four times daily as directed. E11.9 Please dispense Freestyle freedom meter and supplies. 1 each 0  . Cholecalciferol (VITAMIN D-3) 1000 UNITS CAPS Take by mouth.    . CHROMIUM PO Take 600 mg by mouth daily.     . Cinnamon 500 MG capsule Take 500 mg by mouth.    . Coenzyme Q10 100 MG capsule Take by mouth.    . Ginger, Zingiber officinalis, (GINGER ROOT) 550 MG CAPS Take by mouth daily.    Marland Kitchen glipiZIDE (GLUCOTROL) 5 MG tablet TAKE 1 TABLET BY MOUTH EVERY MORNING AND 2 TABLETS EVERY EVENING 270 tablet 2  . glucose blood (BAYER CONTOUR NEXT TEST) test strip Contour Next EZ test strips: check blood sugars twice a day. Dx: 250.00    . glucose blood (ONE TOUCH ULTRA TEST) test strip Use as instructed 100 each 12  . Lancets (ONETOUCH ULTRASOFT) lancets Use as instructed 100 each 12  . lisinopril (PRINIVIL,ZESTRIL) 20 MG tablet TAKE ONE TABLET BY MOUTH EVERY DAY/ AM    . Magnesium 500 MG CAPS Take 500 mg by mouth daily.    . metFORMIN (GLUCOPHAGE) 1000 MG tablet TAKE 1 TABLET(1000 MG) BY MOUTH TWICE DAILY WITH A MEAL 60 tablet 5  . nadolol (CORGARD) 20 MG tablet Take 1 tablet (20 mg total) by mouth daily. (Patient taking differently: Take 15 mg by mouth daily. PM) 30 tablet 11  . Omega-3 Fatty Acids (FISH OIL) 1000 MG CAPS Take 4,000 mg by mouth daily.     Marland Kitchen OVER THE COUNTER MEDICATION BUPLEURUM    . OVER THE COUNTER MEDICATION 250 MG CITICOLINE    . OVER THE COUNTER MEDICATION     . OVER THE COUNTER MEDICATION     . OVER THE COUNTER MEDICATION     . OVER THE COUNTER MEDICATION     . SBI/Protein Isolate (ENTERAGAM) 5 g PACK Take 1 packet by mouth daily. (Patient taking differently: Take 1 packet by mouth as needed. ) 30 each 3  . sildenafil (REVATIO) 20 MG tablet 3 to 5 tablets per day or as instructed    .  Theanine 100 MG CAPS Take by mouth daily.    Marland Kitchen triamcinolone cream (KENALOG) 0.1 % Apply topically 2 (two) times daily. Please avoid face and genitalia area.  Do not use in the same spot for more than 7-10 days in a row. (Patient taking differently: Apply topically daily. Please avoid face and genitalia area.  Do not use in the same spot for more than 7-10 days in a row.) 80 g 0  . Turmeric POWD 4,000 mg by Does not apply route  daily.    . vitamin B-12 (CYANOCOBALAMIN) 100 MCG tablet Take 200 mcg by mouth daily.     No current facility-administered medications for this visit.    Facility-Administered Medications Ordered in Other Visits  Medication Dose Route Frequency Provider Last Rate Last Dose  . alteplase (CATHFLO ACTIVASE) injection 2 mg  2 mg Intracatheter Once PRN Lequita Asal, MD      . heparin lock flush 100 unit/mL  500 Units Intracatheter Once PRN Lequita Asal, MD      . heparin lock flush 100 unit/mL  250 Units Intracatheter Once PRN Lequita Asal, MD      . sodium chloride 0.9 % injection 10 mL  10 mL Intracatheter PRN Lequita Asal, MD      . sodium chloride 0.9 % injection 3 mL  3 mL Intravenous Once PRN Lequita Asal, MD        Review of Systems:  GENERAL:  Feels much better.  No fevers or sweats.  Weight up 5 pounds. PERFORMANCE STATUS (ECOG):  1 HEENT:  No visual changes, runny nose, sore throat, mouth sores or tenderness. Lungs: No shortness of breath or cough.  No hemoptysis. Cardiac:  No chest pain, palpitations, orthopnea, or PND. GI:  No nausea, vomiting, diarrhea, constipation, melena or hematochezia.  Stool now brown. GU:  No urgency, frequency, dysuria, or hematuria. Musculoskeletal:  No back pain.  No joint pain.  No muscle tenderness. Extremities:  No pain or swelling. Skin:  No rashes or skin changes. Neuro:  Short term memory issues.  No headache, numbness or weakness, balance or coordination issues. Endocrine:  Diabetes.  No  thyroid issues, hot flashes or night sweats. Psych:  No mood changes, depression or anxiety. Pain:  No focal pain. Review of systems:  All other systems reviewed and found to be negative.  Physical Exam: BP (!) 157/78 (BP Location: Left Arm, Patient Position: Sitting)   Pulse (!) 57   Temp (!) 96.3 F (35.7 C) (Tympanic)   Resp 18   Wt 184 lb 1.4 oz (83.5 kg)   BMI 27.18 kg/m   GENERAL:  Well developed, well nourished, gentleman sitting comfortably in the exam room in no acute distress. MENTAL STATUS:  Alert and oriented to person, place and time. HEAD:  Pearline Cables hair.  Normocephalic, atraumatic, face symmetric, no Cushingoid features. EYES:  Glasses.  Blue eyes.  Pupils equal round and reactive to light and accomodation.  No conjunctivitis or scleral icterus. ENT:  Oropharynx clear without lesion.  Tongue normal. Mucous membranes moist.  RESPIRATORY:  Clear to auscultation without rales, wheezes or rhonchi. CARDIOVASCULAR:  Regular rate and rhythm without murmur, rub or gallop. ABDOMEN:  Soft, non-tender, with active bowel sounds, and no appreciable hepatosplenomegaly.  No masses. SKIN:  No rashes, ulcers or lesions. EXTREMITIES: No edema, no skin discoloration or tenderness.  No palpable cords. LYMPH NODES: No palpable cervical, supraclavicular, axillary or inguinal adenopathy  NEUROLOGICAL: Unremarkable. PSYCH:  Appropriate.    Appointment on 12/13/2015  Component Date Value Ref Range Status  . WBC 12/13/2015 4.5  3.8 - 10.6 K/uL Final  . RBC 12/13/2015 3.22* 4.40 - 5.90 MIL/uL Final  . Hemoglobin 12/13/2015 9.6* 13.0 - 18.0 g/dL Final  . HCT 12/13/2015 28.3* 40.0 - 52.0 % Final  . MCV 12/13/2015 87.7  80.0 - 100.0 fL Final  . MCH 12/13/2015 29.8  26.0 - 34.0 pg Final  . MCHC 12/13/2015 34.0  32.0 - 36.0 g/dL Final  .  RDW 12/13/2015 16.0* 11.5 - 14.5 % Final  . Platelets 12/13/2015 95* 150 - 440 K/uL Final  . Neutrophils Relative % 12/13/2015 53  % Final  . Neutro Abs  12/13/2015 2.4  1.4 - 6.5 K/uL Final  . Lymphocytes Relative 12/13/2015 23  % Final  . Lymphs Abs 12/13/2015 1.0  1.0 - 3.6 K/uL Final  . Monocytes Relative 12/13/2015 13  % Final  . Monocytes Absolute 12/13/2015 0.6  0.2 - 1.0 K/uL Final  . Eosinophils Relative 12/13/2015 10  % Final  . Eosinophils Absolute 12/13/2015 0.5  0 - 0.7 K/uL Final  . Basophils Relative 12/13/2015 1  % Final  . Basophils Absolute 12/13/2015 0.1  0 - 0.1 K/uL Final  . Sodium 12/13/2015 137  135 - 145 mmol/L Final  . Potassium 12/13/2015 4.4  3.5 - 5.1 mmol/L Final  . Chloride 12/13/2015 107  101 - 111 mmol/L Final  . CO2 12/13/2015 23  22 - 32 mmol/L Final  . Glucose, Bld 12/13/2015 208* 65 - 99 mg/dL Final  . BUN 12/13/2015 19  6 - 20 mg/dL Final  . Creatinine, Ser 12/13/2015 1.08  0.61 - 1.24 mg/dL Final  . Calcium 12/13/2015 9.2  8.9 - 10.3 mg/dL Final  . Total Protein 12/13/2015 7.5  6.5 - 8.1 g/dL Final  . Albumin 12/13/2015 3.0* 3.5 - 5.0 g/dL Final  . AST 12/13/2015 43* 15 - 41 U/L Final  . ALT 12/13/2015 38  17 - 63 U/L Final  . Alkaline Phosphatase 12/13/2015 69  38 - 126 U/L Final  . Total Bilirubin 12/13/2015 1.0  0.3 - 1.2 mg/dL Final  . GFR calc non Af Amer 12/13/2015 >60  >60 mL/min Final  . GFR calc Af Amer 12/13/2015 >60  >60 mL/min Final   Comment: (NOTE) The eGFR has been calculated using the CKD EPI equation. This calculation has not been validated in all clinical situations. eGFR's persistently <60 mL/min signify possible Chronic Kidney Disease.   . Anion gap 12/13/2015 7  5 - 15 Final  . Ferritin 12/13/2015 52  24 - 336 ng/mL Final    Assessment:  REMER COUSE is a 75 y.o. male with iron deficiency anemia and stage II multiple myeloma. 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 Serum immunoglobulins noted an IgG of 2930 (high). 24-hour urine revealed a 8.8% monoclonal protein (10.6  mg/24 hours).  Albumen was 3.3 on 08/26/2014. He had pneumonia in 05/2014.   Bone survey on 10/20/2014 revealed no lytic lesions. Bone survey on 09/16/2015 revealed no focal lytic lesion or acute bony abnormality.   SPEP has been stable: 2.3 g/dL on 10/09/2014, 01/18/2015, 03/22/2015, 06/18/2015, 09/13/2015, and 11/08/2015.  SPEP was 2.2 on 12/13/2015.  Lambda free light chains were 155.3 (ratio 0.15) on 01/18/2015, 174.59 (ratio of 0.16) on 06/18/2015, 182.9 (ratio of 0.13) on 09/13/2015, and 205 (ratio 0.11) on 11/08/2015.  Bone marrow biopsy on 11/10/2014 revealed a 10% monoclonal plasma cell infiltrate. Marrow was hypercellular for age (40-50%) with mixed maturing hematopoiesis, relative erythroid hyperplasia and mild nonspecific dyserythropoiesis. There was patchy mild focally moderate increase in reticulin. There were no significant iron stores. Myeloma FISH panel revealed translocation (11;14) which results in fusion of CCND1 (BCL1) at 11q13 with the immunoglobulin heavy chain gene (IgH) at 14q32 (a favorable prognostic feature).   Beta2 microglobulin was 4.2 on 11/24/2014, 3.8 on 06/18/2015, and 4.9 on 10/18/2015.  PET scan on  12/07/2014 revealed no hypermetabolic foci within the marrow space or nodal stations to suggest active multiple myeloma. There was hypermetabolism within the right lower lobe, corresponding to a similar dependent density. Morphology favored scarring.There was cirrhosis and portal hypertension. There was gastric body hypermetabolism favoring physiologic. Spleen was 15.3 cm (volume 1065 cc).  There was pulmonary artery enlargement suggesting pulmonary artery hypertension. 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  He has a history of asbestos exposure in the [employer].  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  Abdominal CT scan on 03/16/2015 revealed splenomegaly (14.5 cm), cirrhosis, and stigmata of portal hypertension with  esophageal and gastric varices.  There were no liver lesions.  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  There were no pleural effusions.  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  There was no frank honeycombing. This finding accounted for the medial right lower lobe hypermetabolism on the 12/07/2014 PET-CT.  There was cirrhosis, splenomegaly, and prominent gastroesophageal varices.  He has had mild anemia since 10/2013 and mild thrombocytopenia since 05/2014. Colonoscopy in 10/2013 revealed polyps. EGD revealed gastritis. Capsule enteroscopy on 11/27/2014 revealed 1-2 gastric polyps and one colon polyp.  There were no findings to explain iron deficiency anemia.  His diet was good. Labs confirmed iron deficiency (ferritin 11.8 on 05/2014 and 13 on 10/09/2014). B12 was low normal (MMA normal) thus ruling out B12 deficiency.  Colonoscopy on 03/04/2015 revealed a sessile polyp was seen in the transverse colon (tubular adenoma). EGD on 03/04/2015 revealed medium-sized esophageal varices in the mid and distal esophagus without bleeding. There was a sessile polyp in the gastric antrum (hyperplastic polyp).  There were 3 small angiodysplastic lesions in the second part of the duodenum.  It was recommended that he continue Protonix.  He is on nadolol.  He was told that the etiology of his cirrhosis was fatty liver.  He has had a capsule study.  He stopped his Protonix secondary to pain in his stomach. He stopped his pancreatic enzymes. He notes diarrhea for a long time (6 months to 2 years).  Diarrhea has improved on Enterogam (began 06/22/2015).  He has not had diarrhea in awhile.  EGD on 12/09/2015 revealed grade II esophageal varices.  There was a single  gastric polyp.  There was a single bleeding angioectasia in the duodenum.  Colonoscopy on 12/09/2015 revealed one 4 mm polyp in the ascending colon and two 4-5 mm polyps in the sigmoid colon (hyperplastic polyps).    He has recurrent iron deficiency anemia.  He received 200 mg Venofer weekly 3 (03/26/2015 - 04/14/2015), weekly x 3 (06/25/2015 - 07/09/2015), weekly x 3 (09/17/2015 - 10/01/2015) and weekly x 3 (11/16/2015 - 11/30/2015).  Ferritin was 23 on 06/18/2015, 121 on 07/26/2015, 17 on 09/13/2015, 18 on 11/15/2015, and 52 on 12/13/2015.  Symptomatically, he feels good.  Stool color has improved (black to brown).  SPEP is stable.   Plan: 1.  Review labs from 12/10/2015.  Discuss improvement after endoscopy and cautery/clip and IV iron. 2.  Discuss stability in monoclonal protein.  Continue to monitor closely. 3.  RTC in 2 weeks for labs (CBC with diff, ferritin, retic) 4.  RTC in 4 weeks for MD assess, labs (CBC with diff, CMP, SPEP, FLCA, ferritin)   Lequita Asal, MD  12/14/2015, 9:50 AM

## 2015-12-14 NOTE — Progress Notes (Signed)
BP slightly elevated.  Patient states he has not had his BP meds in 2 days. Patient wants to know if it is okay to get flu shot.  Will see PCP next week and get it then if okay to.

## 2015-12-15 ENCOUNTER — Encounter: Payer: Self-pay | Admitting: Gastroenterology

## 2015-12-16 ENCOUNTER — Telehealth: Payer: Self-pay | Admitting: *Deleted

## 2015-12-16 DIAGNOSIS — E7800 Pure hypercholesterolemia, unspecified: Secondary | ICD-10-CM

## 2015-12-16 DIAGNOSIS — E119 Type 2 diabetes mellitus without complications: Secondary | ICD-10-CM

## 2015-12-16 NOTE — Telephone Encounter (Signed)
I have placed the orders for the labs that I need.  The cancer center has the labs they do.

## 2015-12-16 NOTE — Telephone Encounter (Signed)
Please advise 

## 2015-12-16 NOTE — Telephone Encounter (Signed)
FYI Pt has a lab appt in the morning, he requested that any and all labs needed be added so that he would not have to return for another lab appt.

## 2015-12-17 ENCOUNTER — Other Ambulatory Visit (INDEPENDENT_AMBULATORY_CARE_PROVIDER_SITE_OTHER): Payer: PPO

## 2015-12-17 DIAGNOSIS — E7800 Pure hypercholesterolemia, unspecified: Secondary | ICD-10-CM | POA: Diagnosis not present

## 2015-12-17 DIAGNOSIS — I10 Essential (primary) hypertension: Secondary | ICD-10-CM

## 2015-12-17 DIAGNOSIS — E871 Hypo-osmolality and hyponatremia: Secondary | ICD-10-CM

## 2015-12-17 DIAGNOSIS — E119 Type 2 diabetes mellitus without complications: Secondary | ICD-10-CM | POA: Diagnosis not present

## 2015-12-17 DIAGNOSIS — R7989 Other specified abnormal findings of blood chemistry: Secondary | ICD-10-CM | POA: Diagnosis not present

## 2015-12-17 LAB — LIPID PANEL
Cholesterol: 154 mg/dL (ref 0–200)
HDL: 25.8 mg/dL — AB (ref >39.00)
NONHDL: 127.97
TRIGLYCERIDES: 266 mg/dL — AB (ref 0.0–149.0)
Total CHOL/HDL Ratio: 6
VLDL: 53.2 mg/dL — AB (ref 0.0–40.0)

## 2015-12-17 LAB — HEPATIC FUNCTION PANEL
ALBUMIN: 3.2 g/dL — AB (ref 3.5–5.2)
ALT: 27 U/L (ref 0–53)
AST: 26 U/L (ref 0–37)
Alkaline Phosphatase: 69 U/L (ref 39–117)
Bilirubin, Direct: 0.1 mg/dL (ref 0.0–0.3)
TOTAL PROTEIN: 7.3 g/dL (ref 6.0–8.3)
Total Bilirubin: 0.7 mg/dL (ref 0.2–1.2)

## 2015-12-17 LAB — BASIC METABOLIC PANEL
BUN: 18 mg/dL (ref 6–23)
CHLORIDE: 107 meq/L (ref 96–112)
CO2: 26 meq/L (ref 19–32)
Calcium: 9.5 mg/dL (ref 8.4–10.5)
Creatinine, Ser: 1.15 mg/dL (ref 0.40–1.50)
GFR: 65.74 mL/min (ref >60.00)
GLUCOSE: 196 mg/dL — AB (ref 70–99)
POTASSIUM: 4.8 meq/L (ref 3.5–5.1)
SODIUM: 139 meq/L (ref 135–145)

## 2015-12-17 LAB — LDL CHOLESTEROL, DIRECT: LDL DIRECT: 56 mg/dL

## 2015-12-17 LAB — HEMOGLOBIN A1C: HEMOGLOBIN A1C: 6.8 % — AB (ref 4.6–6.5)

## 2015-12-17 NOTE — Telephone Encounter (Signed)
Pt called and notified of previous note

## 2015-12-17 NOTE — Telephone Encounter (Signed)
Left message to let patient know labs have been placed. If any questions instructed patient to return call back.

## 2015-12-20 ENCOUNTER — Encounter: Payer: Self-pay | Admitting: Gastroenterology

## 2015-12-20 ENCOUNTER — Ambulatory Visit (INDEPENDENT_AMBULATORY_CARE_PROVIDER_SITE_OTHER): Payer: PPO | Admitting: Gastroenterology

## 2015-12-20 ENCOUNTER — Other Ambulatory Visit: Payer: Self-pay

## 2015-12-20 VITALS — BP 144/65 | HR 54 | Temp 97.7°F | Ht 69.0 in | Wt 178.0 lb

## 2015-12-20 DIAGNOSIS — K921 Melena: Secondary | ICD-10-CM

## 2015-12-20 LAB — TSH: TSH: 6.49 u[IU]/mL — ABNORMAL HIGH (ref 0.35–4.50)

## 2015-12-20 NOTE — Patient Instructions (Signed)
You have been scheduled for an EGD at [facility] tomorrow, 12/21/15. Please arrive at 9:15am and check in at the [facility] entrance. You cannot have anything to eat or drink after midnight tonight. Please contact our office before 5:00pm today with any questions.

## 2015-12-20 NOTE — Progress Notes (Signed)
Primary Care Physician: Einar Pheasant, MD  Primary Gastroenterologist:  Dr. [name]  Chief Complaint  Patient presents with  . Melena  . Diarrhea  . Abdominal Pain    HPI: Zachary Buck is a 75 y.o. male here for follow-up after having an upper endoscopy with clipping of a bleeding site during his upper endoscopy. Patient reports that his stools had turned back to normal for a few days after the procedure but then started to turn black again. The patient also states that he had one day of severe weakness with some confusion but that also resolved. The patient denies any abdominal pain nausea or vomiting. The patient did have esophageal varices seen at the time of his last endoscopy but they were not banded because of his eating site in the duodenum.  Current Outpatient Prescriptions  Medication Sig Dispense Refill  . blood glucose meter kit and supplies KIT Dispense based on patient and insurance preference. Use up to four times daily as directed. (FOR ICD-9 250.00, 250.01). 1 each 0  . blood glucose meter kit and supplies KIT Dispense based on patient and insurance preference. Use up to four times daily as directed. E11.9 Please dispense Freestyle freedom meter and supplies. 1 each 0  . Ginger, Zingiber officinalis, (GINGER ROOT) 550 MG CAPS Take by mouth daily.    Marland Kitchen glipiZIDE (GLUCOTROL) 5 MG tablet TAKE 1 TABLET BY MOUTH EVERY MORNING AND 2 TABLETS EVERY EVENING 270 tablet 2  . glucose blood (BAYER CONTOUR NEXT TEST) test strip Contour Next EZ test strips: check blood sugars twice a day. Dx: 250.00    . glucose blood (ONE TOUCH ULTRA TEST) test strip Use as instructed 100 each 12  . Lancets (ONETOUCH ULTRASOFT) lancets Use as instructed 100 each 12  . lisinopril (PRINIVIL,ZESTRIL) 20 MG tablet TAKE ONE TABLET BY MOUTH EVERY DAY/ AM    . Magnesium 500 MG CAPS Take 500 mg by mouth daily.    . metFORMIN (GLUCOPHAGE) 1000 MG tablet TAKE 1 TABLET(1000 MG) BY MOUTH TWICE  DAILY WITH A MEAL 60 tablet 5  . nadolol (CORGARD) 20 MG tablet Take 1 tablet (20 mg total) by mouth daily. (Patient taking differently: Take 15 mg by mouth daily. PM) 30 tablet 11  . Omega-3 Fatty Acids (FISH OIL) 1000 MG CAPS Take 4,000 mg by mouth daily.     . sildenafil (REVATIO) 20 MG tablet 3 to 5 tablets per day or as instructed    . triamcinolone cream (KENALOG) 0.1 % Apply topically 2 (two) times daily. Please avoid face and genitalia area.  Do not use in the same spot for more than 7-10 days in a row. (Patient taking differently: Apply topically daily. Please avoid face and genitalia area.  Do not use in the same spot for more than 7-10 days in a row.) 80 g 0  . Alpha-Lipoic Acid (LIPOIC ACID PO) Take 200 mg by mouth daily.     . Ascorbic Acid (VITAMIN C) 1000 MG tablet Take 1,000 mg by mouth daily.    . B Complex Vitamins (VITAMIN B COMPLEX) TABS Take by mouth daily.    . Cholecalciferol (VITAMIN D-3) 1000 UNITS CAPS Take by mouth.    . CHROMIUM PO Take 600 mg by mouth daily.     . Cinnamon 500 MG capsule Take 500 mg by mouth.    . Coenzyme Q10 100 MG capsule Take by mouth.    Marland Kitchen OVER THE COUNTER MEDICATION BUPLEURUM    .  OVER THE COUNTER MEDICATION 250 MG CITICOLINE    . OVER THE COUNTER MEDICATION     . OVER THE COUNTER MEDICATION     . OVER THE COUNTER MEDICATION     . OVER THE COUNTER MEDICATION     . SBI/Protein Isolate (ENTERAGAM) 5 g PACK Take 1 packet by mouth daily. (Patient not taking: Reported on 12/20/2015) 30 each 3  . Theanine 100 MG CAPS Take by mouth daily.    . Turmeric POWD 4,000 mg by Does not apply route daily.    . vitamin B-12 (CYANOCOBALAMIN) 100 MCG tablet Take 200 mcg by mouth daily.     No current facility-administered medications for this visit.    Facility-Administered Medications Ordered in Other Visits  Medication Dose Route Frequency Provider Last Rate Last Dose  . alteplase (CATHFLO ACTIVASE) injection 2 mg  2 mg Intracatheter Once PRN Lequita Asal, MD      . heparin lock flush 100 unit/mL  500 Units Intracatheter Once PRN Lequita Asal, MD      . heparin lock flush 100 unit/mL  250 Units Intracatheter Once PRN Lequita Asal, MD      . sodium chloride 0.9 % injection 10 mL  10 mL Intracatheter PRN Lequita Asal, MD      . sodium chloride 0.9 % injection 3 mL  3 mL Intravenous Once PRN Lequita Asal, MD        Allergies as of 12/20/2015 - Review Complete 12/20/2015  Allergen Reaction Noted  . Niaspan [niacin er] Other (See Comments) 03/19/2012    ROS:  General: Negative for anorexia, weight loss, fever, chills, fatigue, weakness. ENT: Negative for hoarseness, difficulty swallowing , nasal congestion. CV: Negative for chest pain, angina, palpitations, dyspnea on exertion, peripheral edema.  Respiratory: Negative for dyspnea at rest, dyspnea on exertion, cough, sputum, wheezing.  GI: See history of present illness. GU:  Negative for dysuria, hematuria, urinary incontinence, urinary frequency, nocturnal urination.  Endo: Negative for unusual weight change.    Physical Examination:   BP (!) 144/65   Pulse (!) 54   Temp 97.7 F (36.5 C) (Oral)   Ht _0  (1.753 m)   Wt 178 lb (80.7 kg)   BMI 26.29 kg/m   General: Well-nourished, well-developed in no acute distress.  Eyes: No icterus. Conjunctivae pink. Neuro: Alert and oriented x 3.  Grossly intact. Skin: Warm and dry, no jaundice.   Psych: Alert and cooperative, normal mood and affect.  Labs:    Imaging Studies: No results found.  Assessment and Plan:   Zachary Buck is a 75 y.o. y/o male who has had continued melena the last few days. The patient will have an EGD tomorrow at the hospital. The patient will follow up at that time.   Note: This dictation was prepared with Dragon dictation along with smaller phrase technology. Any transcriptional errors that result from this process are unintentional.

## 2015-12-21 ENCOUNTER — Ambulatory Visit: Payer: PPO | Admitting: Anesthesiology

## 2015-12-21 ENCOUNTER — Encounter
Admission: RE | Disposition: A | Discharge status: Home / Self Care | Payer: Self-pay | Source: Ambulatory Visit | Attending: Gastroenterology

## 2015-12-21 ENCOUNTER — Other Ambulatory Visit: Payer: Self-pay | Admitting: Internal Medicine

## 2015-12-21 ENCOUNTER — Ambulatory Visit
Admission: RE | Admit: 2015-12-21 | Discharge: 2015-12-21 | Disposition: A | Discharge status: Home / Self Care | Payer: PPO | Source: Ambulatory Visit | Attending: Gastroenterology | Admitting: Gastroenterology

## 2015-12-21 ENCOUNTER — Encounter: Payer: Self-pay | Admitting: *Deleted

## 2015-12-21 DIAGNOSIS — K921 Melena: Secondary | ICD-10-CM | POA: Diagnosis not present

## 2015-12-21 DIAGNOSIS — Z79899 Other long term (current) drug therapy: Secondary | ICD-10-CM | POA: Insufficient documentation

## 2015-12-21 DIAGNOSIS — K7460 Unspecified cirrhosis of liver: Secondary | ICD-10-CM | POA: Diagnosis not present

## 2015-12-21 DIAGNOSIS — I8510 Secondary esophageal varices without bleeding: Secondary | ICD-10-CM | POA: Insufficient documentation

## 2015-12-21 DIAGNOSIS — N400 Enlarged prostate without lower urinary tract symptoms: Secondary | ICD-10-CM | POA: Insufficient documentation

## 2015-12-21 DIAGNOSIS — K317 Polyp of stomach and duodenum: Secondary | ICD-10-CM | POA: Insufficient documentation

## 2015-12-21 DIAGNOSIS — Z7984 Long term (current) use of oral hypoglycemic drugs: Secondary | ICD-10-CM | POA: Insufficient documentation

## 2015-12-21 DIAGNOSIS — I2510 Atherosclerotic heart disease of native coronary artery without angina pectoris: Secondary | ICD-10-CM | POA: Insufficient documentation

## 2015-12-21 DIAGNOSIS — Z888 Allergy status to other drugs, medicaments and biological substances status: Secondary | ICD-10-CM | POA: Insufficient documentation

## 2015-12-21 DIAGNOSIS — I8500 Esophageal varices without bleeding: Secondary | ICD-10-CM | POA: Diagnosis not present

## 2015-12-21 DIAGNOSIS — I252 Old myocardial infarction: Secondary | ICD-10-CM | POA: Diagnosis not present

## 2015-12-21 DIAGNOSIS — Z955 Presence of coronary angioplasty implant and graft: Secondary | ICD-10-CM | POA: Insufficient documentation

## 2015-12-21 DIAGNOSIS — D649 Anemia, unspecified: Secondary | ICD-10-CM | POA: Insufficient documentation

## 2015-12-21 DIAGNOSIS — E119 Type 2 diabetes mellitus without complications: Secondary | ICD-10-CM | POA: Diagnosis not present

## 2015-12-21 DIAGNOSIS — K648 Other hemorrhoids: Secondary | ICD-10-CM | POA: Insufficient documentation

## 2015-12-21 DIAGNOSIS — Z833 Family history of diabetes mellitus: Secondary | ICD-10-CM | POA: Insufficient documentation

## 2015-12-21 DIAGNOSIS — R1013 Epigastric pain: Secondary | ICD-10-CM | POA: Diagnosis not present

## 2015-12-21 DIAGNOSIS — I10 Essential (primary) hypertension: Secondary | ICD-10-CM | POA: Insufficient documentation

## 2015-12-21 DIAGNOSIS — R112 Nausea with vomiting, unspecified: Secondary | ICD-10-CM | POA: Diagnosis not present

## 2015-12-21 DIAGNOSIS — Z86010 Personal history of colonic polyps: Secondary | ICD-10-CM | POA: Insufficient documentation

## 2015-12-21 DIAGNOSIS — E7800 Pure hypercholesterolemia, unspecified: Secondary | ICD-10-CM | POA: Insufficient documentation

## 2015-12-21 DIAGNOSIS — Z87442 Personal history of urinary calculi: Secondary | ICD-10-CM | POA: Diagnosis not present

## 2015-12-21 DIAGNOSIS — Z8249 Family history of ischemic heart disease and other diseases of the circulatory system: Secondary | ICD-10-CM | POA: Insufficient documentation

## 2015-12-21 DIAGNOSIS — K7290 Hepatic failure, unspecified without coma: Secondary | ICD-10-CM | POA: Diagnosis not present

## 2015-12-21 DIAGNOSIS — Z85820 Personal history of malignant melanoma of skin: Secondary | ICD-10-CM | POA: Diagnosis not present

## 2015-12-21 DIAGNOSIS — R7989 Other specified abnormal findings of blood chemistry: Secondary | ICD-10-CM

## 2015-12-21 HISTORY — PX: ESOPHAGOGASTRODUODENOSCOPY (EGD) WITH PROPOFOL: SHX5813

## 2015-12-21 SURGERY — ESOPHAGOGASTRODUODENOSCOPY (EGD) WITH PROPOFOL
Anesthesia: General

## 2015-12-21 MED ORDER — SODIUM CHLORIDE 0.9 % IV SOLN
INTRAVENOUS | Status: DC
  Administered 2015-12-21: 10:00:00 via INTRAVENOUS

## 2015-12-21 MED ORDER — LIDOCAINE 2% (20 MG/ML) 5 ML SYRINGE
INTRAMUSCULAR | Status: DC | PRN
  Administered 2015-12-21: 40 mg via INTRAVENOUS

## 2015-12-21 MED ORDER — PROPOFOL 10 MG/ML IV BOLUS
INTRAVENOUS | Status: DC | PRN
Start: 2015-12-21 — End: 2015-12-21
  Administered 2015-12-21: 100 mg via INTRAVENOUS

## 2015-12-21 MED ORDER — SODIUM CHLORIDE 0.9 % IV SOLN
INTRAVENOUS | Status: DC
Start: 2015-12-21 — End: 2015-12-21
  Administered 2015-12-21: 10:00:00 via INTRAVENOUS

## 2015-12-21 MED ORDER — FENTANYL CITRATE (PF) 100 MCG/2ML IJ SOLN
INTRAMUSCULAR | Status: DC | PRN
  Administered 2015-12-21: 50 ug via INTRAVENOUS

## 2015-12-21 MED ORDER — MIDAZOLAM HCL 5 MG/5ML IJ SOLN
INTRAMUSCULAR | Status: DC | PRN
  Administered 2015-12-21: 1 mg via INTRAVENOUS

## 2015-12-21 MED ORDER — PROPOFOL 500 MG/50ML IV EMUL
INTRAVENOUS | Status: DC | PRN
  Administered 2015-12-21: 140 ug/kg/min via INTRAVENOUS

## 2015-12-21 NOTE — Anesthesia Postprocedure Evaluation (Signed)
Anesthesia Post Note  Patient: Zachary Buck  Procedure(s) Performed: Procedure(s) (LRB): ESOPHAGOGASTRODUODENOSCOPY (EGD) WITH PROPOFOL (N/A)  Patient location during evaluation: Endoscopy Anesthesia Type: General Level of consciousness: awake and alert and oriented Pain management: pain level controlled Vital Signs Assessment: post-procedure vital signs reviewed and stable Respiratory status: spontaneous breathing, nonlabored ventilation and respiratory function stable Cardiovascular status: blood pressure returned to baseline and stable Postop Assessment: no signs of nausea or vomiting Anesthetic complications: no    Last Vitals:  Vitals:   12/21/15 0915 12/21/15 1020  BP: (!) 154/62 (!) 182/78  Pulse: 69 70  Resp: 18 (!) 23  Temp: 36.2 C (!) 35.7 C    Last Pain:  Vitals:   12/21/15 0915  TempSrc: Tympanic                 Rawley Harju

## 2015-12-21 NOTE — Transfer of Care (Signed)
Immediate Anesthesia Transfer of Care Note  Patient: Zachary Buck  Procedure(s) Performed: Procedure(s): ESOPHAGOGASTRODUODENOSCOPY (EGD) WITH PROPOFOL (N/A)  Patient Location: PACU and Endoscopy Unit  Anesthesia Type:General  Level of Consciousness: sedated  Airway & Oxygen Therapy: Patient Spontanous Breathing and Patient connected to nasal cannula oxygen  Post-op Assessment: Report given to RN and Post -op Vital signs reviewed and stable  Post vital signs: Reviewed and stable  Last Vitals:  Vitals:   12/21/15 0915  BP: (!) 154/62  Pulse: 69  Resp: 18  Temp: 36.2 C    Last Pain:  Vitals:   12/21/15 0915  TempSrc: Tympanic         Complications: No apparent anesthesia complications

## 2015-12-21 NOTE — Anesthesia Preprocedure Evaluation (Signed)
Anesthesia Evaluation  Patient identified by MRN, date of birth, ID band Patient awake    Reviewed: Allergy & Precautions, NPO status , Patient's Chart, lab work & pertinent test results, reviewed documented beta blocker date and time   History of Anesthesia Complications Negative for: history of anesthetic complications  Airway Mallampati: II  TM Distance: >3 FB Neck ROM: Full    Dental no notable dental hx.    Pulmonary neg pulmonary ROS, neg sleep apnea, neg COPD,    breath sounds clear to auscultation- rhonchi (-) wheezing      Cardiovascular Exercise Tolerance: Good hypertension, Pt. on medications and Pt. on home beta blockers + CAD, + Past MI and + Cardiac Stents   Rhythm:Regular Rate:Normal - Systolic murmurs and - Diastolic murmurs Stress echo 09/18/14: normal, no evidence of ischemia, EF 55%   Neuro/Psych negative psych ROS   GI/Hepatic negative GI ROS, Neg liver ROS,   Endo/Other  diabetes, Type 2, Oral Hypoglycemic Agents  Renal/GU Renal disease: hx of nephrolithiasis.     Musculoskeletal negative musculoskeletal ROS (+)   Abdominal (+) - obese,   Peds  Hematology  (+) anemia ,   Anesthesia Other Findings Past Medical History: No date: Anemia No date: Atherosclerotic heart disease     Comment: s/p MI, s/p stent mid circumflex No date: CAD (coronary artery disease)     Comment: Dr Kowalski, cardiologist No date: Cirrhosis of liver (HCC) No date: Clavicle fracture No date: Diabetes mellitus without complication (HCC)     Comment: oral med No date: Esophageal varices (HCC) No date: Hepatic encephalopathy (HCC) No date: Hypercholesterolemia No date: Hypertension     Comment: controlled on meds No date: Internal hemorrhoids No date: Leg fracture No date: Multiple myeloma (HCC) No date: Myocardial infarct No date: Prostate enlargement No date: Tubular adenoma of colon    Reproductive/Obstetrics                             Anesthesia Physical Anesthesia Plan  ASA: III  Anesthesia Plan: General   Post-op Pain Management:    Induction: Intravenous  Airway Management Planned: Natural Airway  Additional Equipment:   Intra-op Plan:   Post-operative Plan:   Informed Consent: I have reviewed the patients History and Physical, chart, labs and discussed the procedure including the risks, benefits and alternatives for the proposed anesthesia with the patient or authorized representative who has indicated his/her understanding and acceptance.   Dental advisory given  Plan Discussed with: CRNA and Anesthesiologist  Anesthesia Plan Comments:         Anesthesia Quick Evaluation  

## 2015-12-21 NOTE — H&P (Signed)
[name], MD [facility] [address], [place], [place] [address] Phone: [phone] Fax : [phone]  Primary Care Physician:  Einar Pheasant, MD Primary Gastroenterologist:  Dr. Allen Norris  Pre-Procedure History & Physical: HPI:  Zachary Buck is a 75 y.o. male is here for an endoscopy.   Past Medical History:  Diagnosis Date  . Anemia   . Atherosclerotic heart disease    s/p MI, s/p stent mid circumflex  . CAD (coronary artery disease)    Dr Nehemiah Massed, cardiologist  . Cirrhosis of liver ([place])   . Clavicle fracture   . Diabetes mellitus without complication ([place])    oral med  . Esophageal varices ([place])   . Hepatic encephalopathy ([place])   . Hypercholesterolemia   . Hypertension    controlled on meds  . Internal hemorrhoids   . Leg fracture   . Multiple myeloma ([place])   . Myocardial infarct   . Prostate enlargement   . Tubular adenoma of colon     Past Surgical History:  Procedure Laterality Date  . COLONOSCOPY WITH PROPOFOL N/A 03/04/2015   Procedure: COLONOSCOPY WITH PROPOFOL;  Surgeon: Jerene Bears, MD;  Location: [facility];  Service: Gastroenterology;  Laterality: N/A;  . COLONOSCOPY WITH PROPOFOL N/A 12/09/2015   Procedure: COLONOSCOPY WITH PROPOFOL;  Surgeon: [name], MD;  Location: [place];  Service: Endoscopy;  Laterality: N/A;  DIABETIC-ORAL MEDS  . CORONARY ANGIOPLASTY WITH STENT PLACEMENT     2 stents  . ESOPHAGOGASTRODUODENOSCOPY (EGD) WITH PROPOFOL N/A 03/04/2015   Procedure: ESOPHAGOGASTRODUODENOSCOPY (EGD) WITH PROPOFOL;  Surgeon: Jerene Bears, MD;  Location: [facility];  Service: Gastroenterology;  Laterality: N/A;  . ESOPHAGOGASTRODUODENOSCOPY (EGD) WITH PROPOFOL N/A 12/09/2015   Procedure: ESOPHAGOGASTRODUODENOSCOPY (EGD) WITH PROPOFOL;  Surgeon: [name], MD;  Location: [place];  Service: Endoscopy;  Laterality: N/A;  . POLYPECTOMY N/A 12/09/2015   Procedure: POLYPECTOMY;  Surgeon: [name], MD;   Location: [place];  Service: Endoscopy;  Laterality: N/A;  . TONSILLECTOMY      Prior to Admission medications   Medication Sig Start Date End Date Taking? Authorizing Provider  Alpha-Lipoic Acid (LIPOIC ACID PO) Take 200 mg by mouth daily.    Yes Historical Provider, MD  Ascorbic Acid (VITAMIN C) 1000 MG tablet Take 1,000 mg by mouth daily.   Yes Historical Provider, MD  B Complex Vitamins (VITAMIN B COMPLEX) TABS Take by mouth daily.   Yes Historical Provider, MD  Cholecalciferol (VITAMIN D-3) 1000 UNITS CAPS Take by mouth.   Yes Historical Provider, MD  CHROMIUM PO Take 600 mg by mouth daily.    Yes Historical Provider, MD  Cinnamon 500 MG capsule Take 500 mg by mouth.   Yes Historical Provider, MD  Coenzyme Q10 100 MG capsule Take by mouth.   Yes Historical Provider, MD  Ginger, Zingiber officinalis, (GINGER ROOT) 550 MG CAPS Take by mouth daily.   Yes Historical Provider, MD  glipiZIDE (GLUCOTROL) 5 MG tablet TAKE 1 TABLET BY MOUTH EVERY MORNING AND 2 TABLETS EVERY EVENING 10/18/15  Yes Einar Pheasant, MD  lisinopril (PRINIVIL,ZESTRIL) 20 MG tablet TAKE ONE TABLET BY MOUTH EVERY DAY/ AM 01/11/15  Yes Historical Provider, MD  Magnesium 500 MG CAPS Take 500 mg by mouth daily.   Yes Historical Provider, MD  metFORMIN (GLUCOPHAGE) 1000 MG tablet TAKE 1 TABLET(1000 MG) BY MOUTH TWICE DAILY WITH A MEAL 08/12/15  Yes Einar Pheasant, MD  Omega-3 Fatty Acids (FISH OIL) 1000 MG CAPS Take 4,000 mg  by mouth daily.    Yes Historical Provider, MD  [place]   Yes Historical Provider, MD  OVER THE COUNTER MEDICATION 250 MG CITICOLINE   Yes Historical Provider, MD  OVER THE COUNTER MEDICATION    Yes Historical Provider, MD  OVER THE COUNTER MEDICATION    Yes Historical Provider, MD  [place]    Yes Historical Provider, MD  Turmeric POWD 4,000 mg by Does not apply route daily.   Yes Historical Provider, MD  vitamin B-12 (CYANOCOBALAMIN) 100 MCG  tablet Take 200 mcg by mouth daily.   Yes Historical Provider, MD  blood glucose meter kit and supplies KIT Dispense based on patient and insurance preference. Use up to four times daily as directed. (FOR ICD-9 250.00, 250.01). 03/16/14   Einar Pheasant, MD  blood glucose meter kit and supplies KIT Dispense based on patient and insurance preference. Use up to four times daily as directed. E11.9 Please dispense Freestyle freedom meter and supplies. 09/09/15   Jayce G Cook, DO  glucose blood (BAYER CONTOUR NEXT TEST) test strip Contour Next EZ test strips: check blood sugars twice a day. Dx: 250.00 05/07/13   Historical Provider, MD  glucose blood (ONE TOUCH ULTRA TEST) test strip Use as instructed 11/18/15   Einar Pheasant, MD  Lancets (ONETOUCH ULTRASOFT) lancets Use as instructed 03/16/14   Einar Pheasant, MD  nadolol (CORGARD) 20 MG tablet Take 1 tablet (20 mg total) by mouth daily. Patient not taking: Reported on 12/21/2015 03/04/15   Jerene Bears, MD  OVER THE COUNTER MEDICATION     Historical Provider, MD  SBI/Protein Isolate [facility]) 5 g PACK Take 1 packet by mouth daily. Patient not taking: Reported on 12/20/2015 07/26/15   Lequita Asal, MD  sildenafil (REVATIO) 20 MG tablet 3 to 5 tablets per day or as instructed 09/01/15   Historical Provider, MD  Theanine 100 MG CAPS Take by mouth daily.    Historical Provider, MD  triamcinolone cream (KENALOG) 0.1 % Apply topically 2 (two) times daily. Please avoid face and genitalia area.  Do not use in the same spot for more than 7-10 days in a row. Patient taking differently: Apply topically daily. Please avoid face and genitalia area.  Do not use in the same spot for more than 7-10 days in a row. 09/28/15   Einar Pheasant, MD    Allergies as of 12/20/2015 - Review Complete 12/20/2015  Allergen Reaction Noted  . Niaspan [niacin er] Other (See Comments) 03/19/2012    Family History  Problem Relation Age of Onset  . Aneurysm Father 35    of the  heart  . Heart attack Father   . Diabetes Mother     Social History   Social History  . Marital status: Married    Spouse name: N/A  . Number of children: 2  . Years of education: N/A   Occupational History  . Estate manager/land agent   Social History Main Topics  . Smoking status: Never Smoker  . Smokeless tobacco: Never Used  . Alcohol use No  . Drug use: No  . Sexual activity: Not on file   Other Topics Concern  . Not on file   Social History Narrative  . No narrative on file    Review of Systems: See HPI, otherwise negative ROS  Physical Exam: BP (!) 154/62   Pulse 69   Temp 97.1 F (36.2 C) (Tympanic)   Resp 18   Ht  5' 9"  (1.753 m)   Wt 178 lb (80.7 kg)   SpO2 99%   BMI 26.29 kg/m  General:   Alert,  pleasant and cooperative in NAD Head:  Normocephalic and atraumatic. Neck:  Supple; no masses or thyromegaly. Lungs:  Clear throughout to auscultation.    Heart:  Regular rate and rhythm. Abdomen:  Soft, nontender and nondistended. Normal bowel sounds, without guarding, and without rebound.   Neurologic:  Alert and  oriented x4;  grossly normal neurologically.  Impression/Plan: Zachary Buck is here for an endoscopy to be performed for melena  Risks, benefits, limitations, and alternatives regarding  endoscopy have been reviewed with the patient.  Questions have been answered.  All parties agreeable.   [name], MD  12/21/2015, 9:39 AM

## 2015-12-21 NOTE — Op Note (Signed)
[facility] Patient Name: Zachary Buck Procedure Date: 12/21/2015 9:54 AM MRN: [id number] Account #: [id number] Date of Birth: [birth year] Admit Type: Outpatient Age: 75 Room: [facility] ENDO ROOM 4 Gender: Male Note Status: Finalized Procedure:            Upper GI endoscopy Indications:          Melena Providers:            [name] MD, MD Referring MD:         Einar Pheasant, MD (Referring MD) Medicines:            Propofol per Anesthesia Complications:        No immediate complications. Procedure:            Pre-Anesthesia Assessment:                       - Prior to the procedure, a History and Physical was                        performed, and patient medications and allergies were                        reviewed. The patient's tolerance of previous                        anesthesia was also reviewed. The risks and benefits of                        the procedure and the sedation options and risks were                        discussed with the patient. All questions were                        answered, and informed consent was obtained. Prior                        Anticoagulants: The patient has taken no previous                        anticoagulant or antiplatelet agents. ASA Grade                        Assessment: III - A patient with severe systemic                        disease. After reviewing the risks and benefits, the                        patient was deemed in satisfactory condition to undergo                        the procedure.                       After obtaining informed consent, the endoscope was                        passed under direct vision. Throughout the procedure,  the patient's blood pressure, pulse, and oxygen                        saturations were monitored continuously. The Endoscope                        was introduced through the mouth, and advanced to the     second part of duodenum. The upper GI endoscopy was                        accomplished without difficulty. The patient tolerated                        the procedure well. Findings:      Grade III varices were found in the middle third of the esophagus and in       the lower third of the esophagus. Five bands were successfully placed       with incomplete eradication of varices. There was no bleeding during the       procedure.      One 7 mm sessile polyp with bleeding and stigmata of recent bleeding was       found in the gastric body. To stop active bleeding, one hemostatic clip       was successfully placed (MR conditional). There was no bleeding at the       end of the procedure.      One 12 mm sessile polyp with bleeding and stigmata of recent bleeding       was found in the gastric antrum. For hemostasis, one hemostatic clip was       successfully placed (MR conditional). There was no bleeding at the end       of the procedure.      One 4 mm sessile polyp with bleeding was found in the first portion of       the duodenum. For hemostasis, one hemostatic clip was successfully       placed (MR conditional). There was no bleeding at the end of the       procedure. Impression:           - Grade III esophageal varices. Incompletely                        eradicated. Banded.                       - One gastric polyp. Clip (MR conditional) was placed.                       - One gastric polyp. Clip (MR conditional) was placed.                       - One duodenal polyp. Clip (MR conditional) was placed.                       - No specimens collected. Recommendation:       - Discharge patient to home.                       - Soft diet for 4 days.                       -  Continue present medications. Procedure Code(s):    --- Professional ---                       [phone], Esophagogastroduodenoscopy, flexible, transoral;                        with band ligation of esophageal/gastric  varices                       43255, 59, Esophagogastroduodenoscopy, flexible,                        transoral; with control of bleeding, any method Diagnosis Code(s):    --- Professional ---                       K92.1, Melena (includes Hematochezia)                       I85.00, Esophageal varices without bleeding                       K31.7, Polyp of stomach and duodenum CPT copyright 2016 [employer]. All rights reserved. The codes documented in this report are preliminary and upon coder review may  be revised to meet current compliance requirements. [name] MD, MD 12/21/2015 10:17:46 AM This report has been signed electronically. Number of Addenda: 0 Note Initiated On: 12/21/2015 9:54 AM      [facility]

## 2015-12-21 NOTE — Progress Notes (Signed)
Order placed for f/u tsh.  

## 2015-12-22 ENCOUNTER — Encounter: Payer: Self-pay | Admitting: Gastroenterology

## 2015-12-22 ENCOUNTER — Ambulatory Visit: Payer: PPO | Admitting: Gastroenterology

## 2015-12-24 ENCOUNTER — Telehealth: Payer: Self-pay

## 2015-12-24 NOTE — Telephone Encounter (Signed)
Patients wife called with some questions.  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  2. When can he resume normal activity? I answered in a week.   Patient wants Ginger to call her on Monday.

## 2015-12-24 NOTE — Telephone Encounter (Signed)
Patient's wife called in once again. 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  I explained that note written states to continue all medications as prescribed.  I also told patient's wife that he would benefit from taking this week off and then return next week when he is feeling better and has more energy.  She verbalizes understanding of this.

## 2015-12-27 ENCOUNTER — Telehealth: Payer: Self-pay | Admitting: Gastroenterology

## 2015-12-27 NOTE — Telephone Encounter (Signed)
Patient's wife called and has some questions regarding medications.

## 2015-12-28 ENCOUNTER — Inpatient Hospital Stay: Payer: PPO

## 2015-12-29 ENCOUNTER — Encounter: Payer: Self-pay | Admitting: Internal Medicine

## 2015-12-29 ENCOUNTER — Ambulatory Visit (INDEPENDENT_AMBULATORY_CARE_PROVIDER_SITE_OTHER): Payer: PPO | Admitting: Internal Medicine

## 2015-12-29 DIAGNOSIS — I8510 Secondary esophageal varices without bleeding: Secondary | ICD-10-CM

## 2015-12-29 DIAGNOSIS — E7800 Pure hypercholesterolemia, unspecified: Secondary | ICD-10-CM

## 2015-12-29 DIAGNOSIS — Z23 Encounter for immunization: Secondary | ICD-10-CM | POA: Diagnosis not present

## 2015-12-29 DIAGNOSIS — D696 Thrombocytopenia, unspecified: Secondary | ICD-10-CM

## 2015-12-29 DIAGNOSIS — I2510 Atherosclerotic heart disease of native coronary artery without angina pectoris: Secondary | ICD-10-CM

## 2015-12-29 DIAGNOSIS — E119 Type 2 diabetes mellitus without complications: Secondary | ICD-10-CM

## 2015-12-29 DIAGNOSIS — I10 Essential (primary) hypertension: Secondary | ICD-10-CM

## 2015-12-29 DIAGNOSIS — C9000 Multiple myeloma not having achieved remission: Secondary | ICD-10-CM | POA: Diagnosis not present

## 2015-12-29 DIAGNOSIS — K766 Portal hypertension: Secondary | ICD-10-CM | POA: Diagnosis not present

## 2015-12-29 DIAGNOSIS — D500 Iron deficiency anemia secondary to blood loss (chronic): Secondary | ICD-10-CM

## 2015-12-29 NOTE — Progress Notes (Signed)
Patient ID: Zachary Buck, male   DOB: Oct 28, 1940, 75 y.o.   MRN: [id number]   Subjective:    Patient ID: Zachary Buck, male    DOB: Feb 23, 1940, 75 y.o.   MRN: [id number]  HPI  Patient here for a scheduled follow up.  He reports he feels better now than he has in a long time.  Recently evaluated by Dr Allen Norris.  S/p EGD.  See report.  S/p clips x 3 placed.  Found to have grade III esophageal varices.  States feels better.  Has been receiving IV iron.  Has f/u planned with hematology next week.  No chest pain.  Does report some sob with exertion.  He related this to deconditioning. Discussed with him regarding EKG and further w/up.  He declines.  Still with some abdominal discomfort.  Eating.  No blood in his stool.  Bowels are doing better.  He stopped nadolol.  Discussed lab results with him.  Improved sugar and triglycerides.     Past Medical History:  Diagnosis Date  . Anemia   . Atherosclerotic heart disease    s/p MI, s/p stent mid circumflex  . CAD (coronary artery disease)    Dr Nehemiah Massed, cardiologist  . Cirrhosis of liver ([place])   . Clavicle fracture   . Diabetes mellitus without complication ([place])    oral med  . Esophageal varices ([place])   . Hepatic encephalopathy ([place])   . Hypercholesterolemia   . Hypertension    controlled on meds  . Internal hemorrhoids   . Leg fracture   . Multiple myeloma ([place])   . Myocardial infarct   . Prostate enlargement   . Tubular adenoma of colon    Past Surgical History:  Procedure Laterality Date  . COLONOSCOPY WITH PROPOFOL N/A 03/04/2015   Procedure: COLONOSCOPY WITH PROPOFOL;  Surgeon: Jerene Bears, MD;  Location: [facility];  Service: Gastroenterology;  Laterality: N/A;  . COLONOSCOPY WITH PROPOFOL N/A 12/09/2015   Procedure: COLONOSCOPY WITH PROPOFOL;  Surgeon: [name], MD;  Location: [facility];  Service: Endoscopy;  Laterality: N/A;  DIABETIC-ORAL MEDS  . CORONARY ANGIOPLASTY WITH STENT PLACEMENT     2  stents  . ESOPHAGOGASTRODUODENOSCOPY (EGD) WITH PROPOFOL N/A 03/04/2015   Procedure: ESOPHAGOGASTRODUODENOSCOPY (EGD) WITH PROPOFOL;  Surgeon: Jerene Bears, MD;  Location: [facility];  Service: Gastroenterology;  Laterality: N/A;  . ESOPHAGOGASTRODUODENOSCOPY (EGD) WITH PROPOFOL N/A 12/09/2015   Procedure: ESOPHAGOGASTRODUODENOSCOPY (EGD) WITH PROPOFOL;  Surgeon: [name], MD;  Location: [facility];  Service: Endoscopy;  Laterality: N/A;  . ESOPHAGOGASTRODUODENOSCOPY (EGD) WITH PROPOFOL N/A 12/21/2015   Procedure: ESOPHAGOGASTRODUODENOSCOPY (EGD) WITH PROPOFOL;  Surgeon: [name], MD;  Location: [facility];  Service: Endoscopy;  Laterality: N/A;  . POLYPECTOMY N/A 12/09/2015   Procedure: POLYPECTOMY;  Surgeon: [name], MD;  Location: [facility];  Service: Endoscopy;  Laterality: N/A;  . TONSILLECTOMY     Family History  Problem Relation Age of Onset  . Aneurysm Father 75    of the heart  . Heart attack Father   . Diabetes Mother    Social History   Social History  . Marital status: Married    Spouse name: N/A  . Number of children: 2  . Years of education: N/A   Occupational History  . Estate manager/land agent   Social History Main Topics  . Smoking status: Never Smoker  . Smokeless tobacco: Never Used  . Alcohol use No  . Drug use: No  . Sexual activity:  Not Asked   Other Topics Concern  . None   Social History Narrative  . None    Outpatient Encounter Prescriptions as of 12/29/2015  Medication Sig  . glipiZIDE (GLUCOTROL) 5 MG tablet TAKE 1 TABLET BY MOUTH EVERY MORNING AND 2 TABLETS EVERY EVENING  . glucose blood (BAYER CONTOUR NEXT TEST) test strip Contour Next EZ test strips: check blood sugars twice a day. Dx: 250.00  . glucose blood (ONE TOUCH ULTRA TEST) test strip Use as instructed  . Lancets (ONETOUCH ULTRASOFT) lancets Use as instructed  . lisinopril (PRINIVIL,ZESTRIL) 20 MG tablet TAKE ONE TABLET BY MOUTH EVERY DAY/ AM  . metFORMIN  (GLUCOPHAGE) 1000 MG tablet TAKE 1 TABLET(1000 MG) BY MOUTH TWICE DAILY WITH A MEAL  . sildenafil (REVATIO) 20 MG tablet 3 to 5 tablets per day or as instructed  . Alpha-Lipoic Acid (LIPOIC ACID PO) Take 200 mg by mouth daily.   . Ascorbic Acid (VITAMIN C) 1000 MG tablet Take 1,000 mg by mouth daily.  . B Complex Vitamins (VITAMIN B COMPLEX) TABS Take by mouth daily.  . blood glucose meter kit and supplies KIT Dispense based on patient and insurance preference. Use up to four times daily as directed. (FOR ICD-9 250.00, 250.01). (Patient not taking: Reported on 12/29/2015)  . blood glucose meter kit and supplies KIT Dispense based on patient and insurance preference. Use up to four times daily as directed. E11.9 Please dispense Freestyle freedom meter and supplies. (Patient not taking: Reported on 12/29/2015)  . Cholecalciferol (VITAMIN D-3) 1000 UNITS CAPS Take by mouth.  . CHROMIUM PO Take 600 mg by mouth daily.   . Cinnamon 500 MG capsule Take 500 mg by mouth.  . Coenzyme Q10 100 MG capsule Take by mouth.  . Ginger, Zingiber officinalis, (GINGER ROOT) 550 MG CAPS Take by mouth daily.  . Magnesium 500 MG CAPS Take 500 mg by mouth daily.  . Omega-3 Fatty Acids (FISH OIL) 1000 MG CAPS Take 4,000 mg by mouth daily.   Marland Kitchen OVER THE COUNTER MEDICATION BUPLEURUM  . OVER THE COUNTER MEDICATION 250 MG CITICOLINE  . OVER THE COUNTER MEDICATION   . OVER THE COUNTER MEDICATION   . OVER THE COUNTER MEDICATION   . OVER THE COUNTER MEDICATION   . SBI/Protein Isolate (ENTERAGAM) 5 g PACK Take 1 packet by mouth daily. (Patient not taking: Reported on 12/29/2015)  . Theanine 100 MG CAPS Take by mouth daily.  Marland Kitchen triamcinolone cream (KENALOG) 0.1 % Apply topically 2 (two) times daily. Please avoid face and genitalia area.  Do not use in the same spot for more than 7-10 days in a row. (Patient not taking: Reported on 12/29/2015)  . Turmeric POWD 4,000 mg by Does not apply route daily.  . vitamin B-12 (CYANOCOBALAMIN)  100 MCG tablet Take 200 mcg by mouth daily.  . [DISCONTINUED] nadolol (CORGARD) 20 MG tablet Take 1 tablet (20 mg total) by mouth daily. (Patient not taking: Reported on 12/29/2015)   Facility-Administered Encounter Medications as of 12/29/2015  Medication  . alteplase (CATHFLO ACTIVASE) injection 2 mg  . heparin lock flush 100 unit/mL  . heparin lock flush 100 unit/mL  . sodium chloride 0.9 % injection 10 mL  . sodium chloride 0.9 % injection 3 mL    Review of Systems  Constitutional: Negative for appetite change and unexpected weight change.  HENT: Negative for congestion and sinus pain.   Respiratory: Positive for shortness of breath. Negative for cough and chest tightness.   Cardiovascular:  Negative for chest pain, palpitations and leg swelling.  Gastrointestinal: Positive for abdominal pain. Negative for diarrhea, nausea and vomiting.  Genitourinary: Negative for difficulty urinating and dysuria.  Musculoskeletal: Negative for back pain and joint swelling.  Skin: Negative for color change and rash.  Neurological: Negative for dizziness, light-headedness and headaches.  Psychiatric/Behavioral: Negative for agitation and dysphoric mood.       Objective:     Blood pressure prior to leaving 142/80  Physical Exam  Constitutional: He appears well-developed and well-nourished. No distress.  HENT:  Nose: Nose normal.  Mouth/Throat: Oropharynx is clear and moist.  Neck: Neck supple. No thyromegaly present.  Cardiovascular: Normal rate and regular rhythm.   Pulmonary/Chest: Effort normal and breath sounds normal. No respiratory distress.  Abdominal: Soft. Bowel sounds are normal. There is tenderness.  Minimal tenderness to palpation over the epigastric region.   Musculoskeletal: He exhibits no edema or tenderness.  Lymphadenopathy:    He has no cervical adenopathy.  Skin: No rash noted. No erythema.  Psychiatric: He has a normal mood and affect. His behavior is normal.    BP  (!) 142/80   Pulse 76   Temp 98.3 F (36.8 C) (Oral)   Ht 5' 9"  (1.753 m)   Wt 187 lb 3.2 oz (84.9 kg)   SpO2 95%   BMI 27.64 kg/m  Wt Readings from Last 3 Encounters:  12/29/15 187 lb 3.2 oz (84.9 kg)  12/21/15 178 lb (80.7 kg)  12/20/15 178 lb (80.7 kg)     Lab Results  Component Value Date   WBC 4.5 12/13/2015   HGB 9.6 (L) 12/13/2015   HCT 28.3 (L) 12/13/2015   PLT 95 (L) 12/13/2015   GLUCOSE 196 (H) 12/17/2015   CHOL 154 12/17/2015   TRIG 266.0 (H) 12/17/2015   HDL 25.80 (L) 12/17/2015   LDLDIRECT 56.0 12/17/2015   LDLCALC 51 05/14/2013   ALT 27 12/17/2015   AST 26 12/17/2015   NA 139 12/17/2015   K 4.8 12/17/2015   CL 107 12/17/2015   CREATININE 1.15 12/17/2015   BUN 18 12/17/2015   CO2 26 12/17/2015   TSH 6.49 (H) 12/17/2015   INR 1.1 (H) 08/25/2015   HGBA1C 6.8 (H) 12/17/2015   MICROALBUR 22.4 (H) 04/29/2015       Assessment & Plan:   Problem List Items Addressed This Visit    CAD (coronary artery disease)    Continues f/u with cardiology.  Declines further w/up at this time as outlined.  Overall feels stable.       Diabetes ([place])    a1c improved on recent check - 6.8.  Low carb diet and exercise.  Follow met b and a1c.  On metformin.        Essential hypertension, benign    Blood pressure improved on recheck.  Discussed early return.  He declines.  Have him follow pressure.  Notify me if persistent elevation.        Hypercholesterolemia    Low cholesterol diet and exercise.  Discussed recent lab results.  Triglycerides significantly improved.  Follow.  Continue low carb diet.        Iron deficiency anemia    Seeing hematology.  Receiving IV iron.  Has not noticed any bleeding recently.  Follow.        Multiple myeloma ([place])    Followed by hematology.  Has f/u scheduled next week.        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  Was on nadolol.  He stopped the medication.  Just evaluated by GI.         Secondary esophageal varices without bleeding ([facility])    Seeing GI.  Just had EGD as outlined.  Was on nadolol.  Stopped.        Thrombocytopenia ([place])    Followed by hematology.        Other Visit Diagnoses    Encounter for immunization       Relevant Orders   Flu vaccine HIGH DOSE PF (Completed)       Einar Pheasant, MD

## 2015-12-29 NOTE — Progress Notes (Signed)
Pre visit review using our clinic review tool, if applicable. No additional management support is needed unless otherwise documented below in the visit note. 

## 2015-12-30 ENCOUNTER — Ambulatory Visit: Admission: RE | Admit: 2015-12-30 | Payer: PPO | Source: Ambulatory Visit | Admitting: Gastroenterology

## 2015-12-30 ENCOUNTER — Encounter: Payer: Self-pay | Admitting: Internal Medicine

## 2015-12-30 ENCOUNTER — Encounter: Admission: RE | Payer: Self-pay | Source: Ambulatory Visit

## 2015-12-30 SURGERY — ESOPHAGOGASTRODUODENOSCOPY (EGD) WITH PROPOFOL
Anesthesia: Choice

## 2015-12-30 NOTE — Assessment & Plan Note (Signed)
Has cryptogenic cirrhosis with portal hypertension.  Was on nadolol.  He stopped the medication.  Just evaluated by GI.

## 2015-12-30 NOTE — Assessment & Plan Note (Signed)
Followed by hematology.  Has f/u scheduled next week.

## 2015-12-30 NOTE — Assessment & Plan Note (Signed)
a1c improved on recent check - 6.8.  Low carb diet and exercise.  Follow met b and a1c.  On metformin.

## 2015-12-30 NOTE — Assessment & Plan Note (Addendum)
Continues f/u with cardiology.  Declines further w/up at this time as outlined.  Overall feels stable.

## 2015-12-30 NOTE — Assessment & Plan Note (Signed)
Followed by hematology 

## 2015-12-30 NOTE — Assessment & Plan Note (Signed)
Low cholesterol diet and exercise.  Discussed recent lab results.  Triglycerides significantly improved.  Follow.  Continue low carb diet.

## 2015-12-30 NOTE — Assessment & Plan Note (Signed)
Seeing hematology.  Receiving IV iron.  Has not noticed any bleeding recently.  Follow.

## 2015-12-30 NOTE — Assessment & Plan Note (Signed)
Blood pressure improved on recheck.  Discussed early return.  He declines.  Have him follow pressure.  Notify me if persistent elevation.

## 2015-12-30 NOTE — Assessment & Plan Note (Signed)
Seeing GI.  Just had EGD as outlined.  Was on nadolol.  Stopped.

## 2016-01-03 ENCOUNTER — Other Ambulatory Visit: Payer: Self-pay

## 2016-01-03 ENCOUNTER — Ambulatory Visit (INDEPENDENT_AMBULATORY_CARE_PROVIDER_SITE_OTHER): Payer: PPO | Admitting: Gastroenterology

## 2016-01-03 ENCOUNTER — Encounter: Payer: Self-pay | Admitting: Gastroenterology

## 2016-01-03 VITALS — BP 157/79 | HR 103 | Temp 98.6°F | Ht 69.0 in | Wt 184.0 lb

## 2016-01-03 DIAGNOSIS — R188 Other ascites: Secondary | ICD-10-CM | POA: Diagnosis not present

## 2016-01-03 DIAGNOSIS — K7460 Unspecified cirrhosis of liver: Secondary | ICD-10-CM | POA: Diagnosis not present

## 2016-01-03 MED ORDER — FUROSEMIDE 20 MG PO TABS: 20.0000 mg | ORAL_TABLET | Freq: Two times a day (BID) | ORAL | 3 refills | Status: DC

## 2016-01-03 MED ORDER — SPIRONOLACTONE 50 MG PO TABS: 50.0000 mg | ORAL_TABLET | Freq: Every day | ORAL | 3 refills | Status: DC

## 2016-01-03 NOTE — Progress Notes (Signed)
Primary Care Physician: Einar Pheasant, MD  Primary Gastroenterologist:  Dr. [name]  Chief Complaint  Patient presents with  . Abdominal swelling    HPI: Zachary Buck is a 75 y.o. male here for follow-up after having esophageal banding. The patient states that his abdomen has been progressively getting bigger over the last couple of months. The patient's wife states that it is now bigger than she seen it in the past. He reports that the distended abdomen is causing him to have some abdominal pain.  Current Outpatient Prescriptions  Medication Sig Dispense Refill  . Alpha-Lipoic Acid (LIPOIC ACID PO) Take 200 mg by mouth daily.     . Ascorbic Acid (VITAMIN C) 1000 MG tablet Take 1,000 mg by mouth daily.    . B Complex Vitamins (VITAMIN B COMPLEX) TABS Take by mouth daily.    . Cholecalciferol (VITAMIN D-3) 1000 UNITS CAPS Take by mouth.    . CHROMIUM PO Take 600 mg by mouth daily.     . Cinnamon 500 MG capsule Take 500 mg by mouth.    . Coenzyme Q10 100 MG capsule Take by mouth.    . Ginger, Zingiber officinalis, (GINGER ROOT) 550 MG CAPS Take by mouth daily.    Marland Kitchen glipiZIDE (GLUCOTROL) 5 MG tablet TAKE 1 TABLET BY MOUTH EVERY MORNING AND 2 TABLETS EVERY EVENING 270 tablet 2  . glucose blood (BAYER CONTOUR NEXT TEST) test strip Contour Next EZ test strips: check blood sugars twice a day. Dx: 250.00    . glucose blood (ONE TOUCH ULTRA TEST) test strip Use as instructed 100 each 12  . Lancets (ONETOUCH ULTRASOFT) lancets Use as instructed 100 each 12  . lisinopril (PRINIVIL,ZESTRIL) 20 MG tablet TAKE ONE TABLET BY MOUTH EVERY DAY/ AM    . Magnesium 500 MG CAPS Take 500 mg by mouth daily.    . metFORMIN (GLUCOPHAGE) 1000 MG tablet TAKE 1 TABLET(1000 MG) BY MOUTH TWICE DAILY WITH A MEAL 60 tablet 5  . Omega-3 Fatty Acids (FISH OIL) 1000 MG CAPS Take 4,000 mg by mouth daily.     Marland Kitchen OVER THE COUNTER MEDICATION BUPLEURUM    . OVER THE COUNTER MEDICATION 250 MG CITICOLINE      . OVER THE COUNTER MEDICATION     . OVER THE COUNTER MEDICATION     . OVER THE COUNTER MEDICATION     . OVER THE COUNTER MEDICATION     . sildenafil (REVATIO) 20 MG tablet 3 to 5 tablets per day or as instructed    . Theanine 100 MG CAPS Take by mouth daily.    . Turmeric POWD 4,000 mg by Does not apply route daily.    . vitamin B-12 (CYANOCOBALAMIN) 100 MCG tablet Take 200 mcg by mouth daily.    . blood glucose meter kit and supplies KIT Dispense based on patient and insurance preference. Use up to four times daily as directed. (FOR ICD-9 250.00, 250.01). (Patient not taking: Reported on 01/03/2016) 1 each 0  . blood glucose meter kit and supplies KIT Dispense based on patient and insurance preference. Use up to four times daily as directed. E11.9 Please dispense Freestyle freedom meter and supplies. (Patient not taking: Reported on 01/03/2016) 1 each 0  . SBI/Protein Isolate (ENTERAGAM) 5 g PACK Take 1 packet by mouth daily. (Patient not taking: Reported on 01/03/2016) 30 each 3  . triamcinolone cream (KENALOG) 0.1 % Apply topically 2 (two) times daily. Please avoid face and genitalia area.  Do not use in the same spot for more than 7-10 days in a row. (Patient not taking: Reported on 01/03/2016) 80 g 0   No current facility-administered medications for this visit.    Facility-Administered Medications Ordered in Other Visits  Medication Dose Route Frequency Provider Last Rate Last Dose  . alteplase (CATHFLO ACTIVASE) injection 2 mg  2 mg Intracatheter Once PRN Lequita Asal, MD      . heparin lock flush 100 unit/mL  500 Units Intracatheter Once PRN Lequita Asal, MD      . heparin lock flush 100 unit/mL  250 Units Intracatheter Once PRN Lequita Asal, MD      . sodium chloride 0.9 % injection 10 mL  10 mL Intracatheter PRN Lequita Asal, MD      . sodium chloride 0.9 % injection 3 mL  3 mL Intravenous Once PRN Lequita Asal, MD        Allergies as of 01/03/2016 -  Review Complete 01/03/2016  Allergen Reaction Noted  . Niaspan [niacin er] Other (See Comments) 03/19/2012    ROS:  General: Negative for anorexia, weight loss, fever, chills, fatigue, weakness. ENT: Negative for hoarseness, difficulty swallowing , nasal congestion. CV: Negative for chest pain, angina, palpitations, dyspnea on exertion, peripheral edema.  Respiratory: Negative for dyspnea at rest, dyspnea on exertion, cough, sputum, wheezing.  GI: See history of present illness. GU:  Negative for dysuria, hematuria, urinary incontinence, urinary frequency, nocturnal urination.  Endo: Negative for unusual weight change.    Physical Examination:   BP (!) 157/79   Pulse (!) 103   Temp 98.6 F (37 C) (Oral)   Ht 5' 9"  (1.753 m)   Wt 184 lb (83.5 kg)   BMI 27.17 kg/m   General: Well-nourished, well-developed in no acute distress.  Eyes: No icterus. Conjunctivae pink. Mouth: Oropharyngeal mucosa moist and pink , no lesions erythema or exudate. Lungs: Clear to auscultation bilaterally. Non-labored. Heart: Regular rate and rhythm, no murmurs rubs or gallops.  Abdomen: Bowel sounds with fluid wave and shifting dullness.   Extremities: No lower extremity edema. No clubbing or deformities. Neuro: Alert and oriented x 3.  Grossly intact. Skin: Warm and dry, no jaundice.   Psych: Alert and cooperative, normal mood and affect.  Labs:    Imaging Studies: No results found.  Assessment and Plan:   Zachary Buck is a 75 y.o. y/o male with worsening ascites. The patient states that since having his esophageal banding and coming off his beta blocker for variceal bleeding prophylaxis, he has felt better now than he has in years. The patient will need a repeat banding of his esophagus in January. Will also be started on Lasix and Aldactone with his labs checked in one week's time. It will be titrated from there.    [name], MD. [name]   Note: This dictation was prepared with  Dragon dictation along with smaller phrase technology. Any transcriptional errors that result from this process are unintentional.

## 2016-01-04 ENCOUNTER — Other Ambulatory Visit: Payer: Self-pay

## 2016-01-04 NOTE — Telephone Encounter (Signed)
Order has been put in. I have sent prior authoration via fax to silver back management. Patient has [employer].   I will wait to receive approval

## 2016-01-04 NOTE — Telephone Encounter (Signed)
I called the patients wife back. She would like the EGD scheduled on 01/24/16 [facility] with Dr. Allen Norris. If you want me to add the orders, send me the diagnostic code and I'll do it when possible.

## 2016-01-04 NOTE — Telephone Encounter (Signed)
They made a decision on when they are going to have the endoscopy. Husband wants to get it done the first of January right after new years. Please call wife at [phone]. If you don't have time, attach the Diagnostic code and I will schedule when I have time.

## 2016-01-04 NOTE — Telephone Encounter (Signed)
Esophageal varices without bleeding I85.00. Thank you for doing the order.

## 2016-01-05 NOTE — Telephone Encounter (Signed)
CPT code [phone] does not require pre-certification. If you have any questions for [employer], please contact our call center at [phone]

## 2016-01-11 ENCOUNTER — Encounter: Payer: Self-pay | Admitting: Hematology and Oncology

## 2016-01-11 ENCOUNTER — Inpatient Hospital Stay: Payer: PPO | Attending: Hematology and Oncology

## 2016-01-11 ENCOUNTER — Inpatient Hospital Stay (HOSPITAL_BASED_OUTPATIENT_CLINIC_OR_DEPARTMENT_OTHER): Payer: PPO | Admitting: Hematology and Oncology

## 2016-01-11 ENCOUNTER — Telehealth: Payer: Self-pay | Admitting: *Deleted

## 2016-01-11 ENCOUNTER — Telehealth: Payer: Self-pay | Admitting: Gastroenterology

## 2016-01-11 VITALS — BP 135/78 | HR 76 | Temp 95.8°F | Resp 18 | Wt 173.3 lb

## 2016-01-11 DIAGNOSIS — D509 Iron deficiency anemia, unspecified: Secondary | ICD-10-CM

## 2016-01-11 DIAGNOSIS — I10 Essential (primary) hypertension: Secondary | ICD-10-CM | POA: Diagnosis not present

## 2016-01-11 DIAGNOSIS — C9000 Multiple myeloma not having achieved remission: Secondary | ICD-10-CM | POA: Diagnosis not present

## 2016-01-11 DIAGNOSIS — J849 Interstitial pulmonary disease, unspecified: Secondary | ICD-10-CM | POA: Diagnosis not present

## 2016-01-11 DIAGNOSIS — D696 Thrombocytopenia, unspecified: Secondary | ICD-10-CM | POA: Diagnosis not present

## 2016-01-11 DIAGNOSIS — R161 Splenomegaly, not elsewhere classified: Secondary | ICD-10-CM | POA: Insufficient documentation

## 2016-01-11 DIAGNOSIS — R188 Other ascites: Secondary | ICD-10-CM | POA: Insufficient documentation

## 2016-01-11 DIAGNOSIS — K648 Other hemorrhoids: Secondary | ICD-10-CM | POA: Insufficient documentation

## 2016-01-11 DIAGNOSIS — I2510 Atherosclerotic heart disease of native coronary artery without angina pectoris: Secondary | ICD-10-CM | POA: Diagnosis not present

## 2016-01-11 DIAGNOSIS — E7800 Pure hypercholesterolemia, unspecified: Secondary | ICD-10-CM | POA: Insufficient documentation

## 2016-01-11 DIAGNOSIS — N400 Enlarged prostate without lower urinary tract symptoms: Secondary | ICD-10-CM | POA: Insufficient documentation

## 2016-01-11 DIAGNOSIS — Z79899 Other long term (current) drug therapy: Secondary | ICD-10-CM

## 2016-01-11 DIAGNOSIS — K7469 Other cirrhosis of liver: Secondary | ICD-10-CM | POA: Insufficient documentation

## 2016-01-11 DIAGNOSIS — D500 Iron deficiency anemia secondary to blood loss (chronic): Secondary | ICD-10-CM

## 2016-01-11 DIAGNOSIS — E119 Type 2 diabetes mellitus without complications: Secondary | ICD-10-CM | POA: Diagnosis not present

## 2016-01-11 DIAGNOSIS — I252 Old myocardial infarction: Secondary | ICD-10-CM | POA: Diagnosis not present

## 2016-01-11 DIAGNOSIS — Z77090 Contact with and (suspected) exposure to asbestos: Secondary | ICD-10-CM | POA: Diagnosis not present

## 2016-01-11 LAB — CBC WITH DIFFERENTIAL/PLATELET
Basophils Absolute: 0.1 10*3/uL (ref 0–0.1)
Basophils Relative: 1 %
Eosinophils Absolute: 0.5 10*3/uL (ref 0–0.7)
Eosinophils Relative: 9 %
HCT: 26.7 % — ABNORMAL LOW (ref 40.0–52.0)
Hemoglobin: 8.8 g/dL — ABNORMAL LOW (ref 13.0–18.0)
Lymphocytes Relative: 22 %
Lymphs Abs: 1.3 10*3/uL (ref 1.0–3.6)
MCH: 26.5 pg (ref 26.0–34.0)
MCHC: 33.1 g/dL (ref 32.0–36.0)
MCV: 80.1 fL (ref 80.0–100.0)
Monocytes Absolute: 0.5 10*3/uL (ref 0.2–1.0)
Monocytes Relative: 10 %
Neutro Abs: 3.2 10*3/uL (ref 1.4–6.5)
Neutrophils Relative %: 58 %
Platelets: 131 10*3/uL — ABNORMAL LOW (ref 150–440)
RBC: 3.34 MIL/uL — ABNORMAL LOW (ref 4.40–5.90)
RDW: 18 % — ABNORMAL HIGH (ref 11.5–14.5)
WBC: 5.6 10*3/uL (ref 3.8–10.6)

## 2016-01-11 LAB — COMPREHENSIVE METABOLIC PANEL
ALT: 23 U/L (ref 17–63)
AST: 25 U/L (ref 15–41)
Albumin: 3.2 g/dL — ABNORMAL LOW (ref 3.5–5.0)
Alkaline Phosphatase: 66 U/L (ref 38–126)
Anion gap: 7 (ref 5–15)
BUN: 27 mg/dL — ABNORMAL HIGH (ref 6–20)
CO2: 24 mmol/L (ref 22–32)
Calcium: 9.5 mg/dL (ref 8.9–10.3)
Chloride: 103 mmol/L (ref 101–111)
Creatinine, Ser: 1.45 mg/dL — ABNORMAL HIGH (ref 0.61–1.24)
GFR calc Af Amer: 53 mL/min — ABNORMAL LOW (ref >60)
GFR calc non Af Amer: 46 mL/min — ABNORMAL LOW (ref >60)
Glucose, Bld: 166 mg/dL — ABNORMAL HIGH (ref 65–99)
Potassium: 5 mmol/L (ref 3.5–5.1)
Sodium: 134 mmol/L — ABNORMAL LOW (ref 135–145)
Total Bilirubin: 0.9 mg/dL (ref 0.3–1.2)
Total Protein: 8.5 g/dL — ABNORMAL HIGH (ref 6.5–8.1)

## 2016-01-11 LAB — FERRITIN: Ferritin: 10 ng/mL — ABNORMAL LOW (ref 24–336)

## 2016-01-11 NOTE — Telephone Encounter (Signed)
Per dr Mike Gip, low ferritin is not a side effect of these drugs

## 2016-01-11 NOTE — Telephone Encounter (Signed)
Please advise patient,  Per Dr Allen Norris, he would like patient to only take Lasix 20 mg tablet once daily instead of 2 times daily. He also is requesting to have labs redrawn in a week--CMP.

## 2016-01-11 NOTE — Telephone Encounter (Signed)
Spoke with patients wife. Per Dr.Wohl. Patient is instructed to take 20 mg Lasix daily. Patient was also reminded to have labs drawn in one week. Order is placed at this time and patients wife verbalized understanding.

## 2016-01-11 NOTE — Telephone Encounter (Signed)
Inquiring if Lasix or Aldactone would cause his Ferritin to drop or to give a false reading. States she read something that said it could cause a drop in the ferritin level. Please advise.

## 2016-01-11 NOTE — Telephone Encounter (Signed)
Called patient's wife to inform her that patient's ferritin is 10.  MD recommends IV venofer weekly X 3.  Informed wife that scheduling will be calling to set up those appointment dates/times.  Verbalized understanding.  Called scheduling to have them make appointments and contact patient.

## 2016-01-11 NOTE — Telephone Encounter (Signed)
LVM at this time for patient to call office.

## 2016-01-11 NOTE — Progress Notes (Signed)
[facility] day:  01/11/16  Chief Complaint: Zachary Buck is a 75 y.o. male  with stage II multiple myeloma and iron deficiency anemia who is seen for 1 month assessment.  HPI: The patient was last seen in the medical oncology clinic on 12/14/2015.  At that time, he was feeling much better.  Black stools had returned back to brown stools.  SPEP was stable.  Hematocrit was 28.3 with a hemoglobin of 9.6.  Ferritin was 52.  During the interim, he notes issues with ascites and "swelling in the abdomen".  He eats all of the time.  He has been on Lasix and aldactone.  Aldactone dose has been changed from 50 mg/day to 25 mg/day back to 50 mg/day.  Weight is down.  He cut out fat and salt from his diet.  Diarrhea is better.  He is taking Enterogam 3-4 times a day.   Past Medical History:  Diagnosis Date  . Anemia   . Atherosclerotic heart disease    s/p MI, s/p stent mid circumflex  . CAD (coronary artery disease)    Dr Nehemiah Massed, cardiologist  . Cirrhosis of liver ([place])   . Clavicle fracture   . Diabetes mellitus without complication ([place])    oral med  . Esophageal varices ([place])   . Hepatic encephalopathy ([place])   . Hypercholesterolemia   . Hypertension    controlled on meds  . Internal hemorrhoids   . Leg fracture   . Multiple myeloma ([place])   . Myocardial infarct   . Prostate enlargement   . Tubular adenoma of colon     Past Surgical History:  Procedure Laterality Date  . COLONOSCOPY WITH PROPOFOL N/A 03/04/2015   Procedure: COLONOSCOPY WITH PROPOFOL;  Surgeon: Jerene Bears, MD;  Location: [facility];  Service: Gastroenterology;  Laterality: N/A;  . COLONOSCOPY WITH PROPOFOL N/A 12/09/2015   Procedure: COLONOSCOPY WITH PROPOFOL;  Surgeon: [name], MD;  Location: [facility];  Service: Endoscopy;  Laterality: N/A;  DIABETIC-ORAL MEDS  . CORONARY ANGIOPLASTY WITH STENT PLACEMENT     2 stents  .  ESOPHAGOGASTRODUODENOSCOPY (EGD) WITH PROPOFOL N/A 03/04/2015   Procedure: ESOPHAGOGASTRODUODENOSCOPY (EGD) WITH PROPOFOL;  Surgeon: Jerene Bears, MD;  Location: [facility];  Service: Gastroenterology;  Laterality: N/A;  . ESOPHAGOGASTRODUODENOSCOPY (EGD) WITH PROPOFOL N/A 12/09/2015   Procedure: ESOPHAGOGASTRODUODENOSCOPY (EGD) WITH PROPOFOL;  Surgeon: [name], MD;  Location: [facility];  Service: Endoscopy;  Laterality: N/A;  . ESOPHAGOGASTRODUODENOSCOPY (EGD) WITH PROPOFOL N/A 12/21/2015   Procedure: ESOPHAGOGASTRODUODENOSCOPY (EGD) WITH PROPOFOL;  Surgeon: [name], MD;  Location: [facility];  Service: Endoscopy;  Laterality: N/A;  . POLYPECTOMY N/A 12/09/2015   Procedure: POLYPECTOMY;  Surgeon: [name], MD;  Location: [facility];  Service: Endoscopy;  Laterality: N/A;  . TONSILLECTOMY      Family History  Problem Relation Age of Onset  . Aneurysm Father 44    of the heart  . Heart attack Father   . Diabetes Mother     Social History:  reports that he has never smoked. He has never used smokeless tobacco. He reports that he does not drink alcohol or use drugs.  Patient denies any exposure to radiation or toxins. The patient is accompanied by his wife, [name], today.  Allergies:  Allergies  Allergen Reactions  . Niaspan [Niacin Er] Other (See Comments)    Just does not want to take     Current Medications: Current Outpatient  Prescriptions  Medication Sig Dispense Refill  . Alpha-Lipoic Acid (LIPOIC ACID PO) Take 200 mg by mouth daily.     . Ascorbic Acid (VITAMIN C) 1000 MG tablet Take 1,000 mg by mouth daily.    . B Complex Vitamins (VITAMIN B COMPLEX) TABS Take by mouth daily.    . blood glucose meter kit and supplies KIT Dispense based on patient and insurance preference. Use up to four times daily as directed. (FOR ICD-9 250.00, 250.01). 1 each 0  . blood glucose meter kit and supplies KIT Dispense based on patient and insurance preference.  Use up to four times daily as directed. E11.9 Please dispense Freestyle freedom meter and supplies. 1 each 0  . Cholecalciferol (VITAMIN D-3) 1000 UNITS CAPS Take by mouth.    . CHROMIUM PO Take 600 mg by mouth daily.     . Cinnamon 500 MG capsule Take 500 mg by mouth.    . Coenzyme Q10 100 MG capsule Take by mouth.    . furosemide (LASIX) 20 MG tablet Take 1 tablet (20 mg total) by mouth 2 (two) times daily. 60 tablet 3  . Ginger, Zingiber officinalis, (GINGER ROOT) 550 MG CAPS Take by mouth daily.    Marland Kitchen glipiZIDE (GLUCOTROL) 5 MG tablet TAKE 1 TABLET BY MOUTH EVERY MORNING AND 2 TABLETS EVERY EVENING 270 tablet 2  . glucose blood (BAYER CONTOUR NEXT TEST) test strip Contour Next EZ test strips: check blood sugars twice a day. Dx: 250.00    . glucose blood (ONE TOUCH ULTRA TEST) test strip Use as instructed 100 each 12  . Lancets (ONETOUCH ULTRASOFT) lancets Use as instructed 100 each 12  . lisinopril (PRINIVIL,ZESTRIL) 20 MG tablet TAKE ONE TABLET BY MOUTH EVERY DAY/ AM    . Magnesium 500 MG CAPS Take 500 mg by mouth daily.    . metFORMIN (GLUCOPHAGE) 1000 MG tablet TAKE 1 TABLET(1000 MG) BY MOUTH TWICE DAILY WITH A MEAL 60 tablet 5  . Omega-3 Fatty Acids (FISH OIL) 1000 MG CAPS Take 4,000 mg by mouth daily.     Marland Kitchen OVER THE COUNTER MEDICATION BUPLEURUM    . OVER THE COUNTER MEDICATION 250 MG CITICOLINE    . OVER THE COUNTER MEDICATION     . OVER THE COUNTER MEDICATION     . OVER THE COUNTER MEDICATION     . OVER THE COUNTER MEDICATION     . SBI/Protein Isolate (ENTERAGAM) 5 g PACK Take 1 packet by mouth daily. 30 each 3  . sildenafil (REVATIO) 20 MG tablet 3 to 5 tablets per day or as instructed    . spironolactone (ALDACTONE) 50 MG tablet Take 1 tablet (50 mg total) by mouth daily. 30 tablet 3  . Theanine 100 MG CAPS Take by mouth daily.    Marland Kitchen triamcinolone cream (KENALOG) 0.1 % Apply topically 2 (two) times daily. Please avoid face and genitalia area.  Do not use in the same spot for more  than 7-10 days in a row. 80 g 0  . Turmeric POWD 4,000 mg by Does not apply route daily.    . vitamin B-12 (CYANOCOBALAMIN) 100 MCG tablet Take 200 mcg by mouth daily.     No current facility-administered medications for this visit.    Facility-Administered Medications Ordered in Other Visits  Medication Dose Route Frequency Provider Last Rate Last Dose  . alteplase (CATHFLO ACTIVASE) injection 2 mg  2 mg Intracatheter Once PRN Lequita Asal, MD      . heparin  lock flush 100 unit/mL  500 Units Intracatheter Once PRN Lequita Asal, MD      . heparin lock flush 100 unit/mL  250 Units Intracatheter Once PRN Lequita Asal, MD      . sodium chloride 0.9 % injection 10 mL  10 mL Intracatheter PRN Lequita Asal, MD      . sodium chloride 0.9 % injection 3 mL  3 mL Intravenous Once PRN Lequita Asal, MD        Review of Systems:  GENERAL:  Feels good.  No fevers or sweats.  Weight down 11 pounds. PERFORMANCE STATUS (ECOG):  1 HEENT:  No visual changes, runny nose, sore throat, mouth sores or tenderness. Lungs: Shortness of breath with exertion.  No cough.  No hemoptysis. Cardiac:  No chest pain, palpitations, orthopnea, or PND. GI:  Eating well.  No nausea, vomiting, diarrhea, constipation, melena or hematochezia.  GU:  No urgency, frequency, dysuria, or hematuria. Musculoskeletal:  No back pain.  No joint pain.  No muscle tenderness. Extremities:  No pain or swelling. Skin:  No rashes or skin changes. Neuro:  Short term memory issues.  No headache, numbness or weakness, balance or coordination issues. Endocrine:  Diabetes.  No thyroid issues, hot flashes or night sweats. Psych:  No mood changes, depression or anxiety. Pain:  No focal pain. Review of systems:  All other systems reviewed and found to be negative.  Physical Exam: BP 135/78 (BP Location: Left Arm, Patient Position: Sitting) Comment: Recheck  Pulse 76   Temp (!) 95.8 F (35.4 C) (Tympanic)   Resp 18    Wt 173 lb 4.5 oz (78.6 kg)   BMI 25.59 kg/m   GENERAL:  Well developed, well nourished, gentleman sitting comfortably in the exam room in no acute distress. MENTAL STATUS:  Alert and oriented to person, place and time. HEAD:  Pearline Cables hair.  Normocephalic, atraumatic, face symmetric, no Cushingoid features. EYES:  Glasses.  Blue eyes.  Pupils equal round and reactive to light and accomodation.  No conjunctivitis or scleral icterus. ENT:  Oropharynx clear without lesion.  Tongue normal. Mucous membranes moist.  RESPIRATORY:  Clear to auscultation without rales, wheezes or rhonchi. CARDIOVASCULAR:  Regular rate and rhythm without murmur, rub or gallop. ABDOMEN:  Soft, non-tender, with active bowel sounds, and no appreciable hepatosplenomegaly.  No masses. SKIN:  No rashes, ulcers or lesions. EXTREMITIES: No edema, no skin discoloration or tenderness.  No palpable cords. LYMPH NODES: No palpable cervical, supraclavicular, axillary or inguinal adenopathy  NEUROLOGICAL: Unremarkable. PSYCH:  Appropriate.    Appointment on 01/11/2016  Component Date Value Ref Range Status  . WBC 01/11/2016 5.6  3.8 - 10.6 K/uL Final  . RBC 01/11/2016 3.34* 4.40 - 5.90 MIL/uL Final  . Hemoglobin 01/11/2016 8.8* 13.0 - 18.0 g/dL Final  . HCT 01/11/2016 26.7* 40.0 - 52.0 % Final  . MCV 01/11/2016 80.1  80.0 - 100.0 fL Final  . MCH 01/11/2016 26.5  26.0 - 34.0 pg Final  . MCHC 01/11/2016 33.1  32.0 - 36.0 g/dL Final  . RDW 01/11/2016 18.0* 11.5 - 14.5 % Final  . Platelets 01/11/2016 131* 150 - 440 K/uL Final  . Neutrophils Relative % 01/11/2016 58  % Final  . Neutro Abs 01/11/2016 3.2  1.4 - 6.5 K/uL Final  . Lymphocytes Relative 01/11/2016 22  % Final  . Lymphs Abs 01/11/2016 1.3  1.0 - 3.6 K/uL Final  . Monocytes Relative 01/11/2016 10  % Final  .  Monocytes Absolute 01/11/2016 0.5  0.2 - 1.0 K/uL Final  . Eosinophils Relative 01/11/2016 9  % Final  . Eosinophils Absolute 01/11/2016 0.5  0 - 0.7 K/uL Final   . Basophils Relative 01/11/2016 1  % Final  . Basophils Absolute 01/11/2016 0.1  0 - 0.1 K/uL Final  . Sodium 01/11/2016 134* 135 - 145 mmol/L Final  . Potassium 01/11/2016 5.0  3.5 - 5.1 mmol/L Final  . Chloride 01/11/2016 103  101 - 111 mmol/L Final  . CO2 01/11/2016 24  22 - 32 mmol/L Final  . Glucose, Bld 01/11/2016 166* 65 - 99 mg/dL Final  . BUN 01/11/2016 27* 6 - 20 mg/dL Final  . Creatinine, Ser 01/11/2016 1.45* 0.61 - 1.24 mg/dL Final  . Calcium 01/11/2016 9.5  8.9 - 10.3 mg/dL Final  . Total Protein 01/11/2016 8.5* 6.5 - 8.1 g/dL Final  . Albumin 01/11/2016 3.2* 3.5 - 5.0 g/dL Final  . AST 01/11/2016 25  15 - 41 U/L Final  . ALT 01/11/2016 23  17 - 63 U/L Final  . Alkaline Phosphatase 01/11/2016 66  38 - 126 U/L Final  . Total Bilirubin 01/11/2016 0.9  0.3 - 1.2 mg/dL Final  . GFR calc non Af Amer 01/11/2016 46* >60 mL/min Final  . GFR calc Af Amer 01/11/2016 53* >60 mL/min Final   Comment: (NOTE) The eGFR has been calculated using the CKD EPI equation. This calculation has not been validated in all clinical situations. eGFR's persistently <60 mL/min signify possible Chronic Kidney Disease.   . Anion gap 01/11/2016 7  5 - 15 Final    Assessment:  DEVARIOUS PAVEK is a 75 y.o. male with iron deficiency anemia and stage II multiple myeloma. 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 Serum immunoglobulins noted an IgG of 2930 (high). 24-hour urine revealed a 8.8% monoclonal protein (10.6 mg/24 hours).  Albumen was 3.3 on 08/26/2014. He had pneumonia in 05/2014.   Bone survey on 10/20/2014 revealed no lytic lesions. Bone survey on 09/16/2015 revealed no focal lytic lesion or acute bony abnormality.   SPEP has been stable: 2.3 g/dL on 10/09/2014, 01/18/2015, 03/22/2015, 06/18/2015, 09/13/2015, and 11/08/2015.  SPEP was 2.7 gm/dL on 01/11/2016.  Lambda free light chains were 155.3  (ratio 0.15) on 01/18/2015, 174.59 (ratio of 0.16) on 06/18/2015, 182.9 (ratio of 0.13) on 09/13/2015, 205 (ratio 0.11) on 11/08/2015, and 292.7 on 01/11/2016.  Bone marrow biopsy on 11/10/2014 revealed a 10% monoclonal plasma cell infiltrate. Marrow was hypercellular for age (40-50%) with mixed maturing hematopoiesis, relative erythroid hyperplasia and mild nonspecific dyserythropoiesis. There was patchy mild focally moderate increase in reticulin. There were no significant iron stores. Myeloma FISH panel revealed translocation (11;14) which results in fusion of CCND1 (BCL1) at 11q13 with the immunoglobulin heavy chain gene (IgH) at 14q32 (a favorable prognostic feature).   Beta2 microglobulin was 4.2 on 11/24/2014, 3.8 on 06/18/2015, and 4.9 on 10/18/2015.  PET scan on 12/07/2014 revealed no hypermetabolic foci within the marrow space or nodal stations to suggest active multiple myeloma. There was hypermetabolism within the right lower lobe, corresponding to a similar dependent density. Morphology favored scarring.There was cirrhosis and portal hypertension. There was gastric body hypermetabolism favoring physiologic. Spleen was 15.3 cm (volume 1065 cc).  There was pulmonary artery enlargement suggesting pulmonary artery hypertension. 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  He has a history of asbestos exposure in the [employer].  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  Abdominal CT scan on 03/16/2015 revealed splenomegaly (14.5 cm), cirrhosis, and stigmata of portal hypertension with esophageal and gastric varices.  There were no liver lesions.  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  There were no pleural effusions.  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  There was no frank honeycombing. This finding accounted for the medial right lower lobe hypermetabolism on the 12/07/2014 PET-CT.  There was cirrhosis, splenomegaly, and prominent gastroesophageal varices.  He has had mild anemia since 10/2013 and mild thrombocytopenia since 05/2014. Colonoscopy in 10/2013 revealed polyps. EGD revealed gastritis. Capsule enteroscopy on 11/27/2014 revealed 1-2 gastric polyps and one colon polyp.  There were no findings to explain iron deficiency anemia.  His diet was good. Labs confirmed iron deficiency (ferritin 11.8 on 05/2014 and 13 on 10/09/2014). B12 was low normal (MMA normal) thus ruling out B12 deficiency.  Colonoscopy on 03/04/2015 revealed a sessile polyp was seen in the transverse colon (tubular adenoma). EGD on 03/04/2015 revealed medium-sized esophageal varices in the mid and distal esophagus without bleeding. There was a sessile polyp in the gastric antrum (hyperplastic polyp).  There were 3 small angiodysplastic lesions in the second part of the duodenum.  It was recommended that he continue Protonix.  He is on nadolol.  He was told that the etiology of his cirrhosis was fatty liver.  He has had a capsule study.  He stopped his Protonix secondary to pain in his stomach. He stopped his pancreatic enzymes. He notes diarrhea for a long time (6 months to 2 years).  Diarrhea has improved on Enterogam (began 06/22/2015).  He denies any diarrhea.  He notes ascites and treatment with aldactone and Lasix.  He has recurrent iron deficiency anemia.  He received 200 mg Venofer weekly 3 (03/26/2015 - 04/14/2015), weekly x 3 (06/25/2015 - 07/09/2015), and weekly x 3 (09/17/2015 - 10/01/2015).  Ferritin was 23 on 06/18/2015, 121 on 07/26/2015, 17 on 09/13/2015, 18 on 11/15/2015, 52 on 12/13/2015, and 10 on 01/11/2016.  Symptomatically, he feels good.  He has lost 11 pounds.  Plan: 1.  Labs today:  CBC with diff,  CMP, SPEP, free light chains, ferritin. 2.  RTC in 2 weeks for labs (CBC with diff, ferritin, retic) 3.  RTC in 4 weeks for MD assessment and labs (CBC with diff, CMP, SPEP, FLCA, ferritin).   Lequita Asal, MD  01/11/2016, 11:58 AM

## 2016-01-11 NOTE — Progress Notes (Signed)
Patient has no complaints today.

## 2016-01-12 LAB — PROTEIN ELECTROPHORESIS, SERUM
A/G Ratio: 0.7 (ref 0.7–1.7)
Albumin ELP: 3.3 g/dL (ref 2.9–4.4)
Alpha-1-Globulin: 0.2 g/dL (ref 0.0–0.4)
Alpha-2-Globulin: 0.7 g/dL (ref 0.4–1.0)
Beta Globulin: 1 g/dL (ref 0.7–1.3)
Gamma Globulin: 3 g/dL — ABNORMAL HIGH (ref 0.4–1.8)
Globulin, Total: 4.8 g/dL — ABNORMAL HIGH (ref 2.2–3.9)
M-Spike, %: 2.7 g/dL — ABNORMAL HIGH
Total Protein ELP: 8.1 g/dL (ref 6.0–8.5)

## 2016-01-12 LAB — KAPPA/LAMBDA LIGHT CHAINS
Kappa free light chain: 25.9 mg/L — ABNORMAL HIGH (ref 3.3–19.4)
Kappa, lambda light chain ratio: 0.09 — ABNORMAL LOW (ref 0.26–1.65)
Lambda free light chains: 292.7 mg/L — ABNORMAL HIGH (ref 5.7–26.3)

## 2016-01-17 NOTE — Telephone Encounter (Signed)
Dr. Allen Norris, I spoke with pt's wife and she stated that pt is currently taking Lasix 20mg  daily and spirnolactone 50mg  daily. She is concerned about his heart rate. She says they are staying elevated. 102, 104,118. She said it becomes elevated while dressing. She is wondering if this could be the medication? Please advise.

## 2016-01-18 ENCOUNTER — Ambulatory Visit: Payer: PPO | Admitting: Gastroenterology

## 2016-01-18 ENCOUNTER — Inpatient Hospital Stay: Payer: PPO

## 2016-01-18 VITALS — BP 158/78 | HR 73 | Temp 97.1°F | Resp 18

## 2016-01-18 DIAGNOSIS — C9000 Multiple myeloma not having achieved remission: Secondary | ICD-10-CM | POA: Diagnosis not present

## 2016-01-18 DIAGNOSIS — D500 Iron deficiency anemia secondary to blood loss (chronic): Secondary | ICD-10-CM

## 2016-01-18 MED ORDER — SODIUM CHLORIDE 0.9 % IV SOLN
Freq: Once | INTRAVENOUS | Status: AC
  Administered 2016-01-18: 14:00:00 via INTRAVENOUS
  Filled 2016-01-18: qty 1000

## 2016-01-18 MED ORDER — IRON SUCROSE 20 MG/ML IV SOLN
200.0000 mg | Freq: Once | INTRAVENOUS | Status: AC
  Administered 2016-01-18: 200 mg via INTRAVENOUS
  Filled 2016-01-18: qty 10

## 2016-01-18 NOTE — Telephone Encounter (Signed)
Left vm on wife's cell number with this information. Advised her to call me back tomorrow to discuss the medication change.

## 2016-01-18 NOTE — Telephone Encounter (Signed)
-----   Message from [name], MD sent at 01/16/2016  9:14 AM EST ----- Since the patient potassium has gone up decrease his Aldactone.  Since his lost 11 pounds this should help his ascites and abdominal distention.  He should stay on the Lasix while he decreases his Aldactone  ----- Message ----- From: Luella Cook, RN Sent: 01/11/2016  12:31 PM To: [name], MD  Dr. Allen Norris, Dr. Mike Gip said you wanted to reivew labs on this pt. She states that he has lost 11 pounds since his last visit. The patient states that you put him on lasix and aldactone and his creat is more levated and his potassium is 5. She wondered if changes need to be done on the fluid pills.  After your review any changes if you would call patient.  If you need corcoran she said to test or call her.  Thank you, Mudlogger for [employer]

## 2016-01-20 ENCOUNTER — Other Ambulatory Visit: Payer: Self-pay

## 2016-01-20 DIAGNOSIS — R188 Other ascites: Principal | ICD-10-CM

## 2016-01-20 DIAGNOSIS — K7460 Unspecified cirrhosis of liver: Secondary | ICD-10-CM

## 2016-01-21 ENCOUNTER — Other Ambulatory Visit
Admission: RE | Admit: 2016-01-21 | Discharge: 2016-01-21 | Disposition: A | Discharge status: Home / Self Care | Payer: PPO | Source: Ambulatory Visit | Attending: Gastroenterology | Admitting: Gastroenterology

## 2016-01-21 DIAGNOSIS — K7460 Unspecified cirrhosis of liver: Secondary | ICD-10-CM | POA: Insufficient documentation

## 2016-01-21 DIAGNOSIS — R188 Other ascites: Secondary | ICD-10-CM | POA: Insufficient documentation

## 2016-01-21 LAB — COMPREHENSIVE METABOLIC PANEL
ALK PHOS: 68 U/L (ref 38–126)
ALT: 26 U/L (ref 17–63)
ANION GAP: 6 (ref 5–15)
AST: 29 U/L (ref 15–41)
Albumin: 3.3 g/dL — ABNORMAL LOW (ref 3.5–5.0)
BUN: 30 mg/dL — ABNORMAL HIGH (ref 6–20)
CALCIUM: 10 mg/dL (ref 8.9–10.3)
CHLORIDE: 104 mmol/L (ref 101–111)
CO2: 24 mmol/L (ref 22–32)
Creatinine, Ser: 1.44 mg/dL — ABNORMAL HIGH (ref 0.61–1.24)
GFR calc non Af Amer: 46 mL/min — ABNORMAL LOW (ref >60)
GFR, EST AFRICAN AMERICAN: 53 mL/min — AB (ref >60)
Glucose, Bld: 262 mg/dL — ABNORMAL HIGH (ref 65–99)
Potassium: 4.8 mmol/L (ref 3.5–5.1)
SODIUM: 134 mmol/L — AB (ref 135–145)
Total Bilirubin: 0.8 mg/dL (ref 0.3–1.2)
Total Protein: 8.4 g/dL — ABNORMAL HIGH (ref 6.5–8.1)

## 2016-01-24 ENCOUNTER — Other Ambulatory Visit: Payer: Self-pay

## 2016-01-24 ENCOUNTER — Telehealth: Payer: Self-pay | Admitting: *Deleted

## 2016-01-24 DIAGNOSIS — K7460 Unspecified cirrhosis of liver: Secondary | ICD-10-CM

## 2016-01-24 DIAGNOSIS — R188 Other ascites: Principal | ICD-10-CM

## 2016-01-24 NOTE — Telephone Encounter (Signed)
Called to report that his temp Friday night went up to 102, it went away Saturday morning and has not gone back up, he also had one episode of vomiting without nausea and reports that he had a nose bleed also. I explained to her that fever is not a side effect of iron, but that I would make MD aware of this.

## 2016-01-24 NOTE — Telephone Encounter (Signed)
-----   Message from [name], MD sent at 01/24/2016 10:45 AM EST ----- Let the patient know that his kidney function is stable although slightly elevated. If the patient is continuing to lose weight then we should decrease his diuretics. The patient know that his potassium has gone down slightly which is good.

## 2016-01-24 NOTE — Telephone Encounter (Signed)
Pt notified of lab results. Repeat 1 week.

## 2016-01-24 NOTE — Telephone Encounter (Signed)
Called to speak to wife about his episode from last Friday. I told her that dr Mike Gip does not feel that it was related to the iron. But please keep and eye on it and if it happens again to notify us. She did say her son and herself did have a cold but they were staying away from him as much as possible. He will still need to come in for iron infusion. She is agreeable to the plan. She did state he is still working with md about fluid pills and wt loss.  Also he has labs this week with that doctor.

## 2016-01-25 ENCOUNTER — Other Ambulatory Visit: Payer: Self-pay | Admitting: Hematology and Oncology

## 2016-01-25 ENCOUNTER — Inpatient Hospital Stay: Payer: PPO | Attending: Hematology and Oncology

## 2016-01-25 ENCOUNTER — Telehealth: Payer: Self-pay | Admitting: *Deleted

## 2016-01-25 VITALS — BP 143/73 | HR 80 | Temp 97.2°F | Resp 18 | Wt 172.6 lb

## 2016-01-25 DIAGNOSIS — K648 Other hemorrhoids: Secondary | ICD-10-CM | POA: Insufficient documentation

## 2016-01-25 DIAGNOSIS — K7469 Other cirrhosis of liver: Secondary | ICD-10-CM | POA: Insufficient documentation

## 2016-01-25 DIAGNOSIS — E7800 Pure hypercholesterolemia, unspecified: Secondary | ICD-10-CM | POA: Insufficient documentation

## 2016-01-25 DIAGNOSIS — I8510 Secondary esophageal varices without bleeding: Secondary | ICD-10-CM | POA: Insufficient documentation

## 2016-01-25 DIAGNOSIS — E119 Type 2 diabetes mellitus without complications: Secondary | ICD-10-CM | POA: Insufficient documentation

## 2016-01-25 DIAGNOSIS — K760 Fatty (change of) liver, not elsewhere classified: Secondary | ICD-10-CM | POA: Diagnosis not present

## 2016-01-25 DIAGNOSIS — Z7984 Long term (current) use of oral hypoglycemic drugs: Secondary | ICD-10-CM | POA: Insufficient documentation

## 2016-01-25 DIAGNOSIS — Z86010 Personal history of colonic polyps: Secondary | ICD-10-CM | POA: Diagnosis not present

## 2016-01-25 DIAGNOSIS — C9000 Multiple myeloma not having achieved remission: Secondary | ICD-10-CM | POA: Insufficient documentation

## 2016-01-25 DIAGNOSIS — D500 Iron deficiency anemia secondary to blood loss (chronic): Secondary | ICD-10-CM

## 2016-01-25 DIAGNOSIS — J849 Interstitial pulmonary disease, unspecified: Secondary | ICD-10-CM | POA: Insufficient documentation

## 2016-01-25 DIAGNOSIS — I2510 Atherosclerotic heart disease of native coronary artery without angina pectoris: Secondary | ICD-10-CM | POA: Insufficient documentation

## 2016-01-25 DIAGNOSIS — K766 Portal hypertension: Secondary | ICD-10-CM | POA: Insufficient documentation

## 2016-01-25 DIAGNOSIS — I252 Old myocardial infarction: Secondary | ICD-10-CM | POA: Insufficient documentation

## 2016-01-25 DIAGNOSIS — N400 Enlarged prostate without lower urinary tract symptoms: Secondary | ICD-10-CM | POA: Insufficient documentation

## 2016-01-25 DIAGNOSIS — D696 Thrombocytopenia, unspecified: Secondary | ICD-10-CM | POA: Diagnosis not present

## 2016-01-25 DIAGNOSIS — I10 Essential (primary) hypertension: Secondary | ICD-10-CM | POA: Diagnosis not present

## 2016-01-25 DIAGNOSIS — Z79899 Other long term (current) drug therapy: Secondary | ICD-10-CM | POA: Diagnosis not present

## 2016-01-25 DIAGNOSIS — Z77090 Contact with and (suspected) exposure to asbestos: Secondary | ICD-10-CM | POA: Diagnosis not present

## 2016-01-25 DIAGNOSIS — D509 Iron deficiency anemia, unspecified: Secondary | ICD-10-CM | POA: Insufficient documentation

## 2016-01-25 MED ORDER — SODIUM CHLORIDE 0.9 % IV SOLN
Freq: Once | INTRAVENOUS | Status: AC
  Administered 2016-01-25: 14:00:00 via INTRAVENOUS
  Filled 2016-01-25: qty 1000

## 2016-01-25 MED ORDER — SODIUM CHLORIDE 0.9 % IV SOLN: 200.0000 mg | Freq: Once | INTRAVENOUS | Status: DC

## 2016-01-25 MED ORDER — IRON SUCROSE 20 MG/ML IV SOLN
200.0000 mg | Freq: Once | INTRAVENOUS | Status: AC
  Administered 2016-01-25: 200 mg via INTRAVENOUS
  Filled 2016-01-25: qty 10

## 2016-01-25 NOTE — Telephone Encounter (Signed)
Pt in infusion today and wanted to be weighed current wt-78.3 kg

## 2016-01-26 ENCOUNTER — Telehealth: Payer: Self-pay | Admitting: Gastroenterology

## 2016-01-26 NOTE — Telephone Encounter (Signed)
Per wife, Zachary Buck, patient had iron infusion yesterday, took weight at [facility], and tummy looks a little poochy. You can call her on the cell until 12:30 @ [phone] or at home [phone]

## 2016-01-26 NOTE — Telephone Encounter (Signed)
Spoke with pt's wife, Deloris. Advised her based on the weight from yesterday at the cancer center and weight on 01/11/16, pt's weight currently is stable. Will recheck blood work tomorrow to check kidney function with current medications.

## 2016-01-27 ENCOUNTER — Other Ambulatory Visit
Admission: RE | Admit: 2016-01-27 | Discharge: 2016-01-27 | Disposition: A | Discharge status: Home / Self Care | Payer: PPO | Source: Ambulatory Visit | Attending: Gastroenterology | Admitting: Gastroenterology

## 2016-01-27 DIAGNOSIS — C9000 Multiple myeloma not having achieved remission: Secondary | ICD-10-CM | POA: Insufficient documentation

## 2016-01-27 DIAGNOSIS — D500 Iron deficiency anemia secondary to blood loss (chronic): Secondary | ICD-10-CM | POA: Diagnosis not present

## 2016-01-27 LAB — CBC WITH DIFFERENTIAL/PLATELET
Basophils Absolute: 0 10*3/uL (ref 0–0.1)
Basophils Relative: 1 %
Eosinophils Absolute: 0.3 10*3/uL (ref 0–0.7)
Eosinophils Relative: 5 %
HCT: 28.2 % — ABNORMAL LOW (ref 40.0–52.0)
Hemoglobin: 9.1 g/dL — ABNORMAL LOW (ref 13.0–18.0)
Lymphocytes Relative: 14 %
Lymphs Abs: 0.9 10*3/uL — ABNORMAL LOW (ref 1.0–3.6)
MCH: 25.3 pg — ABNORMAL LOW (ref 26.0–34.0)
MCHC: 32.3 g/dL (ref 32.0–36.0)
MCV: 78.4 fL — ABNORMAL LOW (ref 80.0–100.0)
Monocytes Absolute: 0.6 10*3/uL (ref 0.2–1.0)
Monocytes Relative: 9 %
Neutro Abs: 4.4 10*3/uL (ref 1.4–6.5)
Neutrophils Relative %: 71 %
Platelets: 134 10*3/uL — ABNORMAL LOW (ref 150–440)
RBC: 3.6 MIL/uL — ABNORMAL LOW (ref 4.40–5.90)
RDW: 19.4 % — ABNORMAL HIGH (ref 11.5–14.5)
WBC: 6.2 10*3/uL (ref 3.8–10.6)

## 2016-02-01 ENCOUNTER — Telehealth: Payer: Self-pay | Admitting: *Deleted

## 2016-02-01 ENCOUNTER — Other Ambulatory Visit: Payer: Self-pay | Admitting: Hematology and Oncology

## 2016-02-01 ENCOUNTER — Inpatient Hospital Stay: Payer: PPO

## 2016-02-01 NOTE — Telephone Encounter (Signed)
Wife states that pt had iron infusion 12/5 and then Sat12/9 pt started feeling bad, he is lethargic, slow to move, stumbles at times. 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 He did not have a fever this time but the other symptoms are pretty much the same as 4 days after his last venofer.  I told her that Dr. Mike Gip is not here today and I will take info to Dr. Grayland Ormond.  I checked with finnegan and he states that this is not sx of receiving venofer just as dr Mike Gip had said.  He rec: to hold off on venofer today and see if he gets better and if not he may need to see the PCP.  Wife asked pt and he does not want to come today and if his sx cont. She will call PCP.  They have an appt next week to see Corcoran.

## 2016-02-02 ENCOUNTER — Encounter: Payer: Self-pay | Admitting: Hematology and Oncology

## 2016-02-03 ENCOUNTER — Encounter: Payer: Self-pay | Admitting: Internal Medicine

## 2016-02-03 ENCOUNTER — Other Ambulatory Visit
Admission: RE | Admit: 2016-02-03 | Discharge: 2016-02-03 | Disposition: A | Discharge status: Home / Self Care | Payer: PPO | Source: Ambulatory Visit | Attending: Gastroenterology | Admitting: Gastroenterology

## 2016-02-03 ENCOUNTER — Telehealth: Payer: Self-pay

## 2016-02-03 ENCOUNTER — Other Ambulatory Visit: Payer: Self-pay | Admitting: Internal Medicine

## 2016-02-03 DIAGNOSIS — C9000 Multiple myeloma not having achieved remission: Secondary | ICD-10-CM | POA: Diagnosis not present

## 2016-02-03 DIAGNOSIS — D500 Iron deficiency anemia secondary to blood loss (chronic): Secondary | ICD-10-CM | POA: Insufficient documentation

## 2016-02-03 DIAGNOSIS — R188 Other ascites: Secondary | ICD-10-CM

## 2016-02-03 DIAGNOSIS — K7460 Unspecified cirrhosis of liver: Secondary | ICD-10-CM | POA: Diagnosis not present

## 2016-02-03 LAB — COMPREHENSIVE METABOLIC PANEL
ALBUMIN: 3.4 g/dL — AB (ref 3.5–5.0)
ALT: 26 U/L (ref 17–63)
AST: 34 U/L (ref 15–41)
Alkaline Phosphatase: 65 U/L (ref 38–126)
Anion gap: 9 (ref 5–15)
BUN: 27 mg/dL — AB (ref 6–20)
CHLORIDE: 104 mmol/L (ref 101–111)
CO2: 19 mmol/L — AB (ref 22–32)
CREATININE: 1.72 mg/dL — AB (ref 0.61–1.24)
Calcium: 9.7 mg/dL (ref 8.9–10.3)
GFR calc Af Amer: 43 mL/min — ABNORMAL LOW (ref >60)
GFR calc non Af Amer: 37 mL/min — ABNORMAL LOW (ref >60)
Glucose, Bld: 517 mg/dL (ref 65–99)
Potassium: 4.8 mmol/L (ref 3.5–5.1)
SODIUM: 132 mmol/L — AB (ref 135–145)
Total Bilirubin: 0.8 mg/dL (ref 0.3–1.2)
Total Protein: 8.3 g/dL — ABNORMAL HIGH (ref 6.5–8.1)

## 2016-02-03 LAB — CBC WITH DIFFERENTIAL/PLATELET
Basophils Absolute: 0.1 10*3/uL (ref 0–0.1)
Basophils Relative: 1 %
Eosinophils Absolute: 0.3 10*3/uL (ref 0–0.7)
Eosinophils Relative: 4 %
HCT: 29.5 % — ABNORMAL LOW (ref 40.0–52.0)
Hemoglobin: 9.9 g/dL — ABNORMAL LOW (ref 13.0–18.0)
Lymphocytes Relative: 15 %
Lymphs Abs: 1 10*3/uL (ref 1.0–3.6)
MCH: 26.9 pg (ref 26.0–34.0)
MCHC: 33.5 g/dL (ref 32.0–36.0)
MCV: 80.3 fL (ref 80.0–100.0)
Monocytes Absolute: 0.5 10*3/uL (ref 0.2–1.0)
Monocytes Relative: 8 %
Neutro Abs: 4.9 10*3/uL (ref 1.4–6.5)
Neutrophils Relative %: 72 %
Platelets: 156 10*3/uL (ref 150–440)
RBC: 3.68 MIL/uL — ABNORMAL LOW (ref 4.40–5.90)
RDW: 21 % — ABNORMAL HIGH (ref 11.5–14.5)
WBC: 6.7 10*3/uL (ref 3.8–10.6)

## 2016-02-03 LAB — FERRITIN: Ferritin: 88 ng/mL (ref 24–336)

## 2016-02-03 NOTE — Telephone Encounter (Signed)
Received critical lab results from [place] at [facility] Left vm on both cell and home number.   Dr. Allen Norris has been notified of this critical lab result.

## 2016-02-03 NOTE — Telephone Encounter (Signed)
Pt's wife has been notified of lab results. Pt will try contacting her PCP. Advised her if pt becomes lethargic then to take him to the ER if she isn't able to reach the PCP. She will check his sugars again and if they are still elevated she will go ahead and give him his metformin and call the PCP first thing in the morning.

## 2016-02-04 ENCOUNTER — Encounter: Payer: Self-pay | Admitting: Gynecology

## 2016-02-04 ENCOUNTER — Other Ambulatory Visit: Payer: Self-pay | Admitting: Internal Medicine

## 2016-02-04 ENCOUNTER — Telehealth: Payer: Self-pay | Admitting: Internal Medicine

## 2016-02-04 ENCOUNTER — Ambulatory Visit
Admission: EM | Admit: 2016-02-04 | Discharge: 2016-02-04 | Disposition: A | Discharge status: Home / Self Care | Payer: PPO | Attending: Family Medicine | Admitting: Family Medicine

## 2016-02-04 ENCOUNTER — Telehealth: Payer: Self-pay | Admitting: Gastroenterology

## 2016-02-04 DIAGNOSIS — R739 Hyperglycemia, unspecified: Secondary | ICD-10-CM | POA: Diagnosis not present

## 2016-02-04 LAB — URINALYSIS, COMPLETE (UACMP) WITH MICROSCOPIC
BACTERIA UA: NONE SEEN
Bilirubin Urine: NEGATIVE
GLUCOSE, UA: NEGATIVE mg/dL
KETONES UR: NEGATIVE mg/dL
LEUKOCYTES UA: NEGATIVE
Nitrite: NEGATIVE
PH: 5 (ref 5.0–8.0)
Protein, ur: NEGATIVE mg/dL
RBC / HPF: NONE SEEN RBC/hpf (ref 0–5)
Specific Gravity, Urine: 1.015 (ref 1.005–1.030)
Squamous Epithelial / LPF: NONE SEEN

## 2016-02-04 LAB — GLUCOSE, CAPILLARY: GLUCOSE-CAPILLARY: 205 mg/dL — AB (ref 65–99)

## 2016-02-04 NOTE — Telephone Encounter (Signed)
It appears that his sugar is trending down.  If he is slow and lethargic, needs to be seen today.  May be infection or something more going on causing the sugar to elevate.  Would recommend acute care or [facility] urgent care where can get immediate lab results and then can we can f/u after.  Need to make sure nothing more acute going on.

## 2016-02-04 NOTE — Telephone Encounter (Signed)
Spoke with Dr. Allen Norris regarding pt's sugar levels and fluid pills. Per Dr. Allen Norris, he should continue taking the lasix and spironolactone as directed as this does not effect sugars. Pt's PCP has advised pt to go to the Urgent care as he may a an infections. They are currently at the [facility] urgent care.

## 2016-02-04 NOTE — Telephone Encounter (Signed)
Pt spouse called and stated that pt had some blood work done yesterday and his sugar was 517 at 2:30 , when they got home they took it again it 362 at % pm. Pt took his medication of metformin and glipizide and it went down to 179. Pt took blood sugar this morning at it was 217. Spouse states that pt is slow and lethargic. They are not sure if his medication needs to be changed. Please advise, thank you!  Call pt @ [phone]

## 2016-02-04 NOTE — ED Triage Notes (Signed)
Per patient wife x yesterday Dr. Allen Norris office call yesterday and told him that his BG was 517. He was told to call his doctor office. Patient stated his BG went down to 367 at 5:30pm yesterday and 237 at pm. Patient stated at am this morning BG was 179 and 210 when he woke up. Per patient today 279 this afternoon and 311 at 2:30pm. Patient stated that his doctor office told him to come to the urgent to have blood test done.

## 2016-02-04 NOTE — Telephone Encounter (Signed)
Spoke with patients wife and she is concerned that diabetes medications medication may need to be adjusted.  Please advise call [name] wife at [phone]

## 2016-02-04 NOTE — Telephone Encounter (Signed)
Pts wife sent me a my chart message as well about his blood sugars.  I reviewed the labs.  His creatinine was increased some compared to previous.  I agree with the evaluation at urgent care.  Needs f/u labs.  Need to see how kidney function is doing.  May have to stop the metformin if his kidney function remains decreased.  Let her know I am going to hold on sending in a rx for 90 day metformin until this gets sorted out.

## 2016-02-04 NOTE — Discharge Instructions (Signed)
Continue current medications °Follow up with PCP °

## 2016-02-04 NOTE — Telephone Encounter (Signed)
Patients wife called back and also wanted to know with diabetes should he be taking lasix? Sprinolactose, should he be taking this with diabetes? Concerned blood sugar is going up and down. Patient has cirrhoisis due to fatty liver.

## 2016-02-04 NOTE — ED Provider Notes (Signed)
[facility]    CSN: [id number] Arrival date & time: 02/04/16  1544     History   Chief Complaint Chief Complaint  Patient presents with  . Blood Sugar Problem    HPI Zachary Buck is a 75 y.o. male.   75 yo male presents with a c/o a "possible infection" because his blood sugars have been higher recently. He denies any fevers, chills, cough, chest pains, shortness of breath, dysuria, diarrhea, vomiting. States he feels fine now. Wife states patient has been intermittently fatigued and "not himself".    The history is provided by the patient.    Past Medical History:  Diagnosis Date  . Anemia   . Atherosclerotic heart disease    s/p MI, s/p stent mid circumflex  . CAD (coronary artery disease)    Dr Nehemiah Massed, cardiologist  . Cirrhosis of liver ([place])   . Clavicle fracture   . Diabetes mellitus without complication ([place])    oral med  . Esophageal varices ([place])   . Hepatic encephalopathy ([place])   . Hypercholesterolemia   . Hypertension    controlled on meds  . Internal hemorrhoids   . Leg fracture   . Multiple myeloma ([place])   . Myocardial infarct   . Prostate enlargement   . Tubular adenoma of colon     Patient Active Problem List   Diagnosis Date Noted  . Melena   . 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 Benign neoplasm of ascending colon   . Polyp of sigmoid colon   . 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 Incomplete emptying of bladder 09/01/2015  . 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 IDA (iron deficiency anemia)   . Heme positive stool   . Gastric polyp   . Angiodysplasia of duodenum   . Secondary esophageal varices without bleeding ([place])   . Portal hypertension ([place])   . Iron deficiency anemia 12/10/2014  . Abnormal PET of right lung 12/07/2014  . Multiple myeloma ([place]) 11/10/2014  . 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  Thrombocytopenia ([place]) 10/09/2014  . Awareness of heartbeats 09/18/2014  . Nasal lesion 08/26/2014  . Rash 08/26/2014  . Breathlessness on exertion 06/24/2014  . Combined fat and carbohydrate induced hyperlipemia 06/12/2014  . Benign essential HTN 06/11/2014  . Throat congestion 03/08/2014  . Abdominal pain 10/15/2013  . Arm mass 10/15/2013  . Mononeuropathy of upper extremity 09/27/2013  . Elevated prostate specific antigen (PSA) 08/15/2013  . Diarrhea 07/13/2013  . D (diarrhea) 07/13/2013  . Abnormal chest CT 06/22/2013  . Bilateral arm pain 10/11/2012  . 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 Benign prostatic hyperplasia with urinary obstruction 07/23/2012  . Calculus of kidney 07/11/2012  . Calculi, ureter 07/11/2012  . Hydronephrosis 07/11/2012  . Renal colic 12/17/2534  . Diabetes ([place]) 05/29/2012  . Hypercholesterolemia 05/21/2012  . History of colonic polyps 05/21/2012  . CAD (coronary artery disease) 05/21/2012  . Anemia 05/21/2012  . Essential hypertension, benign 05/21/2012  . Absolute anemia 05/21/2012    Past Surgical History:  Procedure Laterality Date  . COLONOSCOPY WITH PROPOFOL N/A 03/04/2015   Procedure: COLONOSCOPY WITH PROPOFOL;  Surgeon: Jerene Bears, MD;  Location: [facility];  Service: Gastroenterology;  Laterality: N/A;  . COLONOSCOPY WITH PROPOFOL N/A 12/09/2015   Procedure: COLONOSCOPY WITH PROPOFOL;  Surgeon: [name], MD;  Location: [place];  Service: Endoscopy;  Laterality: N/A;  DIABETIC-ORAL MEDS  .  CORONARY ANGIOPLASTY WITH STENT PLACEMENT     2 stents  . ESOPHAGOGASTRODUODENOSCOPY (EGD) WITH PROPOFOL N/A 03/04/2015   Procedure: ESOPHAGOGASTRODUODENOSCOPY (EGD) WITH PROPOFOL;  Surgeon: Jerene Bears, MD;  Location: [facility];  Service: Gastroenterology;  Laterality: N/A;  . ESOPHAGOGASTRODUODENOSCOPY (EGD) WITH PROPOFOL N/A 12/09/2015   Procedure: ESOPHAGOGASTRODUODENOSCOPY (EGD) WITH PROPOFOL;  Surgeon: [name], MD;  Location:  [place];  Service: Endoscopy;  Laterality: N/A;  . ESOPHAGOGASTRODUODENOSCOPY (EGD) WITH PROPOFOL N/A 12/21/2015   Procedure: ESOPHAGOGASTRODUODENOSCOPY (EGD) WITH PROPOFOL;  Surgeon: [name], MD;  Location: [facility];  Service: Endoscopy;  Laterality: N/A;  . POLYPECTOMY N/A 12/09/2015   Procedure: POLYPECTOMY;  Surgeon: [name], MD;  Location: [place];  Service: Endoscopy;  Laterality: N/A;  . TONSILLECTOMY         Home Medications    Prior to Admission medications   Medication Sig Start Date End Date Taking? Authorizing Provider  Alpha-Lipoic Acid (LIPOIC ACID PO) Take 200 mg by mouth daily.    Yes Historical Provider, MD  Ascorbic Acid (VITAMIN C) 1000 MG tablet Take 1,000 mg by mouth daily.   Yes Historical Provider, MD  B Complex Vitamins (VITAMIN B COMPLEX) TABS Take by mouth daily.   Yes Historical Provider, MD  blood glucose meter kit and supplies KIT Dispense based on patient and insurance preference. Use up to four times daily as directed. (FOR ICD-9 250.00, 250.01). 03/16/14  Yes Einar Pheasant, MD  blood glucose meter kit and supplies KIT Dispense based on patient and insurance preference. Use up to four times daily as directed. E11.9 Please dispense Freestyle freedom meter and supplies. 09/09/15  Yes Coral Spikes, DO  Cholecalciferol (VITAMIN D-3) 1000 UNITS CAPS Take by mouth.   Yes Historical Provider, MD  CHROMIUM PO Take 600 mg by mouth daily.    Yes Historical Provider, MD  Cinnamon 500 MG capsule Take 500 mg by mouth.   Yes Historical Provider, MD  Coenzyme Q10 100 MG capsule Take by mouth.   Yes Historical Provider, MD  furosemide (LASIX) 20 MG tablet Take 1 tablet (20 mg total) by mouth 2 (two) times daily. 01/03/16  Yes [name], MD  Ginger, Zingiber officinalis, (GINGER ROOT) 550 MG CAPS Take by mouth daily.   Yes Historical Provider, MD  glipiZIDE (GLUCOTROL) 5 MG tablet TAKE 1 TABLET BY MOUTH EVERY MORNING AND 2 TABLETS EVERY  EVENING 10/18/15  Yes Einar Pheasant, MD  glucose blood (BAYER CONTOUR NEXT TEST) test strip Contour Next EZ test strips: check blood sugars twice a day. Dx: 250.00 05/07/13  Yes Historical Provider, MD  glucose blood (ONE TOUCH ULTRA TEST) test strip Use as instructed 11/18/15  Yes Einar Pheasant, MD  Lancets (ONETOUCH ULTRASOFT) lancets Use as instructed 03/16/14  Yes Einar Pheasant, MD  lisinopril (PRINIVIL,ZESTRIL) 20 MG tablet TAKE ONE TABLET BY MOUTH EVERY DAY/ AM 01/11/15  Yes Historical Provider, MD  Magnesium 500 MG CAPS Take 500 mg by mouth daily.   Yes Historical Provider, MD  metFORMIN (GLUCOPHAGE) 1000 MG tablet TAKE 1 TABLET(1000 MG) BY MOUTH TWICE DAILY WITH A MEAL 08/12/15  Yes Einar Pheasant, MD  metFORMIN (GLUCOPHAGE) 1000 MG tablet TAKE 1 TABLET(1000 MG) BY MOUTH TWICE DAILY WITH A MEAL 02/04/16  Yes Einar Pheasant, MD  Omega-3 Fatty Acids (FISH OIL) 1000 MG CAPS Take 4,000 mg by mouth daily.    Yes Historical Provider, MD  [place]   Yes Historical Provider, MD  OVER THE COUNTER  MEDICATION 250 MG CITICOLINE   Yes Historical Provider, MD  OVER THE COUNTER MEDICATION    Yes Historical Provider, MD  OVER THE COUNTER MEDICATION    Yes Historical Provider, MD  OVER THE COUNTER MEDICATION    Yes Historical Provider, MD  OVER THE COUNTER MEDICATION    Yes Historical Provider, MD  SBI/Protein Isolate (ENTERAGAM) 5 g PACK Take 1 packet by mouth daily. 07/26/15  Yes Lequita Asal, MD  sildenafil (REVATIO) 20 MG tablet 3 to 5 tablets per day or as instructed 09/01/15  Yes Historical Provider, MD  spironolactone (ALDACTONE) 50 MG tablet Take 1 tablet (50 mg total) by mouth daily. 01/03/16  Yes [name], MD  Theanine 100 MG CAPS Take by mouth daily.   Yes Historical Provider, MD  triamcinolone cream (KENALOG) 0.1 % Apply topically 2 (two) times daily. Please avoid face and genitalia area.  Do not use in the same spot for more than 7-10 days in a row. 09/28/15  Yes  Einar Pheasant, MD  Turmeric POWD 4,000 mg by Does not apply route daily.   Yes Historical Provider, MD  vitamin B-12 (CYANOCOBALAMIN) 100 MCG tablet Take 200 mcg by mouth daily.   Yes Historical Provider, MD    Family History Family History  Problem Relation Age of Onset  . Aneurysm Father 52    of the heart  . Heart attack Father   . Diabetes Mother     Social History Social History  Substance Use Topics  . Smoking status: Never Smoker  . Smokeless tobacco: Never Used  . Alcohol use No     Allergies   Niaspan [niacin er]   Review of Systems Review of Systems   Physical Exam Triage Vital Signs ED Triage Vitals  Enc Vitals Group     BP 02/04/16 1729 139/77     Pulse Rate 02/04/16 1729 84     Resp --      Temp 02/04/16 1729 98.3 F (36.8 C)     Temp Source 02/04/16 1729 Oral     SpO2 02/04/16 1729 100 %     Weight 02/04/16 1731 172 lb (78 kg)     Height --      Head Circumference --      Peak Flow --      Pain Score 02/04/16 1734 0     Pain Loc --      Pain Edu? --      Excl. in [place]? --    No data found.   Updated Vital Signs BP 139/77 (BP Location: Left Arm)   Pulse 84   Temp 98.3 F (36.8 C) (Oral)   Wt 172 lb (78 kg)   SpO2 100%   BMI 25.40 kg/m   Visual Acuity Right Eye Distance:   Left Eye Distance:   Bilateral Distance:    Right Eye Near:   Left Eye Near:    Bilateral Near:     Physical Exam  Constitutional: He is oriented to person, place, and time. He appears well-developed and well-nourished. No distress.  HENT:  Head: Normocephalic and atraumatic.  Right Ear: Tympanic membrane, external ear and ear canal normal.  Left Ear: Tympanic membrane, external ear and ear canal normal.  Nose: Nose normal.  Mouth/Throat: Uvula is midline, oropharynx is clear and moist and mucous membranes are normal. No oropharyngeal exudate or tonsillar abscesses.  Eyes: Conjunctivae and EOM are normal. Pupils are equal, round, and reactive to light.  Right eye exhibits  no discharge. Left eye exhibits no discharge. No scleral icterus.  Neck: Normal range of motion. Neck supple. No tracheal deviation present. No thyromegaly present.  Cardiovascular: Normal rate, regular rhythm and normal heart sounds.   Pulmonary/Chest: Effort normal and breath sounds normal. No stridor. No respiratory distress. He has no wheezes. He has no rales. He exhibits no tenderness.  Lymphadenopathy:    He has no cervical adenopathy.  Neurological: He is alert and oriented to person, place, and time. He displays normal reflexes. No cranial nerve deficit or sensory deficit. He exhibits normal muscle tone. Coordination normal.  Skin: Skin is warm and dry. No rash noted. He is not diaphoretic.  Nursing note and vitals reviewed.    UC Treatments / Results  Labs (all labs ordered are listed, but only abnormal results are displayed) Labs Reviewed  GLUCOSE, CAPILLARY - Abnormal; Notable for the following:       Result Value   Glucose-Capillary 205 (*)    All other components within normal limits  URINALYSIS, COMPLETE (UACMP) WITH MICROSCOPIC - Abnormal; Notable for the following:    Hgb urine dipstick TRACE (*)    All other components within normal limits  CBG MONITORING, ED    EKG  EKG Interpretation None       Radiology No results found.  Procedures Procedures (including critical care time)  Medications Ordered in UC Medications - No data to display   Initial Impression / Assessment and Plan / UC Course  I have reviewed the triage vital signs and the nursing notes.  Pertinent labs & imaging results that were available during my care of the patient were reviewed by me and considered in my medical decision making (see chart for details).  Clinical Course       Final Clinical Impressions(s) / UC Diagnoses   Final diagnoses:  Hyperglycemia    New Prescriptions Discharge Medication List as of 02/04/2016  6:49 PM     1. Lab (UA negative;  WBC normal yesterday) results and diagnosis reviewed with patient 2. Recommend supportive treatment with close monitoring for any new developing symptoms; more frequent monitoring of blood sugars 4. Follow up with pcp next week 5. Follow-up prn if symptoms worsen or don't improve   Norval Gable, MD 02/04/16 2054

## 2016-02-04 NOTE — Telephone Encounter (Signed)
Spoke with [name] wife  states patient was alert and aware.  Patient is performing normal daily activities now.  Checked Blood sugar at lunch was 311.  She is going to take him [place] urgent care evaluated to be seen this afternoon.

## 2016-02-04 NOTE — Telephone Encounter (Signed)
Noted.  See my chart message. 

## 2016-02-04 NOTE — Telephone Encounter (Signed)
Patient of Dr. Allen Norris. Mrs Damp called to let us know that per patients PCP go to the Urgent Care. He might have an infection somewhere. She wanted someone to call her today to see if his medication needs to be changed. Was his blood results ok from yesterday? His blood was 311 today. Ginger has been talking to her but I'm not sure Ginger will be in today and she needs to speak to someone. [phone]

## 2016-02-05 MED ORDER — TRIAMCINOLONE ACETONIDE 0.1 % EX CREA: TOPICAL_CREAM | Freq: Two times a day (BID) | CUTANEOUS | 0 refills | Status: DC

## 2016-02-05 NOTE — Telephone Encounter (Signed)
rx ok'd for triamcinolone cream.

## 2016-02-07 ENCOUNTER — Telehealth: Payer: Self-pay | Admitting: Internal Medicine

## 2016-02-07 NOTE — Telephone Encounter (Signed)
Pt wife called and stated that they went to Urgent care as advised by Dr. Nicki Reaper. They wanted them to follow up with Dr. Nicki Reaper. Please advise, thank you!  Call pt @ [phone]

## 2016-02-07 NOTE — Telephone Encounter (Signed)
Spoke with patients wife and she states they went to urgent care and they did urine it was fine, lungs were good , BP was good, no blood work  was done. Blood sugar was checked it was 205. Yesterday patient was dizzy and off balance.   FBS today 205 and Blood sugar 230 patient has diarrhea today.  Wife requesting an appointment to come in to be evaluated.   Please advise.

## 2016-02-08 ENCOUNTER — Telehealth: Payer: Self-pay | Admitting: Gastroenterology

## 2016-02-08 ENCOUNTER — Telehealth: Payer: Self-pay | Admitting: *Deleted

## 2016-02-08 ENCOUNTER — Other Ambulatory Visit: Payer: Self-pay | Admitting: *Deleted

## 2016-02-08 ENCOUNTER — Other Ambulatory Visit: Payer: PPO

## 2016-02-08 ENCOUNTER — Inpatient Hospital Stay: Payer: PPO | Admitting: *Deleted

## 2016-02-08 DIAGNOSIS — C9000 Multiple myeloma not having achieved remission: Secondary | ICD-10-CM | POA: Diagnosis not present

## 2016-02-08 DIAGNOSIS — D649 Anemia, unspecified: Secondary | ICD-10-CM

## 2016-02-08 DIAGNOSIS — D508 Other iron deficiency anemias: Secondary | ICD-10-CM

## 2016-02-08 LAB — CBC WITH DIFFERENTIAL/PLATELET
Basophils Absolute: 0.1 10*3/uL (ref 0–0.1)
Basophils Relative: 1 %
Eosinophils Absolute: 0.4 10*3/uL (ref 0–0.7)
Eosinophils Relative: 8 %
HCT: 29.4 % — ABNORMAL LOW (ref 40.0–52.0)
Hemoglobin: 9.8 g/dL — ABNORMAL LOW (ref 13.0–18.0)
Lymphocytes Relative: 19 %
Lymphs Abs: 1 10*3/uL (ref 1.0–3.6)
MCH: 26.1 pg (ref 26.0–34.0)
MCHC: 33.3 g/dL (ref 32.0–36.0)
MCV: 78.4 fL — ABNORMAL LOW (ref 80.0–100.0)
Monocytes Absolute: 0.7 10*3/uL (ref 0.2–1.0)
Monocytes Relative: 13 %
Neutro Abs: 3 10*3/uL (ref 1.4–6.5)
Neutrophils Relative %: 59 %
Platelets: 125 10*3/uL — ABNORMAL LOW (ref 150–440)
RBC: 3.74 MIL/uL — ABNORMAL LOW (ref 4.40–5.90)
RDW: 20.7 % — ABNORMAL HIGH (ref 11.5–14.5)
WBC: 5.1 10*3/uL (ref 3.8–10.6)

## 2016-02-08 LAB — COMPREHENSIVE METABOLIC PANEL
ALT: 21 U/L (ref 17–63)
AST: 23 U/L (ref 15–41)
Albumin: 3.4 g/dL — ABNORMAL LOW (ref 3.5–5.0)
Alkaline Phosphatase: 57 U/L (ref 38–126)
Anion gap: 6 (ref 5–15)
BUN: 44 mg/dL — ABNORMAL HIGH (ref 6–20)
CO2: 24 mmol/L (ref 22–32)
Calcium: 10 mg/dL (ref 8.9–10.3)
Chloride: 102 mmol/L (ref 101–111)
Creatinine, Ser: 1.53 mg/dL — ABNORMAL HIGH (ref 0.61–1.24)
GFR calc Af Amer: 50 mL/min — ABNORMAL LOW (ref >60)
GFR calc non Af Amer: 43 mL/min — ABNORMAL LOW (ref >60)
Glucose, Bld: 252 mg/dL — ABNORMAL HIGH (ref 65–99)
Potassium: 4.6 mmol/L (ref 3.5–5.1)
Sodium: 132 mmol/L — ABNORMAL LOW (ref 135–145)
Total Bilirubin: 1 mg/dL (ref 0.3–1.2)
Total Protein: 8.6 g/dL — ABNORMAL HIGH (ref 6.5–8.1)

## 2016-02-08 NOTE — Telephone Encounter (Signed)
Left message for [name] wife to call.

## 2016-02-08 NOTE — Telephone Encounter (Signed)
Left message to call.

## 2016-02-08 NOTE — Telephone Encounter (Signed)
Pt wife requested a call in reference to pt's afternoon lab  Contact [phone]

## 2016-02-08 NOTE — Telephone Encounter (Signed)
Please call and notify that Zachary Buck will be calling him with an appt.  I would like to see if he can come in for a blood test prior to appt (so that I have results when I see him).  I would like to recheck his kidney function.  See if he can come in 02/08/16 or 02/09/16 for appt. This is a non fasting lab.  Also, have him to continue to monitor his sugar.  Need last few readings.

## 2016-02-08 NOTE — Telephone Encounter (Signed)
Patient appointment scheduled for 02/10/16@ 4:00pm patients wife advised.

## 2016-02-08 NOTE — Telephone Encounter (Signed)
Spoke with [name] spouse advised of below.

## 2016-02-08 NOTE — Telephone Encounter (Signed)
Spoke with Zachary Buck advised of below she verbalized an understanding.  Appointment scheduled for labs.  Patient is having labs drawn at [facility] by Dr Mike Gip today .  They will let us know if kidney function is included in labs ordered.

## 2016-02-08 NOTE — Telephone Encounter (Signed)
He is scheduled for an appt on Thursday 02/10/16 at 4:00.  Please notify pt and confirm doing ok today.

## 2016-02-08 NOTE — Telephone Encounter (Signed)
Returned wifes call about wanting to speak with Dr. Mike Gip about Venofer and husbands appointments. Dr. Mike Gip wanted to change appt to see pt, wife informed of new appt date and time voiced understanding.

## 2016-02-08 NOTE — Telephone Encounter (Signed)
Wife called and given appts to see MD and discuss labs and possible treatments on 02-17-16 voiced understanding.

## 2016-02-08 NOTE — Telephone Encounter (Signed)
Patient's wife called to state that patient was willing to have his last Venofer infusion if indicated. She wanted to make sure the team understood that he was not refusing all treatment. He refused the last scheduled infusion due to clinical condition.

## 2016-02-08 NOTE — Telephone Encounter (Signed)
Spoke with [name] wife.  FBS this am was 230. No dizziness or diarrhea.  He has actually gone hunting now.   He is feeling better today.

## 2016-02-08 NOTE — Telephone Encounter (Signed)
Please call patient regarding blood work. Lab told patient that he was having to many of the same test. Please call today.

## 2016-02-09 ENCOUNTER — Telehealth: Payer: Self-pay

## 2016-02-09 NOTE — Telephone Encounter (Signed)
-----   Message from [name], MD sent at 02/03/2016  6:01 PM EST ----- Patient needs to contact PCP asap and decrease the lasix.

## 2016-02-09 NOTE — Telephone Encounter (Signed)
Advised pt's wife since Dr. Mike Gip is also ordering labs for pt, we are okay not ordering CMP again. She stated she is ordering labs twice a month. Informed her we were only ordering these due to changes that were being made to his fluid pills.

## 2016-02-09 NOTE — Telephone Encounter (Signed)
Advised pt to contact PCP regarding lab results.

## 2016-02-10 ENCOUNTER — Encounter: Payer: Self-pay | Admitting: Internal Medicine

## 2016-02-10 ENCOUNTER — Ambulatory Visit (INDEPENDENT_AMBULATORY_CARE_PROVIDER_SITE_OTHER): Payer: PPO | Admitting: Internal Medicine

## 2016-02-10 DIAGNOSIS — E119 Type 2 diabetes mellitus without complications: Secondary | ICD-10-CM

## 2016-02-10 DIAGNOSIS — C9000 Multiple myeloma not having achieved remission: Secondary | ICD-10-CM

## 2016-02-10 DIAGNOSIS — D696 Thrombocytopenia, unspecified: Secondary | ICD-10-CM

## 2016-02-10 DIAGNOSIS — I8510 Secondary esophageal varices without bleeding: Secondary | ICD-10-CM

## 2016-02-10 DIAGNOSIS — R42 Dizziness and giddiness: Secondary | ICD-10-CM

## 2016-02-10 DIAGNOSIS — K7469 Other cirrhosis of liver: Secondary | ICD-10-CM

## 2016-02-10 DIAGNOSIS — I2510 Atherosclerotic heart disease of native coronary artery without angina pectoris: Secondary | ICD-10-CM

## 2016-02-10 DIAGNOSIS — K766 Portal hypertension: Secondary | ICD-10-CM

## 2016-02-10 DIAGNOSIS — E7800 Pure hypercholesterolemia, unspecified: Secondary | ICD-10-CM

## 2016-02-10 DIAGNOSIS — D500 Iron deficiency anemia secondary to blood loss (chronic): Secondary | ICD-10-CM

## 2016-02-10 DIAGNOSIS — I10 Essential (primary) hypertension: Secondary | ICD-10-CM

## 2016-02-10 NOTE — Progress Notes (Signed)
Pre visit review using our clinic review tool, if applicable. No additional management support is needed unless otherwise documented below in the visit note. 

## 2016-02-10 NOTE — Progress Notes (Signed)
Patient ID: Zachary Buck, male   DOB: [birth year], 75 y.o.   MRN: [id number]   Subjective:    Patient ID: Zachary Buck, male    DOB: [birth year], 75 y.o.   MRN: [id number]  HPI  Patient here as a work in with concerns regarding elevated blood sugar and dizziness.  He has had a complicated medical history.  Is followed by cardiology for his known CAD.  Felt to be stable.  He is followed by Dr Mike Gip for multiple myeloma and anemia.  Has been receiving iron infusions.  Stable monoclonal protein.  Persistent problems with declining hgb.  Seeing Dr Allen Norris now.  Had recent EGD and colonoscopy (12/09/15).  Colonoscopy revealed one polyp in the ascending colon and two polyps in the sigmoid colon and internal hemorrhoids.  EGD with grade II esophageal varices, single gastric polyp and single bleeding angioectasia in the duodenum.  Treated with bipolar cautery and clips placed.  F/u EGD 12/21/15 - grade III esophageal varices with two gastric polyps and one duodenal polyp - clips placed.  Was also started on lasix and aldactone for increasing ascites.  He has had intermittent episodes where he will feel dizzy and per his wife - seems a little more confused.  Labs recently revealed sugar (on one check to be > 500).  Increased creatinine.  He was evaluated at Urgent Care.  They rechecked sugar - improved.  Check urine to confirm no infection.  F/u labs through hematology revealed creatinine back to 1.53 and blood sugar 250.  He has not been checking his sugars regularly.  Is eating.  Has lost weight.  No chest pain.  Breathing overall stable.  No blood in his stool.  Feels weak.     Past Medical History:  Diagnosis Date  . Anemia   . Atherosclerotic heart disease    s/p MI, s/p stent mid circumflex  . CAD (coronary artery disease)    Dr Nehemiah Massed, cardiologist  . Cirrhosis of liver ([place])   . Clavicle fracture   . Diabetes mellitus without complication ([place])    oral med  . Esophageal  varices ([place])   . Hepatic encephalopathy ([place])   . Hypercholesterolemia   . Hypertension    controlled on meds  . Internal hemorrhoids   . Leg fracture   . Multiple myeloma ([place])   . Myocardial infarct   . Prostate enlargement   . Tubular adenoma of colon    Past Surgical History:  Procedure Laterality Date  . COLONOSCOPY WITH PROPOFOL N/A 03/04/2015   Procedure: COLONOSCOPY WITH PROPOFOL;  Surgeon: Jerene Bears, MD;  Location: [facility];  Service: Gastroenterology;  Laterality: N/A;  . COLONOSCOPY WITH PROPOFOL N/A 12/09/2015   Procedure: COLONOSCOPY WITH PROPOFOL;  Surgeon: [name], MD;  Location: [facility];  Service: Endoscopy;  Laterality: N/A;  DIABETIC-ORAL MEDS  . CORONARY ANGIOPLASTY WITH STENT PLACEMENT     2 stents  . ESOPHAGOGASTRODUODENOSCOPY (EGD) WITH PROPOFOL N/A 03/04/2015   Procedure: ESOPHAGOGASTRODUODENOSCOPY (EGD) WITH PROPOFOL;  Surgeon: Jerene Bears, MD;  Location: [facility];  Service: Gastroenterology;  Laterality: N/A;  . ESOPHAGOGASTRODUODENOSCOPY (EGD) WITH PROPOFOL N/A 12/09/2015   Procedure: ESOPHAGOGASTRODUODENOSCOPY (EGD) WITH PROPOFOL;  Surgeon: [name], MD;  Location: [facility];  Service: Endoscopy;  Laterality: N/A;  . ESOPHAGOGASTRODUODENOSCOPY (EGD) WITH PROPOFOL N/A 12/21/2015   Procedure: ESOPHAGOGASTRODUODENOSCOPY (EGD) WITH PROPOFOL;  Surgeon: [name], MD;  Location: [facility];  Service: Endoscopy;  Laterality: N/A;  . POLYPECTOMY N/A 12/09/2015  Procedure: POLYPECTOMY;  Surgeon: [name], MD;  Location: [facility];  Service: Endoscopy;  Laterality: N/A;  . TONSILLECTOMY     Family History  Problem Relation Age of Onset  . Aneurysm Father 7    of the heart  . Heart attack Father   . Diabetes Mother    Social History   Social History  . Marital status: Married    Spouse name: N/A  . Number of children: 2  . Years of education: N/A   Occupational History  . Estate manager/land agent    Social History Main Topics  . Smoking status: Never Smoker  . Smokeless tobacco: Never Used  . Alcohol use No  . Drug use: No  . Sexual activity: Not Asked   Other Topics Concern  . None   Social History Narrative  . None    Outpatient Encounter Prescriptions as of 02/10/2016  Medication Sig  . Alpha-Lipoic Acid (LIPOIC ACID PO) Take 200 mg by mouth daily.   . Ascorbic Acid (VITAMIN C) 1000 MG tablet Take 1,000 mg by mouth daily.  . B Complex Vitamins (VITAMIN B COMPLEX) TABS Take by mouth daily.  . blood glucose meter kit and supplies KIT Dispense based on patient and insurance preference. Use up to four times daily as directed. (FOR ICD-9 250.00, 250.01).  . blood glucose meter kit and supplies KIT Dispense based on patient and insurance preference. Use up to four times daily as directed. E11.9 Please dispense Freestyle freedom meter and supplies.  . Cholecalciferol (VITAMIN D-3) 1000 UNITS CAPS Take by mouth.  . CHROMIUM PO Take 600 mg by mouth daily.   . Cinnamon 500 MG capsule Take 500 mg by mouth.  . Coenzyme Q10 100 MG capsule Take by mouth.  . furosemide (LASIX) 20 MG tablet Take 1 tablet (20 mg total) by mouth 2 (two) times daily.  . Ginger, Zingiber officinalis, (GINGER ROOT) 550 MG CAPS Take by mouth daily.  Marland Kitchen glipiZIDE (GLUCOTROL) 5 MG tablet TAKE 1 TABLET BY MOUTH EVERY MORNING AND 2 TABLETS EVERY EVENING  . glucose blood (BAYER CONTOUR NEXT TEST) test strip Contour Next EZ test strips: check blood sugars twice a day. Dx: 250.00  . glucose blood (ONE TOUCH ULTRA TEST) test strip Use as instructed  . Lancets (ONETOUCH ULTRASOFT) lancets Use as instructed  . lisinopril (PRINIVIL,ZESTRIL) 20 MG tablet TAKE ONE TABLET BY MOUTH EVERY DAY/ AM  . Magnesium 500 MG CAPS Take 500 mg by mouth daily.  . metFORMIN (GLUCOPHAGE) 1000 MG tablet TAKE 1 TABLET(1000 MG) BY MOUTH TWICE DAILY WITH A MEAL  . metFORMIN (GLUCOPHAGE) 1000 MG tablet TAKE 1 TABLET(1000 MG) BY MOUTH TWICE  DAILY WITH A MEAL  . Omega-3 Fatty Acids (FISH OIL) 1000 MG CAPS Take 4,000 mg by mouth daily.   Marland Kitchen OVER THE COUNTER MEDICATION BUPLEURUM  . OVER THE COUNTER MEDICATION 250 MG CITICOLINE  . OVER THE COUNTER MEDICATION   . OVER THE COUNTER MEDICATION   . OVER THE COUNTER MEDICATION   . OVER THE COUNTER MEDICATION   . SBI/Protein Isolate (ENTERAGAM) 5 g PACK Take 1 packet by mouth daily.  . sildenafil (REVATIO) 20 MG tablet 3 to 5 tablets per day or as instructed  . spironolactone (ALDACTONE) 50 MG tablet Take 1 tablet (50 mg total) by mouth daily.  . Theanine 100 MG CAPS Take by mouth daily.  Marland Kitchen triamcinolone cream (KENALOG) 0.1 % Apply topically 2 (two) times daily. Please avoid face and genitalia area.  Do not use in the same spot for more than 7-10 days in a row.  . Turmeric POWD 4,000 mg by Does not apply route daily.  . vitamin B-12 (CYANOCOBALAMIN) 100 MCG tablet Take 200 mcg by mouth daily.   Facility-Administered Encounter Medications as of 02/10/2016  Medication  . alteplase (CATHFLO ACTIVASE) injection 2 mg  . heparin lock flush 100 unit/mL  . heparin lock flush 100 unit/mL  . sodium chloride 0.9 % injection 10 mL  . sodium chloride 0.9 % injection 3 mL    Review of Systems  Constitutional: Positive for fatigue.       Is eating.  Some weight loss.    HENT: Negative for congestion and sinus pressure.   Respiratory: Negative for cough, chest tightness and wheezing.   Cardiovascular: Negative for chest pain, palpitations and leg swelling.  Gastrointestinal: Positive for abdominal pain. Negative for nausea and vomiting.  Genitourinary: Negative for difficulty urinating and dysuria.  Musculoskeletal: Negative for joint swelling and myalgias.  Skin: Negative for color change and rash.  Neurological: Positive for dizziness. Negative for headaches.  Psychiatric/Behavioral: Negative for agitation and dysphoric mood.       Objective:     Not orthostatic on exam.  (blood  pressure systolic readings in the 478G - both lying and standing).    Physical Exam  Constitutional: He appears well-developed and well-nourished. No distress.  HENT:  Nose: Nose normal.  Mouth/Throat: Oropharynx is clear and moist.  Neck: Neck supple.  Cardiovascular: Normal rate and regular rhythm.   Pulmonary/Chest: Effort normal and breath sounds normal. No respiratory distress.  Abdominal: Soft. Bowel sounds are normal. There is tenderness.  Musculoskeletal: He exhibits no edema or tenderness.  Lymphadenopathy:    He has no cervical adenopathy.  Skin: No rash noted. No erythema.  Psychiatric: He has a normal mood and affect. His behavior is normal.    BP (!) 142/78   Pulse 100   Temp 97.6 F (36.4 C) (Oral)   Ht 5' 9"  (1.753 m)   Wt 170 lb 12.8 oz (77.5 kg)   SpO2 96%   BMI 25.22 kg/m  Wt Readings from Last 3 Encounters:  02/10/16 170 lb 12.8 oz (77.5 kg)  02/04/16 172 lb (78 kg)  01/25/16 172 lb 9.9 oz (78.3 kg)     Lab Results  Component Value Date   WBC 5.1 02/08/2016   HGB 9.8 (L) 02/08/2016   HCT 29.4 (L) 02/08/2016   PLT 125 (L) 02/08/2016   GLUCOSE 252 (H) 02/08/2016   CHOL 154 12/17/2015   TRIG 266.0 (H) 12/17/2015   HDL 25.80 (L) 12/17/2015   LDLDIRECT 56.0 12/17/2015   LDLCALC 51 05/14/2013   ALT 21 02/08/2016   AST 23 02/08/2016   NA 132 (L) 02/08/2016   K 4.6 02/08/2016   CL 102 02/08/2016   CREATININE 1.53 (H) 02/08/2016   BUN 44 (H) 02/08/2016   CO2 24 02/08/2016   TSH 6.49 (H) 12/17/2015   INR 1.1 (H) 08/25/2015   HGBA1C 6.8 (H) 12/17/2015   MICROALBUR 22.4 (H) 04/29/2015       Assessment & Plan:   Problem List Items Addressed This Visit    CAD (coronary artery disease)    Followed by cardiology.  Stable.       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  Has ascites.  On lasix and aldactone.  Adjust dose and outlined.  Follow renal function.       Diabetes ([place])  Sugars elevated recently.  Improved now.  Had one  isolated reading in the 500s.  Repeat sugar was 250.  He is not checking his sugars regularly now, but states when he was checking his sugar - they were in the 200 range.  He is eating.  Feels better, just weak.  Discussed the need to change metformin if his renal function decreases more.  We discussed the possibility of starting insulin.  Follow sugars.       Dizziness    Will have him decrease lasix to 79m once a day instead for 428mper day.  May need to decrease more or may need to decrease aldacone.  He is due to have labs checked next week through oncology.  Will recheck renal function then.  Last check back to about baseline.  Discussed further w/up with him, including head scan, etc - given confusion and intermittent dizziness.  He is not interested in further testing.  Wants to adjust the medication and see how he feels.        Essential hypertension, benign    Blood pressure under good control.  Continue same medication regimen except for decrease in lasix as outlined.   Follow pressures.  Follow metabolic panel.        Hypercholesterolemia    Not taking statin medication since issues with liver.  Follow lipid panel.       Iron deficiency anemia    Seeing hematology.  Has had extensive GI w/up.  Receiving IV iron infusions.  Last hgb 9.5.        [place] myel[place]   Followed by hematology.  Stable.       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  Was on nadolol.  Off now.  Being followed by GI.  On aldactone and lasix for ascites.  Increased creatinine.  States has not felt as well being since starting these medications.  Decreased lasix.  States is taking twice a day.  Decreased to once a day.  If no improvement, may need to decreased further or decrease aldactone.        Secondary esophageal varices without bleeding ([facility])    Followed by GI - Dr WoAllen Norris Off nadolol.        [place]rombocytopenia ([place]   Followed by hematology.  Most  recent platelet count 125.  Follow.            SCEinar PheasantMD

## 2016-02-11 ENCOUNTER — Encounter: Payer: Self-pay | Admitting: Internal Medicine

## 2016-02-11 ENCOUNTER — Telehealth: Payer: Self-pay | Admitting: Internal Medicine

## 2016-02-11 NOTE — Telephone Encounter (Signed)
LM for patient to return call.

## 2016-02-11 NOTE — Telephone Encounter (Signed)
Pt called returning your call. Call wife @ [phone]. Thank you!

## 2016-02-11 NOTE — Telephone Encounter (Signed)
Pt spouse called and left a vm stating that they would like a call in regards to a medication clarification. Please advise, thank you!  Call pt @ [phone]

## 2016-02-15 DIAGNOSIS — R42 Dizziness and giddiness: Secondary | ICD-10-CM | POA: Insufficient documentation

## 2016-02-15 NOTE — Telephone Encounter (Signed)
Please call pt and pts wife.  I am not sure why we are just seeing this message.  It appears that she called and sent my chart message on 02/11/16 regarding her concerns and Mr Humphres's blood sugar.  I never saw a phone call from Friday and did not receive a my chart message.  Need to see how he is doing today and how he did over the weekend.  Also, please let them know that I am not in the office.  See how we can help

## 2016-02-15 NOTE — Telephone Encounter (Signed)
Please call and notify her that after my discussion and examination with him this past week, I decreased his lasix to 20mg  per day.  We decided to leave his other medication as he was taking for now and see if decreasing the lasix improved his symptoms.  He was to have labs repeated through oncology (scheduled for 02/17/16 I think).  I was going to see how his renal function was doing and if symptoms were improved and then see if needed to decrease medication further.  He desired no further w/up at that time and wanted to see symptoms improved with adjustment in medication first.

## 2016-02-15 NOTE — Telephone Encounter (Signed)
Spoke with patients wife. Please see My chart message

## 2016-02-15 NOTE — Assessment & Plan Note (Addendum)
Will have him decrease lasix to 20mg  once a day instead for 40mg  per day.  May need to decrease more or may need to decrease aldacone.  He is due to have labs checked next week through oncology.  Will recheck renal function then.  Last check back to about baseline.  Discussed further w/up with him, including head scan, etc - given confusion and intermittent dizziness.  He is not interested in further testing.  Wants to adjust the medication and see how he feels.

## 2016-02-15 NOTE — Assessment & Plan Note (Addendum)
Sugars elevated recently.  Improved now.  Had one isolated reading in the 500s.  Repeat sugar was 250.  He is not checking his sugars regularly now, but states when he was checking his sugar - they were in the 200 range.  He is eating.  Feels better, just weak.  Discussed the need to change metformin if his renal function decreases more.  We discussed the possibility of starting insulin.  Follow sugars.

## 2016-02-16 ENCOUNTER — Encounter: Payer: Self-pay | Admitting: Internal Medicine

## 2016-02-16 ENCOUNTER — Encounter: Payer: Self-pay | Admitting: *Deleted

## 2016-02-16 NOTE — Telephone Encounter (Signed)
I can work him in at 12:15 on Thursday 02/17/16.  May need to be taught how to give himself insulin.  Will need to make sure someone is available that can teach him how to do this if he does not know how.  Let me know if this is a problem.

## 2016-02-16 NOTE — Assessment & Plan Note (Signed)
Followed by cardiology. Stable.   

## 2016-02-16 NOTE — Assessment & Plan Note (Signed)
Has cryptogenic cirrhosis with portal hypertension.  Was on nadolol.  Off now.  Being followed by GI.  On aldactone and lasix for ascites.  Increased creatinine.  States has not felt as well being since starting these medications.  Decreased lasix.  States is taking twice a day.  Decreased to once a day.  If no improvement, may need to decreased further or decrease aldactone.

## 2016-02-16 NOTE — Assessment & Plan Note (Signed)
Followed by GI.  Has ascites.  On lasix and aldactone.  Adjust dose and outlined.  Follow renal function.

## 2016-02-16 NOTE — Assessment & Plan Note (Signed)
Followed by hematology. Stable.  

## 2016-02-16 NOTE — Assessment & Plan Note (Signed)
Not taking statin medication since issues with liver.  Follow lipid panel.

## 2016-02-16 NOTE — Assessment & Plan Note (Signed)
Blood pressure under good control.  Continue same medication regimen except for decrease in lasix as outlined.   Follow pressures.  Follow metabolic panel.

## 2016-02-16 NOTE — Telephone Encounter (Signed)
If they are unable to come in, then ok.  Thanks for handling this.  Let me know if I need to do anything more.  Was he doing better now?

## 2016-02-16 NOTE — Assessment & Plan Note (Signed)
Followed by GI - Dr Allen Norris.  Off nadolol.

## 2016-02-16 NOTE — Assessment & Plan Note (Signed)
Seeing hematology.  Has had extensive GI w/up.  Receiving IV iron infusions.  Last hgb 9.5.

## 2016-02-16 NOTE — Assessment & Plan Note (Signed)
Followed by hematology.  Most recent platelet count 125.  Follow.

## 2016-02-17 ENCOUNTER — Inpatient Hospital Stay: Payer: PPO

## 2016-02-17 ENCOUNTER — Inpatient Hospital Stay (HOSPITAL_BASED_OUTPATIENT_CLINIC_OR_DEPARTMENT_OTHER): Payer: PPO | Admitting: Hematology and Oncology

## 2016-02-17 ENCOUNTER — Telehealth: Payer: Self-pay | Admitting: Internal Medicine

## 2016-02-17 ENCOUNTER — Other Ambulatory Visit: Payer: Self-pay

## 2016-02-17 VITALS — BP 143/70 | HR 80 | Temp 95.0°F | Resp 18 | Wt 170.2 lb

## 2016-02-17 DIAGNOSIS — D590 Drug-induced autoimmune hemolytic anemia: Secondary | ICD-10-CM | POA: Diagnosis not present

## 2016-02-17 DIAGNOSIS — Z79899 Other long term (current) drug therapy: Secondary | ICD-10-CM | POA: Diagnosis not present

## 2016-02-17 DIAGNOSIS — D500 Iron deficiency anemia secondary to blood loss (chronic): Secondary | ICD-10-CM

## 2016-02-17 DIAGNOSIS — D696 Thrombocytopenia, unspecified: Secondary | ICD-10-CM

## 2016-02-17 DIAGNOSIS — C9000 Multiple myeloma not having achieved remission: Secondary | ICD-10-CM

## 2016-02-17 LAB — CBC WITH DIFFERENTIAL/PLATELET
Basophils Absolute: 0.1 10*3/uL (ref 0–0.1)
Basophils Relative: 1 %
Eosinophils Absolute: 0.4 10*3/uL (ref 0–0.7)
Eosinophils Relative: 9 %
HCT: 30 % — ABNORMAL LOW (ref 40.0–52.0)
Hemoglobin: 10.4 g/dL — ABNORMAL LOW (ref 13.0–18.0)
Lymphocytes Relative: 22 %
Lymphs Abs: 1.1 10*3/uL (ref 1.0–3.6)
MCH: 26.9 pg (ref 26.0–34.0)
MCHC: 34.6 g/dL (ref 32.0–36.0)
MCV: 77.9 fL — ABNORMAL LOW (ref 80.0–100.0)
Monocytes Absolute: 0.6 10*3/uL (ref 0.2–1.0)
Monocytes Relative: 12 %
Neutro Abs: 2.7 10*3/uL (ref 1.4–6.5)
Neutrophils Relative %: 56 %
Platelets: 115 10*3/uL — ABNORMAL LOW (ref 150–440)
RBC: 3.85 MIL/uL — ABNORMAL LOW (ref 4.40–5.90)
RDW: 20.5 % — ABNORMAL HIGH (ref 11.5–14.5)
WBC: 4.8 10*3/uL (ref 3.8–10.6)

## 2016-02-17 LAB — IRON AND TIBC
Iron: 23 ug/dL — ABNORMAL LOW (ref 45–182)
Saturation Ratios: 6 % — ABNORMAL LOW (ref 17.9–39.5)
TIBC: 388 ug/dL (ref 250–450)
UIBC: 365 ug/dL

## 2016-02-17 LAB — FERRITIN: Ferritin: 23 ng/mL — ABNORMAL LOW (ref 24–336)

## 2016-02-17 LAB — COMPREHENSIVE METABOLIC PANEL
ALT: 25 U/L (ref 17–63)
AST: 31 U/L (ref 15–41)
Albumin: 3.5 g/dL (ref 3.5–5.0)
Alkaline Phosphatase: 62 U/L (ref 38–126)
Anion gap: 9 (ref 5–15)
BUN: 38 mg/dL — ABNORMAL HIGH (ref 6–20)
CO2: 19 mmol/L — ABNORMAL LOW (ref 22–32)
Calcium: 10.1 mg/dL (ref 8.9–10.3)
Chloride: 107 mmol/L (ref 101–111)
Creatinine, Ser: 1.53 mg/dL — ABNORMAL HIGH (ref 0.61–1.24)
GFR calc Af Amer: 49 mL/min — ABNORMAL LOW (ref >60)
GFR calc non Af Amer: 42 mL/min — ABNORMAL LOW (ref >60)
Glucose, Bld: 242 mg/dL — ABNORMAL HIGH (ref 65–99)
Potassium: 5.1 mmol/L (ref 3.5–5.1)
Sodium: 135 mmol/L (ref 135–145)
Total Bilirubin: 0.7 mg/dL (ref 0.3–1.2)
Total Protein: 8.5 g/dL — ABNORMAL HIGH (ref 6.5–8.1)

## 2016-02-17 LAB — SEDIMENTATION RATE: Sed Rate: 9 mm/hr (ref 0–20)

## 2016-02-17 NOTE — Progress Notes (Signed)
[facility] day:  02/17/16  Chief Complaint: Zachary Buck is a 75 y.o. male  with stage II multiple myeloma and iron deficiency anemia who is seen for 1 month assessment.  HPI: The patient was last seen in the medical oncology clinic on 01/11/2016.  At that time, he felt good.  Weight was down 11 pounds.  He had cut out salt and fat.  He was on aldactone.  Labs revealed an M-spike of 2.7 gm/dL.  Ferritin was 10.  He received Venofer x weekly x 2 (01/18/2016 - 01/25/2016).  He is scheduled for repeat EGD on 02/24/2016.  CBC on 02/03/2016 revealed a hematocrit of 29.5, hemoglobin 9.9, MCV 80.3, platelets 156,000, WBC 6700 with an ANC of 4900.  Ferritin was 88.  His wife notes that his sugar has been elevated (spiked into the 500 range, but subsequently stable).  He has been eating a lot of carbohydrates.  He is now checking his blood sugar 3x/day.  Symptomatically, he feels fatigued and "blah".  He denies any issues with infections.  He notes intermittent diarrhea and upper abdominal discomfort.  He has intermittent pain across his upper hips.  He denies any melena or hematochezia.   Past Medical History:  Diagnosis Date  . Anemia   . Atherosclerotic heart disease    s/p MI, s/p stent mid circumflex  . CAD (coronary artery disease)    Dr Nehemiah Massed, cardiologist  . Cirrhosis of liver ([place])   . Clavicle fracture   . Diabetes mellitus without complication ([place])    oral med  . Elevated blood sugar    02/03/16 and 02/09/16 had BS over 400.  otherwise running around 250  . Esophageal varices ([place])   . Hepatic encephalopathy ([place])   . Hypercholesterolemia   . Hypertension    controlled on meds  . Internal hemorrhoids   . Leg fracture   . Multiple myeloma ([place])   . Myocardial infarct   . Prostate enlargement   . Tubular adenoma of colon     Past Surgical History:  Procedure Laterality Date  . COLONOSCOPY WITH PROPOFOL N/A  03/04/2015   Procedure: COLONOSCOPY WITH PROPOFOL;  Surgeon: Jerene Bears, MD;  Location: [facility];  Service: Gastroenterology;  Laterality: N/A;  . COLONOSCOPY WITH PROPOFOL N/A 12/09/2015   Procedure: COLONOSCOPY WITH PROPOFOL;  Surgeon: [name], MD;  Location: [facility];  Service: Endoscopy;  Laterality: N/A;  DIABETIC-ORAL MEDS  . CORONARY ANGIOPLASTY WITH STENT PLACEMENT     2 stents  . ESOPHAGOGASTRODUODENOSCOPY (EGD) WITH PROPOFOL N/A 03/04/2015   Procedure: ESOPHAGOGASTRODUODENOSCOPY (EGD) WITH PROPOFOL;  Surgeon: Jerene Bears, MD;  Location: [facility];  Service: Gastroenterology;  Laterality: N/A;  . ESOPHAGOGASTRODUODENOSCOPY (EGD) WITH PROPOFOL N/A 12/09/2015   Procedure: ESOPHAGOGASTRODUODENOSCOPY (EGD) WITH PROPOFOL;  Surgeon: [name], MD;  Location: [place];  Service: Endoscopy;  Laterality: N/A;  . ESOPHAGOGASTRODUODENOSCOPY (EGD) WITH PROPOFOL N/A 12/21/2015   Procedure: ESOPHAGOGASTRODUODENOSCOPY (EGD) WITH PROPOFOL;  Surgeon: [name], MD;  Location: [facility];  Service: Endoscopy;  Laterality: N/A;  . POLYPECTOMY N/A 12/09/2015   Procedure: POLYPECTOMY;  Surgeon: [name], MD;  Location: [place];  Service: Endoscopy;  Laterality: N/A;  . TONSILLECTOMY      Family History  Problem Relation Age of Onset  . Aneurysm Father 52    of the heart  . Heart attack Father   . Diabetes Mother     Social History:  reports  that he has never smoked. He has never used smokeless tobacco. He reports that he does not drink alcohol or use drugs.  Patient denies any exposure to radiation or toxins. The patient is accompanied by his wife, [name], today.  Allergies:  Allergies  Allergen Reactions  . Niaspan [Niacin Er] Other (See Comments)    Just does not want to take     Current Medications: Current Outpatient Prescriptions  Medication Sig Dispense Refill  . Alpha-Lipoic Acid (LIPOIC ACID PO) Take 200 mg by mouth daily.      . Ascorbic Acid (VITAMIN C) 1000 MG tablet Take 1,000 mg by mouth daily.    . B Complex Vitamins (VITAMIN B COMPLEX) TABS Take by mouth daily.    . blood glucose meter kit and supplies KIT Dispense based on patient and insurance preference. Use up to four times daily as directed. (FOR ICD-9 250.00, 250.01). 1 each 0  . blood glucose meter kit and supplies KIT Dispense based on patient and insurance preference. Use up to four times daily as directed. E11.9 Please dispense Freestyle freedom meter and supplies. 1 each 0  . Cholecalciferol (VITAMIN D-3) 1000 UNITS CAPS Take by mouth.    . CHROMIUM PO Take 600 mg by mouth daily.     . Cinnamon 500 MG capsule Take 500 mg by mouth.    . Coenzyme Q10 100 MG capsule Take by mouth.    . furosemide (LASIX) 20 MG tablet Take 1 tablet (20 mg total) by mouth 2 (two) times daily. 60 tablet 3  . Ginger, Zingiber officinalis, (GINGER ROOT) 550 MG CAPS Take by mouth daily.    Marland Kitchen glipiZIDE (GLUCOTROL) 5 MG tablet TAKE 1 TABLET BY MOUTH EVERY MORNING AND 2 TABLETS EVERY EVENING 270 tablet 2  . glucose blood (BAYER CONTOUR NEXT TEST) test strip Contour Next EZ test strips: check blood sugars twice a day. Dx: 250.00    . glucose blood (ONE TOUCH ULTRA TEST) test strip Use as instructed 100 each 12  . Lancets (ONETOUCH ULTRASOFT) lancets Use as instructed 100 each 12  . lisinopril (PRINIVIL,ZESTRIL) 20 MG tablet TAKE ONE TABLET BY MOUTH EVERY DAY/ AM    . Magnesium 500 MG CAPS Take 500 mg by mouth daily.    . metFORMIN (GLUCOPHAGE) 1000 MG tablet TAKE 1 TABLET(1000 MG) BY MOUTH TWICE DAILY WITH A MEAL 60 tablet 5  . metFORMIN (GLUCOPHAGE) 1000 MG tablet TAKE 1 TABLET(1000 MG) BY MOUTH TWICE DAILY WITH A MEAL 60 tablet 2  . Omega-3 Fatty Acids (FISH OIL) 1000 MG CAPS Take 4,000 mg by mouth daily.     Marland Kitchen OVER THE COUNTER MEDICATION BUPLEURUM    . OVER THE COUNTER MEDICATION 250 MG CITICOLINE    . OVER THE COUNTER MEDICATION     . OVER THE COUNTER MEDICATION     . OVER  THE COUNTER MEDICATION     . OVER THE COUNTER MEDICATION     . sildenafil (REVATIO) 20 MG tablet 3 to 5 tablets per day or as instructed    . spironolactone (ALDACTONE) 50 MG tablet Take 1 tablet (50 mg total) by mouth daily. 30 tablet 3  . Theanine 100 MG CAPS Take by mouth daily.    Marland Kitchen triamcinolone cream (KENALOG) 0.1 % Apply topically 2 (two) times daily. Please avoid face and genitalia area.  Do not use in the same spot for more than 7-10 days in a row. 80 g 0  . Turmeric POWD 4,000 mg by  Does not apply route daily.    . vitamin B-12 (CYANOCOBALAMIN) 100 MCG tablet Take 200 mcg by mouth daily.    . SBI/Protein Isolate (ENTERAGAM) 5 g PACK Take 1 packet by mouth daily. (Patient not taking: Reported on 02/17/2016) 30 each 3   No current facility-administered medications for this visit.    Facility-Administered Medications Ordered in Other Visits  Medication Dose Route Frequency Provider Last Rate Last Dose  . alteplase (CATHFLO ACTIVASE) injection 2 mg  2 mg Intracatheter Once PRN Lequita Asal, MD      . heparin lock flush 100 unit/mL  500 Units Intracatheter Once PRN Lequita Asal, MD      . heparin lock flush 100 unit/mL  250 Units Intracatheter Once PRN Lequita Asal, MD      . sodium chloride 0.9 % injection 10 mL  10 mL Intracatheter PRN Lequita Asal, MD      . sodium chloride 0.9 % injection 3 mL  3 mL Intravenous Once PRN Lequita Asal, MD        Review of Systems:  GENERAL:  Feels fatigued, "blah".  No fevers or sweats.  Weight down 3 pounds. PERFORMANCE STATUS (ECOG):  1 HEENT:  No visual changes, runny nose, sore throat, mouth sores or tenderness. Lungs: No shortness of breath or cough.  No hemoptysis. Cardiac:  No chest pain, palpitations, orthopnea, or PND. GI:  Eating well.  Intermittent diarrhea and upper abdominal pain.  No nausea, vomiting,constipation, melena or hematochezia.  GU:  No urgency, frequency, dysuria, or  hematuria. Musculoskeletal:  Intermittent pain across upper hips.  No back pain.  No joint pain.  No muscle tenderness. Extremities:  No pain or swelling. Skin:  No rashes or skin changes. Neuro:  Short term memory issues.  No headache, numbness or weakness, balance or coordination issues. Endocrine:  Diabetes (see HPI).  No thyroid issues, hot flashes or night sweats. Psych:  No mood changes, depression or anxiety. Pain:  Intermittent upper abdominal and upper hip pain. Review of systems:  All other systems reviewed and found to be negative.  Physical Exam: BP (!) 143/70 (BP Location: Left Arm, Patient Position: Sitting)   Pulse 80   Temp (!) 95 F (35 C) (Tympanic)   Resp 18   Wt 170 lb 3.1 oz (77.2 kg)   BMI 25.13 kg/m   GENERAL:  Well developed, well nourished, gentleman sitting comfortably in the exam room in no acute distress. MENTAL STATUS:  Alert and oriented to person, place and time. HEAD:  Pearline Cables hair.  Normocephalic, atraumatic, face symmetric, no Cushingoid features. EYES:  Glasses.  Blue eyes.  Pupils equal round and reactive to light and accomodation.  No conjunctivitis or scleral icterus. ENT:  Oropharynx clear without lesion.  Tongue normal. Mucous membranes moist.  RESPIRATORY:  Clear to auscultation without rales, wheezes or rhonchi. CARDIOVASCULAR:  Regular rate and rhythm without murmur, rub or gallop. ABDOMEN:  Soft, slightly tender in epigastric region.  No guarding or rebound tenderness.  Active bowel sounds, and no appreciable hepatosplenomegaly.  No masses. SKIN:  No rashes, ulcers or lesions. EXTREMITIES: No edema, no skin discoloration or tenderness.  No palpable cords. LYMPH NODES: No palpable cervical, supraclavicular, axillary or inguinal adenopathy  NEUROLOGICAL: Unremarkable. PSYCH:  Appropriate.    Orders Only on 02/17/2016  Component Date Value Ref Range Status  . WBC 02/17/2016 4.8  3.8 - 10.6 K/uL Final  . RBC 02/17/2016 3.85* 4.40 - 5.90 MIL/uL  Final  .  Hemoglobin 02/17/2016 10.4* 13.0 - 18.0 g/dL Final  . HCT 02/17/2016 30.0* 40.0 - 52.0 % Final  . MCV 02/17/2016 77.9* 80.0 - 100.0 fL Final  . MCH 02/17/2016 26.9  26.0 - 34.0 pg Final  . MCHC 02/17/2016 34.6  32.0 - 36.0 g/dL Final  . RDW 02/17/2016 20.5* 11.5 - 14.5 % Final  . Platelets 02/17/2016 115* 150 - 440 K/uL Final  . Neutrophils Relative % 02/17/2016 56  % Final  . Neutro Abs 02/17/2016 2.7  1.4 - 6.5 K/uL Final  . Lymphocytes Relative 02/17/2016 22  % Final  . Lymphs Abs 02/17/2016 1.1  1.0 - 3.6 K/uL Final  . Monocytes Relative 02/17/2016 12  % Final  . Monocytes Absolute 02/17/2016 0.6  0.2 - 1.0 K/uL Final  . Eosinophils Relative 02/17/2016 9  % Final  . Eosinophils Absolute 02/17/2016 0.4  0 - 0.7 K/uL Final  . Basophils Relative 02/17/2016 1  % Final  . Basophils Absolute 02/17/2016 0.1  0 - 0.1 K/uL Final  . Sed Rate 02/17/2016 9  0 - 20 mm/hr Final    Assessment:  Zachary Buck is a 75 y.o. male with iron deficiency anemia and stage II multiple myeloma. 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 Serum immunoglobulins noted an IgG of 2930 (high). 24-hour urine revealed a 8.8% monoclonal protein (10.6 mg/24 hours).  Albumen was 3.3 on 08/26/2014. He had pneumonia in 05/2014.   Bone survey on 10/20/2014 revealed no lytic lesions. Bone survey on 09/16/2015 revealed no focal lytic lesion or acute bony abnormality.   SPEP was stable: 2.3 g/dL on 10/09/2014, 01/18/2015, 03/22/2015, 06/18/2015, 09/13/2015, and 11/08/2015.  SPEP was 2.7 gm/dL on 01/11/2016 and 2.8 gm/dL on 02/17/2016. Lambda free light chainswere155.3 (ratio 0.15) on 01/18/2015, 174.59 (ratio of 0.16)on 06/18/2015, 182.9 (ratio of 0.13)on 09/13/2015, 205 (ratio 0.11) on 11/08/2015, and 292.7 on 01/11/2016.    Bone marrow biopsy on 11/10/2014 revealed a 10% monoclonal plasma cell infiltrate.  Marrow was hypercellular for age (40-50%) with mixed maturing hematopoiesis, relative erythroid hyperplasia and mild nonspecific dyserythropoiesis. There was patchy mild focally moderate increase in reticulin. There were no significant iron stores. Myeloma FISH panel revealed translocation (11;14) which results in fusion of CCND1 (BCL1) at 11q13 with the immunoglobulin heavy chain gene (IgH) at 14q32 (a favorable prognostic feature).   Beta2 microglobulin was 4.2 on 11/24/2014, 3.8 on 06/18/2015, and 4.9 on 10/18/2015.  PET scan on 12/07/2014 revealed no hypermetabolic foci within the marrow space or nodal stations to suggest active multiple myeloma. There was hypermetabolism within the right lower lobe, corresponding to a similar dependent density. Morphology favored scarring.There was cirrhosis and portal hypertension. There was gastric body hypermetabolism favoring physiologic. Spleen was 15.3 cm (volume 1065 cc).  There was pulmonary artery enlargement suggesting pulmonary artery hypertension. 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  He has a history of asbestos exposure in the [employer].  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  Etiology is felt secondary to fatty liver.  Abdominal CT scan on 03/16/2015 revealed splenomegaly (14.5 cm), cirrhosis, and stigmata of portal hypertension with esophageal and gastric varices.  There were no liver lesions.  He is on aldactone.  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  There were no pleural effusions.  Posterior lower lobe predominant interstitial lung disease was characterized by subpleural lines, subpleural reticulation and mild  traction bronchiectasis, slowly progressive back to 2008, most consistent with asbestosis.  There was no frank honeycombing. This finding accounted for the medial right lower lobe hypermetabolism on the 12/07/2014 PET-CT.  There  was cirrhosis, splenomegaly, and prominent gastroesophageal varices.  He has had mild anemia since 10/2013 and mild thrombocytopenia since 05/2014.   Colonoscopy in 10/2013 revealed polyps.  Colonoscopy on 03/04/2015 revealed a sessile polyp was seen in the transverse colon (tubular adenoma).  Colonoscopy on 12/09/2015 revealed one 4 mm polyp in the ascending colon and two 4-5 mm polyps in the sigmoid colon (hyperplastic polyps).    EGD in 10/2013 revealed gastritis. EGD on 03/04/2015 revealed medium-sized esophageal varices in the mid and distal esophagus without bleeding. There was a sessile polyp in the gastric antrum (hyperplastic polyp).  There were 3 small angiodysplastic lesions in the second part of the duodenum.  EGD on 12/09/2015 revealed grade II esophageal varices.  There was a single gastric polyp.  There was a single bleeding angioectasia in the duodenum treated with bipolar cautery and clips.   Capsule enteroscopy on 11/27/2014 revealed 1-2 gastric polyps and one colon polyp.  There were no findings to explain iron deficiency anemia.  His diet was good. Labs confirmed iron deficiency (ferritin 11.8 on 05/2014 and 13 on 10/09/2014). B12 was low normal (MMA normal) thus ruling out B12 deficiency.  He has chronic diarrhea (2 years).  Diarrhea improved on Enterogam (began 06/22/2015).  He has intermittent diarrhea.    He has recurrent iron deficiency anemia.  He received 200 mg Venofer weekly 3 (03/26/2015 - 04/14/2015), weekly x 3 (06/25/2015 - 07/09/2015), weekly x 3 (09/17/2015 - 10/01/2015), weekly x 3 (11/16/2015 - 11/30/2015), and weekly x 2 (01/18/2016 - 01/25/2016).  Ferritin was 23 on 06/18/2015, 121 on 07/26/2015, 17 on 09/13/2015, 18 on 11/15/2015, 52 on 12/13/2015, 10 on 01/11/2016, 88 on 02/03/2016, and 23 on 02/17/2016.  Symptomatically, he feels fatigued.  He notes intermittent epigastric discomfort and discomfort across his upper hips.  Plan: 1.  Labs today:   CBC with diff, CMP, SPEP, ferritin, iron studies, ESR. 2.  Nurse to contact patient for additional Venofer If ferritin < 30. 3.  Discuss increased M-spike at last visit with decreased platelet count.  Discuss reassessing myeloma with bone marrow.  We discussed various medications used to treat myeloma.  After some discussion, decision made to schedule bone marrow If M spike > 2.7. 4.  Follow-up EGD on 02/24/2016. 5.  RTC in 1 month for MD assess, labs (CBC with diff, CMP, SPEP, free light chains, ferritin- day before appt).  Addendum:  Patient was contacted regarding increased M-spike and decreased ferritin.  He will be scheduled for additional IV iron.  He will be schedule for bone marrow biopsy.   Lequita Asal, MD  02/17/2016, 11:38 AM

## 2016-02-17 NOTE — Telephone Encounter (Signed)
I spoke with wife and gave her the numbers she requested

## 2016-02-17 NOTE — Telephone Encounter (Signed)
Pt wife received call that pt needs venofer tomorrow. She wants someone to please call her and give her his lab results today. She said to call: [phone] and if she doesn't answer that line to call: [phone]. Thanks.

## 2016-02-17 NOTE — Progress Notes (Signed)
Patient was seen @ urgent care in [place] the week before christmas.  Patient was not feeling well.  UA was negative. Blood glucose was elevated. (500's) Patient is now not eating any carbs or sugar. Blood glucose is lower (200's).  No changes in medications. Patient has been unable to get in with his PCP.  Has an appointment next week.  Patient has had some nausea over the past couple of weeks.

## 2016-02-18 ENCOUNTER — Inpatient Hospital Stay: Payer: PPO

## 2016-02-18 ENCOUNTER — Encounter: Payer: Self-pay | Admitting: Internal Medicine

## 2016-02-18 ENCOUNTER — Telehealth: Payer: Self-pay | Admitting: *Deleted

## 2016-02-18 ENCOUNTER — Ambulatory Visit (INDEPENDENT_AMBULATORY_CARE_PROVIDER_SITE_OTHER): Payer: PPO | Admitting: Internal Medicine

## 2016-02-18 VITALS — BP 151/81 | HR 77 | Temp 96.6°F | Resp 20

## 2016-02-18 DIAGNOSIS — C9000 Multiple myeloma not having achieved remission: Secondary | ICD-10-CM

## 2016-02-18 DIAGNOSIS — I8510 Secondary esophageal varices without bleeding: Secondary | ICD-10-CM | POA: Diagnosis not present

## 2016-02-18 DIAGNOSIS — E119 Type 2 diabetes mellitus without complications: Secondary | ICD-10-CM

## 2016-02-18 DIAGNOSIS — I2510 Atherosclerotic heart disease of native coronary artery without angina pectoris: Secondary | ICD-10-CM

## 2016-02-18 DIAGNOSIS — K766 Portal hypertension: Secondary | ICD-10-CM | POA: Diagnosis not present

## 2016-02-18 DIAGNOSIS — D696 Thrombocytopenia, unspecified: Secondary | ICD-10-CM

## 2016-02-18 DIAGNOSIS — D500 Iron deficiency anemia secondary to blood loss (chronic): Secondary | ICD-10-CM

## 2016-02-18 DIAGNOSIS — R42 Dizziness and giddiness: Secondary | ICD-10-CM

## 2016-02-18 DIAGNOSIS — I10 Essential (primary) hypertension: Secondary | ICD-10-CM

## 2016-02-18 LAB — PROTEIN ELECTROPHORESIS, SERUM
A/G Ratio: 0.8 (ref 0.7–1.7)
Albumin ELP: 3.6 g/dL (ref 2.9–4.4)
Alpha-1-Globulin: 0.2 g/dL (ref 0.0–0.4)
Alpha-2-Globulin: 0.7 g/dL (ref 0.4–1.0)
Beta Globulin: 1 g/dL (ref 0.7–1.3)
Gamma Globulin: 3 g/dL — ABNORMAL HIGH (ref 0.4–1.8)
Globulin, Total: 4.8 g/dL — ABNORMAL HIGH (ref 2.2–3.9)
M-Spike, %: 2.8 g/dL — ABNORMAL HIGH
PDF: 0
Total Protein ELP: 8.4 g/dL (ref 6.0–8.5)

## 2016-02-18 MED ORDER — IRON SUCROSE 20 MG/ML IV SOLN
200.0000 mg | Freq: Once | INTRAVENOUS | Status: AC
  Administered 2016-02-18: 200 mg via INTRAVENOUS
  Filled 2016-02-18: qty 10

## 2016-02-18 MED ORDER — SODIUM CHLORIDE 0.9 % IV SOLN: 200.0000 mg | Freq: Once | INTRAVENOUS | Status: DC

## 2016-02-18 MED ORDER — SODIUM CHLORIDE 0.9 % IV SOLN
Freq: Once | INTRAVENOUS | Status: AC
  Administered 2016-02-18: 10:00:00 via INTRAVENOUS
  Filled 2016-02-18: qty 1000

## 2016-02-18 NOTE — Patient Instructions (Addendum)
Had been on lasix 20mg  twice a day.  Instructed to decrease lasix to once a day  Has been on spironolactone 50mg  per day.  Decrease to 1/2 (50mg ) per day.    Keep appt on 02/23/16

## 2016-02-18 NOTE — Progress Notes (Signed)
Pre visit review using our clinic review tool, if applicable. No additional management support is needed unless otherwise documented below in the visit note. 

## 2016-02-18 NOTE — Progress Notes (Signed)
Patient ID: Zachary Buck, male   DOB: 04/10/1940, 75 y.o.   MRN: [id number]   Subjective:    Patient ID: Zachary Buck, male    DOB: November 10, 1940, 75 y.o.   MRN: [id number]  HPI  Patient here as a work in with concerns regarding elevated blood sugar.  Still with some intermittent dizziness and light headedness.  Discussed his previous w/up and testing at length with him today.  Discussed his multiple issues, including the change in kidney function, previous issues with varices and ascites and his multiple myeloma.  We discussed his sugar variation.  Metformin has worked well for him in the past.  Now with varying sugars and more elevated sugars.  Sugars recently more in the 180-280 range.  Discussed concern regarding using metformin if kidney function continues to decline.  He is watching his diet.  Is eating.  No vomiting.  Stable abdominal discomfort.  Overall feels his breathing is stable.  Has episodes where he does not feel as well.  Some dizziness.  Some note of some slurring with talking at times.  No other focal neuro changes.     Past Medical History:  Diagnosis Date  . Anemia   . Atherosclerotic heart disease    s/p MI, s/p stent mid circumflex  . CAD (coronary artery disease)    Dr Nehemiah Massed, cardiologist  . Cirrhosis of liver ([place])   . Clavicle fracture   . Diabetes mellitus without complication ([place])    oral med  . Elevated blood sugar    02/03/16 and 02/09/16 had BS over 400.  otherwise running around 250  . Esophageal varices ([place])   . Hepatic encephalopathy ([place])   . Hypercholesterolemia   . Hypertension    controlled on meds  . Internal hemorrhoids   . Leg fracture   . Multiple myeloma ([place])   . Myocardial infarct   . Prostate enlargement   . Tubular adenoma of colon    Past Surgical History:  Procedure Laterality Date  . COLONOSCOPY WITH PROPOFOL N/A 03/04/2015   Procedure: COLONOSCOPY WITH PROPOFOL;  Surgeon: Jerene Bears, MD;  Location: [facility]  ENDOSCOPY;  Service: Gastroenterology;  Laterality: N/A;  . COLONOSCOPY WITH PROPOFOL N/A 12/09/2015   Procedure: COLONOSCOPY WITH PROPOFOL;  Surgeon: [name], MD;  Location: [facility];  Service: Endoscopy;  Laterality: N/A;  DIABETIC-ORAL MEDS  . CORONARY ANGIOPLASTY WITH STENT PLACEMENT     2 stents  . ESOPHAGOGASTRODUODENOSCOPY (EGD) WITH PROPOFOL N/A 03/04/2015   Procedure: ESOPHAGOGASTRODUODENOSCOPY (EGD) WITH PROPOFOL;  Surgeon: Jerene Bears, MD;  Location: [facility] END[facility]COPY;  Service: Gastroenterology;  Laterality: N/A;  . ESOPHAGOGASTRODUODENOSCOPY (EGD) WITH PROPOFOL N/A 12/09/2015   Procedure: ESOPHAGOGASTRODUODENOSCOPY (EGD) WITH PROPOFOL;  Surgeon: [name], MD;  Location: [facility];  Service: Endoscopy;  Laterality: N/A;  . ESOPHAGOGASTRODUODENOSCOPY (EGD) WITH PROPOFOL N/A 12/21/2015   Procedure: ESOPHAGOGASTRODUODENOSCOPY (EGD) WITH PROPOFOL;  Surgeon: [name], MD;  Location: [facility] ENDOSCOPY;  Service: Endoscopy;  Laterality: N/A;  . POLYPECTOMY N/A 12/09/2015   Procedure: POLYPECTOMY;  Surgeon: [name], MD;  Location: [facility];  Service: Endoscopy;  Laterality: N/A;  . TONSILLECTOMY     Family History  Problem Relation Age of Onset  . Aneurysm Father 13    of the heart  . Heart attack Father   . Diabetes Mother    Social History   Social History  . Marital status: Married    Spouse name: N/A  . Number of children: 2  .  Years of education: N/A   Occupational History  . Estate manager/land agent   Social History Main Topics  . Smoking status: Never Smoker  . Smokeless tobacco: Never Used  . Alcohol use No  . Drug use: No  . Sexual activity: Not Asked   Other Topics Concern  . None   Social History Narrative  . None    Outpatient Encounter Prescriptions as of 02/18/2016  Medication Sig  . Alpha-Lipoic Acid (LIPOIC ACID PO) Take 200 mg by mouth daily.   . Ascorbic Acid (VITAMIN C) 1000 MG tablet Take 1,000 mg by mouth daily.    . B Complex Vitamins (VITAMIN B COMPLEX) TABS Take by mouth daily.  . blood glucose meter kit and supplies KIT Dispense based on patient and insurance preference. Use up to four times daily as directed. (FOR ICD-9 250.00, 250.01).  . blood glucose meter kit and supplies KIT Dispense based on patient and insurance preference. Use up to four times daily as directed. E11.9 Please dispense Freestyle freedom meter and supplies.  . Cholecalciferol (VITAMIN D-3) 1000 UNITS CAPS Take by mouth.  . CHROMIUM PO Take 600 mg by mouth daily.   . Cinnamon 500 MG capsule Take 500 mg by mouth.  . Coenzyme Q10 100 MG capsule Take by mouth.  . furosemide (LASIX) 20 MG tablet Take 1 tablet (20 mg total) by mouth 2 (two) times daily.  . Ginger, Zingiber officinalis, (GINGER ROOT) 550 MG CAPS Take by mouth daily.  Marland Kitchen glipiZIDE (GLUCOTROL) 5 MG tablet TAKE 1 TABLET BY MOUTH EVERY MORNING AND 2 TABLETS EVERY EVENING  . glucose blood (BAYER CONTOUR NEXT TEST) test strip Contour Next EZ test strips: check blood sugars twice a day. Dx: 250.00  . glucose blood (ONE TOUCH ULTRA TEST) test strip Use as instructed  . Lancets (ONETOUCH ULTRASOFT) lancets Use as instructed  . lisinopril (PRINIVIL,ZESTRIL) 20 MG tablet TAKE ONE TABLET BY MOUTH EVERY DAY/ AM  . Magnesium 500 MG CAPS Take 500 mg by mouth daily.  . metFORMIN (GLUCOPHAGE) 1000 MG tablet TAKE 1 TABLET(1000 MG) BY MOUTH TWICE DAILY WITH A MEAL  . metFORMIN (GLUCOPHAGE) 1000 MG tablet TAKE 1 TABLET(1000 MG) BY MOUTH TWICE DAILY WITH A MEAL  . Omega-3 Fatty Acids (FISH OIL) 1000 MG CAPS Take 4,000 mg by mouth daily.   Marland Kitchen OVER THE COUNTER MEDICATION BUPLEURUM  . OVER THE COUNTER MEDICATION 250 MG CITICOLINE  . OVER THE COUNTER MEDICATION   . OVER THE COUNTER MEDICATION   . OVER THE COUNTER MEDICATION   . OVER THE COUNTER MEDICATION   . SBI/Protein Isolate (ENTERAGAM) 5 g PACK Take 1 packet by mouth daily.  . sildenafil (REVATIO) 20 MG tablet 3 to 5 tablets per day or  as instructed  . spironolactone (ALDACTONE) 50 MG tablet Take 1 tablet (50 mg total) by mouth daily.  . Theanine 100 MG CAPS Take by mouth daily.  Marland Kitchen triamcinolone cream (KENALOG) 0.1 % Apply topically 2 (two) times daily. Please avoid face and genitalia area.  Do not use in the same spot for more than 7-10 days in a row.  . Turmeric POWD 4,000 mg by Does not apply route daily.  . vitamin B-12 (CYANOCOBALAMIN) 100 MCG tablet Take 200 mcg by mouth daily.   Facility-Administered Encounter Medications as of 02/18/2016  Medication  . alteplase (CATHFLO ACTIVASE) injection 2 mg  . heparin lock flush 100 unit/mL  . heparin lock flush 100 unit/mL  . sodium chloride 0.9 % injection 10  mL  . sodium chloride 0.9 % injection 3 mL    Review of Systems  Constitutional: Positive for fatigue.       Has lost weight overall.  Weight stable from the previous check.    HENT: Negative for congestion and sinus pressure.   Respiratory: Negative for cough and wheezing.   Cardiovascular: Negative for chest pain and leg swelling.  Gastrointestinal: Positive for abdominal pain. Negative for nausea and vomiting.  Genitourinary: Negative for difficulty urinating and dysuria.  Musculoskeletal: Negative for joint swelling and myalgias.  Skin: Negative for color change and rash.  Neurological: Positive for dizziness. Negative for headaches.  Psychiatric/Behavioral: Negative for agitation and dysphoric mood.       Objective:    Physical Exam  Constitutional: He appears well-developed. No distress.  HENT:  Nose: Nose normal.  Mouth/Throat: Oropharynx is clear and moist.  Neck: Neck supple.  Cardiovascular: Normal rate and regular rhythm.   Pulmonary/Chest: Effort normal and breath sounds normal. No respiratory distress.  Abdominal: Soft. Bowel sounds are normal.  Tenderness to palpation.  Unchanged.   Musculoskeletal: He exhibits no edema or tenderness.  Lymphadenopathy:    He has no cervical adenopathy.    Skin: No rash noted. No erythema.  Psychiatric: He has a normal mood and affect. His behavior is normal.    BP (!) 142/88   Pulse 88   Temp 97.6 F (36.4 C) (Oral)   Wt 170 lb 12.8 oz (77.5 kg)   SpO2 97%   BMI 25.22 kg/m  Wt Readings from Last 3 Encounters:  02/18/16 170 lb 12.8 oz (77.5 kg)  02/17/16 170 lb 3.1 oz (77.2 kg)  02/10/16 170 lb 12.8 oz (77.5 kg)     Lab Results  Component Value Date   WBC 4.8 02/17/2016   HGB 10.4 (L) 02/17/2016   HCT 30.0 (L) 02/17/2016   PLT 115 (L) 02/17/2016   GLUCOSE 242 (H) 02/17/2016   CHOL 154 12/17/2015   TRIG 266.0 (H) 12/17/2015   HDL 25.80 (L) 12/17/2015   LDLDIRECT 56.0 12/17/2015   LDLCALC 51 05/14/2013   ALT 25 02/17/2016   AST 31 02/17/2016   NA 135 02/17/2016   K 5.1 02/17/2016   CL 107 02/17/2016   CREATININE 1.53 (H) 02/17/2016   BUN 38 (H) 02/17/2016   CO2 19 (L) 02/17/2016   TSH 6.49 (H) 12/17/2015   INR 1.1 (H) 08/25/2015   HGBA1C 6.8 (H) 12/17/2015   MICROALBUR 22.4 (H) 04/29/2015       Assessment & Plan:   Problem List Items Addressed This Visit    CAD (coronary artery disease)    Followed by cardiology.       Diabetes ([place])    Sugars elevated.  Persistent intermittent elevation.  Discussed concern regarding continued metformin if renal function continues to decline.  Adjust lasix and aldactone and follow renal function.  Discussed SSI insulin and possible long term scheduled insulin to control blood sugar.  Will keep appt next week and he will bring his wife in for insulin instructions.        Dizziness    Still a persistent intermittent issues for him.  Unclear to exact etiology.  Not orthostatic on exam.  Will adjust the dosing of lasix and aldactone as outlined.  Discussed further w/up, especially given the slurring,etc.  He declines.  Declines MRI (brain) or any other w/up for this.  Wants to adjust the medication and see if symptoms resolve.  Will also get sugar  under better control overall.         Essential hypertension, benign    Blood pressure on my checks - 580D systolic.  Not orthostatic on exam.  Adjust lasix and aldactone as outlined.  Follow.        Iron deficiency anemia    Followed by hematology.  Has had extensive GI w/up and planning for f/u EGD next week.  Receiving iron infusions.        Multiple myeloma ([place])    Followed by hematology.        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  Was on nadolol.  Off now.  Being followed by GI.  Was started on aldactone and lasix for ascites.  Weight remaining stable.  Creatinine has increased overall when compared to a few months ago.  Relatively stable on recent checks.  He is concerned that the dizziness and episodes with not feeling as well (and slurring) may be related to these medications.  He is not sure what does he is taking of the lasix or aldactone.  Tried to contact his wife.  Unable to reach.  Will have him decrease his lasix to q day (if taking bid).  Will also have him decrease aldactone to 75m 1/2 tablet per day.  Will need to follow renal function.  Follow for increased ascites.  Keep scheduled f/u with me next week.        Secondary esophageal varices without bleeding ([facility])    Off nadolol.  Followed by Dr WAllen Norris- GI.  Due f/u EGD next week.        Thrombocytopenia ([place]    Followed by hematology.         I spent 45 minutes with the patient and more than 50% of the time was spent in consultation regarding the above.  Time spent discussing his current symptoms and previous w/up.  Time spent discussing current treatment plan and follow up.    SEinar Pheasant MD

## 2016-02-18 NOTE — Telephone Encounter (Signed)
Called patient and wife about mspike results of 2.8, they are in agreement with doing the bone marrow but only 1-2 weeks after endo that is scheduled for 02-24-16.  Will schedule and call back with appointments.

## 2016-02-23 ENCOUNTER — Encounter: Payer: Self-pay | Admitting: Internal Medicine

## 2016-02-23 ENCOUNTER — Ambulatory Visit (INDEPENDENT_AMBULATORY_CARE_PROVIDER_SITE_OTHER): Payer: PPO | Admitting: Internal Medicine

## 2016-02-23 ENCOUNTER — Telehealth: Payer: Self-pay | Admitting: *Deleted

## 2016-02-23 VITALS — BP 136/78 | HR 81 | Temp 98.1°F | Ht 69.0 in | Wt 173.4 lb

## 2016-02-23 DIAGNOSIS — C9000 Multiple myeloma not having achieved remission: Secondary | ICD-10-CM

## 2016-02-23 DIAGNOSIS — I8510 Secondary esophageal varices without bleeding: Secondary | ICD-10-CM | POA: Diagnosis not present

## 2016-02-23 DIAGNOSIS — E119 Type 2 diabetes mellitus without complications: Secondary | ICD-10-CM

## 2016-02-23 DIAGNOSIS — D696 Thrombocytopenia, unspecified: Secondary | ICD-10-CM

## 2016-02-23 DIAGNOSIS — K7469 Other cirrhosis of liver: Secondary | ICD-10-CM

## 2016-02-23 DIAGNOSIS — I2510 Atherosclerotic heart disease of native coronary artery without angina pectoris: Secondary | ICD-10-CM

## 2016-02-23 DIAGNOSIS — D500 Iron deficiency anemia secondary to blood loss (chronic): Secondary | ICD-10-CM

## 2016-02-23 DIAGNOSIS — N289 Disorder of kidney and ureter, unspecified: Secondary | ICD-10-CM | POA: Diagnosis not present

## 2016-02-23 DIAGNOSIS — I10 Essential (primary) hypertension: Secondary | ICD-10-CM

## 2016-02-23 DIAGNOSIS — K766 Portal hypertension: Secondary | ICD-10-CM | POA: Diagnosis not present

## 2016-02-23 DIAGNOSIS — E7800 Pure hypercholesterolemia, unspecified: Secondary | ICD-10-CM

## 2016-02-23 LAB — BASIC METABOLIC PANEL
BUN: 28 mg/dL — ABNORMAL HIGH (ref 6–23)
CALCIUM: 9.9 mg/dL (ref 8.4–10.5)
CO2: 23 meq/L (ref 19–32)
CREATININE: 1.25 mg/dL (ref 0.40–1.50)
Chloride: 105 mEq/L (ref 96–112)
GFR: 59.68 mL/min — ABNORMAL LOW (ref >60.00)
Glucose, Bld: 275 mg/dL — ABNORMAL HIGH (ref 70–99)
Potassium: 5.5 mEq/L — ABNORMAL HIGH (ref 3.5–5.1)
Sodium: 135 mEq/L (ref 135–145)

## 2016-02-23 MED ORDER — "INSULIN SYRINGE 31G X 5/16"" 0.5 ML MISC": 0 refills | Status: DC

## 2016-02-23 MED ORDER — INSULIN LISPRO 100 UNIT/ML ~~LOC~~ SOLN: SUBCUTANEOUS | 1 refills | Status: DC

## 2016-02-23 NOTE — Assessment & Plan Note (Signed)
Still a persistent intermittent issues for him.  Unclear to exact etiology.  Not orthostatic on exam.  Will adjust the dosing of lasix and aldactone as outlined.  Discussed further w/up, especially given the slurring,etc.  He declines.  Declines MRI (brain) or any other w/up for this.  Wants to adjust the medication and see if symptoms resolve.  Will also get sugar under better control overall.

## 2016-02-23 NOTE — Assessment & Plan Note (Signed)
Off nadolol.  Followed by Dr Allen Norris - GI.  Due f/u EGD next week.

## 2016-02-23 NOTE — Assessment & Plan Note (Signed)
Sugars elevated.  Persistent intermittent elevation.  Discussed concern regarding continued metformin if renal function continues to decline.  Adjust lasix and aldactone and follow renal function.  Discussed SSI insulin and possible long term scheduled insulin to control blood sugar.  Will keep appt next week and he will bring his wife in for insulin instructions.

## 2016-02-23 NOTE — Assessment & Plan Note (Signed)
Followed by hematology 

## 2016-02-23 NOTE — Assessment & Plan Note (Signed)
Blood pressure on my checks - [id number] systolic.  Not orthostatic on exam.  Adjust lasix and aldactone as outlined.  Follow.

## 2016-02-23 NOTE — Assessment & Plan Note (Signed)
Followed by cardiology 

## 2016-02-23 NOTE — Progress Notes (Signed)
Pre visit review using our clinic review tool, if applicable. No additional management support is needed unless otherwise documented below in the visit note. 

## 2016-02-23 NOTE — Assessment & Plan Note (Signed)
Has cryptogenic cirrhosis with portal hypertension.  Was on nadolol.  Off now.  Being followed by GI.  Was started on aldactone and lasix for ascites.  Weight remaining stable.  Creatinine has increased overall when compared to a few months ago.  Relatively stable on recent checks.  He is concerned that the dizziness and episodes with not feeling as well (and slurring) may be related to these medications.  He is not sure what does he is taking of the lasix or aldactone.  Tried to contact his wife.  Unable to reach.  Will have him decrease his lasix to q day (if taking bid).  Will also have him decrease aldactone to 50mg  1/2 tablet per day.  Will need to follow renal function.  Follow for increased ascites.  Keep scheduled f/u with me next week.

## 2016-02-23 NOTE — Discharge Instructions (Signed)

## 2016-02-23 NOTE — Telephone Encounter (Signed)
Patients's wife stated that husbands insurance will only cover a Rx for Novolog, the previous Rx sent in will not be covered. Patient was seen in the office today Pharmacy [facility]

## 2016-02-23 NOTE — Patient Instructions (Signed)
If blood sugar is 251-300 - give 2 units of insulin  Blood sugar 301-350 - give 4 units of insulin  Blood sugar 351-400 - give 6 units of insulin  Blood sugar >401 - give 8 units of insulin.

## 2016-02-23 NOTE — Assessment & Plan Note (Signed)
Followed by hematology.  Has had extensive GI w/up and planning for f/u EGD next week.  Receiving iron infusions.

## 2016-02-23 NOTE — Progress Notes (Signed)
Patient ID: Zachary Buck, male   DOB: 11-Apr-1940, 76 y.o.   MRN: [id number]   Subjective:    Patient ID: Zachary Buck, male    DOB: 22-Nov-1940, 76 y.o.   MRN: [id number]  HPI  Patient here for a scheduled follow up.  He is accompanied by his wife.  History obtained from both of them.   He has been having issues with episodes of dizziness, weakness and some slurring of speech.  See last note.  I saw him last week.  Decreased his spironolactone and lasix.  He reports he does feel some better.  His wife agrees.  States he is not as unsteady.  Not as dizzy.  He has a good appetite.  Eating well.  Sugars are improving.  Now averaging in the 200s.  I had a long discussion with the pt and his wife regarding his multiple medical issues.  Discussed his kidney function and the change.  Discussed the reason for decreasing the medication.  Discussed the need for monitoring for increased ascites, etc.  He has noticed no blood in his stool.  Still with persistent abdominal pain.  Unchanged.  No vomiting.  Breathing stable.  Discussed his myeloma and persistent anemia.  Planning for bone marrow biopsy.    Past Medical History:  Diagnosis Date  . Anemia   . Atherosclerotic heart disease    s/p MI, s/p stent mid circumflex  . CAD (coronary artery disease)    Dr Nehemiah Massed, cardiologist  . Cirrhosis of liver ([place])   . Clavicle fracture   . Diabetes mellitus without complication ([place])    oral med  . Elevated blood sugar    02/03/16 and 02/09/16 had BS over 400.  otherwise running around 250  . Esophageal varices ([place])   . Hepatic encephalopathy ([place])   . Hypercholesterolemia   . Hypertension    controlled on meds  . Internal hemorrhoids   . Leg fracture   . Multiple myeloma ([place])   . Myocardial infarct   . Prostate enlargement   . Tubular adenoma of colon    Past Surgical History:  Procedure Laterality Date  . COLONOSCOPY WITH PROPOFOL N/A 03/04/2015   Procedure: COLONOSCOPY WITH  PROPOFOL;  Surgeon: Jerene Bears, MD;  [facility];  Service: Gastroenterology;  Laterality: N/A;  . COLONOSCOPY WITH PROPOFOL N/A 12/09/2015   Procedure: COLONOSCOPY WITH PROPOFOL;  Surgeon: [name], MD;  Location: [place];  Service: Endoscopy;  Laterality: N/A;  DIABETIC-ORAL MEDS  . CORONARY ANGIOPLASTY WITH STENT PLACEMENT     2 stents  . ESOPHAGOGASTRODUODENOSCOPY (EGD) WITH PROPOFOL N/A 03/04/2015   Procedure: ESOPHAGOGASTRODUODENOSCOPY (EGD) WITH PROPOFOL;  Surgeon: Jerene Bears, MD;  [facility];  Service: Gastroenterology;  Laterality: N/A;  . ESOPHAGOGASTRODUODENOSCOPY (EGD) WITH PROPOFOL N/A 12/09/2015   Procedure: ESOPHAGOGASTRODUODENOSCOPY (EGD) WITH PROPOFOL;  Surgeon: [name], MD;  Location: [place];  Service: Endoscopy;  Laterality: N/A;  . ESOPHAGOGASTRODUODENOSCOPY (EGD) WITH PROPOFOL N/A 12/21/2015   Procedure: ESOPHAGOGASTRODUODENOSCOPY (EGD) WITH PROPOFOL;  Surgeon: [name], MD;  Location: [facility];  Service: Endoscopy;  Laterality: N/A;  . ESOPHAGOGASTRODUODENOSCOPY (EGD) WITH PROPOFOL N/A 02/24/2016   Procedure: ESOPHAGOGASTRODUODENOSCOPY (EGD) WITH PROPOFOL;  Surgeon: [name], MD;  Location: [place];  Service: Endoscopy;  Laterality: N/A;  diabetic - oral med  . POLYPECTOMY N/A 12/09/2015   Procedure: POLYPECTOMY;  Surgeon: [name], MD;  Location: [place];  Service: Endoscopy;  Laterality: N/A;  . TONSILLECTOMY  Family History  Problem Relation Age of Onset  . Aneurysm Father 42    of the heart  . Heart attack Father   . Diabetes Mother    Social History   Social History  . Marital status: Married    Spouse name: N/A  . Number of children: 2  . Years of education: N/A   Occupational History  . Estate manager/land agent   Social History Main Topics  . Smoking status: Never Smoker  . Smokeless tobacco: Never Used  . Alcohol use No  . Drug use: No  . Sexual activity: Not  Asked   Other Topics Concern  . None   Social History Narrative  . None    Outpatient Encounter Prescriptions as of 02/23/2016  Medication Sig  . Alpha-Lipoic Acid (LIPOIC ACID PO) Take 200 mg by mouth daily.   . Ascorbic Acid (VITAMIN C) 1000 MG tablet Take 1,000 mg by mouth daily.  . B Complex Vitamins (VITAMIN B COMPLEX) TABS Take by mouth daily.  . blood glucose meter kit and supplies KIT Dispense based on patient and insurance preference. Use up to four times daily as directed. (FOR ICD-9 250.00, 250.01).  . blood glucose meter kit and supplies KIT Dispense based on patient and insurance preference. Use up to four times daily as directed. E11.9 Please dispense Freestyle freedom meter and supplies.  . Cholecalciferol (VITAMIN D-3) 1000 UNITS CAPS Take by mouth.  . CHROMIUM PO Take 600 mg by mouth daily.   . Cinnamon 500 MG capsule Take 500 mg by mouth.  . Coenzyme Q10 100 MG capsule Take by mouth.  . furosemide (LASIX) 20 MG tablet Take 1 tablet (20 mg total) by mouth 2 (two) times daily.  . Ginger, Zingiber officinalis, (GINGER ROOT) 550 MG CAPS Take by mouth daily.  Marland Kitchen glipiZIDE (GLUCOTROL) 5 MG tablet TAKE 1 TABLET BY MOUTH EVERY MORNING AND 2 TABLETS EVERY EVENING  . glucose blood (BAYER CONTOUR NEXT TEST) test strip Contour Next EZ test strips: check blood sugars twice a day. Dx: 250.00  . glucose blood (ONE TOUCH ULTRA TEST) test strip Use as instructed  . Lancets (ONETOUCH ULTRASOFT) lancets Use as instructed  . lisinopril (PRINIVIL,ZESTRIL) 20 MG tablet TAKE ONE TABLET BY MOUTH EVERY DAY/ AM  . Magnesium 500 MG CAPS Take 500 mg by mouth daily.  . metFORMIN (GLUCOPHAGE) 1000 MG tablet TAKE 1 TABLET(1000 MG) BY MOUTH TWICE DAILY WITH A MEAL  . metFORMIN (GLUCOPHAGE) 1000 MG tablet TAKE 1 TABLET(1000 MG) BY MOUTH TWICE DAILY WITH A MEAL  . Omega-3 Fatty Acids (FISH OIL) 1000 MG CAPS Take 4,000 mg by mouth daily.   Marland Kitchen OVER THE COUNTER MEDICATION BUPLEURUM  . OVER THE COUNTER  MEDICATION 250 MG CITICOLINE  . OVER THE COUNTER MEDICATION   . OVER THE COUNTER MEDICATION   . OVER THE COUNTER MEDICATION   . OVER THE COUNTER MEDICATION   . SBI/Protein Isolate (ENTERAGAM) 5 g PACK Take 1 packet by mouth daily.  . sildenafil (REVATIO) 20 MG tablet 3 to 5 tablets per day or as instructed  . spironolactone (ALDACTONE) 50 MG tablet Take 1 tablet (50 mg total) by mouth daily.  . Theanine 100 MG CAPS Take by mouth daily.  Marland Kitchen triamcinolone cream (KENALOG) 0.1 % Apply topically 2 (two) times daily. Please avoid face and genitalia area.  Do not use in the same spot for more than 7-10 days in a row.  . Turmeric POWD 4,000 mg by Does not apply  route daily.  . vitamin B-12 (CYANOCOBALAMIN) 100 MCG tablet Take 200 mcg by mouth daily.  . insulin lispro (HUMALOG) 100 UNIT/ML injection Use sliding scale as directed.  (i.e., if blood sugar 251-300 - give 2 units, etc as outlined. (Patient not taking: Reported on 02/24/2016)  . Insulin Syringe-Needle U-100 (INSULIN SYRINGE .5CC/31GX5/16") 31G X 5/16" 0.5 ML MISC Will be using a SSI - for elevated blood sugars.   Facility-Administered Encounter Medications as of 02/23/2016  Medication  . alteplase (CATHFLO ACTIVASE) injection 2 mg  . heparin lock flush 100 unit/mL  . heparin lock flush 100 unit/mL  . sodium chloride 0.9 % injection 10 mL  . sodium chloride 0.9 % injection 3 mL    Review of Systems  Constitutional: Positive for fatigue. Negative for appetite change.  HENT: Negative for congestion and sinus pressure.   Respiratory: Negative for cough and chest tightness.        Breathing stable.   Cardiovascular: Negative for chest pain, palpitations and leg swelling.  Gastrointestinal: Positive for abdominal pain. Negative for diarrhea, nausea and vomiting.  Genitourinary: Negative for difficulty urinating and dysuria.  Musculoskeletal: Negative for joint swelling and myalgias.  Skin: Negative for color change and rash.  Neurological:  Positive for dizziness and light-headedness. Negative for headaches.  Psychiatric/Behavioral: Negative for agitation and dysphoric mood.       Objective:     Blood pressure rechecked by me:  [phone]  Physical Exam  Constitutional: He appears well-developed and well-nourished. No distress.  HENT:  Nose: Nose normal.  Mouth/Throat: Oropharynx is clear and moist.  Neck: Neck supple. No thyromegaly present.  Cardiovascular: Normal rate and regular rhythm.   Pulmonary/Chest: Effort normal and breath sounds normal. No respiratory distress.  Abdominal: Soft. Bowel sounds are normal.  Tenderness to palpation - abdomen.  Unchanged.    Musculoskeletal: He exhibits no edema or tenderness.  Lymphadenopathy:    He has no cervical adenopathy.  Skin: No rash noted. No erythema.  Psychiatric: He has a normal mood and affect. His behavior is normal.    BP 136/78   Pulse 81   Temp 98.1 F (36.7 C) (Oral)   Ht 5' 9"  (1.753 m)   Wt 173 lb 6.4 oz (78.7 kg)   SpO2 98%   BMI 25.61 kg/m  Wt Readings from Last 3 Encounters:  02/24/16 170 lb 12.8 oz (77.5 kg)  02/23/16 173 lb 6.4 oz (78.7 kg)  02/18/16 170 lb 12.8 oz (77.5 kg)     Lab Results  Component Value Date   WBC 4.8 02/17/2016   HGB 10.4 (L) 02/17/2016   HCT 30.0 (L) 02/17/2016   PLT 115 (L) 02/17/2016   GLUCOSE 247 (H) 02/24/2016   CHOL 154 12/17/2015   TRIG 266.0 (H) 12/17/2015   HDL 25.80 (L) 12/17/2015   LDLDIRECT 56.0 12/17/2015   LDLCALC 51 05/14/2013   ALT 25 02/17/2016   AST 31 02/17/2016   NA 135 02/24/2016   K 4.6 02/24/2016   CL 106 02/24/2016   CREATININE 1.30 (H) 02/24/2016   BUN 31 (H) 02/24/2016   CO2 21 (L) 02/24/2016   TSH 6.49 (H) 12/17/2015   INR 1.1 (H) 08/25/2015   HGBA1C 6.8 (H) 12/17/2015   MICROALBUR 22.4 (H) 04/29/2015       Assessment & Plan:   Problem List Items Addressed This Visit    CAD (coronary artery disease)    Stable.        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   Had issues with ascites.  On lasix and aldactone.  Doing well on adjusted dose  - so far.  Follow.        Diabetes ([place])    Sugars overall appear to be improving.  Recheck kidney function today.  Will continue metformin for now.  Follow.  Will instruct his wife on giving short acting insulin to have as needed.        Relevant Medications   insulin lispro (HUMALOG) 100 UNIT/ML injection   Essential hypertension, benign    Blood pressure as outlined.  Follow.  On 1/2 spironolactone and lasix now.  Follow.        Hypercholesterolemia    Follow lipid panel.  Not taking statin medication given liver issues.        Iron deficiency anemia    Continue IV iron infusions.  Followed by hematology.       Multiple myeloma ([place])    Followed by hematology.  Planning for f/u bone marrow biopsy.        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  Previously on nadolol.  Off now.  Being followed by GI.  Was started on aldactone and lasix.  See last note.  Decreased dose of each.  Feeling some better.  Not as dizzy.  Follow for increased ascites.        Secondary esophageal varices without bleeding ([facility])    Off nadolol.  Followed by Dr Allen Norris.  Planning for f/u EGD this week.        Thrombocytopenia ([place])    Followed by hematology.        Other Visit Diagnoses    Renal insufficiency    -  Primary   Relevant Orders   Basic metabolic panel (Completed)     I spent 45 minutes with the patient and more than 50% of the time was spent in consultation regarding the above.  Time spent discussing current medical issues and previous w/up.  Also discussed current treatment and plans for further w/up and evaluation.     Einar Pheasant, MD

## 2016-02-23 NOTE — Telephone Encounter (Signed)
Can we change medication?

## 2016-02-24 ENCOUNTER — Other Ambulatory Visit
Admission: RE | Admit: 2016-02-24 | Discharge: 2016-02-24 | Disposition: A | Discharge status: Home / Self Care | Payer: PPO | Source: Ambulatory Visit | Attending: Gastroenterology | Admitting: Gastroenterology

## 2016-02-24 ENCOUNTER — Encounter
Admission: RE | Disposition: A | Discharge status: Home / Self Care | Payer: Self-pay | Source: Ambulatory Visit | Attending: Gastroenterology

## 2016-02-24 ENCOUNTER — Ambulatory Visit
Admission: RE | Admit: 2016-02-24 | Discharge: 2016-02-24 | Disposition: A | Discharge status: Home / Self Care | Payer: PPO | Source: Ambulatory Visit | Attending: Gastroenterology | Admitting: Gastroenterology

## 2016-02-24 ENCOUNTER — Ambulatory Visit: Payer: PPO | Admitting: Student in an Organized Health Care Education/Training Program

## 2016-02-24 DIAGNOSIS — K7460 Unspecified cirrhosis of liver: Secondary | ICD-10-CM | POA: Diagnosis not present

## 2016-02-24 DIAGNOSIS — Z888 Allergy status to other drugs, medicaments and biological substances status: Secondary | ICD-10-CM | POA: Diagnosis not present

## 2016-02-24 DIAGNOSIS — N289 Disorder of kidney and ureter, unspecified: Secondary | ICD-10-CM | POA: Insufficient documentation

## 2016-02-24 DIAGNOSIS — D500 Iron deficiency anemia secondary to blood loss (chronic): Secondary | ICD-10-CM

## 2016-02-24 DIAGNOSIS — Z8249 Family history of ischemic heart disease and other diseases of the circulatory system: Secondary | ICD-10-CM | POA: Insufficient documentation

## 2016-02-24 DIAGNOSIS — I8500 Esophageal varices without bleeding: Secondary | ICD-10-CM | POA: Diagnosis not present

## 2016-02-24 DIAGNOSIS — Z8579 Personal history of other malignant neoplasms of lymphoid, hematopoietic and related tissues: Secondary | ICD-10-CM | POA: Insufficient documentation

## 2016-02-24 DIAGNOSIS — I252 Old myocardial infarction: Secondary | ICD-10-CM | POA: Insufficient documentation

## 2016-02-24 DIAGNOSIS — Z833 Family history of diabetes mellitus: Secondary | ICD-10-CM | POA: Diagnosis not present

## 2016-02-24 DIAGNOSIS — I2510 Atherosclerotic heart disease of native coronary artery without angina pectoris: Secondary | ICD-10-CM | POA: Diagnosis not present

## 2016-02-24 DIAGNOSIS — I8510 Secondary esophageal varices without bleeding: Secondary | ICD-10-CM | POA: Insufficient documentation

## 2016-02-24 DIAGNOSIS — N400 Enlarged prostate without lower urinary tract symptoms: Secondary | ICD-10-CM | POA: Insufficient documentation

## 2016-02-24 DIAGNOSIS — E7800 Pure hypercholesterolemia, unspecified: Secondary | ICD-10-CM | POA: Diagnosis not present

## 2016-02-24 DIAGNOSIS — D509 Iron deficiency anemia, unspecified: Secondary | ICD-10-CM | POA: Insufficient documentation

## 2016-02-24 DIAGNOSIS — K317 Polyp of stomach and duodenum: Secondary | ICD-10-CM | POA: Diagnosis not present

## 2016-02-24 DIAGNOSIS — Z794 Long term (current) use of insulin: Secondary | ICD-10-CM | POA: Diagnosis not present

## 2016-02-24 DIAGNOSIS — E119 Type 2 diabetes mellitus without complications: Secondary | ICD-10-CM | POA: Insufficient documentation

## 2016-02-24 DIAGNOSIS — K648 Other hemorrhoids: Secondary | ICD-10-CM | POA: Diagnosis not present

## 2016-02-24 DIAGNOSIS — Z86010 Personal history of colonic polyps: Secondary | ICD-10-CM | POA: Diagnosis not present

## 2016-02-24 DIAGNOSIS — I10 Essential (primary) hypertension: Secondary | ICD-10-CM | POA: Insufficient documentation

## 2016-02-24 DIAGNOSIS — Z79899 Other long term (current) drug therapy: Secondary | ICD-10-CM | POA: Diagnosis not present

## 2016-02-24 HISTORY — DX: Hyperglycemia, unspecified: R73.9

## 2016-02-24 HISTORY — PX: ESOPHAGOGASTRODUODENOSCOPY (EGD) WITH PROPOFOL: SHX5813

## 2016-02-24 LAB — BASIC METABOLIC PANEL
Anion gap: 8 (ref 5–15)
BUN: 31 mg/dL — AB (ref 6–20)
CALCIUM: 10 mg/dL (ref 8.9–10.3)
CO2: 21 mmol/L — AB (ref 22–32)
CREATININE: 1.3 mg/dL — AB (ref 0.61–1.24)
Chloride: 106 mmol/L (ref 101–111)
GFR calc Af Amer: 60 mL/min — ABNORMAL LOW (ref >60)
GFR calc non Af Amer: 52 mL/min — ABNORMAL LOW (ref >60)
GLUCOSE: 247 mg/dL — AB (ref 65–99)
Potassium: 4.6 mmol/L (ref 3.5–5.1)
Sodium: 135 mmol/L (ref 135–145)

## 2016-02-24 LAB — GLUCOSE, CAPILLARY
GLUCOSE-CAPILLARY: 244 mg/dL — AB (ref 65–99)
Glucose-Capillary: 242 mg/dL — ABNORMAL HIGH (ref 65–99)

## 2016-02-24 SURGERY — ESOPHAGOGASTRODUODENOSCOPY (EGD) WITH PROPOFOL
Anesthesia: Monitor Anesthesia Care | Wound class: Clean Contaminated

## 2016-02-24 MED ORDER — LACTATED RINGERS IV SOLN
INTRAVENOUS | Status: DC
  Administered 2016-02-24: 09:00:00 via INTRAVENOUS

## 2016-02-24 MED ORDER — STERILE WATER FOR IRRIGATION IR SOLN
Status: DC | PRN
  Administered 2016-02-24: 09:00:00

## 2016-02-24 MED ORDER — LIDOCAINE HCL (CARDIAC) 20 MG/ML IV SOLN
INTRAVENOUS | Status: DC | PRN
  Administered 2016-02-24: 50 mg via INTRAVENOUS

## 2016-02-24 MED ORDER — PROPOFOL 10 MG/ML IV BOLUS
INTRAVENOUS | Status: DC | PRN
  Administered 2016-02-24: 50 mg via INTRAVENOUS
  Administered 2016-02-24: 100 mg via INTRAVENOUS

## 2016-02-24 SURGICAL SUPPLY — 32 items
BALLN DILATOR 10-12 8 (BALLOONS)
BALLN DILATOR 12-15 8 (BALLOONS)
BALLN DILATOR 15-18 8 (BALLOONS)
BALLN DILATOR CRE 0-12 8 (BALLOONS)
BALLN DILATOR ESOPH 8 10 CRE (MISCELLANEOUS) IMPLANT
BALLOON DILATOR 12-15 8 (BALLOONS) IMPLANT
BALLOON DILATOR 15-18 8 (BALLOONS) IMPLANT
BALLOON DILATOR CRE 0-12 8 (BALLOONS) IMPLANT
BLOCK BITE 60FR ADLT L/F GRN (MISCELLANEOUS) ×3 IMPLANT
CANISTER SUCT 1200ML W/VALVE (MISCELLANEOUS) ×3 IMPLANT
CLIP HMST 235XBRD CATH ROT (MISCELLANEOUS) IMPLANT
CLIP RESOLUTION 360 11X235 (MISCELLANEOUS)
FCP ESCP3.2XJMB 240X2.8X (MISCELLANEOUS)
FORCEPS BIOP RAD 4 LRG CAP 4 (CUTTING FORCEPS) IMPLANT
FORCEPS BIOP RJ4 240 W/NDL (MISCELLANEOUS)
FORCEPS ESCP3.2XJMB 240X2.8X (MISCELLANEOUS) IMPLANT
GOWN CVR UNV OPN BCK APRN NK (MISCELLANEOUS) ×2 IMPLANT
GOWN ISOL THUMB LOOP REG UNIV (MISCELLANEOUS) ×4
INJECTOR VARIJECT VIN23 (MISCELLANEOUS) IMPLANT
KIT DEFENDO VALVE AND CONN (KITS) IMPLANT
KIT ENDO PROCEDURE OLY (KITS) ×3 IMPLANT
MARKER SPOT ENDO TATTOO 5ML (MISCELLANEOUS) IMPLANT
PAD GROUND ADULT SPLIT (MISCELLANEOUS) IMPLANT
RETRIEVER NET PLAT FOOD (MISCELLANEOUS) IMPLANT
SNARE SHORT THROW 13M SML OVAL (MISCELLANEOUS) IMPLANT
SNARE SHORT THROW 30M LRG OVAL (MISCELLANEOUS) IMPLANT
SPOT EX ENDOSCOPIC TATTOO (MISCELLANEOUS)
SYR INFLATION 60ML (SYRINGE) IMPLANT
TRAP ETRAP POLY (MISCELLANEOUS) IMPLANT
VARIJECT INJECTOR VIN23 (MISCELLANEOUS)
WATER STERILE IRR 250ML POUR (IV SOLUTION) ×3 IMPLANT
WIRE CRE 18-20MM 8CM F G (MISCELLANEOUS) IMPLANT

## 2016-02-24 NOTE — Telephone Encounter (Signed)
Called and spoke with wife (Zachary Buck).  EGD went well.  Pt doing better.  Feels better.  Sugar better.  Will hold on insulin.

## 2016-02-24 NOTE — H&P (Signed)
[name], MD [facility] [address] [place], [place] [address] Phone: [phone] Fax : [phone]  Primary Care Physician:  Einar Pheasant, MD Primary Gastroenterologist:  Dr. Allen Norris  Pre-Procedure History & Physical: HPI:  Zachary Buck is a 76 y.o. male is here for an endoscopy.   Past Medical History:  Diagnosis Date  . Anemia   . Atherosclerotic heart disease    s/p MI, s/p stent mid circumflex  . CAD (coronary artery disease)    Dr Nehemiah Massed, cardiologist  . Cirrhosis of liver ([place])   . Clavicle fracture   . Diabetes mellitus without complication ([place])    oral med  . Elevated blood sugar    02/03/16 and 02/09/16 had BS over 400.  otherwise running around 250  . Esophageal varices ([place])   . Hepatic encephalopathy ([place])   . Hypercholesterolemia   . Hypertension    controlled on meds  . Internal hemorrhoids   . Leg fracture   . Multiple myeloma ([place])   . Myocardial infarct   . Prostate enlargement   . Tubular adenoma of colon     Past Surgical History:  Procedure Laterality Date  . COLONOSCOPY WITH PROPOFOL N/A 03/04/2015   Procedure: COLONOSCOPY WITH PROPOFOL;  Surgeon: Jerene Bears, MD;  [facility];  Service: Gastroenterology;  Laterality: N/A;  . COLONOSCOPY WITH PROPOFOL N/A 12/09/2015   Procedure: COLONOSCOPY WITH PROPOFOL;  Surgeon: [name], MD;  Location: [place];  Service: Endoscopy;  Laterality: N/A;  DIABETIC-ORAL MEDS  . CORONARY ANGIOPLASTY WITH STENT PLACEMENT     2 stents  . ESOPHAGOGASTRODUODENOSCOPY (EGD) WITH PROPOFOL N/A 03/04/2015   Procedure: ESOPHAGOGASTRODUODENOSCOPY (EGD) WITH PROPOFOL;  Surgeon: Jerene Bears, MD;  [facility];  Service: Gastroenterology;  Laterality: N/A;  . ESOPHAGOGASTRODUODENOSCOPY (EGD) WITH PROPOFOL N/A 12/09/2015   Procedure: ESOPHAGOGASTRODUODENOSCOPY (EGD) WITH PROPOFOL;  Surgeon: [name], MD;  Location: [facility];  Service: Endoscopy;   Laterality: N/A;  . ESOPHAGOGASTRODUODENOSCOPY (EGD) WITH PROPOFOL N/A 12/21/2015   Procedure: ESOPHAGOGASTRODUODENOSCOPY (EGD) WITH PROPOFOL;  Surgeon: [name], MD;  Location: [facility];  Service: Endoscopy;  Laterality: N/A;  . POLYPECTOMY N/A 12/09/2015   Procedure: POLYPECTOMY;  Surgeon: [name], MD;  Location: [place];  Service: Endoscopy;  Laterality: N/A;  . TONSILLECTOMY      Prior to Admission medications   Medication Sig Start Date End Date Taking? Authorizing Provider  furosemide (LASIX) 20 MG tablet Take 1 tablet (20 mg total) by mouth 2 (two) times daily. 01/03/16  Yes Sabine Tenenbaum Allen Norris, MD  glipiZIDE (GLUCOTROL) 5 MG tablet TAKE 1 TABLET BY MOUTH EVERY MORNING AND 2 TABLETS EVERY EVENING 10/18/15  Yes Einar Pheasant, MD  metFORMIN (GLUCOPHAGE) 1000 MG tablet TAKE 1 TABLET(1000 MG) BY MOUTH TWICE DAILY WITH A MEAL 08/12/15  Yes Einar Pheasant, MD  metFORMIN (GLUCOPHAGE) 1000 MG tablet TAKE 1 TABLET(1000 MG) BY MOUTH TWICE DAILY WITH A MEAL 02/04/16  Yes Einar Pheasant, MD  spironolactone (ALDACTONE) 50 MG tablet Take 1 tablet (50 mg total) by mouth daily. 01/03/16  Yes Keshan Reha Allen Norris, MD  Alpha-Lipoic Acid (LIPOIC ACID PO) Take 200 mg by mouth daily.     Historical Provider, MD  Ascorbic Acid (VITAMIN C) 1000 MG tablet Take 1,000 mg by mouth daily.    Historical Provider, MD  B Complex Vitamins (VITAMIN B COMPLEX) TABS Take by mouth daily.    Historical Provider, MD  blood glucose meter kit and supplies KIT Dispense based on patient and  insurance preference. Use up to four times daily as directed. (FOR ICD-9 250.00, 250.01). 03/16/14   Einar Pheasant, MD  blood glucose meter kit and supplies KIT Dispense based on patient and insurance preference. Use up to four times daily as directed. E11.9 Please dispense Freestyle freedom meter and supplies. 09/09/15   Coral Spikes, DO  Cholecalciferol (VITAMIN D-3) 1000 UNITS CAPS Take by mouth.    Historical Provider, MD  CHROMIUM  PO Take 600 mg by mouth daily.     Historical Provider, MD  Cinnamon 500 MG capsule Take 500 mg by mouth.    Historical Provider, MD  Coenzyme Q10 100 MG capsule Take by mouth.    Historical Provider, MD  Ginger, Zingiber officinalis, (GINGER ROOT) 550 MG CAPS Take by mouth daily.    Historical Provider, MD  glucose blood (BAYER CONTOUR NEXT TEST) test strip Contour Next EZ test strips: check blood sugars twice a day. Dx: 250.00 05/07/13   Historical Provider, MD  glucose blood (ONE TOUCH ULTRA TEST) test strip Use as instructed 11/18/15   Einar Pheasant, MD  insulin lispro (HUMALOG) 100 UNIT/ML injection Use sliding scale as directed.  (i.e., if blood sugar 251-300 - give 2 units, etc as outlined. Patient not taking: Reported on 02/24/2016 02/23/16   Einar Pheasant, MD  Insulin Syringe-Needle U-100 (INSULIN SYRINGE .5CC/31GX5/16") 31G X 5/16" 0.5 ML MISC Will be using a SSI - for elevated blood sugars. 02/23/16   Einar Pheasant, MD  Lancets (ONETOUCH ULTRASOFT) lancets Use as instructed 03/16/14   Einar Pheasant, MD  lisinopril (PRINIVIL,ZESTRIL) 20 MG tablet TAKE ONE TABLET BY MOUTH EVERY DAY/ AM 01/11/15   Historical Provider, MD  Magnesium 500 MG CAPS Take 500 mg by mouth daily.    Historical Provider, MD  Omega-3 Fatty Acids (FISH OIL) 1000 MG CAPS Take 4,000 mg by mouth daily.     Historical Provider, MD  [place]    Historical Provider, MD  OVER THE COUNTER MEDICATION 250 MG CITICOLINE    Historical Provider, MD  OVER THE COUNTER MEDICATION     Historical Provider, MD  OVER THE COUNTER MEDICATION     Historical Provider, MD  OVER THE COUNTER MEDICATION     Historical Provider, MD  OVER THE COUNTER MEDICATION     Historical Provider, MD  SBI/Protein Isolate (ENTERAGAM) 5 g PACK Take 1 packet by mouth daily. 07/26/15   Lequita Asal, MD  sildenafil (REVATIO) 20 MG tablet 3 to 5 tablets per day or as instructed 09/01/15   Historical Provider, MD  Theanine 100 MG CAPS  Take by mouth daily.    Historical Provider, MD  triamcinolone cream (KENALOG) 0.1 % Apply topically 2 (two) times daily. Please avoid face and genitalia area.  Do not use in the same spot for more than 7-10 days in a row. 02/05/16   Einar Pheasant, MD  Turmeric POWD 4,000 mg by Does not apply route daily.    Historical Provider, MD  vitamin B-12 (CYANOCOBALAMIN) 100 MCG tablet Take 200 mcg by mouth daily.    Historical Provider, MD    Allergies as of 01/04/2016 - Review Complete 01/03/2016  Allergen Reaction Noted  . Niaspan [niacin er] Other (See Comments) 03/19/2012    Family History  Problem Relation Age of Onset  . Aneurysm Father 24    of the heart  . Heart attack Father   . Diabetes Mother     Social History   Social History  .  Marital status: Married    Spouse name: N/A  . Number of children: 2  . Years of education: N/A   Occupational History  . Estate manager/land agent   Social History Main Topics  . Smoking status: Never Smoker  . Smokeless tobacco: Never Used  . Alcohol use No  . Drug use: No  . Sexual activity: Not on file   Other Topics Concern  . Not on file   Social History Narrative  . No narrative on file    Review of Systems: See HPI, otherwise negative ROS  Physical Exam: BP (!) 145/77   Temp 98.9 F (37.2 C) (Temporal)   Ht 5' 9"  (1.753 m)   Wt 170 lb 12.8 oz (77.5 kg)   SpO2 99%   BMI 25.22 kg/m  General:   Alert,  pleasant and cooperative in NAD Head:  Normocephalic and atraumatic. Neck:  Supple; no masses or thyromegaly. Lungs:  Clear throughout to auscultation.    Heart:  Regular rate and rhythm. Abdomen:  Soft, nontender and nondistended. Normal bowel sounds, without guarding, and without rebound.   Neurologic:  Alert and  oriented x4;  grossly normal neurologically.  Impression/Plan: Zachary Buck is here for an endoscopy to be performed for varices and anemia  Risks, benefits, limitations, and alternatives regarding   endoscopy have been reviewed with the patient.  Questions have been answered.  All parties agreeable.   [name], MD  02/24/2016, 8:18 AM

## 2016-02-24 NOTE — Telephone Encounter (Signed)
Pt spouse called back and was looking for an update on medication. Was asked to be called when completed.   Pharmacy - [facility] [place], [place]  Call pt @ [phone]

## 2016-02-24 NOTE — Transfer of Care (Signed)
Immediate Anesthesia Transfer of Care Note  Patient: Zachary Buck  Procedure(s) Performed: Procedure(s) with comments: ESOPHAGOGASTRODUODENOSCOPY (EGD) WITH PROPOFOL (N/A) - diabetic - oral med  Patient Location: PACU  Anesthesia Type: MAC  Level of Consciousness: awake, alert  and patient cooperative  Airway and Oxygen Therapy: Patient Spontanous Breathing and Patient connected to supplemental oxygen  Post-op Assessment: Post-op Vital signs reviewed, Patient's Cardiovascular Status Stable, Respiratory Function Stable, Patent Airway and No signs of Nausea or vomiting  Post-op Vital Signs: Reviewed and stable  Complications: No apparent anesthesia complications

## 2016-02-24 NOTE — Anesthesia Postprocedure Evaluation (Signed)
Anesthesia Post Note  Patient: Zachary Buck  Procedure(s) Performed: Procedure(s) (LRB): ESOPHAGOGASTRODUODENOSCOPY (EGD) WITH PROPOFOL (N/A)  Patient location during evaluation: PACU Anesthesia Type: MAC Level of consciousness: awake and alert Pain management: pain level controlled Vital Signs Assessment: post-procedure vital signs reviewed and stable Respiratory status: spontaneous breathing, nonlabored ventilation, respiratory function stable and patient connected to nasal cannula oxygen Cardiovascular status: stable and blood pressure returned to baseline Anesthetic complications: no    Anias Bartol C

## 2016-02-24 NOTE — Anesthesia Preprocedure Evaluation (Signed)
Anesthesia Evaluation    Airway Mallampati: II  TM Distance: >3 FB Neck ROM: Full    Dental no notable dental hx.    Pulmonary neg pulmonary ROS,    Pulmonary exam normal breath sounds clear to auscultation       Cardiovascular hypertension, + CAD and + Past MI  Normal cardiovascular exam Rhythm:Regular Rate:Normal  No symptoms of CAD.  Stable. HYpercholesterolemia   Neuro/Psych negative neurological ROS     GI/Hepatic negative GI ROS, (+) Cirrhosis   Esophageal Varices    ,   Endo/Other  diabetes, Poorly Controlled, Type 2  Renal/GU Renal disease  negative genitourinary   Musculoskeletal  (+) Arthritis ,   Abdominal   Peds negative pediatric ROS (+)  Hematology  (+) Blood dyscrasia, anemia , Multiple myeloma   Anesthesia Other Findings   Reproductive/Obstetrics negative OB ROS                             Anesthesia Physical Anesthesia Plan  ASA: III  Anesthesia Plan: MAC   Post-op Pain Management:    Induction: Intravenous  Airway Management Planned:   Additional Equipment:   Intra-op Plan:   Post-operative Plan: Extubation in OR  Informed Consent: I have reviewed the patients History and Physical, chart, labs and discussed the procedure including the risks, benefits and alternatives for the proposed anesthesia with the patient or authorized representative who has indicated his/her understanding and acceptance.   Dental advisory given  Plan Discussed with: CRNA  Anesthesia Plan Comments:         Anesthesia Quick Evaluation

## 2016-02-24 NOTE — Telephone Encounter (Signed)
Notify pharmacy, ok to change to novolog insulin.  Same instructions - with sliding scale insulin.

## 2016-02-24 NOTE — Op Note (Signed)
[facility] Gastroenterology Patient Name: Zachary Buck Procedure Date: 02/24/2016 8:19 AM MRN: [id number] Account #: [id number] Date of Birth: 07/06/1940 Admit Type: Outpatient Age: 76 Room: [facility] OR ROOM 01 Gender: Male Note Status: Finalized Procedure:            Upper GI endoscopy Indications:          Iron deficiency anemia Providers:            [name] MD, MD Referring MD:         Einar Pheasant, MD (Referring MD) Medicines:            Propofol per Anesthesia Complications:        No immediate complications. Procedure:            Pre-Anesthesia Assessment:                       - Prior to the procedure, a History and Physical was                        performed, and patient medications and allergies were                        reviewed. The patient's tolerance of previous                        anesthesia was also reviewed. The risks and benefits of                        the procedure and the sedation options and risks were                        discussed with the patient. All questions were                        answered, and informed consent was obtained. Prior                        Anticoagulants: The patient has taken no previous                        anticoagulant or antiplatelet agents. ASA Grade                        Assessment: II - A patient with mild systemic disease.                        After reviewing the risks and benefits, the patient was                        deemed in satisfactory condition to undergo the                        procedure.                       After obtaining informed consent, the endoscope was                        passed under direct vision. Throughout the procedure,  the patient's blood pressure, pulse, and oxygen                        saturations were monitored continuously. The Olympus                        GIF H180J colonscope [id number]) was introduced        through the mouth, and advanced to the second part of                        duodenum. The upper GI endoscopy was accomplished                        without difficulty. The patient tolerated the procedure                        well. Findings:      No gross lesions were noted in the lower third of the esophagus.      A single 12 mm sessile polyp with no bleeding and no stigmata of recent       bleeding was found in the gastric antrum.      One 5 mm sessile polyp with no bleeding was found in the second portion       of the duodenum. Polypectomy was not attempted. Impression:           - No gross lesions in esophagus.                       - A single gastric polyp.                       - One duodenal polyp. Resection not attempted.                       - No specimens collected. Recommendation:       - Discharge patient to home.                       - Resume previous diet.                       - Continue present medications. Procedure Code(s):    --- Professional ---                       [phone], Esophagogastroduodenoscopy, flexible, transoral;                        diagnostic, including collection of specimen(s) by                        brushing or washing, when performed (separate procedure) Diagnosis Code(s):    --- Professional ---                       D50.9, Iron deficiency anemia, unspecified                       K31.7, Polyp of stomach and duodenum CPT copyright 2016 [employer]. All rights reserved. The codes documented in this report are preliminary and upon coder review may  be revised to meet current compliance requirements. [name] MD,  MD 02/24/2016 9:28:48 AM This report has been signed electronically. Number of Addenda: 0 Note Initiated On: 02/24/2016 8:19 AM Total Procedure Duration: 0 hours 4 minutes 46 seconds       [facility]

## 2016-02-24 NOTE — Telephone Encounter (Signed)
Called pharmacy and changed to Novolog verbally. Notified patients wife and she verbalized understanding. She also stated that husband came through procedure fine. Potassium was at a 4.8 with lab recheck this AM

## 2016-02-24 NOTE — Anesthesia Procedure Notes (Signed)
Procedure Name: MAC Date/Time: 02/24/2016 9:16 AM Performed by: Cameron Ali Pre-anesthesia Checklist: Patient identified, Emergency Drugs available, Suction available, Timeout performed and Patient being monitored Patient Re-evaluated:Patient Re-evaluated prior to inductionOxygen Delivery Method: Nasal cannula Placement Confirmation: positive ETCO2

## 2016-02-25 ENCOUNTER — Inpatient Hospital Stay: Payer: PPO | Attending: Hematology and Oncology

## 2016-02-25 ENCOUNTER — Encounter: Payer: Self-pay | Admitting: Gastroenterology

## 2016-02-25 ENCOUNTER — Ambulatory Visit (INDEPENDENT_AMBULATORY_CARE_PROVIDER_SITE_OTHER): Payer: PPO

## 2016-02-25 VITALS — BP 152/76 | HR 78 | Temp 97.1°F | Resp 20

## 2016-02-25 DIAGNOSIS — C9000 Multiple myeloma not having achieved remission: Secondary | ICD-10-CM | POA: Diagnosis not present

## 2016-02-25 DIAGNOSIS — I8510 Secondary esophageal varices without bleeding: Secondary | ICD-10-CM | POA: Diagnosis not present

## 2016-02-25 DIAGNOSIS — I252 Old myocardial infarction: Secondary | ICD-10-CM | POA: Insufficient documentation

## 2016-02-25 DIAGNOSIS — Z7984 Long term (current) use of oral hypoglycemic drugs: Secondary | ICD-10-CM | POA: Diagnosis not present

## 2016-02-25 DIAGNOSIS — K7469 Other cirrhosis of liver: Secondary | ICD-10-CM | POA: Insufficient documentation

## 2016-02-25 DIAGNOSIS — Z77090 Contact with and (suspected) exposure to asbestos: Secondary | ICD-10-CM | POA: Diagnosis not present

## 2016-02-25 DIAGNOSIS — K766 Portal hypertension: Secondary | ICD-10-CM | POA: Diagnosis not present

## 2016-02-25 DIAGNOSIS — Z86010 Personal history of colonic polyps: Secondary | ICD-10-CM | POA: Insufficient documentation

## 2016-02-25 DIAGNOSIS — K648 Other hemorrhoids: Secondary | ICD-10-CM | POA: Diagnosis not present

## 2016-02-25 DIAGNOSIS — E7800 Pure hypercholesterolemia, unspecified: Secondary | ICD-10-CM | POA: Insufficient documentation

## 2016-02-25 DIAGNOSIS — N400 Enlarged prostate without lower urinary tract symptoms: Secondary | ICD-10-CM | POA: Diagnosis not present

## 2016-02-25 DIAGNOSIS — Z79899 Other long term (current) drug therapy: Secondary | ICD-10-CM | POA: Insufficient documentation

## 2016-02-25 DIAGNOSIS — J849 Interstitial pulmonary disease, unspecified: Secondary | ICD-10-CM | POA: Insufficient documentation

## 2016-02-25 DIAGNOSIS — K760 Fatty (change of) liver, not elsewhere classified: Secondary | ICD-10-CM | POA: Diagnosis not present

## 2016-02-25 DIAGNOSIS — E119 Type 2 diabetes mellitus without complications: Secondary | ICD-10-CM | POA: Insufficient documentation

## 2016-02-25 DIAGNOSIS — D509 Iron deficiency anemia, unspecified: Secondary | ICD-10-CM | POA: Insufficient documentation

## 2016-02-25 DIAGNOSIS — D696 Thrombocytopenia, unspecified: Secondary | ICD-10-CM | POA: Diagnosis not present

## 2016-02-25 DIAGNOSIS — I2510 Atherosclerotic heart disease of native coronary artery without angina pectoris: Secondary | ICD-10-CM | POA: Insufficient documentation

## 2016-02-25 DIAGNOSIS — D500 Iron deficiency anemia secondary to blood loss (chronic): Secondary | ICD-10-CM

## 2016-02-25 DIAGNOSIS — I10 Essential (primary) hypertension: Secondary | ICD-10-CM | POA: Diagnosis not present

## 2016-02-25 MED ORDER — IRON SUCROSE 20 MG/ML IV SOLN
200.0000 mg | Freq: Once | INTRAVENOUS | Status: AC
  Administered 2016-02-25: 200 mg via INTRAVENOUS
  Filled 2016-02-25: qty 10

## 2016-02-25 MED ORDER — SODIUM CHLORIDE 0.9 % IV SOLN
Freq: Once | INTRAVENOUS | Status: AC
  Administered 2016-02-25: 14:00:00 via INTRAVENOUS
  Filled 2016-02-25: qty 1000

## 2016-02-25 NOTE — Progress Notes (Signed)
Patients wife(DRP) comes in for insulin teaching. She brings in insulin and insulin syringes.  Patient wasn't present due to he just had iron infusion.   I instructed her on how to draw up the insulin and to use different syringe each time.   She was shown where to inject the insulin and to alternate the sites to administer insulin each time.  She was also instructed on how to dispose of syringes. We also reviewed the insulin sliding  schedule.    She verbalized an understanding.    Reviewed. Given pts sugar is better, she will hold on administering the insulin at this time.  Will call if sugars elevate again.  Dr Nicki Reaper

## 2016-02-27 ENCOUNTER — Encounter: Payer: Self-pay | Admitting: Internal Medicine

## 2016-02-27 NOTE — Assessment & Plan Note (Signed)
Followed by hematology 

## 2016-02-27 NOTE — Assessment & Plan Note (Signed)
Followed by hematology.  Planning for f/u bone marrow biopsy.

## 2016-02-27 NOTE — Assessment & Plan Note (Signed)
Continue IV iron infusions.  Followed by hematology.

## 2016-02-27 NOTE — Assessment & Plan Note (Signed)
Follow lipid panel.  Not taking statin medication given liver issues.

## 2016-02-27 NOTE — Assessment & Plan Note (Signed)
Off nadolol.  Followed by Dr Allen Norris.  Planning for f/u EGD this week.

## 2016-02-27 NOTE — Assessment & Plan Note (Signed)
Sugars overall appear to be improving.  Recheck kidney function today.  Will continue metformin for now.  Follow.  Will instruct his wife on giving short acting insulin to have as needed.

## 2016-02-27 NOTE — Assessment & Plan Note (Signed)
Followed by GI.  Had issues with ascites.  On lasix and aldactone.  Doing well on adjusted dose  - so far.  Follow.

## 2016-02-27 NOTE — Assessment & Plan Note (Signed)
Has cryptogenic cirrhosis with portal hypertension.  Previously on nadolol.  Off now.  Being followed by GI.  Was started on aldactone and lasix.  See last note.  Decreased dose of each.  Feeling some better.  Not as dizzy.  Follow for increased ascites.

## 2016-02-27 NOTE — Assessment & Plan Note (Signed)
Stable

## 2016-02-27 NOTE — Assessment & Plan Note (Signed)
Blood pressure as outlined.  Follow.  On 1/2 spironolactone and lasix now.  Follow.

## 2016-03-01 ENCOUNTER — Telehealth: Payer: Self-pay | Admitting: *Deleted

## 2016-03-01 ENCOUNTER — Telehealth: Payer: Self-pay | Admitting: Gastroenterology

## 2016-03-01 NOTE — Telephone Encounter (Signed)
Please call patients wife regarding lab work ASAP

## 2016-03-02 ENCOUNTER — Other Ambulatory Visit: Payer: Self-pay

## 2016-03-02 DIAGNOSIS — R188 Other ascites: Principal | ICD-10-CM

## 2016-03-02 DIAGNOSIS — K7460 Unspecified cirrhosis of liver: Secondary | ICD-10-CM

## 2016-03-02 NOTE — Telephone Encounter (Signed)
Advised pt's wife per Dr. Allen Norris, He will need his labs checked weekly. Lab order has been done.

## 2016-03-02 NOTE — Telephone Encounter (Signed)
Pt had labs done 7 days ago and pt's wife stated you wanted him to repeat CMP every week. Next labs scheduled for 03/25/16. Do you want this done weekly? Please advise.

## 2016-03-02 NOTE — Telephone Encounter (Signed)
Yes since we recently change his medication he should have his labs checked weekly.

## 2016-03-03 ENCOUNTER — Inpatient Hospital Stay: Payer: PPO

## 2016-03-03 ENCOUNTER — Other Ambulatory Visit
Admission: RE | Admit: 2016-03-03 | Discharge: 2016-03-03 | Disposition: A | Discharge status: Home / Self Care | Payer: PPO | Source: Ambulatory Visit | Attending: Gastroenterology | Admitting: Gastroenterology

## 2016-03-03 VITALS — BP 128/77 | HR 90 | Temp 97.8°F | Resp 18

## 2016-03-03 DIAGNOSIS — C9000 Multiple myeloma not having achieved remission: Secondary | ICD-10-CM | POA: Diagnosis not present

## 2016-03-03 DIAGNOSIS — K7460 Unspecified cirrhosis of liver: Secondary | ICD-10-CM | POA: Insufficient documentation

## 2016-03-03 DIAGNOSIS — D500 Iron deficiency anemia secondary to blood loss (chronic): Secondary | ICD-10-CM

## 2016-03-03 LAB — COMPREHENSIVE METABOLIC PANEL
ALBUMIN: 3.7 g/dL (ref 3.5–5.0)
ALK PHOS: 72 U/L (ref 38–126)
ALT: 26 U/L (ref 17–63)
ANION GAP: 8 (ref 5–15)
AST: 31 U/L (ref 15–41)
BUN: 29 mg/dL — ABNORMAL HIGH (ref 6–20)
CO2: 22 mmol/L (ref 22–32)
Calcium: 10.4 mg/dL — ABNORMAL HIGH (ref 8.9–10.3)
Chloride: 105 mmol/L (ref 101–111)
Creatinine, Ser: 1.43 mg/dL — ABNORMAL HIGH (ref 0.61–1.24)
GFR calc Af Amer: 53 mL/min — ABNORMAL LOW (ref >60)
GFR calc non Af Amer: 46 mL/min — ABNORMAL LOW (ref >60)
GLUCOSE: 301 mg/dL — AB (ref 65–99)
Potassium: 4.5 mmol/L (ref 3.5–5.1)
SODIUM: 135 mmol/L (ref 135–145)
Total Bilirubin: 1.1 mg/dL (ref 0.3–1.2)
Total Protein: 8.7 g/dL — ABNORMAL HIGH (ref 6.5–8.1)

## 2016-03-03 MED ORDER — SODIUM CHLORIDE 0.9 % IV SOLN
Freq: Once | INTRAVENOUS | Status: AC
  Administered 2016-03-03: 14:00:00 via INTRAVENOUS
  Filled 2016-03-03: qty 1000

## 2016-03-03 MED ORDER — SODIUM CHLORIDE 0.9 % IV SOLN: 200.0000 mg | Freq: Once | INTRAVENOUS | Status: DC

## 2016-03-03 MED ORDER — IRON SUCROSE 20 MG/ML IV SOLN
200.0000 mg | Freq: Once | INTRAVENOUS | Status: AC
  Administered 2016-03-03: 200 mg via INTRAVENOUS
  Filled 2016-03-03: qty 10

## 2016-03-03 NOTE — Telephone Encounter (Signed)
Wife informed that we are waiting on bone marrow to be scheduled and we will calling her with the appt. Voiced understanding.

## 2016-03-05 ENCOUNTER — Encounter: Payer: Self-pay | Admitting: Hematology and Oncology

## 2016-03-06 ENCOUNTER — Telehealth: Payer: Self-pay | Admitting: *Deleted

## 2016-03-06 NOTE — Telephone Encounter (Addendum)
Called patient and LVM that MD is ordering bone marrow biopsy.  A scheduler will be contacting them with that appointment.

## 2016-03-07 ENCOUNTER — Other Ambulatory Visit: Payer: PPO

## 2016-03-07 ENCOUNTER — Telehealth: Payer: Self-pay | Admitting: Gastroenterology

## 2016-03-07 ENCOUNTER — Telehealth: Payer: Self-pay

## 2016-03-07 ENCOUNTER — Encounter: Payer: Self-pay | Admitting: Gastroenterology

## 2016-03-07 ENCOUNTER — Ambulatory Visit: Payer: PPO | Admitting: Hematology and Oncology

## 2016-03-07 ENCOUNTER — Other Ambulatory Visit: Payer: Self-pay

## 2016-03-07 DIAGNOSIS — R188 Other ascites: Principal | ICD-10-CM

## 2016-03-07 DIAGNOSIS — K7460 Unspecified cirrhosis of liver: Secondary | ICD-10-CM

## 2016-03-07 NOTE — Telephone Encounter (Signed)
Left vm for pt to return my call.  

## 2016-03-07 NOTE — Telephone Encounter (Signed)
Pt's wife notified of lab results. Will repeat in 2 weeks. Lab order placed.

## 2016-03-07 NOTE — Telephone Encounter (Signed)
Patient is returning your call.  

## 2016-03-07 NOTE — Telephone Encounter (Signed)
-----   Message from [name], MD sent at 03/05/2016  8:32 AM EST ----- The patient know that his kidney functions increased slightly from 2 weeks ago but are still within a safe range.  Have him check his labs in 2 weeks.

## 2016-03-08 ENCOUNTER — Telehealth: Payer: Self-pay | Admitting: *Deleted

## 2016-03-08 NOTE — Telephone Encounter (Signed)
Called and spoke to Zachary Buck regarding patients bone marrow that is scheduled for 03-14-16.  Patient is to arrive @ 8:00 am for procedure @ 9:00 am.  Instructed patient to be NPO after midnight.  Patient should hold all meds until after procedure.  Lab notified. Verbalized understanding.

## 2016-03-10 ENCOUNTER — Telehealth: Payer: Self-pay | Admitting: Gastroenterology

## 2016-03-10 ENCOUNTER — Inpatient Hospital Stay: Payer: PPO

## 2016-03-10 NOTE — Telephone Encounter (Signed)
Patient wants an appointment Monday so I explained to her that you would call her when the office opens to see if he can come in Monday. He is having trouble.

## 2016-03-10 NOTE — Telephone Encounter (Signed)
Pt has been scheduled for an appt with Dr. Allen Norris on Monday 03/13/16.

## 2016-03-13 ENCOUNTER — Ambulatory Visit (INDEPENDENT_AMBULATORY_CARE_PROVIDER_SITE_OTHER): Payer: PPO | Admitting: Gastroenterology

## 2016-03-13 ENCOUNTER — Other Ambulatory Visit: Payer: Self-pay

## 2016-03-13 ENCOUNTER — Encounter: Payer: Self-pay | Admitting: Gastroenterology

## 2016-03-13 ENCOUNTER — Ambulatory Visit: Payer: PPO | Admitting: Gastroenterology

## 2016-03-13 ENCOUNTER — Other Ambulatory Visit: Payer: Self-pay | Admitting: Radiology

## 2016-03-13 VITALS — BP 143/73 | HR 75 | Temp 97.9°F | Ht 69.0 in | Wt 176.0 lb

## 2016-03-13 DIAGNOSIS — K7460 Unspecified cirrhosis of liver: Secondary | ICD-10-CM | POA: Diagnosis not present

## 2016-03-13 DIAGNOSIS — R188 Other ascites: Principal | ICD-10-CM

## 2016-03-13 DIAGNOSIS — K7290 Hepatic failure, unspecified without coma: Secondary | ICD-10-CM

## 2016-03-13 DIAGNOSIS — K7682 Hepatic encephalopathy: Secondary | ICD-10-CM

## 2016-03-13 MED ORDER — LACTULOSE 10 GM/15ML PO SOLN: 10.0000 g | Freq: Two times a day (BID) | ORAL | 3 refills | Status: DC

## 2016-03-13 NOTE — Progress Notes (Signed)
Primary Care Physician: Einar Pheasant, MD  Primary Gastroenterologist:  Dr. [name]  No chief complaint on file.   HPI: Zachary Buck is a 76 y.o. male here for follow-up of his cirrhosis. The patient was reported to be confused at his last sermon. The patient's wife states that when she stopped the Lasix he started becoming more alert and awake. The patient has been off of it for the last 2 days. The patient's abdomen has been slightly more distended off of the Lasix as reported by his wife but the patient denies it being uncomfortable.  Current Outpatient Prescriptions  Medication Sig Dispense Refill  . Alpha-Lipoic Acid (LIPOIC ACID PO) Take 200 mg by mouth daily.     . Ascorbic Acid (VITAMIN C) 1000 MG tablet Take 1,000 mg by mouth daily.    . B Complex Vitamins (VITAMIN B COMPLEX) TABS Take by mouth daily.    . blood glucose meter kit and supplies KIT Dispense based on patient and insurance preference. Use up to four times daily as directed. (FOR ICD-9 250.00, 250.01). 1 each 0  . blood glucose meter kit and supplies KIT Dispense based on patient and insurance preference. Use up to four times daily as directed. E11.9 Please dispense Freestyle freedom meter and supplies. 1 each 0  . Cholecalciferol (VITAMIN D-3) 1000 UNITS CAPS Take by mouth.    . CHROMIUM PO Take 600 mg by mouth daily.     . Cinnamon 500 MG capsule Take 500 mg by mouth.    . Coenzyme Q10 100 MG capsule Take by mouth.    . furosemide (LASIX) 20 MG tablet Take 1 tablet (20 mg total) by mouth 2 (two) times daily. 60 tablet 3  . Ginger, Zingiber officinalis, (GINGER ROOT) 550 MG CAPS Take by mouth daily.    Marland Kitchen glipiZIDE (GLUCOTROL) 5 MG tablet TAKE 1 TABLET BY MOUTH EVERY MORNING AND 2 TABLETS EVERY EVENING 270 tablet 2  . glucose blood (BAYER CONTOUR NEXT TEST) test strip Contour Next EZ test strips: check blood sugars twice a day. Dx: 250.00    . glucose blood (ONE TOUCH ULTRA TEST) test strip Use as  instructed 100 each 12  . insulin lispro (HUMALOG) 100 UNIT/ML injection Use sliding scale as directed.  (i.e., if blood sugar 251-300 - give 2 units, etc as outlined. 10 mL 1  . Insulin Syringe-Needle U-100 (INSULIN SYRINGE .5CC/31GX5/16") 31G X 5/16" 0.5 ML MISC Will be using a SSI - for elevated blood sugars. 100 each 0  . Lancets (ONETOUCH ULTRASOFT) lancets Use as instructed 100 each 12  . lisinopril (PRINIVIL,ZESTRIL) 20 MG tablet TAKE ONE TABLET BY MOUTH EVERY DAY/ AM    . Magnesium 500 MG CAPS Take 500 mg by mouth daily.    . metFORMIN (GLUCOPHAGE) 1000 MG tablet TAKE 1 TABLET(1000 MG) BY MOUTH TWICE DAILY WITH A MEAL 60 tablet 5  . metFORMIN (GLUCOPHAGE) 1000 MG tablet TAKE 1 TABLET(1000 MG) BY MOUTH TWICE DAILY WITH A MEAL 60 tablet 2  . NOVOLOG 100 UNIT/ML injection USE SLIDING SCALE AS OUTLINED BY PRESCRIBER  1  . Omega-3 Fatty Acids (FISH OIL) 1000 MG CAPS Take 4,000 mg by mouth daily.     Marland Kitchen OVER THE COUNTER MEDICATION BUPLEURUM    . OVER THE COUNTER MEDICATION 250 MG CITICOLINE    . OVER THE COUNTER MEDICATION     . OVER THE COUNTER MEDICATION     . OVER THE COUNTER MEDICATION     .  OVER THE COUNTER MEDICATION     . SBI/Protein Isolate (ENTERAGAM) 5 g PACK Take 1 packet by mouth daily. 30 each 3  . sildenafil (REVATIO) 20 MG tablet 3 to 5 tablets per day or as instructed    . spironolactone (ALDACTONE) 50 MG tablet Take 1 tablet (50 mg total) by mouth daily. 30 tablet 3  . Theanine 100 MG CAPS Take by mouth daily.    Marland Kitchen triamcinolone cream (KENALOG) 0.1 % Apply topically 2 (two) times daily. Please avoid face and genitalia area.  Do not use in the same spot for more than 7-10 days in a row. 80 g 0  . Turmeric POWD 4,000 mg by Does not apply route daily.    . vitamin B-12 (CYANOCOBALAMIN) 100 MCG tablet Take 200 mcg by mouth daily.    Marland Kitchen lactulose (CHRONULAC) 10 GM/15ML solution Take 15 mLs (10 g total) by mouth 2 (two) times daily. Titrate to 2 soft stools per day. Decrease if you  start having diarrhea 946 mL 3   No current facility-administered medications for this visit.    Facility-Administered Medications Ordered in Other Visits  Medication Dose Route Frequency Provider Last Rate Last Dose  . alteplase (CATHFLO ACTIVASE) injection 2 mg  2 mg Intracatheter Once PRN Lequita Asal, MD      . heparin lock flush 100 unit/mL  500 Units Intracatheter Once PRN Lequita Asal, MD      . heparin lock flush 100 unit/mL  250 Units Intracatheter Once PRN Lequita Asal, MD      . sodium chloride 0.9 % injection 10 mL  10 mL Intracatheter PRN Lequita Asal, MD      . sodium chloride 0.9 % injection 3 mL  3 mL Intravenous Once PRN Lequita Asal, MD        Allergies as of 03/13/2016 - Review Complete 03/13/2016  Allergen Reaction Noted  . Niaspan [niacin er] Other (See Comments) 03/19/2012    ROS:  General: Negative for anorexia, weight loss, fever, chills, fatigue, weakness. ENT: Negative for hoarseness, difficulty swallowing , nasal congestion. CV: Negative for chest pain, angina, palpitations, dyspnea on exertion, peripheral edema.  Respiratory: Negative for dyspnea at rest, dyspnea on exertion, cough, sputum, wheezing.  GI: See history of present illness. GU:  Negative for dysuria, hematuria, urinary incontinence, urinary frequency, nocturnal urination.  Endo: Negative for unusual weight change.    Physical Examination:   BP (!) 143/73   Pulse 75   Temp 97.9 F (36.6 C) (Oral)   Ht 5' 9"  (1.753 m)   Wt 176 lb (79.8 kg)   BMI 25.99 kg/m   General: Well-nourished, well-developed in no acute distress.  Eyes: No icterus. Conjunctivae pink. Neuro: Alert and oriented x 3.  Grossly intact. Skin: Warm and dry, no jaundice.   Psych: Alert and cooperative, normal mood and affect.  Labs:    Imaging Studies: No results found.  Assessment and Plan:   Zachary Buck is a 76 y.o. y/o male who has cirrhosis with ascites and what  sounds like hepatic encephalopathy. The patient was given a connect the numbers test today and appears to be scoring around 1-2 grade of hepatic encephalopathy. The patient will be started on lactulose 15 mL twice a day and will titrate that to having 2 bowel movements that are formed a day. She will also titrate the Lasix to see if that helps his hepatic encephalopathy. The patient and his wife have been explained  the plan and agree with it.    [name], MD. [name]   Note: This dictation was prepared with Dragon dictation along with smaller phrase technology. Any transcriptional errors that result from this process are unintentional.

## 2016-03-14 ENCOUNTER — Other Ambulatory Visit: Payer: Self-pay | Admitting: *Deleted

## 2016-03-14 ENCOUNTER — Ambulatory Visit
Admission: RE | Admit: 2016-03-14 | Discharge: 2016-03-14 | Disposition: A | Discharge status: Home / Self Care | Payer: PPO | Source: Ambulatory Visit | Attending: Hematology and Oncology | Admitting: Hematology and Oncology

## 2016-03-14 ENCOUNTER — Other Ambulatory Visit: Payer: Self-pay | Admitting: General Surgery

## 2016-03-14 DIAGNOSIS — Z888 Allergy status to other drugs, medicaments and biological substances status: Secondary | ICD-10-CM | POA: Diagnosis not present

## 2016-03-14 DIAGNOSIS — I2510 Atherosclerotic heart disease of native coronary artery without angina pectoris: Secondary | ICD-10-CM | POA: Insufficient documentation

## 2016-03-14 DIAGNOSIS — E7800 Pure hypercholesterolemia, unspecified: Secondary | ICD-10-CM | POA: Diagnosis not present

## 2016-03-14 DIAGNOSIS — Z8249 Family history of ischemic heart disease and other diseases of the circulatory system: Secondary | ICD-10-CM | POA: Diagnosis not present

## 2016-03-14 DIAGNOSIS — C9000 Multiple myeloma not having achieved remission: Secondary | ICD-10-CM | POA: Diagnosis not present

## 2016-03-14 DIAGNOSIS — Z79899 Other long term (current) drug therapy: Secondary | ICD-10-CM | POA: Insufficient documentation

## 2016-03-14 DIAGNOSIS — D472 Monoclonal gammopathy: Secondary | ICD-10-CM | POA: Insufficient documentation

## 2016-03-14 DIAGNOSIS — N400 Enlarged prostate without lower urinary tract symptoms: Secondary | ICD-10-CM | POA: Insufficient documentation

## 2016-03-14 DIAGNOSIS — D649 Anemia, unspecified: Secondary | ICD-10-CM | POA: Insufficient documentation

## 2016-03-14 DIAGNOSIS — Z8603 Personal history of neoplasm of uncertain behavior: Secondary | ICD-10-CM | POA: Diagnosis not present

## 2016-03-14 DIAGNOSIS — K7460 Unspecified cirrhosis of liver: Secondary | ICD-10-CM | POA: Diagnosis not present

## 2016-03-14 DIAGNOSIS — K648 Other hemorrhoids: Secondary | ICD-10-CM | POA: Insufficient documentation

## 2016-03-14 DIAGNOSIS — Z9889 Other specified postprocedural states: Secondary | ICD-10-CM | POA: Diagnosis not present

## 2016-03-14 DIAGNOSIS — Z794 Long term (current) use of insulin: Secondary | ICD-10-CM | POA: Insufficient documentation

## 2016-03-14 DIAGNOSIS — I252 Old myocardial infarction: Secondary | ICD-10-CM | POA: Insufficient documentation

## 2016-03-14 DIAGNOSIS — D509 Iron deficiency anemia, unspecified: Secondary | ICD-10-CM | POA: Diagnosis not present

## 2016-03-14 DIAGNOSIS — I10 Essential (primary) hypertension: Secondary | ICD-10-CM | POA: Diagnosis not present

## 2016-03-14 DIAGNOSIS — Z833 Family history of diabetes mellitus: Secondary | ICD-10-CM | POA: Insufficient documentation

## 2016-03-14 DIAGNOSIS — D721 Eosinophilia: Secondary | ICD-10-CM | POA: Diagnosis not present

## 2016-03-14 DIAGNOSIS — R898 Other abnormal findings in specimens from other organs, systems and tissues: Secondary | ICD-10-CM | POA: Diagnosis not present

## 2016-03-14 DIAGNOSIS — R718 Other abnormality of red blood cells: Secondary | ICD-10-CM | POA: Diagnosis not present

## 2016-03-14 LAB — CBC
HEMATOCRIT: 28.2 % — AB (ref 40.0–52.0)
HEMOGLOBIN: 9.8 g/dL — AB (ref 13.0–18.0)
MCH: 28.4 pg (ref 26.0–34.0)
MCHC: 34.7 g/dL (ref 32.0–36.0)
MCV: 81.8 fL (ref 80.0–100.0)
Platelets: 107 10*3/uL — ABNORMAL LOW (ref 150–440)
RBC: 3.44 MIL/uL — ABNORMAL LOW (ref 4.40–5.90)
RDW: 23 % — ABNORMAL HIGH (ref 11.5–14.5)
WBC: 5 10*3/uL (ref 3.8–10.6)

## 2016-03-14 LAB — DIFFERENTIAL
BASOS PCT: 0 %
Band Neutrophils: 0 %
Basophils Absolute: 0 10*3/uL (ref 0–0.1)
Blasts: 0 %
Eosinophils Absolute: 0.7 10*3/uL (ref 0–0.7)
Eosinophils Relative: 13 %
LYMPHS ABS: 1.2 10*3/uL (ref 1.0–3.6)
Lymphocytes Relative: 23 %
METAMYELOCYTES PCT: 0 %
MONO ABS: 0.3 10*3/uL (ref 0.2–1.0)
MYELOCYTES: 0 %
Monocytes Relative: 5 %
NEUTROS ABS: 2.8 10*3/uL (ref 1.4–6.5)
NRBC: 0 /100{WBCs}
Neutrophils Relative %: 59 %
Other: 0 %
PROMYELOCYTES ABS: 0 %

## 2016-03-14 LAB — GLUCOSE, CAPILLARY: Glucose-Capillary: 247 mg/dL — ABNORMAL HIGH (ref 65–99)

## 2016-03-14 MED ORDER — SODIUM CHLORIDE 0.9 % IV SOLN
INTRAVENOUS | Status: DC
  Administered 2016-03-14: 09:00:00 via INTRAVENOUS

## 2016-03-14 MED ORDER — MIDAZOLAM HCL 5 MG/5ML IJ SOLN
INTRAMUSCULAR | Status: AC | PRN
  Administered 2016-03-14 (×2): 0.5 mg via INTRAVENOUS
  Administered 2016-03-14 (×2): 1 mg via INTRAVENOUS

## 2016-03-14 MED ORDER — FENTANYL CITRATE (PF) 100 MCG/2ML IJ SOLN
INTRAMUSCULAR | Status: AC | PRN
  Administered 2016-03-14: 25 ug via INTRAVENOUS
  Administered 2016-03-14: 50 ug via INTRAVENOUS

## 2016-03-14 NOTE — Progress Notes (Signed)
Patient's FSBS at 0700 this AM at [place].

## 2016-03-14 NOTE — Progress Notes (Signed)
FSBS 247

## 2016-03-14 NOTE — Procedures (Signed)
BM aspirate and core R iliac No comp/EBL

## 2016-03-14 NOTE — Consult Note (Signed)
Chief Complaint: Patient was seen in consultation today for No chief complaint on file.  at the request of M[place] C  Referring Physician(s): [place] C  Supervising Physician: Marybelle Killings  Patient Status: [facility] - In-pt  History of Present Illness: Zachary Buck is a 76 y.o. male with anemia and a history of a monoclonal gammopathy. He is scheduled for follow up bone marrow biopsy. He has liver diease and had encephalopathy recently, but today, feels lucid.  Past Medical History:  Diagnosis Date  . Anemia   . Atherosclerotic heart disease    s/p MI, s/p stent mid circumflex  . CAD (coronary artery disease)    Dr Nehemiah Massed, cardiologist  . Cirrhosis of liver ([place])   . Clavicle fracture   . Diabetes mellitus without complication ([place])    oral med  . Elevated blood sugar    02/03/16 and 02/09/16 had BS over 400.  otherwise running around 250  . Esophageal varices ([place])   . Hepatic encephalopathy ([place])   . Hypercholesterolemia   . Hypertension    controlled on meds  . Internal hemorrhoids   . Leg fracture   . Multiple myeloma ([place])   . Myocardial infarct   . Prostate enlargement   . Tubular adenoma of colon     Past Surgical History:  Procedure Laterality Date  . COLONOSCOPY WITH PROPOFOL N/A 03/04/2015   Procedure: COLONOSCOPY WITH PROPOFOL;  Surgeon: Jerene Bears, MD;  Location: [facility];  Service: Gastroenterology;  Laterality: N/A;  . COLONOSCOPY WITH PROPOFOL N/A 12/09/2015   Procedure: COLONOSCOPY WITH PROPOFOL;  Surgeon: [name], MD;  Location: [place];  Service: Endoscopy;  Laterality: N/A;  DIABETIC-ORAL MEDS  . CORONARY ANGIOPLASTY WITH STENT PLACEMENT     2 stents  . ESOPHAGOGASTRODUODENOSCOPY (EGD) WITH PROPOFOL N/A 03/04/2015   Procedure: ESOPHAGOGASTRODUODENOSCOPY (EGD) WITH PROPOFOL;  Surgeon: Jerene Bears, MD;  Location: [facility];  Service: Gastroenterology;  Laterality: N/A;  .  ESOPHAGOGASTRODUODENOSCOPY (EGD) WITH PROPOFOL N/A 12/09/2015   Procedure: ESOPHAGOGASTRODUODENOSCOPY (EGD) WITH PROPOFOL;  Surgeon: [name], MD;  Location: [place];  Service: Endoscopy;  Laterality: N/A;  . ESOPHAGOGASTRODUODENOSCOPY (EGD) WITH PROPOFOL N/A 12/21/2015   Procedure: ESOPHAGOGASTRODUODENOSCOPY (EGD) WITH PROPOFOL;  Surgeon: [name], MD;  Location: [facility];  Service: Endoscopy;  Laterality: N/A;  . ESOPHAGOGASTRODUODENOSCOPY (EGD) WITH PROPOFOL N/A 02/24/2016   Procedure: ESOPHAGOGASTRODUODENOSCOPY (EGD) WITH PROPOFOL;  Surgeon: [name], MD;  Location: [place];  Service: Endoscopy;  Laterality: N/A;  diabetic - oral med  . POLYPECTOMY N/A 12/09/2015   Procedure: POLYPECTOMY;  Surgeon: [name], MD;  Location: [place];  Service: Endoscopy;  Laterality: N/A;  . TONSILLECTOMY      Allergies: Niaspan [niacin er]  Medications: Prior to Admission medications   Medication Sig Start Date End Date Taking? Authorizing Provider  Alpha-Lipoic Acid (LIPOIC ACID PO) Take 200 mg by mouth daily.    Yes Historical Provider, MD  Ascorbic Acid (VITAMIN C) 1000 MG tablet Take 1,000 mg by mouth daily.   Yes Historical Provider, MD  B Complex Vitamins (VITAMIN B COMPLEX) TABS Take by mouth daily.   Yes Historical Provider, MD  blood glucose meter kit and supplies KIT Dispense based on patient and insurance preference. Use up to four times daily as directed. (FOR ICD-9 250.00, 250.01). 03/16/14  Yes Einar Pheasant, MD  Cholecalciferol (VITAMIN D-3) 1000 UNITS CAPS Take by mouth.   Yes Historical Provider, MD  CHROMIUM PO Take 600 mg  by mouth daily.    Yes Historical Provider, MD  Cinnamon 500 MG capsule Take 500 mg by mouth.   Yes Historical Provider, MD  Coenzyme Q10 100 MG capsule Take by mouth.   Yes Historical Provider, MD  furosemide (LASIX) 20 MG tablet Take 1 tablet (20 mg total) by mouth 2 (two) times daily. 01/03/16  Yes [name], MD    Ginger, Zingiber officinalis, (GINGER ROOT) 550 MG CAPS Take by mouth daily.   Yes Historical Provider, MD  glipiZIDE (GLUCOTROL) 5 MG tablet TAKE 1 TABLET BY MOUTH EVERY MORNING AND 2 TABLETS EVERY EVENING 10/18/15  Yes Einar Pheasant, MD  lactulose (CHRONULAC) 10 GM/15ML solution Take 15 mLs (10 g total) by mouth 2 (two) times daily. Titrate to 2 soft stools per day. Decrease if you start having diarrhea 03/13/16  Yes [name], MD  lisinopril (PRINIVIL,ZESTRIL) 20 MG tablet TAKE ONE TABLET BY MOUTH EVERY DAY/ AM 01/11/15  Yes Historical Provider, MD  Magnesium 500 MG CAPS Take 500 mg by mouth daily.   Yes Historical Provider, MD  metFORMIN (GLUCOPHAGE) 1000 MG tablet TAKE 1 TABLET(1000 MG) BY MOUTH TWICE DAILY WITH A MEAL 08/12/15  Yes Einar Pheasant, MD  Omega-3 Fatty Acids (FISH OIL) 1000 MG CAPS Take 4,000 mg by mouth daily.    Yes Historical Provider, MD  OVER THE COUNTER MEDICATION 250 MG CITICOLINE   Yes Historical Provider, MD  OVER THE COUNTER MEDICATION    Yes Historical Provider, MD  spironolactone (ALDACTONE) 50 MG tablet Take 1 tablet (50 mg total) by mouth daily. 01/03/16  Yes [name], MD  blood glucose meter kit and supplies KIT Dispense based on patient and insurance preference. Use up to four times daily as directed. E11.9 Please dispense Freestyle freedom meter and supplies. 09/09/15   Jayce G Cook, DO  glucose blood (BAYER CONTOUR NEXT TEST) test strip Contour Next EZ test strips: check blood sugars twice a day. Dx: 250.00 05/07/13   Historical Provider, MD  glucose blood (ONE TOUCH ULTRA TEST) test strip Use as instructed 11/18/15   Einar Pheasant, MD  insulin lispro (HUMALOG) 100 UNIT/ML injection Use sliding scale as directed.  (i.e., if blood sugar 251-300 - give 2 units, etc as outlined. 02/23/16   Einar Pheasant, MD  Insulin Syringe-Needle U-100 (INSULIN SYRINGE .5CC/31GX5/16") 31G X 5/16" 0.5 ML MISC Will be using a SSI - for elevated blood sugars. 02/23/16   Einar Pheasant,  MD  Lancets (ONETOUCH ULTRASOFT) lancets Use as instructed 03/16/14   Einar Pheasant, MD  metFORMIN (GLUCOPHAGE) 1000 MG tablet TAKE 1 TABLET(1000 MG) BY MOUTH TWICE DAILY WITH A MEAL 02/04/16   Einar Pheasant, MD  NOVOLOG 100 UNIT/ML injection USE SLIDING SCALE AS OUTLINED BY PRESCRIBER 02/24/16   Historical Provider, MD  [place]    Historical Provider, MD  OVER THE COUNTER MEDICATION     Historical Provider, MD  OVER THE COUNTER MEDICATION     Historical Provider, MD  OVER THE COUNTER MEDICATION     Historical Provider, MD  SBI/Protein Isolate (ENTERAGAM) 5 g PACK Take 1 packet by mouth daily. Patient not taking: Reported on 03/14/2016 07/26/15   Lequita Asal, MD  sildenafil (REVATIO) 20 MG tablet 3 to 5 tablets per day or as instructed 09/01/15   Historical Provider, MD  Theanine 100 MG CAPS Take by mouth daily.    Historical Provider, MD  triamcinolone cream (KENALOG) 0.1 % Apply topically 2 (two) times daily. Please avoid  face and genitalia area.  Do not use in the same spot for more than 7-10 days in a row. Patient not taking: Reported on 03/14/2016 02/05/16   Einar Pheasant, MD  Turmeric POWD 4,000 mg by Does not apply route daily.    Historical Provider, MD  vitamin B-12 (CYANOCOBALAMIN) 100 MCG tablet Take 200 mcg by mouth daily.    Historical Provider, MD     Family History  Problem Relation Age of Onset  . Aneurysm Father 63    of the heart  . Heart attack Father   . Diabetes Mother     Social History   Social History  . Marital status: Married    Spouse name: N/A  . Number of children: 2  . Years of education: N/A   Occupational History  . Estate manager/land agent   Social History Main Topics  . Smoking status: Never Smoker  . Smokeless tobacco: Never Used  . Alcohol use No  . Drug use: No  . Sexual activity: Not Asked   Other Topics Concern  . None   Social History Narrative  . None     Review of Systems: A 12 point ROS discussed and  pertinent positives are indicated in the HPI above.  All other systems are negative.  Review of Systems  Vital Signs: BP 131/74   Pulse 76   Temp 98.1 F (36.7 C)   Resp 17   SpO2 96%   Physical Exam  Constitutional: He is oriented to person, place, and time. He appears well-developed and well-nourished.  HENT:  Head: Normocephalic and atraumatic.  Cardiovascular: Normal rate and regular rhythm.   Pulmonary/Chest: Effort normal and breath sounds normal.  Neurological: He is alert and oriented to person, place, and time.    Mallampati Score: 1    Imaging: No results found.  Labs:  CBC:  Recent Labs  02/03/16 1321 02/08/16 1031 02/17/16 1005 03/14/16 0821  WBC 6.7 5.1 4.8 5.0  HGB 9.9* 9.8* 10.4* 9.8*  HCT 29.5* 29.4* 30.0* 28.2*  PLT 156 125* 115* 107*    COAGS:  Recent Labs  08/25/15 1058  INR 1.1*    BMP:  Recent Labs  02/08/16 1031 02/17/16 1005 02/23/16 1133 02/24/16 0821 03/03/16 1454  NA 132* 135 135 135 135  K 4.6 5.1 5.5* 4.6 4.5  CL 102 107 105 106 105  CO2 24 19* 23 21* 22  GLUCOSE 252* 242* 275* 247* 301*  BUN 44* 38* 28* 31* 29*  CALCIUM 10.0 10.1 9.9 10.0 10.4*  CREATININE 1.53* 1.53* 1.25 1.30* 1.43*  GFRNONAA 43* 42*  --  52* 46*  GFRAA 50* 49*  --  60* 53*    LIVER FUNCTION TESTS:  Recent Labs  02/03/16 1321 02/08/16 1031 02/17/16 1005 03/03/16 1454  BILITOT 0.8 1.0 0.7 1.1  AST 34 23 31 31   ALT 26 21 25 26   ALKPHOS 65 57 62 72  PROT 8.3* 8.6* 8.5* 8.7*  ALBUMIN 3.4* 3.4* 3.5 3.7    TUMOR MARKERS: No results for input(s): AFPTM, CEA, CA199, CHROMGRNA in the last 8760 hours.  Assessment and Plan:  Multiple myeloma. Bone marrow biopsy to follow.  Thank you for this interesting consult.  I greatly enjoyed meeting Zachary Buck and look forward to participating in their care.  A copy of this report was sent to the requesting provider on this date.  Electronically Signed: Jataya Wann, ART A 03/14/2016,  8:48 AM   I spent a total of 40  Minutes  in face to face in clinical consultation, greater than 50% of which was counseling/coordinating care for bone marrow biopsy.

## 2016-03-16 ENCOUNTER — Encounter: Payer: Self-pay | Admitting: Hematology and Oncology

## 2016-03-17 ENCOUNTER — Encounter: Payer: Self-pay | Admitting: Internal Medicine

## 2016-03-21 ENCOUNTER — Encounter
Admission: RE | Admit: 2016-03-21 | Discharge: 2016-03-21 | Disposition: A | Discharge status: Home / Self Care | Payer: PPO | Source: Ambulatory Visit | Attending: Gastroenterology | Admitting: Gastroenterology

## 2016-03-21 DIAGNOSIS — K7460 Unspecified cirrhosis of liver: Secondary | ICD-10-CM | POA: Insufficient documentation

## 2016-03-21 DIAGNOSIS — R188 Other ascites: Secondary | ICD-10-CM

## 2016-03-21 LAB — COMPREHENSIVE METABOLIC PANEL
ALK PHOS: 76 U/L (ref 38–126)
ALT: 34 U/L (ref 17–63)
AST: 33 U/L (ref 15–41)
Albumin: 3.5 g/dL (ref 3.5–5.0)
Anion gap: 8 (ref 5–15)
BILIRUBIN TOTAL: 0.9 mg/dL (ref 0.3–1.2)
BUN: 23 mg/dL — AB (ref 6–20)
CALCIUM: 9.8 mg/dL (ref 8.9–10.3)
CO2: 23 mmol/L (ref 22–32)
Chloride: 102 mmol/L (ref 101–111)
Creatinine, Ser: 1.36 mg/dL — ABNORMAL HIGH (ref 0.61–1.24)
GFR calc Af Amer: 57 mL/min — ABNORMAL LOW (ref >60)
GFR, EST NON AFRICAN AMERICAN: 49 mL/min — AB (ref >60)
GLUCOSE: 350 mg/dL — AB (ref 65–99)
Potassium: 4.7 mmol/L (ref 3.5–5.1)
Sodium: 133 mmol/L — ABNORMAL LOW (ref 135–145)
TOTAL PROTEIN: 8.4 g/dL — AB (ref 6.5–8.1)

## 2016-03-22 ENCOUNTER — Telehealth: Payer: Self-pay

## 2016-03-22 NOTE — Telephone Encounter (Signed)
-----   Message from [name], MD sent at 03/21/2016  1:44 PM EST ----- Let the patient know that his lab work is stable as far as his kidney function is concerned.

## 2016-03-22 NOTE — Telephone Encounter (Signed)
Pt's wife notified of lab results. 

## 2016-03-24 ENCOUNTER — Telehealth: Payer: Self-pay | Admitting: *Deleted

## 2016-03-24 NOTE — Telephone Encounter (Signed)
Wife called and asked question on mychart information about bone marrow.  Called patient back and discussed that the doctor would review the bone marrow information with him at his next appt on 03-27-16 voiced understanding.

## 2016-03-27 ENCOUNTER — Inpatient Hospital Stay: Payer: PPO | Attending: Hematology and Oncology

## 2016-03-27 DIAGNOSIS — Z955 Presence of coronary angioplasty implant and graft: Secondary | ICD-10-CM | POA: Insufficient documentation

## 2016-03-27 DIAGNOSIS — I10 Essential (primary) hypertension: Secondary | ICD-10-CM | POA: Diagnosis not present

## 2016-03-27 DIAGNOSIS — Z86010 Personal history of colonic polyps: Secondary | ICD-10-CM | POA: Diagnosis not present

## 2016-03-27 DIAGNOSIS — I2510 Atherosclerotic heart disease of native coronary artery without angina pectoris: Secondary | ICD-10-CM | POA: Insufficient documentation

## 2016-03-27 DIAGNOSIS — C9000 Multiple myeloma not having achieved remission: Secondary | ICD-10-CM | POA: Diagnosis not present

## 2016-03-27 DIAGNOSIS — I252 Old myocardial infarction: Secondary | ICD-10-CM | POA: Diagnosis not present

## 2016-03-27 DIAGNOSIS — E7800 Pure hypercholesterolemia, unspecified: Secondary | ICD-10-CM | POA: Diagnosis not present

## 2016-03-27 DIAGNOSIS — Z77090 Contact with and (suspected) exposure to asbestos: Secondary | ICD-10-CM | POA: Diagnosis not present

## 2016-03-27 DIAGNOSIS — K766 Portal hypertension: Secondary | ICD-10-CM | POA: Diagnosis not present

## 2016-03-27 DIAGNOSIS — K648 Other hemorrhoids: Secondary | ICD-10-CM | POA: Diagnosis not present

## 2016-03-27 DIAGNOSIS — K760 Fatty (change of) liver, not elsewhere classified: Secondary | ICD-10-CM | POA: Insufficient documentation

## 2016-03-27 DIAGNOSIS — D509 Iron deficiency anemia, unspecified: Secondary | ICD-10-CM | POA: Diagnosis not present

## 2016-03-27 DIAGNOSIS — N400 Enlarged prostate without lower urinary tract symptoms: Secondary | ICD-10-CM | POA: Diagnosis not present

## 2016-03-27 DIAGNOSIS — K7469 Other cirrhosis of liver: Secondary | ICD-10-CM | POA: Diagnosis not present

## 2016-03-27 LAB — CBC WITH DIFFERENTIAL/PLATELET
Basophils Absolute: 0 10*3/uL (ref 0–0.1)
Basophils Relative: 1 %
Eosinophils Absolute: 0.8 10*3/uL — ABNORMAL HIGH (ref 0–0.7)
Eosinophils Relative: 16 %
HCT: 29.9 % — ABNORMAL LOW (ref 40.0–52.0)
Hemoglobin: 10.7 g/dL — ABNORMAL LOW (ref 13.0–18.0)
Lymphocytes Relative: 18 %
Lymphs Abs: 1 10*3/uL (ref 1.0–3.6)
MCH: 30.1 pg (ref 26.0–34.0)
MCHC: 35.9 g/dL (ref 32.0–36.0)
MCV: 83.8 fL (ref 80.0–100.0)
Monocytes Absolute: 0.6 10*3/uL (ref 0.2–1.0)
Monocytes Relative: 11 %
Neutro Abs: 2.8 10*3/uL (ref 1.4–6.5)
Neutrophils Relative %: 54 %
Platelets: 119 10*3/uL — ABNORMAL LOW (ref 150–440)
RBC: 3.56 MIL/uL — ABNORMAL LOW (ref 4.40–5.90)
RDW: 23.6 % — ABNORMAL HIGH (ref 11.5–14.5)
WBC: 5.2 10*3/uL (ref 3.8–10.6)

## 2016-03-27 LAB — FERRITIN: Ferritin: 46 ng/mL (ref 24–336)

## 2016-03-28 ENCOUNTER — Inpatient Hospital Stay (HOSPITAL_BASED_OUTPATIENT_CLINIC_OR_DEPARTMENT_OTHER): Payer: PPO | Admitting: Hematology and Oncology

## 2016-03-28 ENCOUNTER — Encounter: Payer: Self-pay | Admitting: Hematology and Oncology

## 2016-03-28 VITALS — BP 147/75 | HR 76 | Temp 96.2°F | Resp 18 | Wt 174.2 lb

## 2016-03-28 DIAGNOSIS — C9000 Multiple myeloma not having achieved remission: Secondary | ICD-10-CM

## 2016-03-28 DIAGNOSIS — R5383 Other fatigue: Secondary | ICD-10-CM

## 2016-03-28 DIAGNOSIS — D509 Iron deficiency anemia, unspecified: Secondary | ICD-10-CM | POA: Diagnosis not present

## 2016-03-28 DIAGNOSIS — D649 Anemia, unspecified: Secondary | ICD-10-CM

## 2016-03-28 LAB — PROTEIN ELECTROPHORESIS, SERUM
A/G Ratio: 0.7 (ref 0.7–1.7)
Albumin ELP: 3.1 g/dL (ref 2.9–4.4)
Alpha-1-Globulin: 0.2 g/dL (ref 0.0–0.4)
Alpha-2-Globulin: 0.6 g/dL (ref 0.4–1.0)
Beta Globulin: 0.8 g/dL (ref 0.7–1.3)
Gamma Globulin: 2.6 g/dL — ABNORMAL HIGH (ref 0.4–1.8)
Globulin, Total: 4.3 g/dL — ABNORMAL HIGH (ref 2.2–3.9)
M-Spike, %: 2.4 g/dL — ABNORMAL HIGH
Total Protein ELP: 7.4 g/dL (ref 6.0–8.5)

## 2016-03-28 LAB — KAPPA/LAMBDA LIGHT CHAINS
Kappa free light chain: 22.2 mg/L — ABNORMAL HIGH (ref 3.3–19.4)
Kappa, lambda light chain ratio: 0.13 — ABNORMAL LOW (ref 0.26–1.65)
Lambda free light chains: 167 mg/L — ABNORMAL HIGH (ref 5.7–26.3)

## 2016-03-28 NOTE — Progress Notes (Signed)
[facility] day:  03/28/16  Chief Complaint: Zachary Buck is a 76 y.o. male  with stage II multiple myeloma and iron deficiency anemia who is seen for  review of interval bone marrow and 1 month assessment.  HPI: The patient was last seen in the medical oncology clinic on 02/17/2016.  At that time, he felt  fatigued.  He noted intermittent epigastric discomfort and discomfort across his upper hips.  SPEP had increased to 2.8 gm/dL.  Ferritin was 23.  He was contacted regarding additional IV iron and repeat bone marrow.  He received Venofer weekly x 3 (02/18/2016 - 03/03/2016).    He underwent EGD on 02/24/2016.  There were no gross lesions in the esophagus. There was a single gastric polyp and a duodenal polyp. Resection was attempted. No specimens were collected  Bone marrow on 03/14/2016 revealed persistent monoclonal plasma cell infiltrate of 10-15%.  Marrow was hypercellular for age (40-50%) with trilineage hematopoiesis, increase eosinophils, adequate megakaryocytes and no increase in blasts. There was mild patchy increase in reticulin fibers. There was stainable iron consistent with recent parenteral iron.  Cytogenetics were normal (46, XY). FISH studies revealed CCND1/IGH translocation t(11;14).  Symptomatically, he continues to feel fatigued, but overall pretty good. He denies any hip pain or abdominal discomfort. He denies any diarrhea. He denies any issues with infections.    Past Medical History:  Diagnosis Date  . Anemia   . Atherosclerotic heart disease    s/p MI, s/p stent mid circumflex  . CAD (coronary artery disease)    Dr Nehemiah Massed, cardiologist  . Cirrhosis of liver ([place])   . Clavicle fracture   . Diabetes mellitus without complication ([place])    oral med  . Elevated blood sugar    02/03/16 and 02/09/16 had BS over 400.  otherwise running around 250  . Esophageal varices ([place])   . Hepatic encephalopathy ([place])   .  Hypercholesterolemia   . Hypertension    controlled on meds  . Internal hemorrhoids   . Leg fracture   . Multiple myeloma ([place])   . Myocardial infarct   . Prostate enlargement   . Tubular adenoma of colon     Past Surgical History:  Procedure Laterality Date  . COLONOSCOPY WITH PROPOFOL N/A 03/04/2015   Procedure: COLONOSCOPY WITH PROPOFOL;  Surgeon: Jerene Bears, MD;  Location: [facility];  Service: Gastroenterology;  Laterality: N/A;  . COLONOSCOPY WITH PROPOFOL N/A 12/09/2015   Procedure: COLONOSCOPY WITH PROPOFOL;  Surgeon: [name], MD;  Location: [place];  Service: Endoscopy;  Laterality: N/A;  DIABETIC-ORAL MEDS  . CORONARY ANGIOPLASTY WITH STENT PLACEMENT     2 stents  . ESOPHAGOGASTRODUODENOSCOPY (EGD) WITH PROPOFOL N/A 03/04/2015   Procedure: ESOPHAGOGASTRODUODENOSCOPY (EGD) WITH PROPOFOL;  Surgeon: Jerene Bears, MD;  Location: [facility];  Service: Gastroenterology;  Laterality: N/A;  . ESOPHAGOGASTRODUODENOSCOPY (EGD) WITH PROPOFOL N/A 12/09/2015   Procedure: ESOPHAGOGASTRODUODENOSCOPY (EGD) WITH PROPOFOL;  Surgeon: [name], MD;  Location: [place];  Service: Endoscopy;  Laterality: N/A;  . ESOPHAGOGASTRODUODENOSCOPY (EGD) WITH PROPOFOL N/A 12/21/2015   Procedure: ESOPHAGOGASTRODUODENOSCOPY (EGD) WITH PROPOFOL;  Surgeon: [name], MD;  Location: [facility];  Service: Endoscopy;  Laterality: N/A;  . ESOPHAGOGASTRODUODENOSCOPY (EGD) WITH PROPOFOL N/A 02/24/2016   Procedure: ESOPHAGOGASTRODUODENOSCOPY (EGD) WITH PROPOFOL;  Surgeon: [name], MD;  Location: [place];  Service: Endoscopy;  Laterality: N/A;  diabetic - oral med  . POLYPECTOMY N/A 12/09/2015   Procedure:  POLYPECTOMY;  Surgeon: [name], MD;  Location: [place];  Service: Endoscopy;  Laterality: N/A;  . TONSILLECTOMY      Family History  Problem Relation Age of Onset  . Aneurysm Father 13    of the heart  . Heart attack Father   . Diabetes  Mother     Social History:  reports that he has never smoked. He has never used smokeless tobacco. He reports that he does not drink alcohol or use drugs.  Patient denies any exposure to radiation or toxins.  He is a Company secretary.  He lives in [place].  The patient is accompanied by his wife, [name], today.  Allergies:  Allergies  Allergen Reactions  . Niaspan [Niacin Er] Other (See Comments)    Just does not want to take     Current Medications: Current Outpatient Prescriptions  Medication Sig Dispense Refill  . Alpha-Lipoic Acid (LIPOIC ACID PO) Take 200 mg by mouth daily.     . Ascorbic Acid (VITAMIN C) 1000 MG tablet Take 1,000 mg by mouth daily.    . B Complex Vitamins (VITAMIN B COMPLEX) TABS Take by mouth daily.    . blood glucose meter kit and supplies KIT Dispense based on patient and insurance preference. Use up to four times daily as directed. (FOR ICD-9 250.00, 250.01). 1 each 0  . blood glucose meter kit and supplies KIT Dispense based on patient and insurance preference. Use up to four times daily as directed. E11.9 Please dispense Freestyle freedom meter and supplies. 1 each 0  . Cholecalciferol (VITAMIN D-3) 1000 UNITS CAPS Take by mouth.    . CHROMIUM PO Take 600 mg by mouth daily.     . Cinnamon 500 MG capsule Take 500 mg by mouth.    . Coenzyme Q10 100 MG capsule Take by mouth.    . furosemide (LASIX) 20 MG tablet Take 1 tablet (20 mg total) by mouth 2 (two) times daily. 60 tablet 3  . Ginger, Zingiber officinalis, (GINGER ROOT) 550 MG CAPS Take by mouth daily.    Marland Kitchen glipiZIDE (GLUCOTROL) 5 MG tablet TAKE 1 TABLET BY MOUTH EVERY MORNING AND 2 TABLETS EVERY EVENING 270 tablet 2  . glucose blood (BAYER CONTOUR NEXT TEST) test strip Contour Next EZ test strips: check blood sugars twice a day. Dx: 250.00    . glucose blood (ONE TOUCH ULTRA TEST) test strip Use as instructed 100 each 12  . insulin lispro (HUMALOG) 100 UNIT/ML injection Use sliding scale as directed.  (i.e.,  if blood sugar 251-300 - give 2 units, etc as outlined. 10 mL 1  . Insulin Syringe-Needle U-100 (INSULIN SYRINGE .5CC/31GX5/16") 31G X 5/16" 0.5 ML MISC Will be using a SSI - for elevated blood sugars. 100 each 0  . lactulose (CHRONULAC) 10 GM/15ML solution Take 15 mLs (10 g total) by mouth 2 (two) times daily. Titrate to 2 soft stools per day. Decrease if you start having diarrhea 946 mL 3  . Lancets (ONETOUCH ULTRASOFT) lancets Use as instructed 100 each 12  . lisinopril (PRINIVIL,ZESTRIL) 20 MG tablet TAKE ONE TABLET BY MOUTH EVERY DAY/ AM    . Magnesium 500 MG CAPS Take 500 mg by mouth daily.    . metFORMIN (GLUCOPHAGE) 1000 MG tablet TAKE 1 TABLET(1000 MG) BY MOUTH TWICE DAILY WITH A MEAL 60 tablet 5  . metFORMIN (GLUCOPHAGE) 1000 MG tablet TAKE 1 TABLET(1000 MG) BY MOUTH TWICE DAILY WITH A MEAL 60 tablet 2  . NOVOLOG 100 UNIT/ML  injection USE SLIDING SCALE AS OUTLINED BY PRESCRIBER  1  . Omega-3 Fatty Acids (FISH OIL) 1000 MG CAPS Take 4,000 mg by mouth daily.     Marland Kitchen OVER THE COUNTER MEDICATION BUPLEURUM    . OVER THE COUNTER MEDICATION 250 MG CITICOLINE    . OVER THE COUNTER MEDICATION     . OVER THE COUNTER MEDICATION     . OVER THE COUNTER MEDICATION     . OVER THE COUNTER MEDICATION     . SBI/Protein Isolate (ENTERAGAM) 5 g PACK Take 1 packet by mouth daily. 30 each 3  . sildenafil (REVATIO) 20 MG tablet 3 to 5 tablets per day or as instructed    . spironolactone (ALDACTONE) 50 MG tablet Take 1 tablet (50 mg total) by mouth daily. 30 tablet 3  . Theanine 100 MG CAPS Take by mouth daily.    Marland Kitchen triamcinolone cream (KENALOG) 0.1 % Apply topically 2 (two) times daily. Please avoid face and genitalia area.  Do not use in the same spot for more than 7-10 days in a row. 80 g 0  . Turmeric POWD 4,000 mg by Does not apply route daily.    . vitamin B-12 (CYANOCOBALAMIN) 100 MCG tablet Take 200 mcg by mouth daily.     No current facility-administered medications for this visit.     Facility-Administered Medications Ordered in Other Visits  Medication Dose Route Frequency Provider Last Rate Last Dose  . alteplase (CATHFLO ACTIVASE) injection 2 mg  2 mg Intracatheter Once PRN Lequita Asal, MD      . heparin lock flush 100 unit/mL  500 Units Intracatheter Once PRN Lequita Asal, MD      . heparin lock flush 100 unit/mL  250 Units Intracatheter Once PRN Lequita Asal, MD      . sodium chloride 0.9 % injection 10 mL  10 mL Intracatheter PRN Lequita Asal, MD      . sodium chloride 0.9 % injection 3 mL  3 mL Intravenous Once PRN Lequita Asal, MD        Review of Systems:  GENERAL:  Feels fatigued, but overall good.  No fevers or sweats.  Weight down 2 pounds. PERFORMANCE STATUS (ECOG):  1 HEENT:  No visual changes, runny nose, sore throat, mouth sores or tenderness. Lungs: No shortness of breath or cough.  No hemoptysis. Cardiac:  No chest pain, palpitations, orthopnea, or PND. GI:  Eating well.  No abdominal pain.  No nausea, vomiting,constipation, melena or hematochezia.  GU:  No urgency, frequency, dysuria, or hematuria. Musculoskeletal:  No back pain.  No joint pain.  No muscle tenderness. Extremities:  No pain or swelling. Skin:  No rashes or skin changes. Neuro:  Short term memory issues.  No headache, numbness or weakness, balance or coordination issues. Endocrine:  Diabetes.  No thyroid issues, hot flashes or night sweats. Psych:  No mood changes, depression or anxiety. Pain:  Denies pain. Review of systems:  All other systems reviewed and found to be negative.  Physical Exam: BP (!) 147/75 (BP Location: Left Arm, Patient Position: Sitting) Comment: Recheck  Pulse 76   Temp (!) 96.2 F (35.7 C) (Tympanic)   Resp 18   Wt 174 lb 2.6 oz (79 kg)   BMI 25.72 kg/m   GENERAL:  Well developed, well nourished, gentleman sitting comfortably in the exam room in no acute distress. MENTAL STATUS:  Alert and oriented to person, place and  time. HEAD:  Pearline Cables  hair.  Normocephalic, atraumatic, face symmetric, no Cushingoid features. EYES:  Glasses.  Blue eyes.  Pupils equal round and reactive to light and accomodation.  No conjunctivitis or scleral icterus. ENT:  Oropharynx clear without lesion.  Tongue normal. Mucous membranes moist.  RESPIRATORY:  Clear to auscultation without rales, wheezes or rhonchi. CARDIOVASCULAR:  Regular rate and rhythm without murmur, rub or gallop. ABDOMEN:  Soft, non tender with active bowel sounds, and no appreciable hepatosplenomegaly.  No masses. SKIN:  No rashes, ulcers or lesions. EXTREMITIES: No edema, no skin discoloration or tenderness.  No palpable cords. LYMPH NODES: No palpable cervical, supraclavicular, axillary or inguinal adenopathy  NEUROLOGICAL: Unremarkable. PSYCH:  Appropriate.    Appointment on 03/27/2016  Component Date Value Ref Range Status  . WBC 03/27/2016 5.2  3.8 - 10.6 K/uL Final  . RBC 03/27/2016 3.56* 4.40 - 5.90 MIL/uL Final  . Hemoglobin 03/27/2016 10.7* 13.0 - 18.0 g/dL Final  . HCT 03/27/2016 29.9* 40.0 - 52.0 % Final  . MCV 03/27/2016 83.8  80.0 - 100.0 fL Final  . MCH 03/27/2016 30.1  26.0 - 34.0 pg Final  . MCHC 03/27/2016 35.9  32.0 - 36.0 g/dL Final  . RDW 03/27/2016 23.6* 11.5 - 14.5 % Final  . Platelets 03/27/2016 119* 150 - 440 K/uL Final  . Neutrophils Relative % 03/27/2016 54  % Final  . Neutro Abs 03/27/2016 2.8  1.4 - 6.5 K/uL Final  . Lymphocytes Relative 03/27/2016 18  % Final  . Lymphs Abs 03/27/2016 1.0  1.0 - 3.6 K/uL Final  . Monocytes Relative 03/27/2016 11  % Final  . Monocytes Absolute 03/27/2016 0.6  0.2 - 1.0 K/uL Final  . Eosinophils Relative 03/27/2016 16  % Final  . Eosinophils Absolute 03/27/2016 0.8* 0 - 0.7 K/uL Final  . Basophils Relative 03/27/2016 1  % Final  . Basophils Absolute 03/27/2016 0.0  0 - 0.1 K/uL Final  . Ferritin 03/27/2016 46  24 - 336 ng/mL Final    Assessment:  NAJEH CREDIT is a 76 y.o. male with  iron deficiency anemia and stage II multiple myeloma. 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 Serum immunoglobulins noted an IgG of 2930 (high). 24-hour urine revealed a 8.8% monoclonal protein (10.6 mg/24 hours).  Albumen was 3.3 on 08/26/2014. He had pneumonia in 05/2014.   Bone survey on 10/20/2014 revealed no lytic lesions. Bone survey on 09/16/2015 revealed no focal lytic lesion or acute bony abnormality.   SPEP was stable: 2.3 g/dL on 10/09/2014, 01/18/2015, 03/22/2015, 06/18/2015, 09/13/2015, and 11/08/2015.  SPEP was 2.7 gm/dL on 01/11/2016 and 2.8 gm/dL on 02/17/2016. Lambda free light chainswere155.3 (ratio 0.15) on 01/18/2015, 174.59 (ratio of 0.16)on 06/18/2015, 182.9 (ratio of 0.13)on 09/13/2015, 205 (ratio 0.11) on 11/08/2015, and 292.7 on 01/11/2016.    Bone marrow on 11/10/2014 revealed a 10% monoclonal plasma cell infiltrate. Marrow was hypercellular for age (40-50%) with mixed maturing hematopoiesis, relative erythroid hyperplasia and mild nonspecific dyserythropoiesis. There was patchy mild focally moderate increase in reticulin. There were no significant iron stores. Myeloma FISH panel revealed t(11;14) which results in fusion of CCND1 (BCL1) at 11q13 with the immunoglobulin heavy chain gene (IgH) at 14q32 (a favorable prognostic feature).   Bone marrow on 03/14/2016 revealed persistent monoclonal plasma cell infiltrate of 10-15%.  Marrow was hypercellular for age (40-50%) with trilineage hematopoiesis, increase eosinophils, adequate megakaryocytes and no increase in blasts. There was mild patchy increase in  reticulin fibers. There was stainable iron consistent with recent parenteral iron.  Cytogenetics were normal (46, XY). FISH studies revealed CCND1/IGH translocation t(11;14).  Beta2 microglobulin was 4.2 on 11/24/2014, 3.8 on 06/18/2015, and 4.9 on 10/18/2015.  PET scan on  12/07/2014 revealed no hypermetabolic foci within the marrow space or nodal stations to suggest active multiple myeloma. There was hypermetabolism within the right lower lobe, corresponding to a similar dependent density. Morphology favored scarring.There was cirrhosis and portal hypertension. There was gastric body hypermetabolism favoring physiologic. Spleen was 15.3 cm (volume 1065 cc).  There was pulmonary artery enlargement suggesting pulmonary artery hypertension. 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  He has a history of asbestos exposure in the [employer].  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  Etiology is felt secondary to fatty liver.  Abdominal CT scan on 03/16/2015 revealed splenomegaly (14.5 cm), cirrhosis, and stigmata of portal hypertension with esophageal and gastric varices.  There were no liver lesions.  He is on aldactone.  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  There were no pleural effusions.  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  There was no frank honeycombing. This finding accounted for the medial right lower lobe hypermetabolism on the 12/07/2014 PET-CT.  There was cirrhosis, splenomegaly, and prominent gastroesophageal varices.  He has had mild anemia since 10/2013 and mild thrombocytopenia since 05/2014.   Colonoscopy in 10/2013 revealed polyps.  Colonoscopy on 03/04/2015 revealed a sessile polyp was seen in the transverse colon (tubular adenoma).  Colonoscopy on 12/09/2015 revealed one 4 mm polyp in the ascending colon and two 4-5 mm polyps in the sigmoid colon (hyperplastic polyps).    EGD in 10/2013 revealed gastritis. EGD on 03/04/2015 revealed medium-sized esophageal varices  in the mid and distal esophagus without bleeding. There was a sessile polyp in the gastric antrum (hyperplastic polyp).  There were 3 small angiodysplastic lesions in the second part of the duodenum.  EGD on 12/09/2015 revealed grade II esophageal varices.  There was a single gastric polyp.  There was a single bleeding angioectasia in the duodenum treated with bipolar cautery and clips.  EGD on 02/24/2016 revealed no gross lesions in the esophagus. There was a single gastric polyp and a duodenal polyp. Resection was attempted. No specimens were collected  Capsule enteroscopy on 11/27/2014 revealed 1-2 gastric polyps and one colon polyp.  There were no findings to explain iron deficiency anemia.  His diet was good. Labs confirmed iron deficiency (ferritin 11.8 on 05/2014 and 13 on 10/09/2014). B12 was low normal (MMA normal) thus ruling out B12 deficiency.  He has chronic diarrhea (2 years).  Diarrhea improved on Enterogam (began 06/22/2015).  He has intermittent diarrhea.    He has recurrent iron deficiency anemia.  He has received Venofer weekly 3 (03/26/2015 - 04/14/2015), weekly x 3 (06/25/2015 - 07/09/2015), weekly x 3 (09/17/2015 - 10/01/2015), weekly x 3 (11/16/2015 - 11/30/2015), weekly x 2 (01/18/2016 - 01/25/2016),  weekly x 3 (02/18/2016 - 03/03/2016).    Ferritin has been followed:  23 on 06/18/2015, 121 on 07/26/2015, 17 on 09/13/2015, 18 on 11/15/2015, 52 on 12/13/2015, 10 on 01/11/2016, 88 on 02/03/2016, 23 on 02/17/2016, and 46 on 03/27/2016.  Symptomatically, he is fatigued.  He denies any pain.  Exam is stable.  Plan: 1.  Review labs from 03/27/2016. 2.  Review bone marrow.  Discuss indications for treatment (CRAB criteria).  Discuss borderline hematocrit.  Consider repeat PET scan.  Patient's wife wishes continuation of observation without treatment unless absolutely necessary. 3.  Review interval EGD.  No areas of bleeding. 4.  RTC in 1 month for MD assessment, labs (CBC with  diff, CMP, SPEP, LDH, B12, beta2-microglobulin, ferritin- day before appt).  The patient was seen and examined.  Interval studies were reviewed in detail.  Marrow shows a slight increase in plasma cells.  I discussed indications for treatment of multiple myeloma.  He has struggled with anemia felt secondary to ongoing iron deficiency.  Calcium and creatinine are normal.  No bone disease on last bone survey.  Consider repeat PET scan.  The assessment and plan was discussed with the patient.   A joint decision was made for continued close observation.  Multiple questions were asked and answered.   Faythe Casa, NP  Lequita Asal, MD  03/28/2016, 12:15 PM

## 2016-03-28 NOTE — Progress Notes (Signed)
Patient has not had BP medication today.

## 2016-04-05 ENCOUNTER — Other Ambulatory Visit
Admission: RE | Admit: 2016-04-05 | Discharge: 2016-04-05 | Disposition: A | Discharge status: Home / Self Care | Payer: PPO | Source: Ambulatory Visit | Attending: Gastroenterology | Admitting: Gastroenterology

## 2016-04-05 DIAGNOSIS — C9000 Multiple myeloma not having achieved remission: Secondary | ICD-10-CM | POA: Diagnosis not present

## 2016-04-05 LAB — COMPREHENSIVE METABOLIC PANEL
ALBUMIN: 3.3 g/dL — AB (ref 3.5–5.0)
ALK PHOS: 64 U/L (ref 38–126)
ALT: 28 U/L (ref 17–63)
AST: 38 U/L (ref 15–41)
Anion gap: 8 (ref 5–15)
BUN: 24 mg/dL — AB (ref 6–20)
CO2: 20 mmol/L — AB (ref 22–32)
CREATININE: 1.33 mg/dL — AB (ref 0.61–1.24)
Calcium: 9.5 mg/dL (ref 8.9–10.3)
Chloride: 104 mmol/L (ref 101–111)
GFR calc Af Amer: 58 mL/min — ABNORMAL LOW (ref >60)
GFR calc non Af Amer: 50 mL/min — ABNORMAL LOW (ref >60)
GLUCOSE: 352 mg/dL — AB (ref 65–99)
Potassium: 4.7 mmol/L (ref 3.5–5.1)
SODIUM: 132 mmol/L — AB (ref 135–145)
Total Bilirubin: 1 mg/dL (ref 0.3–1.2)
Total Protein: 7.7 g/dL (ref 6.5–8.1)

## 2016-04-06 ENCOUNTER — Encounter: Payer: Self-pay | Admitting: Internal Medicine

## 2016-04-08 MED ORDER — TRIAMCINOLONE ACETONIDE 0.1 % EX CREA: TOPICAL_CREAM | Freq: Two times a day (BID) | CUTANEOUS | 0 refills | Status: DC

## 2016-04-08 NOTE — Telephone Encounter (Signed)
rx sent in for triamcinolone.

## 2016-04-13 ENCOUNTER — Telehealth: Payer: Self-pay

## 2016-04-13 NOTE — Telephone Encounter (Signed)
-----   Message from [name], MD sent at 04/07/2016  1:50 PM EST ----- Let the patient know the blood tests are stable.

## 2016-04-13 NOTE — Telephone Encounter (Signed)
Pt notified of lab results

## 2016-04-24 ENCOUNTER — Telehealth: Payer: Self-pay | Admitting: *Deleted

## 2016-04-24 ENCOUNTER — Other Ambulatory Visit: Payer: Self-pay | Admitting: *Deleted

## 2016-04-24 ENCOUNTER — Inpatient Hospital Stay: Payer: PPO | Attending: Hematology and Oncology

## 2016-04-24 DIAGNOSIS — K7469 Other cirrhosis of liver: Secondary | ICD-10-CM | POA: Diagnosis not present

## 2016-04-24 DIAGNOSIS — I2510 Atherosclerotic heart disease of native coronary artery without angina pectoris: Secondary | ICD-10-CM | POA: Insufficient documentation

## 2016-04-24 DIAGNOSIS — Z77090 Contact with and (suspected) exposure to asbestos: Secondary | ICD-10-CM | POA: Insufficient documentation

## 2016-04-24 DIAGNOSIS — N400 Enlarged prostate without lower urinary tract symptoms: Secondary | ICD-10-CM | POA: Insufficient documentation

## 2016-04-24 DIAGNOSIS — Z86010 Personal history of colonic polyps: Secondary | ICD-10-CM | POA: Diagnosis not present

## 2016-04-24 DIAGNOSIS — Z79899 Other long term (current) drug therapy: Secondary | ICD-10-CM | POA: Diagnosis not present

## 2016-04-24 DIAGNOSIS — I10 Essential (primary) hypertension: Secondary | ICD-10-CM | POA: Diagnosis not present

## 2016-04-24 DIAGNOSIS — Z955 Presence of coronary angioplasty implant and graft: Secondary | ICD-10-CM | POA: Insufficient documentation

## 2016-04-24 DIAGNOSIS — K648 Other hemorrhoids: Secondary | ICD-10-CM | POA: Diagnosis not present

## 2016-04-24 DIAGNOSIS — D509 Iron deficiency anemia, unspecified: Secondary | ICD-10-CM | POA: Diagnosis not present

## 2016-04-24 DIAGNOSIS — D472 Monoclonal gammopathy: Secondary | ICD-10-CM | POA: Insufficient documentation

## 2016-04-24 DIAGNOSIS — K766 Portal hypertension: Secondary | ICD-10-CM | POA: Insufficient documentation

## 2016-04-24 DIAGNOSIS — I252 Old myocardial infarction: Secondary | ICD-10-CM | POA: Diagnosis not present

## 2016-04-24 DIAGNOSIS — E7800 Pure hypercholesterolemia, unspecified: Secondary | ICD-10-CM | POA: Insufficient documentation

## 2016-04-24 DIAGNOSIS — C9000 Multiple myeloma not having achieved remission: Secondary | ICD-10-CM

## 2016-04-24 DIAGNOSIS — K760 Fatty (change of) liver, not elsewhere classified: Secondary | ICD-10-CM | POA: Insufficient documentation

## 2016-04-24 LAB — VITAMIN B12: Vitamin B-12: 364 pg/mL (ref 180–914)

## 2016-04-24 LAB — LACTATE DEHYDROGENASE: LDH: 180 U/L (ref 98–192)

## 2016-04-24 LAB — COMPREHENSIVE METABOLIC PANEL
ALT: 29 U/L (ref 17–63)
AST: 32 U/L (ref 15–41)
Albumin: 3.3 g/dL — ABNORMAL LOW (ref 3.5–5.0)
Alkaline Phosphatase: 75 U/L (ref 38–126)
BUN: 25 mg/dL — ABNORMAL HIGH (ref 6–20)
CO2: 20 mmol/L — ABNORMAL LOW (ref 22–32)
Calcium: 9.6 mg/dL (ref 8.9–10.3)
Chloride: 106 mmol/L (ref 101–111)
Creatinine, Ser: 1 mg/dL (ref 0.61–1.24)
GFR calc Af Amer: >60 mL/min (ref >60)
GFR calc non Af Amer: >60 mL/min (ref >60)
Glucose, Bld: 291 mg/dL — ABNORMAL HIGH (ref 65–99)
Potassium: 4.8 mmol/L (ref 3.5–5.1)
Sodium: UNDETERMINED mmol/L (ref 135–145)
Total Bilirubin: 1.3 mg/dL — ABNORMAL HIGH (ref 0.3–1.2)
Total Protein: 8.1 g/dL (ref 6.5–8.1)

## 2016-04-24 LAB — CBC WITH DIFFERENTIAL/PLATELET
Basophils Absolute: 0 10*3/uL (ref 0–0.1)
Basophils Relative: 1 %
Eosinophils Absolute: 0.5 10*3/uL (ref 0–0.7)
Eosinophils Relative: 8 %
HCT: 32.7 % — ABNORMAL LOW (ref 40.0–52.0)
Hemoglobin: 12 g/dL — ABNORMAL LOW (ref 13.0–18.0)
Lymphocytes Relative: 18 %
Lymphs Abs: 1 10*3/uL (ref 1.0–3.6)
MCH: 30.8 pg (ref 26.0–34.0)
MCHC: 36.7 g/dL — ABNORMAL HIGH (ref 32.0–36.0)
MCV: 84.1 fL (ref 80.0–100.0)
Monocytes Absolute: 0.5 10*3/uL (ref 0.2–1.0)
Monocytes Relative: 9 %
Neutro Abs: 3.6 10*3/uL (ref 1.4–6.5)
Neutrophils Relative %: 64 %
Platelets: 109 10*3/uL — ABNORMAL LOW (ref 150–440)
RBC: 3.89 MIL/uL — ABNORMAL LOW (ref 4.40–5.90)
RDW: 18 % — ABNORMAL HIGH (ref 11.5–14.5)
WBC: 5.5 10*3/uL (ref 3.8–10.6)

## 2016-04-24 LAB — FERRITIN: Ferritin: 20 ng/mL — ABNORMAL LOW (ref 24–336)

## 2016-04-24 NOTE — Telephone Encounter (Signed)
Called and spoke to patient's wife.  MD wants a fasting lipid panel drawn.  Patient is coming in tomorrow morning.  Asked her to have him fast and have lab drawn before his appointment in the morning.  She verbalized understanding.  Aware patient needs to arrive 30 minutes prior to MD appointment for lab.

## 2016-04-25 ENCOUNTER — Inpatient Hospital Stay: Payer: PPO | Admitting: Oncology

## 2016-04-25 ENCOUNTER — Inpatient Hospital Stay: Payer: PPO

## 2016-04-25 DIAGNOSIS — C9000 Multiple myeloma not having achieved remission: Secondary | ICD-10-CM | POA: Diagnosis not present

## 2016-04-25 DIAGNOSIS — R5383 Other fatigue: Secondary | ICD-10-CM | POA: Insufficient documentation

## 2016-04-25 LAB — LIPID PANEL
Cholesterol: 149 mg/dL (ref 0–200)
HDL: 23 mg/dL — ABNORMAL LOW (ref >40)
LDL Cholesterol: 71 mg/dL (ref 0–99)
Total CHOL/HDL Ratio: 6.5 RATIO
Triglycerides: 273 mg/dL — ABNORMAL HIGH (ref <150)
VLDL: 55 mg/dL — ABNORMAL HIGH (ref 0–40)

## 2016-04-25 LAB — BETA 2 MICROGLOBULIN, SERUM: Beta-2 Microglobulin: 4.6 mg/L — ABNORMAL HIGH (ref 0.6–2.4)

## 2016-04-25 LAB — PROTEIN ELECTROPHORESIS, SERUM
A/G Ratio: 0.7 (ref 0.7–1.7)
Albumin ELP: 3.2 g/dL (ref 2.9–4.4)
Alpha-1-Globulin: 0.2 g/dL (ref 0.0–0.4)
Alpha-2-Globulin: 0.6 g/dL (ref 0.4–1.0)
Beta Globulin: 0.9 g/dL (ref 0.7–1.3)
Gamma Globulin: 2.8 g/dL — ABNORMAL HIGH (ref 0.4–1.8)
Globulin, Total: 4.6 g/dL — ABNORMAL HIGH (ref 2.2–3.9)
M-Spike, %: 2.6 g/dL — ABNORMAL HIGH
Total Protein ELP: 7.8 g/dL (ref 6.0–8.5)

## 2016-04-27 ENCOUNTER — Telehealth: Payer: Self-pay | Admitting: Internal Medicine

## 2016-04-27 NOTE — Telephone Encounter (Signed)
Left pt message asking to call Allison back directly at [phone] to schedule AWV. Thanks! °

## 2016-05-02 ENCOUNTER — Inpatient Hospital Stay (HOSPITAL_BASED_OUTPATIENT_CLINIC_OR_DEPARTMENT_OTHER): Payer: PPO | Admitting: Hematology and Oncology

## 2016-05-02 ENCOUNTER — Other Ambulatory Visit: Payer: Self-pay | Admitting: Hematology and Oncology

## 2016-05-02 ENCOUNTER — Encounter: Payer: Self-pay | Admitting: Hematology and Oncology

## 2016-05-02 ENCOUNTER — Other Ambulatory Visit: Payer: Self-pay | Admitting: Internal Medicine

## 2016-05-02 VITALS — BP 144/68 | HR 82 | Temp 97.6°F | Resp 18 | Wt 175.3 lb

## 2016-05-02 DIAGNOSIS — C9000 Multiple myeloma not having achieved remission: Secondary | ICD-10-CM

## 2016-05-02 DIAGNOSIS — D472 Monoclonal gammopathy: Secondary | ICD-10-CM

## 2016-05-02 DIAGNOSIS — D500 Iron deficiency anemia secondary to blood loss (chronic): Secondary | ICD-10-CM

## 2016-05-02 DIAGNOSIS — Z79899 Other long term (current) drug therapy: Secondary | ICD-10-CM | POA: Diagnosis not present

## 2016-05-02 DIAGNOSIS — D509 Iron deficiency anemia, unspecified: Secondary | ICD-10-CM

## 2016-05-02 NOTE — Progress Notes (Signed)
[facility] day:  05/02/16  Chief Complaint: Zachary Buck is a 76 y.o. male  with stage II multiple myeloma and iron deficiency anemia who is seen for 1 month assessment.  HPI: The patient was last seen in the medical oncology clinic on 03/28/2016.  At that time, he continued to feel fatigued.  He denied any pain.  Bone marrow revealed slight increase in plasma cell infiltrate (10-15%).  We discussed consideration of a PET scan.  Labs included a hematocrit of 29.9, hemoglobin 10.7, platelets 119,000, WBC 5200 with an ANC of 2800.  SPEP revealed a 2.4 gm/dL monoclonal spike.  Kappa free light chain ratio was 0.13.  Ferritin was 46.  Labs on 04/24/2016 revealed a hematocrit of 32.7, hemoglobin 12.0, MCV 84.1, platelets 109,000, WBC 5500 with an [place] of 3600.  CMP was affected by a lipemic specimen.  Ferritin was 20.  LDH was 180.  SPEP revealed a 2.8 gm/dL monoclonal spike.  B12 was 364.  Beta-2 microglobulin was 4.6.  Fasting lipid panel on 04/25/2016 revealed a cholesterol of 149, triglycerides 273 (<150), HDL 23 (>40), VDL 55 (<40), and LDL 91 (< 99).  Symptomatically, feels fine.  He had a great day yesterday.  He denies any melena or hematochezia.   Past Medical History:  Diagnosis Date  . Anemia   . Atherosclerotic heart disease    s/p MI, s/p stent mid circumflex  . CAD (coronary artery disease)    Dr Nehemiah Massed, cardiologist  . Cirrhosis of liver ([place])   . Clavicle fracture   . Diabetes mellitus without complication ([place])    oral med  . Elevated blood sugar    02/03/16 and 02/09/16 had BS over 400.  otherwise running around 250  . Esophageal varices ([place])   . Hepatic encephalopathy ([place])   . Hypercholesterolemia   . Hypertension    controlled on meds  . Internal hemorrhoids   . Leg fracture   . Multiple myeloma ([place])   . Myocardial infarct   . Prostate enlargement   . Tubular adenoma of colon     Past Surgical  History:  Procedure Laterality Date  . COLONOSCOPY WITH PROPOFOL N/A 03/04/2015   Procedure: COLONOSCOPY WITH PROPOFOL;  Surgeon: Jerene Bears, MD;  Location: [facility];  Service: Gastroenterology;  Laterality: N/A;  . COLONOSCOPY WITH PROPOFOL N/A 12/09/2015   Procedure: COLONOSCOPY WITH PROPOFOL;  Surgeon: [name], MD;  Location: [facility];  Service: Endoscopy;  Laterality: N/A;  DIABETIC-ORAL MEDS  . CORONARY ANGIOPLASTY WITH STENT PLACEMENT     2 stents  . ESOPHAGOGASTRODUODENOSCOPY (EGD) WITH PROPOFOL N/A 03/04/2015   Procedure: ESOPHAGOGASTRODUODENOSCOPY (EGD) WITH PROPOFOL;  Surgeon: Jerene Bears, MD;  Location: [facility];  Service: Gastroenterology;  Laterality: N/A;  . ESOPHAGOGASTRODUODENOSCOPY (EGD) WITH PROPOFOL N/A 12/09/2015   Procedure: ESOPHAGOGASTRODUODENOSCOPY (EGD) WITH PROPOFOL;  Surgeon: [name], MD;  Location: [facility];  Service: Endoscopy;  Laterality: N/A;  . ESOPHAGOGASTRODUODENOSCOPY (EGD) WITH PROPOFOL N/A 12/21/2015   Procedure: ESOPHAGOGASTRODUODENOSCOPY (EGD) WITH PROPOFOL;  Surgeon: [name], MD;  Location: [facility];  Service: Endoscopy;  Laterality: N/A;  . ESOPHAGOGASTRODUODENOSCOPY (EGD) WITH PROPOFOL N/A 02/24/2016   Procedure: ESOPHAGOGASTRODUODENOSCOPY (EGD) WITH PROPOFOL;  Surgeon: [name], MD;  Location: [facility];  Service: Endoscopy;  Laterality: N/A;  diabetic - oral med  . POLYPECTOMY N/A 12/09/2015   Procedure: POLYPECTOMY;  Surgeon: [name], MD;  Location: [facility];  Service: Endoscopy;  Laterality:  N/A;  . TONSILLECTOMY      Family History  Problem Relation Age of Onset  . Aneurysm Father 74    of the heart  . Heart attack Father   . Diabetes Mother     Social History:  reports that he has never smoked. He has never used smokeless tobacco. He reports that he does not drink alcohol or use drugs.  Patient denies any exposure to radiation or toxins.  He is a Company secretary.  He lives  in [place].  The patient is accompanied by his wife, Zachary Buck, today.  Allergies:  Allergies  Allergen Reactions  . Niaspan [Niacin Er] Other (See Comments)    Just does not want to take     Current Medications: Current Outpatient Prescriptions  Medication Sig Dispense Refill  . Alpha-Lipoic Acid (LIPOIC ACID PO) Take 200 mg by mouth daily.     . Ascorbic Acid (VITAMIN C) 1000 MG tablet Take 1,000 mg by mouth daily.    . B Complex Vitamins (VITAMIN B COMPLEX) TABS Take by mouth daily.    . blood glucose meter kit and supplies KIT Dispense based on patient and insurance preference. Use up to four times daily as directed. (FOR ICD-9 250.00, 250.01). 1 each 0  . blood glucose meter kit and supplies KIT Dispense based on patient and insurance preference. Use up to four times daily as directed. E11.9 Please dispense Freestyle freedom meter and supplies. 1 each 0  . Cholecalciferol (VITAMIN D-3) 1000 UNITS CAPS Take by mouth.    . CHROMIUM PO Take 600 mg by mouth daily.     . Cinnamon 500 MG capsule Take 500 mg by mouth.    . Coenzyme Q10 100 MG capsule Take by mouth.    . furosemide (LASIX) 20 MG tablet Take 1 tablet (20 mg total) by mouth 2 (two) times daily. 60 tablet 3  . Ginger, Zingiber officinalis, (GINGER ROOT) 550 MG CAPS Take by mouth daily.    Marland Kitchen glipiZIDE (GLUCOTROL) 5 MG tablet TAKE 1 TABLET BY MOUTH EVERY MORNING AND 2 TABLETS EVERY EVENING 270 tablet 2  . glucose blood (BAYER CONTOUR NEXT TEST) test strip Contour Next EZ test strips: check blood sugars twice a day. Dx: 250.00    . glucose blood (ONE TOUCH ULTRA TEST) test strip Use as instructed 100 each 12  . Insulin Syringe-Needle U-100 (INSULIN SYRINGE .5CC/31GX5/16") 31G X 5/16" 0.5 ML MISC Will be using a SSI - for elevated blood sugars. 100 each 0  . lactulose (CHRONULAC) 10 GM/15ML solution Take 15 mLs (10 g total) by mouth 2 (two) times daily. Titrate to 2 soft stools per day. Decrease if you start having diarrhea 946 mL  3  . Lancets (ONETOUCH ULTRASOFT) lancets Use as instructed 100 each 12  . lisinopril (PRINIVIL,ZESTRIL) 20 MG tablet TAKE ONE TABLET BY MOUTH EVERY DAY/ AM    . Magnesium 500 MG CAPS Take 500 mg by mouth daily.    . metFORMIN (GLUCOPHAGE) 1000 MG tablet TAKE 1 TABLET(1000 MG) BY MOUTH TWICE DAILY WITH A MEAL 60 tablet 5  . metFORMIN (GLUCOPHAGE) 1000 MG tablet TAKE 1 TABLET(1000 MG) BY MOUTH TWICE DAILY WITH A MEAL 60 tablet 2  . NOVOLOG 100 UNIT/ML injection USE SLIDING SCALE AS OUTLINED BY PRESCRIBER  1  . Omega-3 Fatty Acids (FISH OIL) 1000 MG CAPS Take 4,000 mg by mouth daily.     Marland Kitchen OVER THE COUNTER MEDICATION BUPLEURUM    . OVER THE COUNTER MEDICATION 250  MG CITICOLINE    . OVER THE COUNTER MEDICATION     . OVER THE COUNTER MEDICATION     . OVER THE COUNTER MEDICATION     . OVER THE COUNTER MEDICATION     . SBI/Protein Isolate (ENTERAGAM) 5 g PACK Take 1 packet by mouth daily. 30 each 3  . sildenafil (REVATIO) 20 MG tablet 3 to 5 tablets per day or as instructed    . spironolactone (ALDACTONE) 50 MG tablet Take 1 tablet (50 mg total) by mouth daily. 30 tablet 3  . Theanine 100 MG CAPS Take by mouth daily.    Marland Kitchen triamcinolone cream (KENALOG) 0.1 % Apply topically 2 (two) times daily. Please avoid face and genitalia area.  Do not use in the same spot for more than 7-10 days in a row. 80 g 0  . Turmeric POWD 4,000 mg by Does not apply route daily.    . vitamin B-12 (CYANOCOBALAMIN) 100 MCG tablet Take 200 mcg by mouth daily.    . insulin lispro (HUMALOG) 100 UNIT/ML injection Use sliding scale as directed.  (i.e., if blood sugar 251-300 - give 2 units, etc as outlined. (Patient not taking: Reported on 05/02/2016) 10 mL 1   No current facility-administered medications for this visit.    Facility-Administered Medications Ordered in Other Visits  Medication Dose Route Frequency Provider Last Rate Last Dose  . alteplase (CATHFLO ACTIVASE) injection 2 mg  2 mg Intracatheter Once PRN Lequita Asal, MD      . heparin lock flush 100 unit/mL  500 Units Intracatheter Once PRN Lequita Asal, MD      . heparin lock flush 100 unit/mL  250 Units Intracatheter Once PRN Lequita Asal, MD      . sodium chloride 0.9 % injection 10 mL  10 mL Intracatheter PRN Lequita Asal, MD      . sodium chloride 0.9 % injection 3 mL  3 mL Intravenous Once PRN Lequita Asal, MD        Review of Systems:  GENERAL:  Feels fgreat.  No fevers or sweats.  Weight up 1 pound. PERFORMANCE STATUS (ECOG):  1 HEENT:  No visual changes, runny nose, sore throat, mouth sores or tenderness. Lungs: No shortness of breath or cough.  No hemoptysis. Cardiac:  No chest pain, palpitations, orthopnea, or PND. GI:  Eating well.  No abdominal pain.  No nausea, vomiting,constipation, melena or hematochezia.  GU:  No urgency, frequency, dysuria, or hematuria. Musculoskeletal:  No back pain.  No joint pain.  No muscle tenderness. Extremities:  No pain or swelling. Skin:  No rashes or skin changes. Neuro:  Short term memory issues.  No headache, numbness or weakness, balance or coordination issues. Endocrine:  Diabetes.  No thyroid issues, hot flashes or night sweats. Psych:  No mood changes, depression or anxiety. Pain:  No pain. Review of systems:  All other systems reviewed and found to be negative.  Physical Exam: BP (!) 144/68 (BP Location: Right Arm, Patient Position: Sitting)   Pulse 82   Temp 97.6 F (36.4 C) (Tympanic)   Resp 18   Wt 175 lb 4.3 oz (79.5 kg)   BMI 25.88 kg/m   GENERAL:  Well developed, well nourished, gentleman sitting comfortably in the exam room in no acute distress. MENTAL STATUS:  Alert and oriented to person, place and time. HEAD:  Pearline Cables hair.  Normocephalic, atraumatic, face symmetric, no Cushingoid features. EYES:  Glasses.  Blue eyes.  Pupils equal round and reactive to light and accomodation.  No conjunctivitis or scleral icterus. ENT:  Oropharynx clear without  lesion.  Tongue normal. Mucous membranes moist.  RESPIRATORY:  Clear to auscultation without rales, wheezes or rhonchi. CARDIOVASCULAR:  Regular rate and rhythm without murmur, rub or gallop. ABDOMEN:  Soft, non tender with active bowel sounds, and no appreciable hepatosplenomegaly.  No masses. SKIN:  No rashes, ulcers or lesions. EXTREMITIES: No edema, no skin discoloration or tenderness.  No palpable cords. LYMPH NODES: No palpable cervical, supraclavicular, axillary or inguinal adenopathy  NEUROLOGICAL: Unremarkable. PSYCH:  Appropriate.    No visits with results within 3 Day(s) from this visit.  Latest known visit with results is:  Appointment on 04/25/2016  Component Date Value Ref Range Status  . Cholesterol 04/25/2016 149  0 - 200 mg/dL Final  . Triglycerides 04/25/2016 273* <150 mg/dL Final  . HDL 04/25/2016 23* >40 mg/dL Final  . Total CHOL/HDL Ratio 04/25/2016 6.5  RATIO Final  . VLDL 04/25/2016 55* 0 - 40 mg/dL Final  . LDL Cholesterol 04/25/2016 71  0 - 99 mg/dL Final   Comment:        Total Cholesterol/HDL:CHD Risk Coronary Heart Disease Risk Table                     Men   Women  1/2 Average Risk   3.4   3.3  Average Risk       5.0   4.4  2 X Average Risk   9.6   7.1  3 X Average Risk  23.4   11.0        Use the calculated Patient Ratio above and the CHD Risk Table to determine the patient's CHD Risk.        ATP III CLASSIFICATION (LDL):  <100     mg/dL   Optimal  100-129  mg/dL   Near or Above                    Optimal  130-159  mg/dL   Borderline  160-189  mg/dL   High  >190     mg/dL   Very High     Assessment:  Zachary Buck is a 76 y.o. male with iron deficiency anemia and stage II multiple myeloma. 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 Serum immunoglobulins noted an IgG of 2930 (high). 24-hour urine revealed a 8.8% monoclonal protein (10.6 mg/24  hours).  Albumen was 3.3 on 08/26/2014. He had pneumonia in 05/2014.   Bone survey on 10/20/2014 revealed no lytic lesions. Bone survey on 09/16/2015 revealed no focal lytic lesion or acute bony abnormality.   SPEP has been followed:  2.3 gm/dL on 10/09/2014, 2.3 gm/dL on 01/18/2015, 2.3 gm/dL on 03/22/2015, 2.3 gm/dL on 06/18/2015, 2.3 gm/dL on 09/13/2015, 2.3 gm/dL on 11/08/2015, 2.7 gm/dL on 01/11/2016, 2.8 gm/dL on 02/17/2016, 2.4 gm/dL on 03/28/2016, and 2.8 gm/dL on 04/24/2016.   Lambda free light chainswere155.3 (ratio 0.15) on 01/18/2015, 174.59 (ratio of 0.16)on 06/18/2015, 182.9 (ratio of 0.13)on 09/13/2015, 205 (ratio 0.11) on 11/08/2015, 292.7 (ratio 0.09) on 01/11/2016, and 167.0 (ratio 0.13) on 03/27/2016.  Bone marrow on 11/10/2014 revealed a 10% monoclonal plasma cell infiltrate. Marrow was hypercellular for age (40-50%) with mixed maturing hematopoiesis, relative erythroid hyperplasia and mild nonspecific dyserythropoiesis. There was patchy mild focally moderate increase in reticulin. There were no significant iron stores.  Myeloma FISH panel revealed t(11;14) which results in fusion of CCND1 (BCL1) at 11q13 with the immunoglobulin heavy chain gene (IgH) at 14q32 (a favorable prognostic feature).   Bone marrow on 03/14/2016 revealed persistent monoclonal plasma cell infiltrate of 10-15%.  Marrow was hypercellular for age (40-50%) with trilineage hematopoiesis, increase eosinophils, adequate megakaryocytes and no increase in blasts. There was mild patchy increase in reticulin fibers. There was stainable iron consistent with recent parenteral iron.  Cytogenetics were normal (46, XY). FISH studies revealed CCND1/IGH translocation t(11;14).  Beta2 microglobulin was 4.2 on 11/24/2014, 3.8 on 06/18/2015, 4.5 on 09/13/2015, 4.5 on 10/18/2015, and 4.6 on 04/24/2016.  PET scan on 12/07/2014 revealed no hypermetabolic foci within the marrow space or nodal stations to suggest active  multiple myeloma. There was hypermetabolism within the right lower lobe, corresponding to a similar dependent density. Morphology favored scarring.There was cirrhosis and portal hypertension. There was gastric body hypermetabolism favoring physiologic. Spleen was 15.3 cm (volume 1065 cc).  There was pulmonary artery enlargement suggesting pulmonary artery hypertension. 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  He has a history of asbestos exposure in the [employer].  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  Etiology is felt secondary to fatty liver.  Abdominal CT scan on 03/16/2015 revealed splenomegaly (14.5 cm), cirrhosis, and stigmata of portal hypertension with esophageal and gastric varices.  There were no liver lesions.  He is on aldactone.  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  There were no pleural effusions.  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  There was no frank honeycombing. This finding accounted for the medial right lower lobe hypermetabolism on the 12/07/2014 PET-CT.  There was cirrhosis, splenomegaly, and prominent gastroesophageal varices.  He has had mild anemia since 10/2013 and mild thrombocytopenia since 05/2014.   Colonoscopy in 10/2013 revealed polyps.  Colonoscopy on 03/04/2015 revealed a sessile polyp was seen in the transverse colon (tubular adenoma).  Colonoscopy on 12/09/2015 revealed one 4 mm polyp in the ascending colon and two 4-5 mm polyps in the sigmoid colon (hyperplastic polyps).    EGD in 10/2013 revealed gastritis. EGD on 03/04/2015 revealed medium-sized esophageal varices in the mid and distal esophagus without bleeding. There was a sessile polyp in the gastric antrum  (hyperplastic polyp).  There were 3 small angiodysplastic lesions in the second part of the duodenum.  EGD on 12/09/2015 revealed grade II esophageal varices.  There was a single gastric polyp.  There was a single bleeding angioectasia in the duodenum treated with bipolar cautery and clips.  EGD on 02/24/2016 revealed no gross lesions in the esophagus. There was a single gastric polyp and a duodenal polyp. Resection was attempted. No specimens were collected  Capsule enteroscopy on 11/27/2014 revealed 1-2 gastric polyps and one colon polyp.  There were no findings to explain iron deficiency anemia.  His diet was good. Labs confirmed iron deficiency (ferritin 11.8 on 05/2014 and 13 on 10/09/2014). B12 was low normal (MMA normal) thus ruling out B12 deficiency.  He has chronic diarrhea (2 years).  Diarrhea improved on Enterogam (began 06/22/2015).  He has intermittent diarrhea.    He has recurrent iron deficiency anemia.  He has received Venofer weekly 3 (03/26/2015 - 04/14/2015), weekly x 3 (06/25/2015 - 07/09/2015), weekly x 3 (09/17/2015 - 10/01/2015), weekly x 3 (11/16/2015 - 11/30/2015), weekly  x 2 (01/18/2016 - 01/25/2016),  weekly x 3 (02/18/2016 - 03/03/2016).    Ferritin has been followed:  23 on 06/18/2015, 121 on 07/26/2015, 17 on 09/13/2015, 18 on 11/15/2015, 52 on 12/13/2015, 10 on 01/11/2016, 88 on 02/03/2016, 23 on 02/17/2016, 46 on 03/27/2016, and 20 on 04/24/2016.  Symptomatically, he feels good.  He denies any pain.  Exam is stable.  Ferritin is again drifting down.  Plan: 1.  Review labs from 04/24/2016.  Discuss declining ferritin.  Etiology unclear. 2.  Guaiac cards x 3 3.  Spoke with Dr. Allen Norris.  Please schedule appt with Dr. Allen Norris. 4.  RTC for Venofer 5.  RTC in 1 month for MD assessment and labs (CBC with diff, CMP, SPEP, ferritin-day before).   Lequita Asal, MD  05/02/2016, 10:08 AM

## 2016-05-03 ENCOUNTER — Ambulatory Visit (INDEPENDENT_AMBULATORY_CARE_PROVIDER_SITE_OTHER): Payer: PPO | Admitting: Internal Medicine

## 2016-05-03 ENCOUNTER — Encounter: Payer: Self-pay | Admitting: Internal Medicine

## 2016-05-03 DIAGNOSIS — E7800 Pure hypercholesterolemia, unspecified: Secondary | ICD-10-CM

## 2016-05-03 DIAGNOSIS — I2510 Atherosclerotic heart disease of native coronary artery without angina pectoris: Secondary | ICD-10-CM

## 2016-05-03 DIAGNOSIS — E119 Type 2 diabetes mellitus without complications: Secondary | ICD-10-CM | POA: Diagnosis not present

## 2016-05-03 DIAGNOSIS — D696 Thrombocytopenia, unspecified: Secondary | ICD-10-CM

## 2016-05-03 DIAGNOSIS — C9000 Multiple myeloma not having achieved remission: Secondary | ICD-10-CM

## 2016-05-03 DIAGNOSIS — K766 Portal hypertension: Secondary | ICD-10-CM

## 2016-05-03 DIAGNOSIS — I10 Essential (primary) hypertension: Secondary | ICD-10-CM

## 2016-05-03 DIAGNOSIS — D649 Anemia, unspecified: Secondary | ICD-10-CM | POA: Diagnosis not present

## 2016-05-03 DIAGNOSIS — I8510 Secondary esophageal varices without bleeding: Secondary | ICD-10-CM

## 2016-05-03 NOTE — Telephone Encounter (Signed)
Refilled: 02/04/16 Last OV: today 05/03/16 Last Labs: 12/17/15 for A1C and 03/14/16 for capillary glucose. Future OV: 09/15/16 Please advise?

## 2016-05-03 NOTE — Telephone Encounter (Signed)
rx ok'd for metformin #30 with 3 refills.

## 2016-05-03 NOTE — Progress Notes (Signed)
Patient ID: Zachary Buck, male   DOB: Nov 05, 1940, 76 y.o.   MRN: [id number]   Subjective:    Patient ID: Zachary Buck, male    DOB: 08-21-1940, 76 y.o.   MRN: [id number]  HPI  Patient here for a scheduled follow up.  He saw Dr Mike Gip yesterday.  Note reviewed.  Discussed further treatment.  Elected observation at this time.  He returns tomorrow for iron infusion.  States he is doing relatively well.  No chest pain.  No sob.  No acid reflux.  Persistent abdominal pain.  No bleeding.  Sugars still elevated, but remaining in the 200 range. Overall feels he is thinking better.  Walking better.     Past Medical History:  Diagnosis Date  . Anemia   . Atherosclerotic heart disease    s/p MI, s/p stent mid circumflex  . CAD (coronary artery disease)    Dr Nehemiah Massed, cardiologist  . Cirrhosis of liver ([place])   . Clavicle fracture   . Diabetes mellitus without complication ([place])    oral med  . Elevated blood sugar    02/03/16 and 02/09/16 had BS over 400.  otherwise running around 250  . Esophageal varices ([place])   . Hepatic encephalopathy ([place])   . Hypercholesterolemia   . Hypertension    controlled on meds  . Internal hemorrhoids   . Leg fracture   . Multiple myeloma ([place])   . Myocardial infarct   . Prostate enlargement   . Tubular adenoma of colon    Past Surgical History:  Procedure Laterality Date  . COLONOSCOPY WITH PROPOFOL N/A 03/04/2015   Procedure: COLONOSCOPY WITH PROPOFOL;  Surgeon: Jerene Bears, MD;  Location: [facility];  Service: Gastroenterology;  Laterality: N/A;  . COLONOSCOPY WITH PROPOFOL N/A 12/09/2015   Procedure: COLONOSCOPY WITH PROPOFOL;  Surgeon: [name], MD;  Location: [place];  Service: Endoscopy;  Laterality: N/A;  DIABETIC-ORAL MEDS  . CORONARY ANGIOPLASTY WITH STENT PLACEMENT     2 stents  . ESOPHAGOGASTRODUODENOSCOPY (EGD) WITH PROPOFOL N/A 03/04/2015   Procedure: ESOPHAGOGASTRODUODENOSCOPY (EGD) WITH PROPOFOL;   Surgeon: Jerene Bears, MD;  Location: [facility];  Service: Gastroenterology;  Laterality: N/A;  . ESOPHAGOGASTRODUODENOSCOPY (EGD) WITH PROPOFOL N/A 12/09/2015   Procedure: ESOPHAGOGASTRODUODENOSCOPY (EGD) WITH PROPOFOL;  Surgeon: [name], MD;  Location: [place];  Service: Endoscopy;  Laterality: N/A;  . ESOPHAGOGASTRODUODENOSCOPY (EGD) WITH PROPOFOL N/A 12/21/2015   Procedure: ESOPHAGOGASTRODUODENOSCOPY (EGD) WITH PROPOFOL;  Surgeon: [name], MD;  Location: [facility];  Service: Endoscopy;  Laterality: N/A;  . ESOPHAGOGASTRODUODENOSCOPY (EGD) WITH PROPOFOL N/A 02/24/2016   Procedure: ESOPHAGOGASTRODUODENOSCOPY (EGD) WITH PROPOFOL;  Surgeon: [name], MD;  Location: [place];  Service: Endoscopy;  Laterality: N/A;  diabetic - oral med  . POLYPECTOMY N/A 12/09/2015   Procedure: POLYPECTOMY;  Surgeon: [name], MD;  Location: [place];  Service: Endoscopy;  Laterality: N/A;  . TONSILLECTOMY     Family History  Problem Relation Age of Onset  . Aneurysm Father 1    of the heart  . Heart attack Father   . Diabetes Mother    Social History   Social History  . Marital status: Married    Spouse name: N/A  . Number of children: 2  . Years of education: N/A   Occupational History  . Estate manager/land agent   Social History Main Topics  . Smoking status: Never Smoker  . Smokeless tobacco: Never Used  . Alcohol use No  . Drug  use: No  . Sexual activity: Not Asked   Other Topics Concern  . None   Social History Narrative  . None    Outpatient Encounter Prescriptions as of 05/03/2016  Medication Sig  . Alpha-Lipoic Acid (LIPOIC ACID PO) Take 200 mg by mouth daily.   . Ascorbic Acid (VITAMIN C) 1000 MG tablet Take 1,000 mg by mouth daily.  . B Complex Vitamins (VITAMIN B COMPLEX) TABS Take by mouth daily.  . blood glucose meter kit and supplies KIT Dispense based on patient and insurance preference. Use up to four times daily as directed.  (FOR ICD-9 250.00, 250.01).  . blood glucose meter kit and supplies KIT Dispense based on patient and insurance preference. Use up to four times daily as directed. E11.9 Please dispense Freestyle freedom meter and supplies.  . Cholecalciferol (VITAMIN D-3) 1000 UNITS CAPS Take by mouth.  . CHROMIUM PO Take 600 mg by mouth daily.   . Cinnamon 500 MG capsule Take 500 mg by mouth.  . Coenzyme Q10 100 MG capsule Take by mouth.  . furosemide (LASIX) 20 MG tablet Take 1 tablet (20 mg total) by mouth 2 (two) times daily.  . Ginger, Zingiber officinalis, (GINGER ROOT) 550 MG CAPS Take by mouth daily.  Marland Kitchen glipiZIDE (GLUCOTROL) 5 MG tablet TAKE 1 TABLET BY MOUTH EVERY MORNING AND 2 TABLETS EVERY EVENING  . glucose blood (BAYER CONTOUR NEXT TEST) test strip Contour Next EZ test strips: check blood sugars twice a day. Dx: 250.00  . glucose blood (ONE TOUCH ULTRA TEST) test strip Use as instructed  . insulin lispro (HUMALOG) 100 UNIT/ML injection Use sliding scale as directed.  (i.e., if blood sugar 251-300 - give 2 units, etc as outlined.  . Insulin Syringe-Needle U-100 (INSULIN SYRINGE .5CC/31GX5/16") 31G X 5/16" 0.5 ML MISC Will be using a SSI - for elevated blood sugars.  . lactulose (CHRONULAC) 10 GM/15ML solution Take 15 mLs (10 g total) by mouth 2 (two) times daily. Titrate to 2 soft stools per day. Decrease if you start having diarrhea  . Lancets (ONETOUCH ULTRASOFT) lancets Use as instructed  . lisinopril (PRINIVIL,ZESTRIL) 20 MG tablet TAKE ONE TABLET BY MOUTH EVERY DAY/ AM  . Magnesium 500 MG CAPS Take 500 mg by mouth daily.  . metFORMIN (GLUCOPHAGE) 1000 MG tablet TAKE 1 TABLET(1000 MG) BY MOUTH TWICE DAILY WITH A MEAL  . NOVOLOG 100 UNIT/ML injection USE SLIDING SCALE AS OUTLINED BY PRESCRIBER  . Omega-3 Fatty Acids (FISH OIL) 1000 MG CAPS Take 4,000 mg by mouth daily.   Marland Kitchen OVER THE COUNTER MEDICATION BUPLEURUM  . OVER THE COUNTER MEDICATION 250 MG CITICOLINE  . OVER THE COUNTER MEDICATION   .  OVER THE COUNTER MEDICATION   . OVER THE COUNTER MEDICATION   . OVER THE COUNTER MEDICATION   . SBI/Protein Isolate (ENTERAGAM) 5 g PACK Take 1 packet by mouth daily.  . sildenafil (REVATIO) 20 MG tablet 3 to 5 tablets per day or as instructed  . spironolactone (ALDACTONE) 50 MG tablet Take 1 tablet (50 mg total) by mouth daily.  . Theanine 100 MG CAPS Take by mouth daily.  Marland Kitchen triamcinolone cream (KENALOG) 0.1 % Apply topically 2 (two) times daily. Please avoid face and genitalia area.  Do not use in the same spot for more than 7-10 days in a row.  . Turmeric POWD 4,000 mg by Does not apply route daily.  . vitamin B-12 (CYANOCOBALAMIN) 100 MCG tablet Take 200 mcg by mouth daily.  . [  DISCONTINUED] metFORMIN (GLUCOPHAGE) 1000 MG tablet TAKE 1 TABLET(1000 MG) BY MOUTH TWICE DAILY WITH A MEAL   Facility-Administered Encounter Medications as of 05/03/2016  Medication  . alteplase (CATHFLO ACTIVASE) injection 2 mg  . heparin lock flush 100 unit/mL  . heparin lock flush 100 unit/mL  . sodium chloride 0.9 % injection 10 mL  . sodium chloride 0.9 % injection 3 mL    Review of Systems  Constitutional: Negative for appetite change and unexpected weight change.  HENT: Negative for congestion and sinus pressure.   Respiratory: Negative for cough, chest tightness and shortness of breath.   Cardiovascular: Negative for chest pain, palpitations and leg swelling.  Gastrointestinal: Positive for abdominal pain. Negative for nausea and vomiting.  Genitourinary: Negative for difficulty urinating and dysuria.  Musculoskeletal: Negative for joint swelling and myalgias.  Skin: Negative for color change and rash.  Neurological: Negative for dizziness, light-headedness and headaches.  Psychiatric/Behavioral: Negative for agitation and dysphoric mood.       Objective:    Physical Exam  Constitutional: He appears well-developed and well-nourished. No distress.  HENT:  Nose: Nose normal.  Mouth/Throat:  Oropharynx is clear and moist.  Neck: Neck supple. No thyromegaly present.  Cardiovascular: Normal rate and regular rhythm.   Pulmonary/Chest: Effort normal and breath sounds normal. No respiratory distress.  Abdominal: Soft. Bowel sounds are normal.  Chronic tenderness - unchanged.    Musculoskeletal: He exhibits no edema or tenderness.  Lymphadenopathy:    He has no cervical adenopathy.  Skin: No rash noted. No erythema.  Psychiatric: He has a normal mood and affect. His behavior is normal.    BP 138/64 (BP Location: Left Arm, Patient Position: Sitting, Cuff Size: Normal)   Pulse 77   Temp 98.6 F (37 C) (Oral)   Resp 16   Ht _0  (1.753 m)   Wt 174 lb 9.6 oz (79.2 kg)   SpO2 93%   BMI 25.78 kg/m  Wt Readings from Last 3 Encounters:  05/03/16 174 lb 9.6 oz (79.2 kg)  05/02/16 175 lb 4.3 oz (79.5 kg)  03/28/16 174 lb 2.6 oz (79 kg)     Lab Results  Component Value Date   WBC 5.5 04/24/2016   HGB 12.0 (L) 04/24/2016   HCT 32.7 (L) 04/24/2016   PLT 109 (L) 04/24/2016   GLUCOSE 291 (H) 04/24/2016   CHOL 149 04/25/2016   TRIG 273 (H) 04/25/2016   HDL 23 (L) 04/25/2016   LDLDIRECT 56.0 12/17/2015   LDLCALC 71 04/25/2016   ALT 29 04/24/2016   AST 32 04/24/2016   NA UNABLE TO REPORT DUE TO LIPEMIC INTERFERENCE 04/24/2016   K 4.8 04/24/2016   CL 106 04/24/2016   CREATININE 1.00 04/24/2016   BUN 25 (H) 04/24/2016   CO2 20 (L) 04/24/2016   TSH 6.49 (H) 12/17/2015   INR 1.1 (H) 08/25/2015   HGBA1C 6.8 (H) 12/17/2015   MICROALBUR 22.4 (H) 04/29/2015       Assessment & Plan:   Problem List Items Addressed This Visit    Anemia    Has had extensive GI w/up.  Receiving IV iron.  Followed by oncology.  Being referred back to GI.        CAD (coronary artery disease)    Stable.        Diabetes ([place])    Sugars as outlined.  Hold on making changes.  Follow met b and a1c.        Essential hypertension, benign  Blood pressure under good control.  Continue same  medication regimen.  Follow pressures.  Follow metabolic panel.        Hypercholesterolemia    Follow lipid panel.  Last triglyceride level overall improved.  Follow.        Multiple myeloma ([place])    Followed by hematology.        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  Was on nadolol.  Off now.  Being followed by GI.        Secondary esophageal varices without bleeding ([facility])    Evaluated by Dr Allen Norris.  Being referred back.        Thrombocytopenia ([place])    Followed by hematology.            Einar Pheasant, MD

## 2016-05-03 NOTE — Progress Notes (Signed)
Pre-visit discussion using our clinic review tool. No additional management support is needed unless otherwise documented below in the visit note.  

## 2016-05-04 ENCOUNTER — Other Ambulatory Visit: Payer: Self-pay | Admitting: Hematology and Oncology

## 2016-05-04 ENCOUNTER — Inpatient Hospital Stay: Payer: PPO

## 2016-05-04 DIAGNOSIS — C9000 Multiple myeloma not having achieved remission: Secondary | ICD-10-CM | POA: Diagnosis not present

## 2016-05-04 MED ORDER — SODIUM CHLORIDE 0.9 % IV SOLN
Freq: Once | INTRAVENOUS | Status: AC
  Administered 2016-05-04: 09:00:00 via INTRAVENOUS
  Filled 2016-05-04: qty 1000

## 2016-05-04 MED ORDER — IRON SUCROSE 20 MG/ML IV SOLN
200.0000 mg | Freq: Once | INTRAVENOUS | Status: AC
  Administered 2016-05-04: 200 mg via INTRAVENOUS
  Filled 2016-05-04: qty 10

## 2016-05-05 DIAGNOSIS — C9000 Multiple myeloma not having achieved remission: Secondary | ICD-10-CM | POA: Diagnosis not present

## 2016-05-06 ENCOUNTER — Encounter: Payer: Self-pay | Admitting: Hematology and Oncology

## 2016-05-06 DIAGNOSIS — C9000 Multiple myeloma not having achieved remission: Secondary | ICD-10-CM | POA: Diagnosis not present

## 2016-05-07 ENCOUNTER — Encounter: Payer: Self-pay | Admitting: Internal Medicine

## 2016-05-07 NOTE — Assessment & Plan Note (Signed)
Sugars as outlined.  Hold on making changes.  Follow met b and a1c.

## 2016-05-07 NOTE — Assessment & Plan Note (Signed)
Has had extensive GI w/up.  Receiving IV iron.  Followed by oncology.  Being referred back to GI.

## 2016-05-07 NOTE — Assessment & Plan Note (Signed)
Follow lipid panel.  Last triglyceride level overall improved.  Follow.

## 2016-05-07 NOTE — Assessment & Plan Note (Signed)
Followed by hematology 

## 2016-05-07 NOTE — Assessment & Plan Note (Signed)
Has cryptogenic cirrhosis with portal hypertension.  Was on nadolol.  Off now.  Being followed by GI.

## 2016-05-07 NOTE — Assessment & Plan Note (Signed)
Stable

## 2016-05-07 NOTE — Assessment & Plan Note (Signed)
Evaluated by Dr Allen Norris.  Being referred back.

## 2016-05-07 NOTE — Assessment & Plan Note (Signed)
Blood pressure under good control.  Continue same medication regimen.  Follow pressures.  Follow metabolic panel.   

## 2016-05-08 ENCOUNTER — Other Ambulatory Visit: Payer: Self-pay | Admitting: *Deleted

## 2016-05-08 DIAGNOSIS — C9000 Multiple myeloma not having achieved remission: Secondary | ICD-10-CM

## 2016-05-08 DIAGNOSIS — D500 Iron deficiency anemia secondary to blood loss (chronic): Secondary | ICD-10-CM

## 2016-05-08 LAB — OCCULT BLOOD X 1 CARD TO LAB, STOOL
Fecal Occult Bld: NEGATIVE
Fecal Occult Bld: NEGATIVE
Fecal Occult Bld: NEGATIVE

## 2016-05-11 ENCOUNTER — Other Ambulatory Visit: Payer: Self-pay

## 2016-05-15 ENCOUNTER — Encounter: Payer: Self-pay | Admitting: Gastroenterology

## 2016-05-15 ENCOUNTER — Ambulatory Visit (INDEPENDENT_AMBULATORY_CARE_PROVIDER_SITE_OTHER): Payer: PPO | Admitting: Gastroenterology

## 2016-05-15 VITALS — BP 167/81 | HR 73 | Temp 98.0°F | Ht 69.0 in | Wt 174.0 lb

## 2016-05-15 DIAGNOSIS — D509 Iron deficiency anemia, unspecified: Secondary | ICD-10-CM | POA: Diagnosis not present

## 2016-05-15 NOTE — Progress Notes (Signed)
Primary Care Physician: Einar Pheasant, MD  Primary Gastroenterologist:  Dr. [name]  Chief Complaint  Patient presents with  . Anemia    HPI: Zachary Buck is a 76 y.o. male here due to low iron stores. The patient has had low iron and low ferritin. The patient has had multiple esophageal banding procedures for esophageal varices. The last one showed total obliteration of his esophageal varices. The patient states that he did stool cards and was told that all 3 done on separate occasions were all negative. The patient also denies any visual blood loss. The patient has had a capsule endoscopy to look for small bowel bleeding back in 2016. The patient has also had a colonoscopy and multiple EGDs. The patient states that he has multiple myeloma and his bone marrow biopsy appears to have shown him to have normocytic normochromic cellularity. The patient's most recent hemoglobin was also 12.0 which is higher than he has been in many months.  Current Outpatient Prescriptions  Medication Sig Dispense Refill  . Alpha-Lipoic Acid (LIPOIC ACID PO) Take 200 mg by mouth daily.     . Ascorbic Acid (VITAMIN C) 1000 MG tablet Take 1,000 mg by mouth daily.    . B Complex Vitamins (VITAMIN B COMPLEX) TABS Take by mouth daily.    . blood glucose meter kit and supplies KIT Dispense based on patient and insurance preference. Use up to four times daily as directed. (FOR ICD-9 250.00, 250.01). 1 each 0  . blood glucose meter kit and supplies KIT Dispense based on patient and insurance preference. Use up to four times daily as directed. E11.9 Please dispense Freestyle freedom meter and supplies. 1 each 0  . Cholecalciferol (VITAMIN D-3) 1000 UNITS CAPS Take by mouth.    . CHROMIUM PO Take 600 mg by mouth daily.     . Cinnamon 500 MG capsule Take 500 mg by mouth.    . Coenzyme Q10 100 MG capsule Take by mouth.    . furosemide (LASIX) 20 MG tablet Take 1 tablet (20 mg total) by mouth 2 (two) times  daily. 60 tablet 3  . GENERLAC 10 GM/15ML SOLN TK 15 ML PO BID. TITRATE TO 2 SOFT STOOLS PER DAY. DECREASE IF YOU START HAVING DIARRHEA  3  . Ginger, Zingiber officinalis, (GINGER ROOT) 550 MG CAPS Take by mouth daily.    Marland Kitchen glipiZIDE (GLUCOTROL) 5 MG tablet TAKE 1 TABLET BY MOUTH EVERY MORNING AND 2 TABLETS EVERY EVENING 270 tablet 2  . glucose blood (BAYER CONTOUR NEXT TEST) test strip Contour Next EZ test strips: check blood sugars twice a day. Dx: 250.00    . glucose blood (ONE TOUCH ULTRA TEST) test strip Use as instructed 100 each 12  . insulin lispro (HUMALOG) 100 UNIT/ML injection Use sliding scale as directed.  (i.e., if blood sugar 251-300 - give 2 units, etc as outlined. 10 mL 1  . Insulin Syringe-Needle U-100 (INSULIN SYRINGE .5CC/31GX5/16") 31G X 5/16" 0.5 ML MISC Will be using a SSI - for elevated blood sugars. 100 each 0  . lactulose (CHRONULAC) 10 GM/15ML solution Take 15 mLs (10 g total) by mouth 2 (two) times daily. Titrate to 2 soft stools per day. Decrease if you start having diarrhea 946 mL 3  . Lancets (ONETOUCH ULTRASOFT) lancets Use as instructed 100 each 12  . lisinopril (PRINIVIL,ZESTRIL) 20 MG tablet TAKE ONE TABLET BY MOUTH EVERY DAY/ AM    . Magnesium 500 MG CAPS Take 500 mg by  mouth daily.    . metFORMIN (GLUCOPHAGE) 1000 MG tablet TAKE 1 TABLET(1000 MG) BY MOUTH TWICE DAILY WITH A MEAL 60 tablet 5  . metFORMIN (GLUCOPHAGE) 1000 MG tablet TAKE 1 TABLET(1000 MG) BY MOUTH TWICE DAILY WITH A MEAL 60 tablet 3  . NOVOLOG 100 UNIT/ML injection USE SLIDING SCALE AS OUTLINED BY PRESCRIBER  1  . Omega-3 Fatty Acids (FISH OIL) 1000 MG CAPS Take 4,000 mg by mouth daily.     Marland Kitchen OVER THE COUNTER MEDICATION BUPLEURUM    . OVER THE COUNTER MEDICATION 250 MG CITICOLINE    . OVER THE COUNTER MEDICATION     . OVER THE COUNTER MEDICATION     . OVER THE COUNTER MEDICATION     . OVER THE COUNTER MEDICATION     . SBI/Protein Isolate (ENTERAGAM) 5 g PACK Take 1 packet by mouth daily. 30  each 3  . sildenafil (REVATIO) 20 MG tablet 3 to 5 tablets per day or as instructed    . spironolactone (ALDACTONE) 50 MG tablet Take 1 tablet (50 mg total) by mouth daily. 30 tablet 3  . Theanine 100 MG CAPS Take by mouth daily.    Marland Kitchen triamcinolone cream (KENALOG) 0.1 % Apply topically 2 (two) times daily. Please avoid face and genitalia area.  Do not use in the same spot for more than 7-10 days in a row. 80 g 0  . Turmeric POWD 4,000 mg by Does not apply route daily.    . vitamin B-12 (CYANOCOBALAMIN) 100 MCG tablet Take 200 mcg by mouth daily.     No current facility-administered medications for this visit.    Facility-Administered Medications Ordered in Other Visits  Medication Dose Route Frequency Provider Last Rate Last Dose  . alteplase (CATHFLO ACTIVASE) injection 2 mg  2 mg Intracatheter Once PRN Lequita Asal, MD      . heparin lock flush 100 unit/mL  500 Units Intracatheter Once PRN Lequita Asal, MD      . heparin lock flush 100 unit/mL  250 Units Intracatheter Once PRN Lequita Asal, MD      . sodium chloride 0.9 % injection 10 mL  10 mL Intracatheter PRN Lequita Asal, MD      . sodium chloride 0.9 % injection 3 mL  3 mL Intravenous Once PRN Lequita Asal, MD        Allergies as of 05/15/2016 - Review Complete 05/15/2016  Allergen Reaction Noted  . Niaspan [niacin er] Other (See Comments) 03/19/2012    ROS:  General: Negative for anorexia, weight loss, fever, chills, fatigue, weakness. ENT: Negative for hoarseness, difficulty swallowing , nasal congestion. CV: Negative for chest pain, angina, palpitations, dyspnea on exertion, peripheral edema.  Respiratory: Negative for dyspnea at rest, dyspnea on exertion, cough, sputum, wheezing.  GI: See history of present illness. GU:  Negative for dysuria, hematuria, urinary incontinence, urinary frequency, nocturnal urination.  Endo: Negative for unusual weight change.    Physical Examination:   BP (!)  167/81   Pulse 73   Temp 98 F (36.7 C) (Oral)   Ht 5' 9"  (1.753 m)   Wt 174 lb (78.9 kg)   BMI 25.70 kg/m   General: Well-nourished, well-developed in no acute distress.  Eyes: No icterus. Conjunctivae pink. Abdomen: Bowel sounds are normal, nontender, nondistended, no hepatosplenomegaly or masses, no abdominal bruits or hernia , no rebound or guarding.   Extremities: No lower extremity edema. No clubbing or deformities. Neuro: Alert and oriented  x 3.  Grossly intact. Skin: Warm and dry, no jaundice.   Psych: Alert and cooperative, normal mood and affect.  Labs:    Imaging Studies: No results found.  Assessment and Plan:   Zachary Buck is a 76 y.o. y/o male who comes in today with a history of low iron and ferritin. The patient had negative stool studies for any blood in his stools. The patient also reports that he has been feeling well recently. His most recent hemoglobin is higher than it has been in the past. The patient has undergone multiple EGDs and a colonoscopy with a capsule endoscopy within the last 2 years. I'm not sure if the patient's low iron is due to malabsorption of his iron versus possible intermittent GI bleeding although his stools have been negative. The patient and myself or hesitant to repeat any procedures at this time because he is feeling well without any complaints. The patient has been told that if he does develop heme-positive stools or sees any overt bleeding then we may need to readdress his GI tract for the cause of his iron deficiency anemia. The patient and his wife have been explained the plan and agree with it.    [name], MD. [name]   Note: This dictation was prepared with Dragon dictation along with smaller phrase technology. Any transcriptional errors that result from this process are unintentional.

## 2016-05-31 DIAGNOSIS — I10 Essential (primary) hypertension: Secondary | ICD-10-CM | POA: Diagnosis not present

## 2016-05-31 DIAGNOSIS — I2510 Atherosclerotic heart disease of native coronary artery without angina pectoris: Secondary | ICD-10-CM | POA: Diagnosis not present

## 2016-05-31 DIAGNOSIS — E109 Type 1 diabetes mellitus without complications: Secondary | ICD-10-CM | POA: Diagnosis not present

## 2016-05-31 DIAGNOSIS — E782 Mixed hyperlipidemia: Secondary | ICD-10-CM | POA: Diagnosis not present

## 2016-06-01 ENCOUNTER — Other Ambulatory Visit: Payer: Self-pay | Admitting: *Deleted

## 2016-06-01 ENCOUNTER — Inpatient Hospital Stay: Payer: PPO | Attending: Hematology and Oncology

## 2016-06-01 DIAGNOSIS — E7800 Pure hypercholesterolemia, unspecified: Secondary | ICD-10-CM | POA: Diagnosis not present

## 2016-06-01 DIAGNOSIS — Z955 Presence of coronary angioplasty implant and graft: Secondary | ICD-10-CM | POA: Diagnosis not present

## 2016-06-01 DIAGNOSIS — K648 Other hemorrhoids: Secondary | ICD-10-CM | POA: Insufficient documentation

## 2016-06-01 DIAGNOSIS — I10 Essential (primary) hypertension: Secondary | ICD-10-CM | POA: Diagnosis not present

## 2016-06-01 DIAGNOSIS — I252 Old myocardial infarction: Secondary | ICD-10-CM | POA: Insufficient documentation

## 2016-06-01 DIAGNOSIS — Z79899 Other long term (current) drug therapy: Secondary | ICD-10-CM | POA: Diagnosis not present

## 2016-06-01 DIAGNOSIS — D500 Iron deficiency anemia secondary to blood loss (chronic): Secondary | ICD-10-CM

## 2016-06-01 DIAGNOSIS — D509 Iron deficiency anemia, unspecified: Secondary | ICD-10-CM | POA: Diagnosis not present

## 2016-06-01 DIAGNOSIS — I2510 Atherosclerotic heart disease of native coronary artery without angina pectoris: Secondary | ICD-10-CM | POA: Diagnosis not present

## 2016-06-01 DIAGNOSIS — E119 Type 2 diabetes mellitus without complications: Secondary | ICD-10-CM

## 2016-06-01 DIAGNOSIS — R161 Splenomegaly, not elsewhere classified: Secondary | ICD-10-CM | POA: Insufficient documentation

## 2016-06-01 DIAGNOSIS — C9000 Multiple myeloma not having achieved remission: Secondary | ICD-10-CM

## 2016-06-01 DIAGNOSIS — Z86010 Personal history of colonic polyps: Secondary | ICD-10-CM | POA: Diagnosis not present

## 2016-06-01 DIAGNOSIS — K766 Portal hypertension: Secondary | ICD-10-CM | POA: Diagnosis not present

## 2016-06-01 DIAGNOSIS — D472 Monoclonal gammopathy: Secondary | ICD-10-CM | POA: Insufficient documentation

## 2016-06-01 DIAGNOSIS — N400 Enlarged prostate without lower urinary tract symptoms: Secondary | ICD-10-CM | POA: Insufficient documentation

## 2016-06-01 DIAGNOSIS — K760 Fatty (change of) liver, not elsewhere classified: Secondary | ICD-10-CM | POA: Insufficient documentation

## 2016-06-01 DIAGNOSIS — Z77090 Contact with and (suspected) exposure to asbestos: Secondary | ICD-10-CM | POA: Insufficient documentation

## 2016-06-01 DIAGNOSIS — K7469 Other cirrhosis of liver: Secondary | ICD-10-CM | POA: Diagnosis not present

## 2016-06-01 LAB — COMPREHENSIVE METABOLIC PANEL
ALT: 26 U/L (ref 17–63)
AST: 28 U/L (ref 15–41)
Albumin: 3 g/dL — ABNORMAL LOW (ref 3.5–5.0)
Alkaline Phosphatase: 68 U/L (ref 38–126)
Anion gap: 4 — ABNORMAL LOW (ref 5–15)
BUN: 22 mg/dL — ABNORMAL HIGH (ref 6–20)
CO2: 24 mmol/L (ref 22–32)
Calcium: 9.7 mg/dL (ref 8.9–10.3)
Chloride: 107 mmol/L (ref 101–111)
Creatinine, Ser: 1.04 mg/dL (ref 0.61–1.24)
GFR calc Af Amer: >60 mL/min (ref >60)
GFR calc non Af Amer: >60 mL/min (ref >60)
Glucose, Bld: 230 mg/dL — ABNORMAL HIGH (ref 65–99)
Potassium: 5.1 mmol/L (ref 3.5–5.1)
Sodium: 135 mmol/L (ref 135–145)
Total Bilirubin: 1.1 mg/dL (ref 0.3–1.2)
Total Protein: 7.9 g/dL (ref 6.5–8.1)

## 2016-06-01 LAB — CBC WITH DIFFERENTIAL/PLATELET
Basophils Absolute: 0.1 10*3/uL (ref 0–0.1)
Basophils Relative: 1 %
Eosinophils Absolute: 0.6 10*3/uL (ref 0–0.7)
Eosinophils Relative: 9 %
HCT: 31.7 % — ABNORMAL LOW (ref 40.0–52.0)
Hemoglobin: 10.8 g/dL — ABNORMAL LOW (ref 13.0–18.0)
Lymphocytes Relative: 20 %
Lymphs Abs: 1.2 10*3/uL (ref 1.0–3.6)
MCH: 29.2 pg (ref 26.0–34.0)
MCHC: 34.2 g/dL (ref 32.0–36.0)
MCV: 85.5 fL (ref 80.0–100.0)
Monocytes Absolute: 0.6 10*3/uL (ref 0.2–1.0)
Monocytes Relative: 10 %
Neutro Abs: 3.7 10*3/uL (ref 1.4–6.5)
Neutrophils Relative %: 60 %
Platelets: 92 10*3/uL — ABNORMAL LOW (ref 150–440)
RBC: 3.7 MIL/uL — ABNORMAL LOW (ref 4.40–5.90)
RDW: 16.4 % — ABNORMAL HIGH (ref 11.5–14.5)
WBC: 6.1 10*3/uL (ref 3.8–10.6)

## 2016-06-01 LAB — FERRITIN: Ferritin: 31 ng/mL (ref 24–336)

## 2016-06-02 ENCOUNTER — Inpatient Hospital Stay (HOSPITAL_BASED_OUTPATIENT_CLINIC_OR_DEPARTMENT_OTHER): Payer: PPO | Admitting: Hematology and Oncology

## 2016-06-02 ENCOUNTER — Encounter: Payer: Self-pay | Admitting: Hematology and Oncology

## 2016-06-02 ENCOUNTER — Telehealth: Payer: Self-pay | Admitting: *Deleted

## 2016-06-02 ENCOUNTER — Other Ambulatory Visit: Payer: Self-pay | Admitting: *Deleted

## 2016-06-02 ENCOUNTER — Ambulatory Visit: Payer: PPO | Admitting: Hematology and Oncology

## 2016-06-02 VITALS — BP 155/77 | HR 79 | Temp 97.2°F | Resp 18 | Wt 174.2 lb

## 2016-06-02 DIAGNOSIS — C9000 Multiple myeloma not having achieved remission: Secondary | ICD-10-CM

## 2016-06-02 DIAGNOSIS — Z79899 Other long term (current) drug therapy: Secondary | ICD-10-CM

## 2016-06-02 DIAGNOSIS — D472 Monoclonal gammopathy: Secondary | ICD-10-CM | POA: Diagnosis not present

## 2016-06-02 DIAGNOSIS — D509 Iron deficiency anemia, unspecified: Secondary | ICD-10-CM

## 2016-06-02 DIAGNOSIS — D500 Iron deficiency anemia secondary to blood loss (chronic): Secondary | ICD-10-CM

## 2016-06-02 LAB — HEMOGLOBIN A1C
Hgb A1c MFr Bld: 8.7 % — ABNORMAL HIGH (ref 4.8–5.6)
Mean Plasma Glucose: 203 mg/dL

## 2016-06-02 LAB — PROTEIN ELECTROPHORESIS, SERUM
A/G Ratio: 0.8 (ref 0.7–1.7)
Albumin ELP: 3.2 g/dL (ref 2.9–4.4)
Alpha-1-Globulin: 0.2 g/dL (ref 0.0–0.4)
Alpha-2-Globulin: 0.5 g/dL (ref 0.4–1.0)
Beta Globulin: 0.8 g/dL (ref 0.7–1.3)
Gamma Globulin: 2.5 g/dL — ABNORMAL HIGH (ref 0.4–1.8)
Globulin, Total: 4.1 g/dL — ABNORMAL HIGH (ref 2.2–3.9)
M-Spike, %: 2.3 g/dL — ABNORMAL HIGH
Total Protein ELP: 7.3 g/dL (ref 6.0–8.5)

## 2016-06-02 NOTE — Progress Notes (Signed)
Patient states he is having a lot of stomach pain.

## 2016-06-02 NOTE — Telephone Encounter (Signed)
-----   Message from Lequita Asal, MD sent at 06/02/2016  5:10 PM EDT ----- Regarding: Please call patient  SPEP improved.  M-spike 2.3  M  ----- Message ----- From: Interface, Lab In [place] Sent: 06/01/2016   8:53 AM To: Lequita Asal, MD

## 2016-06-02 NOTE — Telephone Encounter (Signed)
Called patient and spoke to wife to inform them M-Spike has improved.

## 2016-06-02 NOTE — Progress Notes (Signed)
[facility] day:  06/02/16  Chief Complaint: Zachary Buck is a 76 y.o. male  with stage II multiple myeloma and iron deficiency anemia who is seen for 1 month assessment.  HPI: The patient was last seen in the medical oncology clinic on 05/02/2016.  At that time, he felt fine.  He denied any melena or hematochezia.  Ferritin was drifting down.  Guaiac cards x 3 were negative.  He received Venofer on 05/04/2016.  He saw Dr. Allen Norris on 05/15/2016.  He was unsure if his low iron was due to malabsorption versus intermittent bleeding.  Decision was made to continue surveillance secondary to recent negative endoscopies.  Labs on 06/01/2016 revealed a hematocrit of 31.7, hemoglobin 10.8, MCV 85.5, platelets 92,000, WBC 6100 with an ANC of 3700.  CMP revealed a creatinine 1.04, calcium 9.7, albumen 3.0 and protein 7.9.  Ferritin was 31.  SPEP is pending.  Symptomatically, he notes diarrhea all the time except for 3 days last week. He started back on Enterogam. He states that his stomach was cramping this morning. Eating causes diarrhea.  Stool has been dark.  He denies any hematochezia.  He denies any excess bruising or bleeding.  He has had no infections.   Past Medical History:  Diagnosis Date  . Anemia   . Atherosclerotic heart disease    s/p MI, s/p stent mid circumflex  . CAD (coronary artery disease)    Dr Nehemiah Massed, cardiologist  . Cirrhosis of liver ([place])   . Clavicle fracture   . Diabetes mellitus without complication ([place])    oral med  . Elevated blood sugar    02/03/16 and 02/09/16 had BS over 400.  otherwise running around 250  . Esophageal varices ([place])   . Hepatic encephalopathy ([place])   . Hypercholesterolemia   . Hypertension    controlled on meds  . Internal hemorrhoids   . Leg fracture   . Multiple myeloma ([place])   . Myocardial infarct   . Prostate enlargement   . Tubular adenoma of colon     Past Surgical History:   Procedure Laterality Date  . COLONOSCOPY WITH PROPOFOL N/A 03/04/2015   Procedure: COLONOSCOPY WITH PROPOFOL;  Surgeon: Jerene Bears, MD;  Location: [facility];  Service: Gastroenterology;  Laterality: N/A;  . COLONOSCOPY WITH PROPOFOL N/A 12/09/2015   Procedure: COLONOSCOPY WITH PROPOFOL;  Surgeon: [name], MD;  Location: [place] [facility];  Service: Endoscopy;  Laterality: N/A;  DIABETIC-ORAL MEDS  . CORONARY ANGIOPLASTY WITH STENT PLACEMENT     2 stents  . ESOPHAGOGASTRODUODENOSCOPY (EGD) WITH PROPOFOL N/A 03/04/2015   Procedure: ESOPHAGOGASTRODUODENOSCOPY (EGD) WITH PROPOFOL;  Surgeon: Jerene Bears, MD;  Location: [facility];  Service: Gastroenterology;  Laterality: N/A;  . ESOPHAGOGASTRODUODENOSCOPY (EGD) WITH PROPOFOL N/A 12/09/2015   Procedure: ESOPHAGOGASTRODUODENOSCOPY (EGD) WITH PROPOFOL;  Surgeon: [name], MD;  Location: [place];  Service: Endoscopy;  Laterality: N/A;  . ESOPHAGOGASTRODUODENOSCOPY (EGD) WITH PROPOFOL N/A 12/21/2015   Procedure: ESOPHAGOGASTRODUODENOSCOPY (EGD) WITH PROPOFOL;  Surgeon: [name], MD;  Location: [facility];  Service: Endoscopy;  Laterality: N/A;  . ESOPHAGOGASTRODUODENOSCOPY (EGD) WITH PROPOFOL N/A 02/24/2016   Procedure: ESOPHAGOGASTRODUODENOSCOPY (EGD) WITH PROPOFOL;  Surgeon: [name], MD;  Location: [place] [facility];  Service: Endoscopy;  Laterality: N/A;  diabetic - oral med  . POLYPECTOMY N/A 12/09/2015   Procedure: POLYPECTOMY;  Surgeon: [name], MD;  Location: [place];  Service: Endoscopy;  Laterality: N/A;  . TONSILLECTOMY  Family History  Problem Relation Age of Onset  . Aneurysm Father 79    of the heart  . Heart attack Father   . Diabetes Mother     Social History:  reports that he has never smoked. He has never used smokeless tobacco. He reports that he does not drink alcohol or use drugs.  Patient denies any exposure to radiation or toxins.  He is a Company secretary.  He lives in  [place].  The patient is accompanied by his wife, [name], today.  Allergies:  Allergies  Allergen Reactions  . Niaspan [Niacin Er] Other (See Comments)    Just does not want to take     Current Medications: Current Outpatient Prescriptions  Medication Sig Dispense Refill  . Alpha-Lipoic Acid (LIPOIC ACID PO) Take 200 mg by mouth daily.     . Ascorbic Acid (VITAMIN C) 1000 MG tablet Take 1,000 mg by mouth daily.    . B Complex Vitamins (VITAMIN B COMPLEX) TABS Take by mouth daily.    . blood glucose meter kit and supplies KIT Dispense based on patient and insurance preference. Use up to four times daily as directed. (FOR ICD-9 250.00, 250.01). 1 each 0  . blood glucose meter kit and supplies KIT Dispense based on patient and insurance preference. Use up to four times daily as directed. E11.9 Please dispense Freestyle freedom meter and supplies. 1 each 0  . Cholecalciferol (VITAMIN D-3) 1000 UNITS CAPS Take by mouth.    . CHROMIUM PO Take 600 mg by mouth daily.     . Cinnamon 500 MG capsule Take 500 mg by mouth.    . Coenzyme Q10 100 MG capsule Take by mouth.    . furosemide (LASIX) 20 MG tablet Take 1 tablet (20 mg total) by mouth 2 (two) times daily. 60 tablet 3  . GENERLAC 10 GM/15ML SOLN TK 15 ML PO BID. TITRATE TO 2 SOFT STOOLS PER DAY. DECREASE IF YOU START HAVING DIARRHEA  3  . Ginger, Zingiber officinalis, (GINGER ROOT) 550 MG CAPS Take by mouth daily.    Marland Kitchen glipiZIDE (GLUCOTROL) 5 MG tablet TAKE 1 TABLET BY MOUTH EVERY MORNING AND 2 TABLETS EVERY EVENING 270 tablet 2  . glucose blood (BAYER CONTOUR NEXT TEST) test strip Contour Next EZ test strips: check blood sugars twice a day. Dx: 250.00    . glucose blood (ONE TOUCH ULTRA TEST) test strip Use as instructed 100 each 12  . insulin lispro (HUMALOG) 100 UNIT/ML injection Use sliding scale as directed.  (i.e., if blood sugar 251-300 - give 2 units, etc as outlined. 10 mL 1  . Insulin Syringe-Needle U-100 (INSULIN SYRINGE  .5CC/31GX5/16") 31G X 5/16" 0.5 ML MISC Will be using a SSI - for elevated blood sugars. 100 each 0  . lactulose (CHRONULAC) 10 GM/15ML solution Take 15 mLs (10 g total) by mouth 2 (two) times daily. Titrate to 2 soft stools per day. Decrease if you start having diarrhea 946 mL 3  . Lancets (ONETOUCH ULTRASOFT) lancets Use as instructed 100 each 12  . lisinopril (PRINIVIL,ZESTRIL) 20 MG tablet TAKE ONE TABLET BY MOUTH EVERY DAY/ AM    . Magnesium 500 MG CAPS Take 500 mg by mouth daily.    . metFORMIN (GLUCOPHAGE) 1000 MG tablet TAKE 1 TABLET(1000 MG) BY MOUTH TWICE DAILY WITH A MEAL 60 tablet 5  . metFORMIN (GLUCOPHAGE) 1000 MG tablet TAKE 1 TABLET(1000 MG) BY MOUTH TWICE DAILY WITH A MEAL 60 tablet 3  . NOVOLOG 100  UNIT/ML injection USE SLIDING SCALE AS OUTLINED BY PRESCRIBER  1  . Omega-3 Fatty Acids (FISH OIL) 1000 MG CAPS Take 4,000 mg by mouth daily.     Marland Kitchen OVER THE COUNTER MEDICATION BUPLEURUM    . OVER THE COUNTER MEDICATION 250 MG CITICOLINE    . OVER THE COUNTER MEDICATION     . OVER THE COUNTER MEDICATION     . OVER THE COUNTER MEDICATION     . OVER THE COUNTER MEDICATION     . SBI/Protein Isolate (ENTERAGAM) 5 g PACK Take 1 packet by mouth daily. 30 each 3  . sildenafil (REVATIO) 20 MG tablet 3 to 5 tablets per day or as instructed    . spironolactone (ALDACTONE) 50 MG tablet Take 1 tablet (50 mg total) by mouth daily. 30 tablet 3  . Theanine 100 MG CAPS Take by mouth daily.    Marland Kitchen triamcinolone cream (KENALOG) 0.1 % Apply topically 2 (two) times daily. Please avoid face and genitalia area.  Do not use in the same spot for more than 7-10 days in a row. 80 g 0  . Turmeric POWD 4,000 mg by Does not apply route daily.    . vitamin B-12 (CYANOCOBALAMIN) 100 MCG tablet Take 200 mcg by mouth daily.     No current facility-administered medications for this visit.    Facility-Administered Medications Ordered in Other Visits  Medication Dose Route Frequency Provider Last Rate Last Dose  .  alteplase (CATHFLO ACTIVASE) injection 2 mg  2 mg Intracatheter Once PRN Lequita Asal, MD      . heparin lock flush 100 unit/mL  500 Units Intracatheter Once PRN Lequita Asal, MD      . heparin lock flush 100 unit/mL  250 Units Intracatheter Once PRN Lequita Asal, MD      . sodium chloride 0.9 % injection 10 mL  10 mL Intracatheter PRN Lequita Asal, MD      . sodium chloride 0.9 % injection 3 mL  3 mL Intravenous Once PRN Lequita Asal, MD        Review of Systems:  GENERAL:  Feels "ok".  No fevers or sweats.  Weight up 1 pound. PERFORMANCE STATUS (ECOG):  1 HEENT:  No visual changes, runny nose, sore throat, mouth sores or tenderness. Lungs: No shortness of breath or cough.  No hemoptysis. Cardiac:  No chest pain, palpitations, orthopnea, or PND. GI:  Diarrhea (see HPI) with abdominal cramping.  Stool dark.  No nausea, vomiting, constipation, or hematochezia.  GU:  No urgency, frequency, dysuria, or hematuria. Musculoskeletal:  No back pain.  No joint pain.  No muscle tenderness. Extremities:  No pain or swelling. Skin:  No rashes or skin changes. Neuro:  Short term memory issues.  No headache, numbness or weakness, balance or coordination issues. Endocrine:  Diabetes.  No thyroid issues, hot flashes or night sweats. Psych:  No mood changes, depression or anxiety. Pain:  Abdominal cramping. Review of systems:  All other systems reviewed and found to be negative.  Physical Exam: BP (!) 151/69 (BP Location: Left Arm, Patient Position: Sitting)   Pulse 76   Temp 97.2 F (36.2 C) (Tympanic)   Resp 18   Wt 174 lb 3 oz (79 kg)   BMI 25.72 kg/m   GENERAL:  Well developed, well nourished, gentleman sitting comfortably in the exam room in no acute distress. MENTAL STATUS:  Alert and oriented to person, place and time. HEAD:  Pearline Cables hair.  Normocephalic, atraumatic, face symmetric, no Cushingoid features. EYES:  Glasses.  Blue eyes.  Pupils equal round and  reactive to light and accomodation.  No conjunctivitis or scleral icterus. ENT:  Oropharynx clear without lesion.  Tongue normal. Mucous membranes moist.  RESPIRATORY:  Clear to auscultation without rales, wheezes or rhonchi. CARDIOVASCULAR:  Regular rate and rhythm without murmur, rub or gallop. ABDOMEN:  Soft, non tender with active bowel sounds, and no appreciable hepatosplenomegaly.  No masses.  No guarding or rebound tenderness. SKIN:  No rashes, ulcers or lesions. EXTREMITIES: No edema, no skin discoloration or tenderness.  No palpable cords. LYMPH NODES: No palpable cervical, supraclavicular, axillary or inguinal adenopathy  NEUROLOGICAL: Unremarkable. PSYCH:  Appropriate.    Appointment on 06/01/2016  Component Date Value Ref Range Status  . WBC 06/01/2016 6.1  3.8 - 10.6 K/uL Final  . RBC 06/01/2016 3.70* 4.40 - 5.90 MIL/uL Final  . Hemoglobin 06/01/2016 10.8* 13.0 - 18.0 g/dL Final  . HCT 06/01/2016 31.7* 40.0 - 52.0 % Final  . MCV 06/01/2016 85.5  80.0 - 100.0 fL Final  . MCH 06/01/2016 29.2  26.0 - 34.0 pg Final  . MCHC 06/01/2016 34.2  32.0 - 36.0 g/dL Final  . RDW 06/01/2016 16.4* 11.5 - 14.5 % Final  . Platelets 06/01/2016 92* 150 - 440 K/uL Final  . Neutrophils Relative % 06/01/2016 60  % Final  . Neutro Abs 06/01/2016 3.7  1.4 - 6.5 K/uL Final  . Lymphocytes Relative 06/01/2016 20  % Final  . Lymphs Abs 06/01/2016 1.2  1.0 - 3.6 K/uL Final  . Monocytes Relative 06/01/2016 10  % Final  . Monocytes Absolute 06/01/2016 0.6  0.2 - 1.0 K/uL Final  . Eosinophils Relative 06/01/2016 9  % Final  . Eosinophils Absolute 06/01/2016 0.6  0 - 0.7 K/uL Final  . Basophils Relative 06/01/2016 1  % Final  . Basophils Absolute 06/01/2016 0.1  0 - 0.1 K/uL Final  . Sodium 06/01/2016 135  135 - 145 mmol/L Final  . Potassium 06/01/2016 5.1  3.5 - 5.1 mmol/L Final  . Chloride 06/01/2016 107  101 - 111 mmol/L Final  . CO2 06/01/2016 24  22 - 32 mmol/L Final  . Glucose, Bld 06/01/2016  230* 65 - 99 mg/dL Final  . BUN 06/01/2016 22* 6 - 20 mg/dL Final  . Creatinine, Ser 06/01/2016 1.04  0.61 - 1.24 mg/dL Final  . Calcium 06/01/2016 9.7  8.9 - 10.3 mg/dL Final  . Total Protein 06/01/2016 7.9  6.5 - 8.1 g/dL Final  . Albumin 06/01/2016 3.0* 3.5 - 5.0 g/dL Final  . AST 06/01/2016 28  15 - 41 U/L Final  . ALT 06/01/2016 26  17 - 63 U/L Final  . Alkaline Phosphatase 06/01/2016 68  38 - 126 U/L Final  . Total Bilirubin 06/01/2016 1.1  0.3 - 1.2 mg/dL Final  . GFR calc non Af Amer 06/01/2016 >60  >60 mL/min Final  . GFR calc Af Amer 06/01/2016 >60  >60 mL/min Final   Comment: (NOTE) The eGFR has been calculated using the CKD EPI equation. This calculation has not been validated in all clinical situations. eGFR's persistently <60 mL/min signify possible Chronic Kidney Disease.   . Anion gap 06/01/2016 4* 5 - 15 Final  . Ferritin 06/01/2016 31  24 - 336 ng/mL Final  . Hgb A1c MFr Bld 06/01/2016 8.7* 4.8 - 5.6 % Final   Comment: (NOTE)         Pre-diabetes: 5.7 - 6.4  Diabetes: >6.4         Glycemic control for adults with diabetes: <7.0   . Mean Plasma Glucose 06/01/2016 203  mg/dL Final   Comment: (NOTE) Performed At: [facility] [place], [place] [id number] Lindon Romp MD OM:[id number]     Assessment:  BRYN PERKIN is a 76 y.o. male with iron deficiency anemia and stage II multiple myeloma. 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 Serum immunoglobulins noted an IgG of 2930 (high). 24-hour urine revealed a 8.8% monoclonal protein (10.6 mg/24 hours).  Albumen was 3.3 on 08/26/2014. He had pneumonia in 05/2014.   Bone survey on 10/20/2014 revealed no lytic lesions. Bone survey on 09/16/2015 revealed no focal lytic lesion or acute bony abnormality.   SPEP has been followed:  2.3 gm/dL on 10/09/2014, 2.3 gm/dL on 01/18/2015, 2.3  gm/dL on 03/22/2015, 2.3 gm/dL on 06/18/2015, 2.3 gm/dL on 09/13/2015, 2.3 gm/dL on 11/08/2015, 2.7 gm/dL on 01/11/2016, 2.8 gm/dL on 02/17/2016, 2.4 gm/dL on 03/28/2016, 2.8 gm/dL on 04/24/2016, and 2.3 gm/dL on 06/01/2016.   Lambda free light chainswere155.3 (ratio 0.15) on 01/18/2015, 174.59 (ratio of 0.16)on 06/18/2015, 182.9 (ratio of 0.13)on 09/13/2015, 205 (ratio 0.11) on 11/08/2015, 292.7 (ratio 0.09) on 01/11/2016, and 167.0 (ratio 0.13) on 03/27/2016.  Bone marrow on 11/10/2014 revealed a 10% monoclonal plasma cell infiltrate. Marrow was hypercellular for age (40-50%) with mixed maturing hematopoiesis, relative erythroid hyperplasia and mild nonspecific dyserythropoiesis. There was patchy mild focally moderate increase in reticulin. There were no significant iron stores. Myeloma FISH panel revealed t(11;14) which results in fusion of CCND1 (BCL1) at 11q13 with the immunoglobulin heavy chain gene (IgH) at 14q32 (a favorable prognostic feature).   Bone marrow on 03/14/2016 revealed persistent monoclonal plasma cell infiltrate of 10-15%.  Marrow was hypercellular for age (40-50%) with trilineage hematopoiesis, increase eosinophils, adequate megakaryocytes and no increase in blasts. There was mild patchy increase in reticulin fibers. There was stainable iron consistent with recent parenteral iron.  Cytogenetics were normal (46, XY). FISH studies revealed CCND1/IGH translocation t(11;14).  Beta2 microglobulin was 4.2 on 11/24/2014, 3.8 on 06/18/2015, 4.5 on 09/13/2015, 4.5 on 10/18/2015, and 4.6 on 04/24/2016.  PET scan on 12/07/2014 revealed no hypermetabolic foci within the marrow space or nodal stations to suggest active multiple myeloma. There was hypermetabolism within the right lower lobe, corresponding to a similar dependent density. Morphology favored scarring.There was cirrhosis and portal hypertension. There was gastric body hypermetabolism favoring physiologic. Spleen was 15.3 cm  (volume 1065 cc).  There was pulmonary artery enlargement suggesting pulmonary artery hypertension. 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  He has a history of asbestos exposure in the [employer].  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  Etiology is felt secondary to fatty liver.  Abdominal CT scan on 03/16/2015 revealed splenomegaly (14.5 cm), cirrhosis, and stigmata of portal hypertension with esophageal and gastric varices.  There were no liver lesions.  He is on aldactone.  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  There were no pleural effusions.  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  There was no frank honeycombing. This finding accounted for the medial right lower lobe hypermetabolism on the 12/07/2014 PET-CT.  There was cirrhosis, splenomegaly, and prominent gastroesophageal varices.  He has had mild anemia since 10/2013 and mild thrombocytopenia since 05/2014.   Colonoscopy in 10/2013 revealed polyps.  Colonoscopy on 03/04/2015 revealed a sessile polyp was seen in the transverse colon (tubular adenoma).  Colonoscopy on 12/09/2015 revealed one 4 mm polyp in the ascending colon and two 4-5 mm polyps in the sigmoid colon (hyperplastic polyps).    EGD in 10/2013 revealed gastritis. EGD on 03/04/2015 revealed medium-sized esophageal varices in the mid and distal esophagus without bleeding. There was a sessile polyp in the gastric antrum (hyperplastic polyp).  There were 3 small angiodysplastic lesions in the second part of the duodenum.  EGD on 12/09/2015 revealed grade II esophageal varices.  There was a single gastric polyp.  There was a single bleeding angioectasia in the duodenum treated with bipolar cautery  and clips.  EGD on 02/24/2016 revealed no gross lesions in the esophagus. There was a single gastric polyp and a duodenal polyp. Resection was attempted. No specimens were collected  Capsule enteroscopy on 11/27/2014 revealed 1-2 gastric polyps and one colon polyp.  There were no findings to explain iron deficiency anemia.  His diet was good. Labs confirmed iron deficiency (ferritin 11.8 on 05/2014 and 13 on 10/09/2014). B12 was low normal (MMA normal) thus ruling out B12 deficiency.  He has chronic diarrhea (2 years).  Diarrhea improved on Enterogam (began 06/22/2015).  He has intermittent diarrhea.    He has recurrent iron deficiency anemia.  He has received Venofer weekly 3 (03/26/2015 - 04/14/2015), weekly x 3 (06/25/2015 - 07/09/2015), weekly x 3 (09/17/2015 - 10/01/2015), weekly x 3 (11/16/2015 - 11/30/2015), weekly x 2 (01/18/2016 - 01/25/2016),  weekly x 3 (02/18/2016 - 03/03/2016), and 200 mg (05/04/2016).    Ferritin has been followed:  23 on 06/18/2015, 121 on 07/26/2015, 17 on 09/13/2015, 18 on 11/15/2015, 52 on 12/13/2015, 10 on 01/11/2016, 88 on 02/03/2016, 23 on 02/17/2016, 46 on 03/27/2016, 20 on 04/24/2016, and 31 on 06/01/2016.  Symptomatically, he has abdominal cramping, diarrhea, and dark stools.  Hemoglobin is 10.8 and platelets 92,000.  Plan: 1.  Review labs from 06/01/2016.  Discuss elevated blood sugar.  Discuss stable creatinine.  Hemoglobin relatively stable (10.4-12.0) in past 4 months.  Platelet count hovering around 90,000-100,000. Ferritin > 30.  Await M-spike.  Discuss continuing observation.  Discuss low threshold to treat his myeloma if he has myeloma defining events unrelated to his other medical problems. 2.  Discuss ongoing GI symptoms.  Check guaiac cards.  Follow-up with Dr. Allen Norris. 3.  Guaiac cards x 3 4.  RTC in 1 month for MD assessment and labs (CBC with diff, CMP, SPEP, ferritin-day before) +/- Venofer.  Addendum:  M-spike 2.3 gm/dl (stable).   Continue  observation.   Lequita Asal, MD  06/02/2016, 9:23 AM

## 2016-06-03 DIAGNOSIS — C9000 Multiple myeloma not having achieved remission: Secondary | ICD-10-CM | POA: Diagnosis not present

## 2016-06-04 DIAGNOSIS — C9000 Multiple myeloma not having achieved remission: Secondary | ICD-10-CM | POA: Diagnosis not present

## 2016-06-06 ENCOUNTER — Encounter: Payer: Self-pay | Admitting: Internal Medicine

## 2016-06-06 ENCOUNTER — Other Ambulatory Visit: Payer: Self-pay | Admitting: *Deleted

## 2016-06-06 DIAGNOSIS — D500 Iron deficiency anemia secondary to blood loss (chronic): Secondary | ICD-10-CM

## 2016-06-06 DIAGNOSIS — C9000 Multiple myeloma not having achieved remission: Secondary | ICD-10-CM

## 2016-06-06 LAB — OCCULT BLOOD X 1 CARD TO LAB, STOOL
Fecal Occult Bld: NEGATIVE
Fecal Occult Bld: NEGATIVE
Fecal Occult Bld: NEGATIVE

## 2016-06-07 ENCOUNTER — Encounter: Payer: Self-pay | Admitting: Gastroenterology

## 2016-06-08 NOTE — Telephone Encounter (Signed)
See lab result note.  With persistent increased sugars and limits of medication he can take, I had wanted to refer him to endocrinology for evaluation and treatment.  Would be seeing a different endocrinologist.  If declines, continue to monitor sugars and check and record and send in over the next few weeks.

## 2016-06-14 NOTE — Telephone Encounter (Signed)
Pt declined AWV. °

## 2016-06-20 ENCOUNTER — Other Ambulatory Visit: Payer: Self-pay

## 2016-06-20 ENCOUNTER — Ambulatory Visit (INDEPENDENT_AMBULATORY_CARE_PROVIDER_SITE_OTHER): Payer: PPO | Admitting: Gastroenterology

## 2016-06-20 ENCOUNTER — Encounter: Payer: Self-pay | Admitting: Gastroenterology

## 2016-06-20 VITALS — BP 166/80 | HR 91 | Temp 98.0°F | Resp 16 | Ht 69.0 in | Wt 173.2 lb

## 2016-06-20 DIAGNOSIS — K580 Irritable bowel syndrome with diarrhea: Secondary | ICD-10-CM | POA: Diagnosis not present

## 2016-06-20 DIAGNOSIS — R197 Diarrhea, unspecified: Secondary | ICD-10-CM | POA: Diagnosis not present

## 2016-06-20 MED ORDER — DICYCLOMINE HCL 10 MG PO CAPS: 10.0000 mg | ORAL_CAPSULE | Freq: Three times a day (TID) | ORAL | 3 refills | Status: DC

## 2016-06-20 NOTE — Progress Notes (Signed)
Primary Care Physician: Einar Pheasant, MD  Primary Gastroenterologist:  Dr. [name]  No chief complaint on file.   HPI: Zachary Buck is a 76 y.o. male here who has a history of cirrhosis and ascites that has been controlled with Aldactone and the patient is also on lactulose. 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 He should has also been seen in the past for iron deficiency anemia. The patient had an upper endoscopy which showed no signs of esophageal varices. The patient reports that he will have diarrhea for a few days then he'll be constipated. The patient has cut down on his lactulose. The patient's wife states that she thinks his diarrhea is worse when he has to give his sermons or when he is nervous. The patient has very little if any milk intake. The patient also reports that he has been having crampy abdominal pain without any fevers chills nausea or vomiting. The patient's abdominal distention has been under control. The patient's Hemoccult cards have been negative multiple times despite his ferritin being low and his iron being low.  Current Outpatient Prescriptions  Medication Sig Dispense Refill  . Alpha-Lipoic Acid (LIPOIC ACID PO) Take 200 mg by mouth daily.     . Ascorbic Acid (VITAMIN C) 1000 MG tablet Take 1,000 mg by mouth daily.    . B Complex Vitamins (VITAMIN B COMPLEX) TABS Take by mouth daily.    . blood glucose meter kit and supplies KIT Dispense based on patient and insurance preference. Use up to four times daily as directed. (FOR ICD-9 250.00, 250.01). 1 each 0  . blood glucose meter kit and supplies KIT Dispense based on patient and insurance preference. Use up to four times daily as directed. E11.9 Please dispense Freestyle freedom meter and supplies. 1 each 0  . Cholecalciferol (VITAMIN D-3) 1000 UNITS CAPS Take by  mouth.    . CHROMIUM PO Take 600 mg by mouth daily.     . Cinnamon 500 MG capsule Take 500 mg by mouth.    . Coenzyme Q10 100 MG capsule Take by mouth.    . furosemide (LASIX) 20 MG tablet Take 1 tablet (20 mg total) by mouth 2 (two) times daily. 60 tablet 3  . GENERLAC 10 GM/15ML SOLN TK 15 ML PO BID. TITRATE TO 2 SOFT STOOLS PER DAY. DECREASE IF YOU START HAVING DIARRHEA  3  . Ginger, Zingiber officinalis, (GINGER ROOT) 550 MG CAPS Take by mouth daily.    Marland Kitchen glipiZIDE (GLUCOTROL) 5 MG tablet TAKE 1 TABLET BY MOUTH EVERY MORNING AND 2 TABLETS EVERY EVENING 270 tablet 2  . glucose blood (BAYER CONTOUR NEXT TEST) test strip Contour Next EZ test strips: check blood sugars twice a day. Dx: 250.00    . glucose blood (ONE TOUCH ULTRA TEST) test strip Use as instructed 100 each 12  . insulin lispro (HUMALOG) 100 UNIT/ML injection Use sliding scale as directed.  (i.e., if blood sugar 251-300 - give 2 units, etc as outlined. 10 mL 1  . Insulin Syringe-Needle U-100 (INSULIN SYRINGE .5CC/31GX5/16") 31G X 5/16" 0.5 ML MISC Will be using a SSI - for elevated blood sugars. 100 each 0  . lactulose (CHRONULAC) 10 GM/15ML solution Take 15 mLs (10 g total) by mouth 2 (two) times daily. Titrate to 2 soft stools per day. Decrease if  you start having diarrhea 946 mL 3  . Lancets (ONETOUCH ULTRASOFT) lancets Use as instructed 100 each 12  . lisinopril (PRINIVIL,ZESTRIL) 20 MG tablet TAKE ONE TABLET BY MOUTH EVERY DAY/ AM    . Magnesium 500 MG CAPS Take 500 mg by mouth daily.    . metFORMIN (GLUCOPHAGE) 1000 MG tablet TAKE 1 TABLET(1000 MG) BY MOUTH TWICE DAILY WITH A MEAL 60 tablet 5  . metFORMIN (GLUCOPHAGE) 1000 MG tablet TAKE 1 TABLET(1000 MG) BY MOUTH TWICE DAILY WITH A MEAL 60 tablet 3  . NOVOLOG 100 UNIT/ML injection USE SLIDING SCALE AS OUTLINED BY PRESCRIBER  1  . Omega-3 Fatty Acids (FISH OIL) 1000 MG CAPS Take 4,000 mg by mouth daily.     Marland Kitchen OVER THE COUNTER MEDICATION BUPLEURUM    . OVER THE COUNTER  MEDICATION 250 MG CITICOLINE    . OVER THE COUNTER MEDICATION     . OVER THE COUNTER MEDICATION     . OVER THE COUNTER MEDICATION     . OVER THE COUNTER MEDICATION     . SBI/Protein Isolate (ENTERAGAM) 5 g PACK Take 1 packet by mouth daily. 30 each 3  . sildenafil (REVATIO) 20 MG tablet 3 to 5 tablets per day or as instructed    . spironolactone (ALDACTONE) 50 MG tablet Take 1 tablet (50 mg total) by mouth daily. 30 tablet 3  . Theanine 100 MG CAPS Take by mouth daily.    Marland Kitchen triamcinolone cream (KENALOG) 0.1 % Apply topically 2 (two) times daily. Please avoid face and genitalia area.  Do not use in the same spot for more than 7-10 days in a row. 80 g 0  . Turmeric POWD 4,000 mg by Does not apply route daily.    . vitamin B-12 (CYANOCOBALAMIN) 100 MCG tablet Take 200 mcg by mouth daily.     No current facility-administered medications for this visit.    Facility-Administered Medications Ordered in Other Visits  Medication Dose Route Frequency Provider Last Rate Last Dose  . alteplase (CATHFLO ACTIVASE) injection 2 mg  2 mg Intracatheter Once PRN Lequita Asal, MD      . heparin lock flush 100 unit/mL  500 Units Intracatheter Once PRN Lequita Asal, MD      . heparin lock flush 100 unit/mL  250 Units Intracatheter Once PRN Lequita Asal, MD      . sodium chloride 0.9 % injection 10 mL  10 mL Intracatheter PRN Lequita Asal, MD      . sodium chloride 0.9 % injection 3 mL  3 mL Intravenous Once PRN Lequita Asal, MD        Allergies as of 06/20/2016 - Review Complete 06/02/2016  Allergen Reaction Noted  . Niaspan [niacin er] Other (See Comments) 03/19/2012    ROS:  General: Negative for anorexia, weight loss, fever, chills, fatigue, weakness. ENT: Negative for hoarseness, difficulty swallowing , nasal congestion. CV: Negative for chest pain, angina, palpitations, dyspnea on exertion, peripheral edema.  Respiratory: Negative for dyspnea at rest, dyspnea on  exertion, cough, sputum, wheezing.  GI: See history of present illness. GU:  Negative for dysuria, hematuria, urinary incontinence, urinary frequency, nocturnal urination.  Endo: Negative for unusual weight change.    Physical Examination:   There were no vitals taken for this visit.  General: Well-nourished, well-developed in no acute distress.  Eyes: No icterus. Conjunctivae pink. Mouth: Oropharyngeal mucosa moist and pink , no lesions erythema or exudate. Lungs: Clear to auscultation bilaterally.  Non-labored. Heart: Regular rate and rhythm, no murmurs rubs or gallops.  Abdomen: Bowel sounds are normal, nontender, nondistended, no hepatosplenomegaly or masses, no abdominal bruits or hernia , no rebound or guarding.   Extremities: No lower extremity edema. No clubbing or deformities. Neuro: Alert and oriented x 3.  Grossly intact. Skin: Warm and dry, no jaundice.   Psych: Alert and cooperative, normal mood and affect.  Labs:    Imaging Studies: No results found.  Assessment and Plan:   Zachary Buck is a 76 y.o. 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 The patient reports that his ascites is under control but he has bouts of diarrhea unrelated to his lactulose. The patient has symptoms worse when he is under stress and only happens intermittently. 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 The patient has been told to take fiber twice a day and he will be started on a low-dose of dicyclomine.    [name], MD. [name]   Note: This dictation was prepared with Dragon dictation along with smaller phrase technology. Any transcriptional errors that result from this process are unintentional.

## 2016-07-03 ENCOUNTER — Other Ambulatory Visit: Payer: Self-pay | Admitting: Internal Medicine

## 2016-07-03 ENCOUNTER — Inpatient Hospital Stay: Payer: PPO | Attending: Hematology and Oncology

## 2016-07-03 DIAGNOSIS — Z77090 Contact with and (suspected) exposure to asbestos: Secondary | ICD-10-CM | POA: Insufficient documentation

## 2016-07-03 DIAGNOSIS — D122 Benign neoplasm of ascending colon: Secondary | ICD-10-CM | POA: Insufficient documentation

## 2016-07-03 DIAGNOSIS — Z955 Presence of coronary angioplasty implant and graft: Secondary | ICD-10-CM | POA: Diagnosis not present

## 2016-07-03 DIAGNOSIS — K760 Fatty (change of) liver, not elsewhere classified: Secondary | ICD-10-CM | POA: Insufficient documentation

## 2016-07-03 DIAGNOSIS — I2510 Atherosclerotic heart disease of native coronary artery without angina pectoris: Secondary | ICD-10-CM | POA: Diagnosis not present

## 2016-07-03 DIAGNOSIS — K7290 Hepatic failure, unspecified without coma: Secondary | ICD-10-CM | POA: Diagnosis not present

## 2016-07-03 DIAGNOSIS — I10 Essential (primary) hypertension: Secondary | ICD-10-CM | POA: Insufficient documentation

## 2016-07-03 DIAGNOSIS — K648 Other hemorrhoids: Secondary | ICD-10-CM | POA: Insufficient documentation

## 2016-07-03 DIAGNOSIS — I864 Gastric varices: Secondary | ICD-10-CM | POA: Insufficient documentation

## 2016-07-03 DIAGNOSIS — E7800 Pure hypercholesterolemia, unspecified: Secondary | ICD-10-CM | POA: Insufficient documentation

## 2016-07-03 DIAGNOSIS — D125 Benign neoplasm of sigmoid colon: Secondary | ICD-10-CM | POA: Diagnosis not present

## 2016-07-03 DIAGNOSIS — I252 Old myocardial infarction: Secondary | ICD-10-CM | POA: Insufficient documentation

## 2016-07-03 DIAGNOSIS — D509 Iron deficiency anemia, unspecified: Secondary | ICD-10-CM | POA: Diagnosis not present

## 2016-07-03 DIAGNOSIS — C9000 Multiple myeloma not having achieved remission: Secondary | ICD-10-CM | POA: Diagnosis not present

## 2016-07-03 DIAGNOSIS — K7469 Other cirrhosis of liver: Secondary | ICD-10-CM | POA: Diagnosis not present

## 2016-07-03 DIAGNOSIS — N400 Enlarged prostate without lower urinary tract symptoms: Secondary | ICD-10-CM | POA: Diagnosis not present

## 2016-07-03 DIAGNOSIS — Z79899 Other long term (current) drug therapy: Secondary | ICD-10-CM | POA: Diagnosis not present

## 2016-07-03 DIAGNOSIS — J61 Pneumoconiosis due to asbestos and other mineral fibers: Secondary | ICD-10-CM | POA: Insufficient documentation

## 2016-07-03 DIAGNOSIS — K317 Polyp of stomach and duodenum: Secondary | ICD-10-CM | POA: Insufficient documentation

## 2016-07-03 DIAGNOSIS — I8510 Secondary esophageal varices without bleeding: Secondary | ICD-10-CM | POA: Diagnosis not present

## 2016-07-03 DIAGNOSIS — J470 Bronchiectasis with acute lower respiratory infection: Secondary | ICD-10-CM | POA: Insufficient documentation

## 2016-07-03 DIAGNOSIS — D500 Iron deficiency anemia secondary to blood loss (chronic): Secondary | ICD-10-CM

## 2016-07-03 DIAGNOSIS — J849 Interstitial pulmonary disease, unspecified: Secondary | ICD-10-CM | POA: Insufficient documentation

## 2016-07-03 DIAGNOSIS — K766 Portal hypertension: Secondary | ICD-10-CM | POA: Insufficient documentation

## 2016-07-03 LAB — CBC WITH DIFFERENTIAL/PLATELET
Basophils Absolute: 0 10*3/uL (ref 0–0.1)
Basophils Relative: 1 %
Eosinophils Absolute: 0.3 10*3/uL (ref 0–0.7)
Eosinophils Relative: 6 %
HCT: 32.2 % — ABNORMAL LOW (ref 40.0–52.0)
Hemoglobin: 11.2 g/dL — ABNORMAL LOW (ref 13.0–18.0)
Lymphocytes Relative: 21 %
Lymphs Abs: 1.1 10*3/uL (ref 1.0–3.6)
MCH: 29.3 pg (ref 26.0–34.0)
MCHC: 34.8 g/dL (ref 32.0–36.0)
MCV: 84.2 fL (ref 80.0–100.0)
Monocytes Absolute: 0.6 10*3/uL (ref 0.2–1.0)
Monocytes Relative: 12 %
Neutro Abs: 3.1 10*3/uL (ref 1.4–6.5)
Neutrophils Relative %: 60 %
Platelets: 99 10*3/uL — ABNORMAL LOW (ref 150–440)
RBC: 3.82 MIL/uL — ABNORMAL LOW (ref 4.40–5.90)
RDW: 15.8 % — ABNORMAL HIGH (ref 11.5–14.5)
WBC: 5.2 10*3/uL (ref 3.8–10.6)

## 2016-07-03 LAB — COMPREHENSIVE METABOLIC PANEL
ALT: 25 U/L (ref 17–63)
AST: 28 U/L (ref 15–41)
Albumin: 3.2 g/dL — ABNORMAL LOW (ref 3.5–5.0)
Alkaline Phosphatase: 71 U/L (ref 38–126)
Anion gap: 6 (ref 5–15)
BUN: 32 mg/dL — ABNORMAL HIGH (ref 6–20)
CO2: 20 mmol/L — ABNORMAL LOW (ref 22–32)
Calcium: 9.8 mg/dL (ref 8.9–10.3)
Chloride: 107 mmol/L (ref 101–111)
Creatinine, Ser: 1.22 mg/dL (ref 0.61–1.24)
GFR calc Af Amer: >60 mL/min (ref >60)
GFR calc non Af Amer: 56 mL/min — ABNORMAL LOW (ref >60)
Glucose, Bld: 243 mg/dL — ABNORMAL HIGH (ref 65–99)
Potassium: 4.8 mmol/L (ref 3.5–5.1)
Sodium: 133 mmol/L — ABNORMAL LOW (ref 135–145)
Total Bilirubin: 1.1 mg/dL (ref 0.3–1.2)
Total Protein: 8.1 g/dL (ref 6.5–8.1)

## 2016-07-03 LAB — FERRITIN: Ferritin: 16 ng/mL — ABNORMAL LOW (ref 24–336)

## 2016-07-04 ENCOUNTER — Telehealth: Payer: Self-pay | Admitting: *Deleted

## 2016-07-04 ENCOUNTER — Inpatient Hospital Stay: Payer: PPO

## 2016-07-04 ENCOUNTER — Other Ambulatory Visit: Payer: Self-pay | Admitting: *Deleted

## 2016-07-04 ENCOUNTER — Inpatient Hospital Stay (HOSPITAL_BASED_OUTPATIENT_CLINIC_OR_DEPARTMENT_OTHER): Payer: PPO | Admitting: Hematology and Oncology

## 2016-07-04 ENCOUNTER — Encounter: Payer: Self-pay | Admitting: Hematology and Oncology

## 2016-07-04 VITALS — BP 157/80 | HR 80

## 2016-07-04 VITALS — BP 157/85 | HR 83 | Temp 97.2°F | Wt 170.6 lb

## 2016-07-04 DIAGNOSIS — D509 Iron deficiency anemia, unspecified: Secondary | ICD-10-CM | POA: Diagnosis not present

## 2016-07-04 DIAGNOSIS — D508 Other iron deficiency anemias: Secondary | ICD-10-CM

## 2016-07-04 DIAGNOSIS — C9000 Multiple myeloma not having achieved remission: Secondary | ICD-10-CM | POA: Diagnosis not present

## 2016-07-04 DIAGNOSIS — D500 Iron deficiency anemia secondary to blood loss (chronic): Secondary | ICD-10-CM

## 2016-07-04 DIAGNOSIS — Z79899 Other long term (current) drug therapy: Secondary | ICD-10-CM | POA: Diagnosis not present

## 2016-07-04 LAB — PROTEIN ELECTROPHORESIS, SERUM
A/G Ratio: 0.7 (ref 0.7–1.7)
Albumin ELP: 3.1 g/dL (ref 2.9–4.4)
Alpha-1-Globulin: 0.2 g/dL (ref 0.0–0.4)
Alpha-2-Globulin: 0.6 g/dL (ref 0.4–1.0)
Beta Globulin: 0.8 g/dL (ref 0.7–1.3)
Gamma Globulin: 2.8 g/dL — ABNORMAL HIGH (ref 0.4–1.8)
Globulin, Total: 4.4 g/dL — ABNORMAL HIGH (ref 2.2–3.9)
M-Spike, %: 2.5 g/dL — ABNORMAL HIGH
Total Protein ELP: 7.5 g/dL (ref 6.0–8.5)

## 2016-07-04 MED ORDER — SODIUM CHLORIDE 0.9 % IV SOLN
Freq: Once | INTRAVENOUS | Status: AC
  Administered 2016-07-04: 15:00:00 via INTRAVENOUS
  Filled 2016-07-04: qty 1000

## 2016-07-04 MED ORDER — IRON SUCROSE 20 MG/ML IV SOLN
200.0000 mg | Freq: Once | INTRAVENOUS | Status: AC
  Administered 2016-07-04: 200 mg via INTRAVENOUS
  Filled 2016-07-04: qty 10

## 2016-07-04 MED ORDER — SODIUM CHLORIDE 0.9 % IV SOLN: 200.0000 mg | Freq: Once | INTRAVENOUS | Status: DC

## 2016-07-04 NOTE — Telephone Encounter (Signed)
-----   Message from Lequita Asal, MD sent at 07/03/2016  1:23 PM EDT ----- Regarding: Please call patiet  Iron stores low again.  Recommend IV iron.  Venofer x 3  M   ----- Message ----- From: Interface, Lab In Experiment Sent: 07/03/2016   9:50 AM To: Lequita Asal, MD

## 2016-07-04 NOTE — Telephone Encounter (Signed)
Called and spoke to Mrs. Rogus.  Informed her that patient's iron stores are low.  MD recommends IV venofer X 3.  Patient has appt today and can discuss further at that time.

## 2016-07-04 NOTE — Progress Notes (Signed)
[facility] day:  07/04/16  Chief Complaint: Zachary Buck is a 76 y.o. male  with stage II multiple myeloma and iron deficiency anemia who is seen for 1 month assessment.  HPI: The patient was last seen in the medical oncology clinic on 06/02/2016.  At that time, he noted abdominal cramping and diarrhea.  Stool had become dark.  Hemoglobin was 10.8 and platelets 92,000.  M-spike was stable at 2.3 gm/dL.  Observation for his myeloma continued.  Repeat guaiac cards were negative.  Symptomatically, he is been fatigued for the past 2 days.  He denies any pain. His wife notes that Dr. Allen Norris gave him a blue pill for his stomach pain.  He had an episode of encephalopathy again for which lactulose was helped. He continues to have trouble concentrating.   Past Medical History:  Diagnosis Date  . Anemia   . Atherosclerotic heart disease    s/p MI, s/p stent mid circumflex  . CAD (coronary artery disease)    Dr Nehemiah Massed, cardiologist  . Cirrhosis of liver ([place])   . Clavicle fracture   . Diabetes mellitus without complication ([place])    oral med  . Elevated blood sugar    02/03/16 and 02/09/16 had BS over 400.  otherwise running around 250  . Esophageal varices ([place])   . Hepatic encephalopathy ([place])   . Hypercholesterolemia   . Hypertension    controlled on meds  . Internal hemorrhoids   . Leg fracture   . Multiple myeloma ([place])   . Myocardial infarct ([place])   . Prostate enlargement   . Tubular adenoma of colon     Past Surgical History:  Procedure Laterality Date  . COLONOSCOPY WITH PROPOFOL N/A 03/04/2015   Procedure: COLONOSCOPY WITH PROPOFOL;  Surgeon: Jerene Bears, MD;  Location: [facility];  Service: Gastroenterology;  Laterality: N/A;  . COLONOSCOPY WITH PROPOFOL N/A 12/09/2015   Procedure: COLONOSCOPY WITH PROPOFOL;  Surgeon: [name], MD;  Location: [facility];  Service: Endoscopy;  Laterality: N/A;  DIABETIC-ORAL  MEDS  . CORONARY ANGIOPLASTY WITH STENT PLACEMENT     2 stents  . ESOPHAGOGASTRODUODENOSCOPY (EGD) WITH PROPOFOL N/A 03/04/2015   Procedure: ESOPHAGOGASTRODUODENOSCOPY (EGD) WITH PROPOFOL;  Surgeon: Jerene Bears, MD;  Location: [facility];  Service: Gastroenterology;  Laterality: N/A;  . ESOPHAGOGASTRODUODENOSCOPY (EGD) WITH PROPOFOL N/A 12/09/2015   Procedure: ESOPHAGOGASTRODUODENOSCOPY (EGD) WITH PROPOFOL;  Surgeon: [name], MD;  Location: [facility];  Service: Endoscopy;  Laterality: N/A;  . ESOPHAGOGASTRODUODENOSCOPY (EGD) WITH PROPOFOL N/A 12/21/2015   Procedure: ESOPHAGOGASTRODUODENOSCOPY (EGD) WITH PROPOFOL;  Surgeon: [name], MD;  Location: [facility];  Service: Endoscopy;  Laterality: N/A;  . ESOPHAGOGASTRODUODENOSCOPY (EGD) WITH PROPOFOL N/A 02/24/2016   Procedure: ESOPHAGOGASTRODUODENOSCOPY (EGD) WITH PROPOFOL;  Surgeon: [name], MD;  Location: [facility];  Service: Endoscopy;  Laterality: N/A;  diabetic - oral med  . POLYPECTOMY N/A 12/09/2015   Procedure: POLYPECTOMY;  Surgeon: [name], MD;  Location: [facility];  Service: Endoscopy;  Laterality: N/A;  . TONSILLECTOMY      Family History  Problem Relation Age of Onset  . Aneurysm Father 40       of the heart  . Heart attack Father   . Diabetes Mother     Social History:  reports that he has never smoked. He has never used smokeless tobacco. He reports that he does not drink alcohol or use drugs.  Patient denies any exposure to  radiation or toxins.  He is a Company secretary.  He lives in [place].  The patient is accompanied by his wife, [name], today.  Allergies:  Allergies  Allergen Reactions  . Niaspan [Niacin Er] Other (See Comments)    Just does not want to take     Current Medications: Current Outpatient Prescriptions  Medication Sig Dispense Refill  . Alpha-Lipoic Acid (LIPOIC ACID PO) Take 200 mg by mouth daily.     . Ascorbic Acid (VITAMIN C) 1000 MG tablet Take 1,000  mg by mouth daily.    . B Complex Vitamins (VITAMIN B COMPLEX) TABS Take by mouth daily.    . blood glucose meter kit and supplies KIT Dispense based on patient and insurance preference. Use up to four times daily as directed. E11.9 Please dispense Freestyle freedom meter and supplies. 1 each 0  . Cholecalciferol (VITAMIN D-3) 1000 UNITS CAPS Take by mouth.    . CHROMIUM PO Take 600 mg by mouth daily.     . Cinnamon 500 MG capsule Take 500 mg by mouth.    . Coenzyme Q10 100 MG capsule Take by mouth.    . furosemide (LASIX) 20 MG tablet Take 1 tablet (20 mg total) by mouth 2 (two) times daily. 60 tablet 3  . GENERLAC 10 GM/15ML SOLN TK 15 ML PO BID. TITRATE TO 2 SOFT STOOLS PER DAY. DECREASE IF YOU START HAVING DIARRHEA  3  . Ginger, Zingiber officinalis, (GINGER ROOT) 550 MG CAPS Take by mouth daily.    Marland Kitchen glipiZIDE (GLUCOTROL) 5 MG tablet TAKE 1 TABLET BY MOUTH EVERY MORNING AND 2 TABLETS BY MOUTH EVERY EVENING 270 tablet 0  . glucose blood (ONE TOUCH ULTRA TEST) test strip Use as instructed 100 each 12  . lactulose (CHRONULAC) 10 GM/15ML solution Take 15 mLs (10 g total) by mouth 2 (two) times daily. Titrate to 2 soft stools per day. Decrease if you start having diarrhea 946 mL 3  . Lancets (ONETOUCH ULTRASOFT) lancets Use as instructed 100 each 12  . lisinopril (PRINIVIL,ZESTRIL) 20 MG tablet TAKE ONE TABLET BY MOUTH EVERY DAY/ AM    . Magnesium 500 MG CAPS Take 500 mg by mouth daily.    . metFORMIN (GLUCOPHAGE) 1000 MG tablet TAKE 1 TABLET(1000 MG) BY MOUTH TWICE DAILY WITH A MEAL 60 tablet 3  . Omega-3 Fatty Acids (FISH OIL) 1000 MG CAPS Take 4,000 mg by mouth daily.     Marland Kitchen OVER THE COUNTER MEDICATION BUPLEURUM    . OVER THE COUNTER MEDICATION 250 MG CITICOLINE    . SBI/Protein Isolate (ENTERAGAM) 5 g PACK Take 1 packet by mouth daily. 30 each 3  . sildenafil (REVATIO) 20 MG tablet 3 to 5 tablets per day or as instructed    . spironolactone (ALDACTONE) 50 MG tablet Take 1 tablet (50 mg  total) by mouth daily. 30 tablet 3  . Theanine 100 MG CAPS Take by mouth daily.    Marland Kitchen triamcinolone cream (KENALOG) 0.1 % Apply topically 2 (two) times daily. Please avoid face and genitalia area.  Do not use in the same spot for more than 7-10 days in a row. 80 g 0  . Turmeric POWD 4,000 mg by Does not apply route daily.    . vitamin B-12 (CYANOCOBALAMIN) 100 MCG tablet Take 200 mcg by mouth daily.     No current facility-administered medications for this visit.    Facility-Administered Medications Ordered in Other Visits  Medication Dose Route Frequency Provider Last Rate Last  Dose  . alteplase (CATHFLO ACTIVASE) injection 2 mg  2 mg Intracatheter Once PRN Nolon Stalls C, MD      . heparin lock flush 100 unit/mL  500 Units Intracatheter Once PRN Mike Gip, Melissa C, MD      . heparin lock flush 100 unit/mL  250 Units Intracatheter Once PRN Mike Gip, Melissa C, MD      . sodium chloride 0.9 % injection 10 mL  10 mL Intracatheter PRN Corcoran, Melissa C, MD      . sodium chloride 0.9 % injection 3 mL  3 mL Intravenous Once PRN Lequita Asal, MD        Review of Systems:  GENERAL:  Fatigue x 2 days.  No fevers or sweats.  Weight up 4 pounds. PERFORMANCE STATUS (ECOG):  1 HEENT:  No visual changes, runny nose, sore throat, mouth sores or tenderness. Lungs: No shortness of breath or cough.  No hemoptysis. Cardiac:  No chest pain, palpitations, orthopnea, or PND. GI:  Diarrhea (see HPI) with abdominal cramping.  Stool dark.  No nausea, vomiting, constipation, or hematochezia.  GU:  No urgency, frequency, dysuria, or hematuria. Musculoskeletal:  No back pain.  No joint pain.  No muscle tenderness. Extremities:  No pain or swelling. Skin:  No rashes or skin changes. Neuro:  Short term memory issues.  Trouble concentrating.  Interval encephalopathy resolved with lactulose.  No headache, numbness or weakness, balance or coordination issues. Endocrine:  Diabetes.  No thyroid issues, hot  flashes or night sweats. Psych:  No mood changes, depression or anxiety. Pain:  No pain. Review of systems:  All other systems reviewed and found to be negative.  Physical Exam: BP (!) 157/85 (Patient Position: Sitting)   Pulse 83   Temp 97.2 F (36.2 C) (Tympanic)   Wt 170 lb 9.6 oz (77.4 kg)   BMI 25.19 kg/m   GENERAL:  Well developed, well nourished, gentleman sitting comfortably in the exam room in no acute distress. MENTAL STATUS:  Alert and oriented to person, place and time. HEAD:  Pearline Cables hair.  Normocephalic, atraumatic, face symmetric, no Cushingoid features. EYES:  Glasses.  Blue eyes.  Pupils equal round and reactive to light and accomodation.  No conjunctivitis or scleral icterus. ENT:  Oropharynx clear without lesion.  Tongue normal. Mucous membranes moist.  RESPIRATORY:  Clear to auscultation without rales, wheezes or rhonchi. CARDIOVASCULAR:  Regular rate and rhythm without murmur, rub or gallop. ABDOMEN:  Soft, non tender with active bowel sounds, and no appreciable hepatosplenomegaly.  No masses.  No guarding or rebound tenderness. SKIN:  No rashes, ulcers or lesions. EXTREMITIES: No edema, no skin discoloration or tenderness.  No palpable cords. LYMPH NODES: No palpable cervical, supraclavicular, axillary or inguinal adenopathy  NEUROLOGICAL: Unremarkable. PSYCH:  Appropriate.    Appointment on 07/03/2016  Component Date Value Ref Range Status  . Sodium 07/03/2016 133* 135 - 145 mmol/L Final  . Potassium 07/03/2016 4.8  3.5 - 5.1 mmol/L Final  . Chloride 07/03/2016 107  101 - 111 mmol/L Final  . CO2 07/03/2016 20* 22 - 32 mmol/L Final  . Glucose, Bld 07/03/2016 243* 65 - 99 mg/dL Final  . BUN 07/03/2016 32* 6 - 20 mg/dL Final  . Creatinine, Ser 07/03/2016 1.22  0.61 - 1.24 mg/dL Final  . Calcium 07/03/2016 9.8  8.9 - 10.3 mg/dL Final  . Total Protein 07/03/2016 8.1  6.5 - 8.1 g/dL Final  . Albumin 07/03/2016 3.2* 3.5 - 5.0 g/dL Final  . AST  07/03/2016 28  15 -  41 U/L Final  . ALT 07/03/2016 25  17 - 63 U/L Final  . Alkaline Phosphatase 07/03/2016 71  38 - 126 U/L Final  . Total Bilirubin 07/03/2016 1.1  0.3 - 1.2 mg/dL Final  . GFR calc non Af Amer 07/03/2016 56* >60 mL/min Final  . GFR calc Af Amer 07/03/2016 >60  >60 mL/min Final   Comment: (NOTE) The eGFR has been calculated using the CKD EPI equation. This calculation has not been validated in all clinical situations. eGFR's persistently <60 mL/min signify possible Chronic Kidney Disease.   . Anion gap 07/03/2016 6  5 - 15 Final  . WBC 07/03/2016 5.2  3.8 - 10.6 K/uL Final  . RBC 07/03/2016 3.82* 4.40 - 5.90 MIL/uL Final  . Hemoglobin 07/03/2016 11.2* 13.0 - 18.0 g/dL Final  . HCT 07/03/2016 32.2* 40.0 - 52.0 % Final  . MCV 07/03/2016 84.2  80.0 - 100.0 fL Final  . MCH 07/03/2016 29.3  26.0 - 34.0 pg Final  . MCHC 07/03/2016 34.8  32.0 - 36.0 g/dL Final  . RDW 07/03/2016 15.8* 11.5 - 14.5 % Final  . Platelets 07/03/2016 99* 150 - 440 K/uL Final  . Neutrophils Relative % 07/03/2016 60  % Final  . Neutro Abs 07/03/2016 3.1  1.4 - 6.5 K/uL Final  . Lymphocytes Relative 07/03/2016 21  % Final  . Lymphs Abs 07/03/2016 1.1  1.0 - 3.6 K/uL Final  . Monocytes Relative 07/03/2016 12  % Final  . Monocytes Absolute 07/03/2016 0.6  0.2 - 1.0 K/uL Final  . Eosinophils Relative 07/03/2016 6  % Final  . Eosinophils Absolute 07/03/2016 0.3  0 - 0.7 K/uL Final  . Basophils Relative 07/03/2016 1  % Final  . Basophils Absolute 07/03/2016 0.0  0 - 0.1 K/uL Final  . Ferritin 07/03/2016 16* 24 - 336 ng/mL Final    Assessment:  Zachary Buck is a 76 y.o. male with iron deficiency anemia and stage II multiple myeloma. 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 Serum immunoglobulins noted an IgG of 2930 (high). 24-hour urine revealed a 8.8% monoclonal protein (10.6 mg/24 hours).  Albumen was 3.3 on  08/26/2014. He had pneumonia in 05/2014.   Bone survey on 10/20/2014 revealed no lytic lesions. Bone survey on 09/16/2015 revealed no focal lytic lesion or acute bony abnormality.   SPEP has been followed:  2.3 gm/dL on 10/09/2014, 2.3 gm/dL on 01/18/2015, 2.3 gm/dL on 03/22/2015, 2.3 gm/dL on 06/18/2015, 2.3 gm/dL on 09/13/2015, 2.3 gm/dL on 11/08/2015, 2.7 gm/dL on 01/11/2016, 2.8 gm/dL on 02/17/2016, 2.4 gm/dL on 03/28/2016, 2.8 gm/dL on 04/24/2016, 2.3 gm/dL on 06/01/2016, and 2.5 on 07/03/2016.   Lambda free light chainswere155.3 (ratio 0.15) on 01/18/2015, 174.59 (ratio of 0.16)on 06/18/2015, 182.9 (ratio of 0.13)on 09/13/2015, 205 (ratio 0.11) on 11/08/2015, 292.7 (ratio 0.09) on 01/11/2016, and 167.0 (ratio 0.13) on 03/27/2016.  Bone marrow on 11/10/2014 revealed a 10% monoclonal plasma cell infiltrate. Marrow was hypercellular for age (40-50%) with mixed maturing hematopoiesis, relative erythroid hyperplasia and mild nonspecific dyserythropoiesis. There was patchy mild focally moderate increase in reticulin. There were no significant iron stores. Myeloma FISH panel revealed t(11;14) which results in fusion of CCND1 (BCL1) at 11q13 with the immunoglobulin heavy chain gene (IgH) at 14q32 (a favorable prognostic feature).   Bone marrow on 03/14/2016 revealed persistent monoclonal plasma cell infiltrate of 10-15%.  Marrow was hypercellular for age (  40-50%) with trilineage hematopoiesis, increase eosinophils, adequate megakaryocytes and no increase in blasts. There was mild patchy increase in reticulin fibers. There was stainable iron consistent with recent parenteral iron.  Cytogenetics were normal (46, XY). FISH studies revealed CCND1/IGH translocation t(11;14).  Beta2 microglobulin was 4.2 on 11/24/2014, 3.8 on 06/18/2015, 4.5 on 09/13/2015, 4.5 on 10/18/2015, and 4.6 on 04/24/2016.  PET scan on 12/07/2014 revealed no hypermetabolic foci within the marrow space or nodal stations to  suggest active multiple myeloma. There was hypermetabolism within the right lower lobe, corresponding to a similar dependent density. Morphology favored scarring.There was cirrhosis and portal hypertension. There was gastric body hypermetabolism favoring physiologic. Spleen was 15.3 cm (volume 1065 cc).  There was pulmonary artery enlargement suggesting pulmonary artery hypertension. 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  He has a history of asbestos exposure in the [employer].  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  Etiology is felt secondary to fatty liver.  Abdominal CT scan on 03/16/2015 revealed splenomegaly (14.5 cm), cirrhosis, and stigmata of portal hypertension with esophageal and gastric varices.  There were no liver lesions.  He is on aldactone and lactulose.  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  There were no pleural effusions.  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  There was no frank honeycombing. This finding accounted for the medial right lower lobe hypermetabolism on the 12/07/2014 PET-CT.  There was cirrhosis, splenomegaly, and prominent gastroesophageal varices.  He has had mild anemia since 10/2013 and mild thrombocytopenia since 05/2014.   Colonoscopy in 10/2013 revealed polyps.  Colonoscopy on 03/04/2015 revealed a sessile polyp was seen in the transverse colon (tubular adenoma).  Colonoscopy on 12/09/2015 revealed one 4 mm polyp in the ascending colon and two 4-5 mm polyps in the sigmoid colon (hyperplastic polyps).    EGD in 10/2013 revealed gastritis. EGD on 03/04/2015 revealed medium-sized esophageal varices in the mid and distal esophagus without bleeding. There was a sessile polyp  in the gastric antrum (hyperplastic polyp).  There were 3 small angiodysplastic lesions in the second part of the duodenum.  EGD on 12/09/2015 revealed grade II esophageal varices.  There was a single gastric polyp.  There was a single bleeding angioectasia in the duodenum treated with bipolar cautery and clips.  EGD on 02/24/2016 revealed no gross lesions in the esophagus. There was a single gastric polyp and a duodenal polyp. Resection was attempted. No specimens were collected  Capsule enteroscopy on 11/27/2014 revealed 1-2 gastric polyps and one colon polyp.  There were no findings to explain iron deficiency anemia.  His diet was good. Labs confirmed iron deficiency (ferritin 11.8 on 05/2014 and 13 on 10/09/2014). B12 was low normal (MMA normal) thus ruling out B12 deficiency.  He has chronic diarrhea (2 years).  Diarrhea improved on Enterogam (began 06/22/2015).  He has intermittent diarrhea.    He has recurrent iron deficiency anemia.  He has received Venofer weekly 3 (03/26/2015 - 04/14/2015), weekly x 3 (06/25/2015 - 07/09/2015), weekly x 3 (09/17/2015 - 10/01/2015), weekly x 3 (11/16/2015 - 11/30/2015), weekly x 2 (01/18/2016 - 01/25/2016),  weekly x 3 (02/18/2016 - 03/03/2016), and 200 mg (05/04/2016).    Ferritin has been followed:  23 on 06/18/2015, 121 on 07/26/2015, 17 on 09/13/2015, 18 on 11/15/2015, 52 on 12/13/2015, 10 on 01/11/2016, 88 on 02/03/2016,  23 on 02/17/2016, 46 on 03/27/2016, 20 on 04/24/2016, 31 on 06/01/2016, and 16 on 07/03/2016.  Symptomatically, he has been fatigued.  He continues to require lactulose secondary to hepatic encephalopathy.  Hemoglobin is 11.2 and platelets 99,000.  Ferritin is 16.  Plan: 1.  Review labs from 07/03/2016.  Hemoglobin and platelet count relatively stable. M-spike fluctuates slightly.  Discuss continued observation.  Review indications for treatment. 2.  Discuss low ferritin.  Goal is to maintain ferritin > 30.  Discuss additional IV  iron. 3.  Venofer today. 4.  Anticipate bone survey 09/15/2016. 5.  RTC weekly x 2 for Venofer. 6.  RTC in 1 month for MD assessment and labs (CBC with diff, CMP, SPEP, ferritin-day before) +/- Venofer.   Lequita Asal, MD  07/04/2016, 2:28 PM

## 2016-07-05 ENCOUNTER — Telehealth: Payer: Self-pay | Admitting: *Deleted

## 2016-07-05 NOTE — Telephone Encounter (Signed)
Called patient and LVM that M-spike has slightly increased from a month ago.  Patient can call back if questions.

## 2016-07-05 NOTE — Telephone Encounter (Signed)
-----   Message from Lequita Asal, MD sent at 07/04/2016  4:59 PM EDT ----- Regarding: Please call patient  Monoclonal spike 2.5 gm  M  ----- Message ----- From: Interface, Lab In [place] Sent: 07/03/2016   9:50 AM To: Lequita Asal, MD

## 2016-07-06 ENCOUNTER — Telehealth: Payer: Self-pay | Admitting: *Deleted

## 2016-07-06 NOTE — Telephone Encounter (Signed)
[name] asking that the nurse who called her with results of M spike please call her back to discuss this further as she feels Dr Humberto Seals must be concerned about it if you are calling her

## 2016-07-11 ENCOUNTER — Other Ambulatory Visit: Payer: Self-pay | Admitting: Hematology and Oncology

## 2016-07-11 ENCOUNTER — Inpatient Hospital Stay: Payer: PPO

## 2016-07-11 DIAGNOSIS — C9000 Multiple myeloma not having achieved remission: Secondary | ICD-10-CM | POA: Diagnosis not present

## 2016-07-11 MED ORDER — IRON SUCROSE 20 MG/ML IV SOLN
200.0000 mg | Freq: Once | INTRAVENOUS | Status: AC
  Administered 2016-07-11: 200 mg via INTRAVENOUS
  Filled 2016-07-11: qty 10

## 2016-07-11 MED ORDER — SODIUM CHLORIDE 0.9 % IV SOLN
Freq: Once | INTRAVENOUS | Status: AC
  Administered 2016-07-11: 14:00:00 via INTRAVENOUS
  Filled 2016-07-11: qty 1000

## 2016-07-12 ENCOUNTER — Ambulatory Visit (INDEPENDENT_AMBULATORY_CARE_PROVIDER_SITE_OTHER): Payer: PPO | Admitting: Ophthalmology

## 2016-07-18 ENCOUNTER — Inpatient Hospital Stay: Payer: PPO

## 2016-07-18 VITALS — BP 158/79 | HR 78 | Temp 97.0°F | Resp 18

## 2016-07-18 DIAGNOSIS — C9000 Multiple myeloma not having achieved remission: Secondary | ICD-10-CM | POA: Diagnosis not present

## 2016-07-18 DIAGNOSIS — D508 Other iron deficiency anemias: Secondary | ICD-10-CM

## 2016-07-18 MED ORDER — SODIUM CHLORIDE 0.9 % IV SOLN
Freq: Once | INTRAVENOUS | Status: AC
  Administered 2016-07-18: 14:00:00 via INTRAVENOUS
  Filled 2016-07-18: qty 1000

## 2016-07-18 MED ORDER — SODIUM CHLORIDE 0.9 % IV SOLN: 200.0000 mg | Freq: Once | INTRAVENOUS | Status: DC

## 2016-07-18 MED ORDER — IRON SUCROSE 20 MG/ML IV SOLN
200.0000 mg | Freq: Once | INTRAVENOUS | Status: AC
  Administered 2016-07-18: 200 mg via INTRAVENOUS
  Filled 2016-07-18: qty 10

## 2016-07-25 ENCOUNTER — Other Ambulatory Visit: Payer: Self-pay

## 2016-07-25 ENCOUNTER — Telehealth: Payer: Self-pay

## 2016-07-25 NOTE — Telephone Encounter (Signed)
Pt's wife called and has a medication question. She wants you to prescribe patient the expensive medication but she can't think of the name of it. You told her yesterday.  How long will he have to stay on it. Call wife before you call in any medication.

## 2016-07-31 ENCOUNTER — Telehealth: Payer: Self-pay | Admitting: *Deleted

## 2016-07-31 ENCOUNTER — Other Ambulatory Visit: Payer: Self-pay

## 2016-07-31 ENCOUNTER — Telehealth: Payer: Self-pay | Admitting: Gastroenterology

## 2016-07-31 MED ORDER — RIFAXIMIN 550 MG PO TABS: 550.0000 mg | ORAL_TABLET | Freq: Two times a day (BID) | ORAL | 11 refills | Status: AC

## 2016-07-31 NOTE — Telephone Encounter (Signed)
[place] has requested the correct sliding scale for [place]

## 2016-07-31 NOTE — Telephone Encounter (Signed)
Patient's wife would like for you to call her regarding a medication. She is still waiting on Dr. Allen Norris to call her. Please call

## 2016-07-31 NOTE — Telephone Encounter (Signed)
Called patient - no answer no vm  ?

## 2016-07-31 NOTE — Telephone Encounter (Signed)
Please confirm with pts wife if he is using a sliding scale.  Per previous discussion, I thought they were not doing a sliding scale.  If using sliding scale, can confirm with her how she is doing it.  (I usually start with if blood sugar 250-300 then give 2 units, 301-350 4 units...).  Let me know if I need to write out full sliding scale.

## 2016-07-31 NOTE — Telephone Encounter (Signed)
Please advise 

## 2016-08-01 NOTE — Telephone Encounter (Signed)
Thank you for the update.  What I would like for her to do is check and record blood sugars and send in readings over the next 2 weeks.  This way I can give her a SSI if needed and can give directions.

## 2016-08-01 NOTE — Telephone Encounter (Signed)
Discussed with pt the medication Xifaxin. Pt's wife has decided to move forward and have rx sent to her pharmacy.

## 2016-08-01 NOTE — Telephone Encounter (Signed)
Spoke to patient wife sates that she thought that you guys had decided to do a sliding scale if his readings were over 200. She states that his readings have been all over the place. It has been over 250 just a few readings. And not in a row. She wanted to know if she should give it on sliding scale if one reading is over 250 or if she should use if one reading is over 250. She did want you to know that he is on Lactulose.

## 2016-08-02 NOTE — Telephone Encounter (Signed)
Left a VM to return my call, thanks 

## 2016-08-03 ENCOUNTER — Inpatient Hospital Stay: Payer: PPO | Attending: Hematology and Oncology

## 2016-08-03 DIAGNOSIS — K317 Polyp of stomach and duodenum: Secondary | ICD-10-CM | POA: Insufficient documentation

## 2016-08-03 DIAGNOSIS — D509 Iron deficiency anemia, unspecified: Secondary | ICD-10-CM | POA: Insufficient documentation

## 2016-08-03 DIAGNOSIS — K7469 Other cirrhosis of liver: Secondary | ICD-10-CM | POA: Diagnosis not present

## 2016-08-03 DIAGNOSIS — I864 Gastric varices: Secondary | ICD-10-CM | POA: Diagnosis not present

## 2016-08-03 DIAGNOSIS — K7290 Hepatic failure, unspecified without coma: Secondary | ICD-10-CM | POA: Insufficient documentation

## 2016-08-03 DIAGNOSIS — K766 Portal hypertension: Secondary | ICD-10-CM | POA: Insufficient documentation

## 2016-08-03 DIAGNOSIS — D122 Benign neoplasm of ascending colon: Secondary | ICD-10-CM | POA: Diagnosis not present

## 2016-08-03 DIAGNOSIS — Z79899 Other long term (current) drug therapy: Secondary | ICD-10-CM | POA: Insufficient documentation

## 2016-08-03 DIAGNOSIS — K760 Fatty (change of) liver, not elsewhere classified: Secondary | ICD-10-CM | POA: Insufficient documentation

## 2016-08-03 DIAGNOSIS — Z77090 Contact with and (suspected) exposure to asbestos: Secondary | ICD-10-CM | POA: Diagnosis not present

## 2016-08-03 DIAGNOSIS — E7800 Pure hypercholesterolemia, unspecified: Secondary | ICD-10-CM | POA: Insufficient documentation

## 2016-08-03 DIAGNOSIS — J470 Bronchiectasis with acute lower respiratory infection: Secondary | ICD-10-CM | POA: Diagnosis not present

## 2016-08-03 DIAGNOSIS — I8510 Secondary esophageal varices without bleeding: Secondary | ICD-10-CM | POA: Insufficient documentation

## 2016-08-03 DIAGNOSIS — D125 Benign neoplasm of sigmoid colon: Secondary | ICD-10-CM | POA: Diagnosis not present

## 2016-08-03 DIAGNOSIS — I10 Essential (primary) hypertension: Secondary | ICD-10-CM | POA: Insufficient documentation

## 2016-08-03 DIAGNOSIS — N400 Enlarged prostate without lower urinary tract symptoms: Secondary | ICD-10-CM | POA: Insufficient documentation

## 2016-08-03 DIAGNOSIS — D472 Monoclonal gammopathy: Secondary | ICD-10-CM | POA: Diagnosis not present

## 2016-08-03 DIAGNOSIS — K649 Unspecified hemorrhoids: Secondary | ICD-10-CM | POA: Diagnosis not present

## 2016-08-03 DIAGNOSIS — C9000 Multiple myeloma not having achieved remission: Secondary | ICD-10-CM | POA: Diagnosis not present

## 2016-08-03 DIAGNOSIS — D508 Other iron deficiency anemias: Secondary | ICD-10-CM

## 2016-08-03 DIAGNOSIS — J849 Interstitial pulmonary disease, unspecified: Secondary | ICD-10-CM | POA: Diagnosis not present

## 2016-08-03 DIAGNOSIS — I2510 Atherosclerotic heart disease of native coronary artery without angina pectoris: Secondary | ICD-10-CM | POA: Diagnosis not present

## 2016-08-03 DIAGNOSIS — J61 Pneumoconiosis due to asbestos and other mineral fibers: Secondary | ICD-10-CM | POA: Insufficient documentation

## 2016-08-03 DIAGNOSIS — Z955 Presence of coronary angioplasty implant and graft: Secondary | ICD-10-CM | POA: Insufficient documentation

## 2016-08-03 DIAGNOSIS — I252 Old myocardial infarction: Secondary | ICD-10-CM | POA: Diagnosis not present

## 2016-08-03 LAB — CBC WITH DIFFERENTIAL/PLATELET
Basophils Absolute: 0.1 10*3/uL (ref 0–0.1)
Basophils Relative: 1 %
Eosinophils Absolute: 0.4 10*3/uL (ref 0–0.7)
Eosinophils Relative: 7 %
HCT: 34.7 % — ABNORMAL LOW (ref 40.0–52.0)
Hemoglobin: 11.9 g/dL — ABNORMAL LOW (ref 13.0–18.0)
Lymphocytes Relative: 20 %
Lymphs Abs: 1.2 10*3/uL (ref 1.0–3.6)
MCH: 29.3 pg (ref 26.0–34.0)
MCHC: 34.4 g/dL (ref 32.0–36.0)
MCV: 85.2 fL (ref 80.0–100.0)
Monocytes Absolute: 0.8 10*3/uL (ref 0.2–1.0)
Monocytes Relative: 13 %
Neutro Abs: 3.6 10*3/uL (ref 1.4–6.5)
Neutrophils Relative %: 59 %
Platelets: 95 10*3/uL — ABNORMAL LOW (ref 150–440)
RBC: 4.08 MIL/uL — ABNORMAL LOW (ref 4.40–5.90)
RDW: 16.7 % — ABNORMAL HIGH (ref 11.5–14.5)
WBC: 6.1 10*3/uL (ref 3.8–10.6)

## 2016-08-03 LAB — FERRITIN: Ferritin: 119 ng/mL (ref 24–336)

## 2016-08-03 LAB — COMPREHENSIVE METABOLIC PANEL
ALT: 29 U/L (ref 17–63)
AST: 32 U/L (ref 15–41)
Albumin: 3.2 g/dL — ABNORMAL LOW (ref 3.5–5.0)
Alkaline Phosphatase: 74 U/L (ref 38–126)
Anion gap: 8 (ref 5–15)
BUN: 22 mg/dL — ABNORMAL HIGH (ref 6–20)
CO2: 20 mmol/L — ABNORMAL LOW (ref 22–32)
Calcium: 9.9 mg/dL (ref 8.9–10.3)
Chloride: 104 mmol/L (ref 101–111)
Creatinine, Ser: 1.28 mg/dL — ABNORMAL HIGH (ref 0.61–1.24)
GFR calc Af Amer: >60 mL/min (ref >60)
GFR calc non Af Amer: 53 mL/min — ABNORMAL LOW (ref >60)
Glucose, Bld: 208 mg/dL — ABNORMAL HIGH (ref 65–99)
Potassium: 4.7 mmol/L (ref 3.5–5.1)
Sodium: 132 mmol/L — ABNORMAL LOW (ref 135–145)
Total Bilirubin: 1.4 mg/dL — ABNORMAL HIGH (ref 0.3–1.2)
Total Protein: 8.2 g/dL — ABNORMAL HIGH (ref 6.5–8.1)

## 2016-08-03 NOTE — Telephone Encounter (Signed)
Pt spouse called back returning your call. Please advise, thank you!  Call pt @ [phone]

## 2016-08-03 NOTE — Telephone Encounter (Signed)
Attempted to reach patient, left a detailed message with keeping record of Blood sugar readings, for 2 weeks and then submit to the office. thanks

## 2016-08-04 ENCOUNTER — Encounter: Payer: Self-pay | Admitting: Hematology and Oncology

## 2016-08-04 ENCOUNTER — Inpatient Hospital Stay: Payer: PPO

## 2016-08-04 ENCOUNTER — Inpatient Hospital Stay (HOSPITAL_BASED_OUTPATIENT_CLINIC_OR_DEPARTMENT_OTHER): Payer: PPO | Admitting: Hematology and Oncology

## 2016-08-04 ENCOUNTER — Telehealth: Payer: Self-pay | Admitting: Gastroenterology

## 2016-08-04 VITALS — BP 147/74 | HR 71 | Temp 97.2°F | Resp 18 | Wt 169.0 lb

## 2016-08-04 DIAGNOSIS — C9000 Multiple myeloma not having achieved remission: Secondary | ICD-10-CM | POA: Diagnosis not present

## 2016-08-04 DIAGNOSIS — D509 Iron deficiency anemia, unspecified: Secondary | ICD-10-CM

## 2016-08-04 DIAGNOSIS — Z79899 Other long term (current) drug therapy: Secondary | ICD-10-CM | POA: Diagnosis not present

## 2016-08-04 DIAGNOSIS — D472 Monoclonal gammopathy: Secondary | ICD-10-CM

## 2016-08-04 DIAGNOSIS — D500 Iron deficiency anemia secondary to blood loss (chronic): Secondary | ICD-10-CM

## 2016-08-04 LAB — PROTEIN ELECTROPHORESIS, SERUM
A/G Ratio: 0.7 (ref 0.7–1.7)
Albumin ELP: 3.2 g/dL (ref 2.9–4.4)
Alpha-1-Globulin: 0.2 g/dL (ref 0.0–0.4)
Alpha-2-Globulin: 0.6 g/dL (ref 0.4–1.0)
Beta Globulin: 0.9 g/dL (ref 0.7–1.3)
Gamma Globulin: 2.9 g/dL — ABNORMAL HIGH (ref 0.4–1.8)
Globulin, Total: 4.5 g/dL — ABNORMAL HIGH (ref 2.2–3.9)
M-Spike, %: 2.7 g/dL — ABNORMAL HIGH
Total Protein ELP: 7.7 g/dL (ref 6.0–8.5)

## 2016-08-04 NOTE — Progress Notes (Signed)
[facility] day:  08/04/16  Chief Complaint: Zachary Buck is a 76 y.o. male  with stage II multiple myeloma and iron deficiency anemia who is seen for 1 month assessment.  HPI: The patient was last seen in the medical oncology clinic on 07/04/2016.  At that time, he was fatigued.  He continued to require lactulose secondary to hepatic encephalopathy.  Hemoglobin was 11.2 and platelets 99,000.  SPEP was stable.  Ferritin was 16.  He received Venofer on 07/04/2016, 07/11/2016, and 07/18/2016.  Symptomatically, he states that he is "doing alright".  He has been "put on a liver diet".  He is not eating much protein, no carbs, and no red meat.  He started Xifaxan 3 days ago.  He denies any bone pain.  He has had no issues with infection.   Past Medical History:  Diagnosis Date  . Anemia   . Atherosclerotic heart disease    s/p MI, s/p stent mid circumflex  . CAD (coronary artery disease)    Dr Nehemiah Massed, cardiologist  . Cirrhosis of liver ([place])   . Clavicle fracture   . Diabetes mellitus without complication ([place])    oral med  . Elevated blood sugar    02/03/16 and 02/09/16 had BS over 400.  otherwise running around 250  . Esophageal varices ([place])   . Hepatic encephalopathy ([place])   . Hypercholesterolemia   . Hypertension    controlled on meds  . Internal hemorrhoids   . Leg fracture   . Multiple myeloma ([place])   . Myocardial infarct ([place])   . Prostate enlargement   . Tubular adenoma of colon     Past Surgical History:  Procedure Laterality Date  . COLONOSCOPY WITH PROPOFOL N/A 03/04/2015   Procedure: COLONOSCOPY WITH PROPOFOL;  Surgeon: Jerene Bears, MD;  Location: [facility];  Service: Gastroenterology;  Laterality: N/A;  . COLONOSCOPY WITH PROPOFOL N/A 12/09/2015   Procedure: COLONOSCOPY WITH PROPOFOL;  Surgeon: [name], MD;  Location: [facility];  Service: Endoscopy;  Laterality: N/A;  DIABETIC-ORAL MEDS  .  CORONARY ANGIOPLASTY WITH STENT PLACEMENT     2 stents  . ESOPHAGOGASTRODUODENOSCOPY (EGD) WITH PROPOFOL N/A 03/04/2015   Procedure: ESOPHAGOGASTRODUODENOSCOPY (EGD) WITH PROPOFOL;  Surgeon: Jerene Bears, MD;  Location: [facility];  Service: Gastroenterology;  Laterality: N/A;  . ESOPHAGOGASTRODUODENOSCOPY (EGD) WITH PROPOFOL N/A 12/09/2015   Procedure: ESOPHAGOGASTRODUODENOSCOPY (EGD) WITH PROPOFOL;  Surgeon: [name], MD;  Location: [place];  Service: Endoscopy;  Laterality: N/A;  . ESOPHAGOGASTRODUODENOSCOPY (EGD) WITH PROPOFOL N/A 12/21/2015   Procedure: ESOPHAGOGASTRODUODENOSCOPY (EGD) WITH PROPOFOL;  Surgeon: [name], MD;  Location: [facility];  Service: Endoscopy;  Laterality: N/A;  . ESOPHAGOGASTRODUODENOSCOPY (EGD) WITH PROPOFOL N/A 02/24/2016   Procedure: ESOPHAGOGASTRODUODENOSCOPY (EGD) WITH PROPOFOL;  Surgeon: [name], MD;  Location: [place];  Service: Endoscopy;  Laterality: N/A;  diabetic - oral med  . POLYPECTOMY N/A 12/09/2015   Procedure: POLYPECTOMY;  Surgeon: [name], MD;  Location: [place];  Service: Endoscopy;  Laterality: N/A;  . TONSILLECTOMY      Family History  Problem Relation Age of Onset  . Aneurysm Father 76       of the heart  . Heart attack Father   . Diabetes Mother     Social History:  reports that he has never smoked. He has never used smokeless tobacco. He reports that he does not drink alcohol or use drugs.  Patient denies any  exposure to radiation or toxins.  He is a Company secretary.  He lives in [place].  The patient is accompanied by his wife, [name], today.  Allergies:  Allergies  Allergen Reactions  . Niaspan [Niacin Er] Other (See Comments)    Just does not want to take     Current Medications: Current Outpatient Prescriptions  Medication Sig Dispense Refill  . Alpha-Lipoic Acid (LIPOIC ACID PO) Take 200 mg by mouth daily.     . Ascorbic Acid (VITAMIN C) 1000 MG tablet Take 1,000 mg by  mouth daily.    . B Complex Vitamins (VITAMIN B COMPLEX) TABS Take by mouth daily.    . blood glucose meter kit and supplies KIT Dispense based on patient and insurance preference. Use up to four times daily as directed. E11.9 Please dispense Freestyle freedom meter and supplies. 1 each 0  . Cholecalciferol (VITAMIN D-3) 1000 UNITS CAPS Take by mouth.    . CHROMIUM PO Take 600 mg by mouth daily.     . Cinnamon 500 MG capsule Take 500 mg by mouth.    . Coenzyme Q10 100 MG capsule Take by mouth.    . furosemide (LASIX) 20 MG tablet Take 1 tablet (20 mg total) by mouth 2 (two) times daily. 60 tablet 3  . GENERLAC 10 GM/15ML SOLN TK 15 ML PO BID. TITRATE TO 2 SOFT STOOLS PER DAY. DECREASE IF YOU START HAVING DIARRHEA  3  . Ginger, Zingiber officinalis, (GINGER ROOT) 550 MG CAPS Take by mouth daily.    Marland Kitchen glipiZIDE (GLUCOTROL) 5 MG tablet TAKE 1 TABLET BY MOUTH EVERY MORNING AND 2 TABLETS BY MOUTH EVERY EVENING 270 tablet 0  . glucose blood (ONE TOUCH ULTRA TEST) test strip Use as instructed 100 each 12  . lactulose (CHRONULAC) 10 GM/15ML solution Take 15 mLs (10 g total) by mouth 2 (two) times daily. Titrate to 2 soft stools per day. Decrease if you start having diarrhea 946 mL 3  . Lancets (ONETOUCH ULTRASOFT) lancets Use as instructed 100 each 12  . lisinopril (PRINIVIL,ZESTRIL) 20 MG tablet TAKE ONE TABLET BY MOUTH EVERY DAY/ AM    . Magnesium 500 MG CAPS Take 500 mg by mouth daily.    . metFORMIN (GLUCOPHAGE) 1000 MG tablet TAKE 1 TABLET(1000 MG) BY MOUTH TWICE DAILY WITH A MEAL 60 tablet 3  . Omega-3 Fatty Acids (FISH OIL) 1000 MG CAPS Take 4,000 mg by mouth daily.     Marland Kitchen OVER THE COUNTER MEDICATION BUPLEURUM    . OVER THE COUNTER MEDICATION 250 MG CITICOLINE    . rifaximin (XIFAXAN) 550 MG TABS tablet Take 1 tablet (550 mg total) by mouth 2 (two) times daily. 60 tablet 11  . SBI/Protein Isolate (ENTERAGAM) 5 g PACK Take 1 packet by mouth daily. 30 each 3  . sildenafil (REVATIO) 20 MG tablet 3  to 5 tablets per day or as instructed    . spironolactone (ALDACTONE) 50 MG tablet Take 1 tablet (50 mg total) by mouth daily. 30 tablet 3  . Theanine 100 MG CAPS Take by mouth daily.    Marland Kitchen triamcinolone cream (KENALOG) 0.1 % Apply topically 2 (two) times daily. Please avoid face and genitalia area.  Do not use in the same spot for more than 7-10 days in a row. 80 g 0  . Turmeric POWD 4,000 mg by Does not apply route daily.    . vitamin B-12 (CYANOCOBALAMIN) 100 MCG tablet Take 200 mcg by mouth daily.  No current facility-administered medications for this visit.    Facility-Administered Medications Ordered in Other Visits  Medication Dose Route Frequency Provider Last Rate Last Dose  . alteplase (CATHFLO ACTIVASE) injection 2 mg  2 mg Intracatheter Once PRN Nolon Stalls C, MD      . heparin lock flush 100 unit/mL  500 Units Intracatheter Once PRN Mike Gip, Melissa C, MD      . heparin lock flush 100 unit/mL  250 Units Intracatheter Once PRN Mike Gip, Melissa C, MD      . sodium chloride 0.9 % injection 10 mL  10 mL Intracatheter PRN Corcoran, Melissa C, MD      . sodium chloride 0.9 % injection 3 mL  3 mL Intravenous Once PRN Lequita Asal, MD        Review of Systems:  GENERAL:  Doing "alright".  No fevers or sweats.  Weight down 1 pound. PERFORMANCE STATUS (ECOG):  1 HEENT:  No visual changes, runny nose, sore throat, mouth sores or tenderness. Lungs: No shortness of breath or cough.  No hemoptysis. Cardiac:  No chest pain, palpitations, orthopnea, or PND. GI:  No nausea, vomiting, diarrhea, constipation, or hematochezia.  GU:  No urgency, frequency, dysuria, or hematuria. Musculoskeletal:  No back pain.  No joint pain.  No muscle tenderness. Extremities:  No pain or swelling. Skin:  No rashes or skin changes. Neuro:  Short term memory issues.  Trouble concentrating.  No headache, numbness or weakness, balance or coordination issues. Endocrine:  Diabetes.  No thyroid  issues, hot flashes or night sweats. Psych:  No mood changes, depression or anxiety. Pain:  No pain. Review of systems:  All other systems reviewed and found to be negative.  Physical Exam: BP (!) 147/74 (BP Location: Right Arm, Patient Position: Sitting)   Pulse 71   Temp 97.2 F (36.2 C) (Tympanic)   Resp 18   Wt 169 lb (76.7 kg)   BMI 24.96 kg/m   GENERAL:  Well developed, well nourished, gentleman sitting comfortably in the exam room in no acute distress. MENTAL STATUS:  Alert and oriented to person, place and time. HEAD:  Pearline Cables hair.  Normocephalic, atraumatic, face symmetric, no Cushingoid features. EYES:  Glasses.  Blue eyes.  Pupils equal round and reactive to light and accomodation.  No conjunctivitis or scleral icterus. ENT:  Oropharynx clear without lesion.  Tongue normal. Mucous membranes moist.  RESPIRATORY:  Clear to auscultation without rales, wheezes or rhonchi. CARDIOVASCULAR:  Regular rate and rhythm without murmur, rub or gallop. ABDOMEN:  Soft, non tender with active bowel sounds, and no appreciable hepatosplenomegaly.  No masses.  No guarding or rebound tenderness. SKIN:  No rashes, ulcers or lesions. EXTREMITIES: No edema, no skin discoloration or tenderness.  No palpable cords. LYMPH NODES: No palpable cervical, supraclavicular, axillary or inguinal adenopathy  NEUROLOGICAL: Unremarkable. PSYCH:  Appropriate.    Appointment on 08/03/2016  Component Date Value Ref Range Status  . Sodium 08/03/2016 132* 135 - 145 mmol/L Final  . Potassium 08/03/2016 4.7  3.5 - 5.1 mmol/L Final  . Chloride 08/03/2016 104  101 - 111 mmol/L Final  . CO2 08/03/2016 20* 22 - 32 mmol/L Final  . Glucose, Bld 08/03/2016 208* 65 - 99 mg/dL Final  . BUN 08/03/2016 22* 6 - 20 mg/dL Final  . Creatinine, Ser 08/03/2016 1.28* 0.61 - 1.24 mg/dL Final  . Calcium 08/03/2016 9.9  8.9 - 10.3 mg/dL Final  . Total Protein 08/03/2016 8.2* 6.5 - 8.1 g/dL Final  .  Albumin 08/03/2016 3.2* 3.5 - 5.0  g/dL Final  . AST 08/03/2016 32  15 - 41 U/L Final  . ALT 08/03/2016 29  17 - 63 U/L Final  . Alkaline Phosphatase 08/03/2016 74  38 - 126 U/L Final  . Total Bilirubin 08/03/2016 1.4* 0.3 - 1.2 mg/dL Final  . GFR calc non Af Amer 08/03/2016 53* >60 mL/min Final  . GFR calc Af Amer 08/03/2016 >60  >60 mL/min Final   Comment: (NOTE) The eGFR has been calculated using the CKD EPI equation. This calculation has not been validated in all clinical situations. eGFR's persistently <60 mL/min signify possible Chronic Kidney Disease.   . Anion gap 08/03/2016 8  5 - 15 Final  . WBC 08/03/2016 6.1  3.8 - 10.6 K/uL Final  . RBC 08/03/2016 4.08* 4.40 - 5.90 MIL/uL Final  . Hemoglobin 08/03/2016 11.9* 13.0 - 18.0 g/dL Final  . HCT 08/03/2016 34.7* 40.0 - 52.0 % Final  . MCV 08/03/2016 85.2  80.0 - 100.0 fL Final  . MCH 08/03/2016 29.3  26.0 - 34.0 pg Final  . MCHC 08/03/2016 34.4  32.0 - 36.0 g/dL Final  . RDW 08/03/2016 16.7* 11.5 - 14.5 % Final  . Platelets 08/03/2016 95* 150 - 440 K/uL Final  . Neutrophils Relative % 08/03/2016 59  % Final  . Neutro Abs 08/03/2016 3.6  1.4 - 6.5 K/uL Final  . Lymphocytes Relative 08/03/2016 20  % Final  . Lymphs Abs 08/03/2016 1.2  1.0 - 3.6 K/uL Final  . Monocytes Relative 08/03/2016 13  % Final  . Monocytes Absolute 08/03/2016 0.8  0.2 - 1.0 K/uL Final  . Eosinophils Relative 08/03/2016 7  % Final  . Eosinophils Absolute 08/03/2016 0.4  0 - 0.7 K/uL Final  . Basophils Relative 08/03/2016 1  % Final  . Basophils Absolute 08/03/2016 0.1  0 - 0.1 K/uL Final  . Ferritin 08/03/2016 119  24 - 336 ng/mL Final    Assessment:  Zachary Buck is a 76 y.o. male with iron deficiency anemia and stage II multiple myeloma. 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 Serum immunoglobulins noted an IgG of 2930 (high). 24-hour urine revealed a 8.8% monoclonal protein (10.6  mg/24 hours).  Albumen was 3.3 on 08/26/2014. He had pneumonia in 05/2014.   Bone survey on 10/20/2014 revealed no lytic lesions. Bone survey on 09/16/2015 revealed no focal lytic lesion or acute bony abnormality.   SPEP has been followed:  2.3 gm/dL on 10/09/2014, 2.3 gm/dL on 01/18/2015, 2.3 gm/dL on 03/22/2015, 2.3 gm/dL on 06/18/2015, 2.3 gm/dL on 09/13/2015, 2.3 gm/dL on 11/08/2015, 2.7 gm/dL on 01/11/2016, 2.8 gm/dL on 02/17/2016, 2.4 gm/dL on 03/28/2016, 2.8 gm/dL on 04/24/2016, 2.3 gm/dL on 06/01/2016, 2.5 on 07/03/2016, and 2.7 on on 08/03/2016.   Lambda free light chainswere155.3 (ratio 0.15) on 01/18/2015, 174.59 (ratio of 0.16)on 06/18/2015, 182.9 (ratio of 0.13)on 09/13/2015, 205 (ratio 0.11) on 11/08/2015, 292.7 (ratio 0.09) on 01/11/2016, and 167.0 (ratio 0.13) on 03/27/2016.  Bone marrow on 11/10/2014 revealed a 10% monoclonal plasma cell infiltrate. Marrow was hypercellular for age (40-50%) with mixed maturing hematopoiesis, relative erythroid hyperplasia and mild nonspecific dyserythropoiesis. There was patchy mild focally moderate increase in reticulin. There were no significant iron stores. Myeloma FISH panel revealed t(11;14) which results in fusion of CCND1 (BCL1) at 11q13 with the immunoglobulin heavy chain gene (IgH) at 14q32 (a favorable prognostic feature).   Bone marrow on  03/14/2016 revealed persistent monoclonal plasma cell infiltrate of 10-15%.  Marrow was hypercellular for age (40-50%) with trilineage hematopoiesis, increase eosinophils, adequate megakaryocytes and no increase in blasts. There was mild patchy increase in reticulin fibers. There was stainable iron consistent with recent parenteral iron.  Cytogenetics were normal (46, XY). FISH studies revealed CCND1/IGH translocation t(11;14).  Beta2 microglobulin was 4.2 on 11/24/2014, 3.8 on 06/18/2015, 4.5 on 09/13/2015, 4.5 on 10/18/2015, and 4.6 on 04/24/2016.  PET scan on 12/07/2014 revealed no  hypermetabolic foci within the marrow space or nodal stations to suggest active multiple myeloma. There was hypermetabolism within the right lower lobe, corresponding to a similar dependent density. Morphology favored scarring.There was cirrhosis and portal hypertension. There was gastric body hypermetabolism favoring physiologic. Spleen was 15.3 cm (volume 1065 cc).  There was pulmonary artery enlargement suggesting pulmonary artery hypertension. 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  He has a history of asbestos exposure in the [employer].  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  Etiology is felt secondary to fatty liver.  Abdominal CT scan on 03/16/2015 revealed splenomegaly (14.5 cm), cirrhosis, and stigmata of portal hypertension with esophageal and gastric varices.  There were no liver lesions.  He is on aldactone and lactulose.  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  There were no pleural effusions.  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  There was no frank honeycombing. This finding accounted for the medial right lower lobe hypermetabolism on the 12/07/2014 PET-CT.  There was cirrhosis, splenomegaly, and prominent gastroesophageal varices.  He has had mild anemia since 10/2013 and mild thrombocytopenia since 05/2014.   Colonoscopy in 10/2013 revealed polyps.  Colonoscopy on 03/04/2015 revealed a sessile polyp was seen in the transverse colon (tubular adenoma).  Colonoscopy on 12/09/2015 revealed one 4 mm polyp in the ascending colon and two 4-5 mm polyps in the sigmoid colon (hyperplastic polyps).    EGD in 10/2013 revealed gastritis. EGD on 03/04/2015 revealed medium-sized esophageal varices in the mid  and distal esophagus without bleeding. There was a sessile polyp in the gastric antrum (hyperplastic polyp).  There were 3 small angiodysplastic lesions in the second part of the duodenum.  EGD on 12/09/2015 revealed grade II esophageal varices.  There was a single gastric polyp.  There was a single bleeding angioectasia in the duodenum treated with bipolar cautery and clips.  EGD on 02/24/2016 revealed no gross lesions in the esophagus. There was a single gastric polyp and a duodenal polyp. Resection was attempted. No specimens were collected  Capsule enteroscopy on 11/27/2014 revealed 1-2 gastric polyps and one colon polyp.  There were no findings to explain iron deficiency anemia.  His diet was good. Labs confirmed iron deficiency (ferritin 11.8 on 05/2014 and 13 on 10/09/2014). B12 was low normal (MMA normal) thus ruling out B12 deficiency.  He has chronic diarrhea (2 years).  Diarrhea improved on Enterogam (began 06/22/2015).  He has intermittent diarrhea.    He has recurrent iron deficiency anemia.  He has received Venofer weekly 3 (03/26/2015 - 04/14/2015), weekly x 3 (06/25/2015 - 07/09/2015), weekly x 3 (09/17/2015 - 10/01/2015), weekly x 3 (11/16/2015 - 11/30/2015), weekly x 2 (01/18/2016 - 01/25/2016),  weekly x 3 (02/18/2016 - 03/03/2016), and 200 mg (05/04/2016).    Ferritin has been followed:  23 on 06/18/2015, 121 on 07/26/2015,  17 on 09/13/2015, 18 on 11/15/2015, 52 on 12/13/2015, 10 on 01/11/2016, 88 on 02/03/2016, 23 on 02/17/2016, 46 on 03/27/2016, 20 on 04/24/2016, 31 on 06/01/2016, 16 on 07/03/2016, 119 on 08/04/2016.  Symptomatically, he is "doing alright".  He requires lactulose secondary to hepatic encephalopathy.  Hematocrit is 34.7, hemoglobin is 11.9 and platelets 95,000.  Ferritin is 119.  Plan: 1.  Review labs from 08/03/2016.  Counts stable. 2.  Discuss adequate ferritin.  Goal is to maintain ferritin > 30.  3.  No Venofer today 4.  RTC in 1 month for labs (CBC with  diff, BMP, SPEP, ferritin). 5.  RTC in 2 month for MD assessment and labs (CBC with diff, CMP, SPEP, ferritin-day before).   Lequita Asal, MD  08/04/2016, 1:49 PM

## 2016-08-04 NOTE — Telephone Encounter (Signed)
Please look at labs for sodium and potassium due to his fatter liver.

## 2016-08-04 NOTE — Telephone Encounter (Signed)
Spoke with patient, she did receive the message to keep track of Blood sugars and report them in 2 weeks, thanks

## 2016-08-04 NOTE — Progress Notes (Signed)
Patient offers no complaints today. 

## 2016-08-04 NOTE — Telephone Encounter (Signed)
Dr. Allen Norris, please review sodium and potassium results per pt request. Pt had labs done yesterday (08/03/16) by Dr. Mike Gip.

## 2016-08-07 ENCOUNTER — Telehealth: Payer: Self-pay | Admitting: *Deleted

## 2016-08-07 NOTE — Telephone Encounter (Signed)
Called and discussed lab results with wife, voiced understanding.

## 2016-08-07 NOTE — Telephone Encounter (Signed)
Let the patient know that the sodium and potassium are stable.

## 2016-08-08 ENCOUNTER — Telehealth: Payer: Self-pay | Admitting: *Deleted

## 2016-08-08 NOTE — Telephone Encounter (Signed)
Wagner requested a call for clarity on novolog and possible sliding scale for this Rx Contact Gerald Stabs form [place]  [phone]

## 2016-08-08 NOTE — Telephone Encounter (Signed)
Thought he was no doing the sliding scale

## 2016-08-09 NOTE — Telephone Encounter (Signed)
Called patients wife to verify as well as let the pharmacy know that he is not taking at this time.

## 2016-08-09 NOTE — Telephone Encounter (Signed)
See previous phone message.  I do not mind doing a SSI.  They were supposed to check and record blood sugars and send in readings and I would give SSI recommendations if needed.

## 2016-08-24 ENCOUNTER — Telehealth: Payer: Self-pay | Admitting: Internal Medicine

## 2016-08-24 NOTE — Telephone Encounter (Signed)
Pt declined AWV. °

## 2016-08-28 ENCOUNTER — Encounter: Payer: Self-pay | Admitting: Internal Medicine

## 2016-08-29 MED ORDER — TRIAMCINOLONE ACETONIDE 0.1 % EX CREA: TOPICAL_CREAM | Freq: Two times a day (BID) | CUTANEOUS | 0 refills | Status: DC

## 2016-08-29 NOTE — Telephone Encounter (Signed)
Given that his sugars are remaining in this range, I would hold on a Sliding scale at this time.  These are actually much better than what they were running when we had initially discussed sliding scale insulin.  I have sent in rx for Triamcinolone cream.

## 2016-08-30 ENCOUNTER — Inpatient Hospital Stay: Payer: PPO | Attending: Hematology and Oncology

## 2016-08-30 DIAGNOSIS — N400 Enlarged prostate without lower urinary tract symptoms: Secondary | ICD-10-CM | POA: Diagnosis not present

## 2016-08-30 DIAGNOSIS — Z79899 Other long term (current) drug therapy: Secondary | ICD-10-CM | POA: Diagnosis not present

## 2016-08-30 DIAGNOSIS — J849 Interstitial pulmonary disease, unspecified: Secondary | ICD-10-CM | POA: Diagnosis not present

## 2016-08-30 DIAGNOSIS — I252 Old myocardial infarction: Secondary | ICD-10-CM | POA: Diagnosis not present

## 2016-08-30 DIAGNOSIS — Z955 Presence of coronary angioplasty implant and graft: Secondary | ICD-10-CM | POA: Insufficient documentation

## 2016-08-30 DIAGNOSIS — J61 Pneumoconiosis due to asbestos and other mineral fibers: Secondary | ICD-10-CM | POA: Insufficient documentation

## 2016-08-30 DIAGNOSIS — I8510 Secondary esophageal varices without bleeding: Secondary | ICD-10-CM | POA: Insufficient documentation

## 2016-08-30 DIAGNOSIS — K7469 Other cirrhosis of liver: Secondary | ICD-10-CM | POA: Diagnosis not present

## 2016-08-30 DIAGNOSIS — I2510 Atherosclerotic heart disease of native coronary artery without angina pectoris: Secondary | ICD-10-CM | POA: Diagnosis not present

## 2016-08-30 DIAGNOSIS — J470 Bronchiectasis with acute lower respiratory infection: Secondary | ICD-10-CM | POA: Diagnosis not present

## 2016-08-30 DIAGNOSIS — K760 Fatty (change of) liver, not elsewhere classified: Secondary | ICD-10-CM | POA: Diagnosis not present

## 2016-08-30 DIAGNOSIS — K766 Portal hypertension: Secondary | ICD-10-CM | POA: Diagnosis not present

## 2016-08-30 DIAGNOSIS — D122 Benign neoplasm of ascending colon: Secondary | ICD-10-CM | POA: Insufficient documentation

## 2016-08-30 DIAGNOSIS — D509 Iron deficiency anemia, unspecified: Secondary | ICD-10-CM | POA: Diagnosis not present

## 2016-08-30 DIAGNOSIS — I10 Essential (primary) hypertension: Secondary | ICD-10-CM | POA: Diagnosis not present

## 2016-08-30 DIAGNOSIS — K648 Other hemorrhoids: Secondary | ICD-10-CM | POA: Diagnosis not present

## 2016-08-30 DIAGNOSIS — I864 Gastric varices: Secondary | ICD-10-CM | POA: Insufficient documentation

## 2016-08-30 DIAGNOSIS — E7800 Pure hypercholesterolemia, unspecified: Secondary | ICD-10-CM | POA: Diagnosis not present

## 2016-08-30 DIAGNOSIS — Z77090 Contact with and (suspected) exposure to asbestos: Secondary | ICD-10-CM | POA: Insufficient documentation

## 2016-08-30 DIAGNOSIS — D500 Iron deficiency anemia secondary to blood loss (chronic): Secondary | ICD-10-CM

## 2016-08-30 DIAGNOSIS — K7290 Hepatic failure, unspecified without coma: Secondary | ICD-10-CM | POA: Diagnosis not present

## 2016-08-30 DIAGNOSIS — D125 Benign neoplasm of sigmoid colon: Secondary | ICD-10-CM | POA: Diagnosis not present

## 2016-08-30 DIAGNOSIS — K317 Polyp of stomach and duodenum: Secondary | ICD-10-CM | POA: Diagnosis not present

## 2016-08-30 DIAGNOSIS — C9000 Multiple myeloma not having achieved remission: Secondary | ICD-10-CM | POA: Insufficient documentation

## 2016-08-30 LAB — BASIC METABOLIC PANEL
Anion gap: 8 (ref 5–15)
BUN: 28 mg/dL — ABNORMAL HIGH (ref 6–20)
CO2: 23 mmol/L (ref 22–32)
Calcium: 10.1 mg/dL (ref 8.9–10.3)
Chloride: 103 mmol/L (ref 101–111)
Creatinine, Ser: 1.22 mg/dL (ref 0.61–1.24)
GFR calc Af Amer: >60 mL/min (ref >60)
GFR calc non Af Amer: 56 mL/min — ABNORMAL LOW (ref >60)
Glucose, Bld: 272 mg/dL — ABNORMAL HIGH (ref 65–99)
Potassium: 4.8 mmol/L (ref 3.5–5.1)
Sodium: 134 mmol/L — ABNORMAL LOW (ref 135–145)

## 2016-08-30 LAB — CBC WITH DIFFERENTIAL/PLATELET
Basophils Absolute: 0.1 10*3/uL (ref 0–0.1)
Basophils Relative: 1 %
Eosinophils Absolute: 0.4 10*3/uL (ref 0–0.7)
Eosinophils Relative: 7 %
HCT: 34.3 % — ABNORMAL LOW (ref 40.0–52.0)
Hemoglobin: 12 g/dL — ABNORMAL LOW (ref 13.0–18.0)
Lymphocytes Relative: 19 %
Lymphs Abs: 1.1 10*3/uL (ref 1.0–3.6)
MCH: 29.8 pg (ref 26.0–34.0)
MCHC: 34.8 g/dL (ref 32.0–36.0)
MCV: 85.6 fL (ref 80.0–100.0)
Monocytes Absolute: 0.6 10*3/uL (ref 0.2–1.0)
Monocytes Relative: 11 %
Neutro Abs: 3.4 10*3/uL (ref 1.4–6.5)
Neutrophils Relative %: 62 %
Platelets: 88 10*3/uL — ABNORMAL LOW (ref 150–440)
RBC: 4.01 MIL/uL — ABNORMAL LOW (ref 4.40–5.90)
RDW: 16.4 % — ABNORMAL HIGH (ref 11.5–14.5)
WBC: 5.5 10*3/uL (ref 3.8–10.6)

## 2016-08-30 LAB — FERRITIN: Ferritin: 55 ng/mL (ref 24–336)

## 2016-08-31 ENCOUNTER — Other Ambulatory Visit: Payer: Self-pay | Admitting: Internal Medicine

## 2016-08-31 ENCOUNTER — Telehealth: Payer: Self-pay | Admitting: *Deleted

## 2016-08-31 LAB — PROTEIN ELECTROPHORESIS, SERUM
A/G Ratio: 0.7 (ref 0.7–1.7)
Albumin ELP: 3.2 g/dL (ref 2.9–4.4)
Alpha-1-Globulin: 0.2 g/dL (ref 0.0–0.4)
Alpha-2-Globulin: 0.6 g/dL (ref 0.4–1.0)
Beta Globulin: 0.9 g/dL (ref 0.7–1.3)
Gamma Globulin: 2.9 g/dL — ABNORMAL HIGH (ref 0.4–1.8)
Globulin, Total: 4.6 g/dL — ABNORMAL HIGH (ref 2.2–3.9)
M-Spike, %: 2.8 g/dL — ABNORMAL HIGH
Total Protein ELP: 7.8 g/dL (ref 6.0–8.5)

## 2016-08-31 MED ORDER — TRIAMCINOLONE ACETONIDE 0.1 % EX CREA: TOPICAL_CREAM | Freq: Two times a day (BID) | CUTANEOUS | 0 refills | Status: DC

## 2016-08-31 NOTE — Telephone Encounter (Signed)
-----   Message from Lequita Asal, MD sent at 08/30/2016  3:14 PM EDT ----- Regarding: Please call patient   Ensure he is not taking any supplemental calcium.  M  ----- Message ----- From: Interface, Lab In [place] Sent: 08/30/2016  10:39 AM To: Lequita Asal, MD

## 2016-08-31 NOTE — Addendum Note (Signed)
Addended by: Bevelyn Ngo on: 08/31/2016 09:56 AM   Modules accepted: Orders

## 2016-08-31 NOTE — Telephone Encounter (Signed)
Called and verified with patient and his wife that patient is not taking any calcium supplements.

## 2016-09-01 ENCOUNTER — Other Ambulatory Visit: Payer: PPO

## 2016-09-15 ENCOUNTER — Ambulatory Visit (INDEPENDENT_AMBULATORY_CARE_PROVIDER_SITE_OTHER): Payer: PPO | Admitting: Internal Medicine

## 2016-09-15 ENCOUNTER — Encounter: Payer: Self-pay | Admitting: Internal Medicine

## 2016-09-15 DIAGNOSIS — E119 Type 2 diabetes mellitus without complications: Secondary | ICD-10-CM

## 2016-09-15 DIAGNOSIS — D8989 Other specified disorders involving the immune mechanism, not elsewhere classified: Secondary | ICD-10-CM

## 2016-09-15 DIAGNOSIS — D696 Thrombocytopenia, unspecified: Secondary | ICD-10-CM

## 2016-09-15 DIAGNOSIS — I8510 Secondary esophageal varices without bleeding: Secondary | ICD-10-CM | POA: Diagnosis not present

## 2016-09-15 DIAGNOSIS — K766 Portal hypertension: Secondary | ICD-10-CM

## 2016-09-15 DIAGNOSIS — D500 Iron deficiency anemia secondary to blood loss (chronic): Secondary | ICD-10-CM | POA: Diagnosis not present

## 2016-09-15 DIAGNOSIS — C9000 Multiple myeloma not having achieved remission: Secondary | ICD-10-CM | POA: Diagnosis not present

## 2016-09-15 DIAGNOSIS — K7469 Other cirrhosis of liver: Secondary | ICD-10-CM | POA: Diagnosis not present

## 2016-09-15 DIAGNOSIS — I10 Essential (primary) hypertension: Secondary | ICD-10-CM | POA: Diagnosis not present

## 2016-09-15 DIAGNOSIS — E7800 Pure hypercholesterolemia, unspecified: Secondary | ICD-10-CM | POA: Diagnosis not present

## 2016-09-15 DIAGNOSIS — I2510 Atherosclerotic heart disease of native coronary artery without angina pectoris: Secondary | ICD-10-CM | POA: Diagnosis not present

## 2016-09-15 NOTE — Progress Notes (Signed)
Patient ID: Zachary Buck, male   DOB: 20-Oct-1940, 76 y.o.   MRN: [id number]   Subjective:    Patient ID: Zachary Buck, male    DOB: [birth year], 76 y.o.   MRN: [id number]  HPI  Patient here for a scheduled follow up.  He reports things are relatively stable.  Checking his sugars.  States averaging 200.  Trying to stay active.  No chest pain.  No sob.  No acid reflux.  Abdominal pain - stable.  Bowels are moving.  Some fatigue. Seeing oncology and Dr Allen Norris.     Past Medical History:  Diagnosis Date  . Anemia   . Atherosclerotic heart disease    s/p MI, s/p stent mid circumflex  . CAD (coronary artery disease)    Dr Nehemiah Massed, cardiologist  . Cirrhosis of liver ([place])   . Clavicle fracture   . Diabetes mellitus without complication ([place])    oral med  . Elevated blood sugar    02/03/16 and 02/09/16 had BS over 400.  otherwise running around 250  . Esophageal varices ([place])   . Hepatic encephalopathy ([place])   . Hypercholesterolemia   . Hypertension    controlled on meds  . Internal hemorrhoids   . Leg fracture   . Multiple myeloma ([place])   . Myocardial infarct ([place])   . Prostate enlargement   . Tubular adenoma of colon    Past Surgical History:  Procedure Laterality Date  . COLONOSCOPY WITH PROPOFOL N/A 03/04/2015   Procedure: COLONOSCOPY WITH PROPOFOL;  Surgeon: Jerene Bears, MD;  [facility];  Service: Gastroenterology;  Laterality: N/A;  . COLONOSCOPY WITH PROPOFOL N/A 12/09/2015   Procedure: COLONOSCOPY WITH PROPOFOL;  Surgeon: [name], MD;  Location: [place];  Service: Endoscopy;  Laterality: N/A;  DIABETIC-ORAL MEDS  . CORONARY ANGIOPLASTY WITH STENT PLACEMENT     2 stents  . ESOPHAGOGASTRODUODENOSCOPY (EGD) WITH PROPOFOL N/A 03/04/2015   Procedure: ESOPHAGOGASTRODUODENOSCOPY (EGD) WITH PROPOFOL;  Surgeon: Jerene Bears, MD;  [facility];  Service: Gastroenterology;  Laterality: N/A;  . ESOPHAGOGASTRODUODENOSCOPY (EGD)  WITH PROPOFOL N/A 12/09/2015   Procedure: ESOPHAGOGASTRODUODENOSCOPY (EGD) WITH PROPOFOL;  Surgeon: [name], MD;  Location: [place];  Service: Endoscopy;  Laterality: N/A;  . ESOPHAGOGASTRODUODENOSCOPY (EGD) WITH PROPOFOL N/A 12/21/2015   Procedure: ESOPHAGOGASTRODUODENOSCOPY (EGD) WITH PROPOFOL;  Surgeon: [name], MD;  Location: [facility];  Service: Endoscopy;  Laterality: N/A;  . ESOPHAGOGASTRODUODENOSCOPY (EGD) WITH PROPOFOL N/A 02/24/2016   Procedure: ESOPHAGOGASTRODUODENOSCOPY (EGD) WITH PROPOFOL;  Surgeon: [name], MD;  Location: [place];  Service: Endoscopy;  Laterality: N/A;  diabetic - oral med  . POLYPECTOMY N/A 12/09/2015   Procedure: POLYPECTOMY;  Surgeon: [name], MD;  Location: [place];  Service: Endoscopy;  Laterality: N/A;  . TONSILLECTOMY     Family History  Problem Relation Age of Onset  . Aneurysm Father 43       of the heart  . Heart attack Father   . Diabetes Mother    Social History   Social History  . Marital status: Married    Spouse name: N/A  . Number of children: 2  . Years of education: N/A   Occupational History  . Estate manager/land agent   Social History Main Topics  . Smoking status: Never Smoker  . Smokeless tobacco: Never Used  . Alcohol use No  . Drug use: No  . Sexual activity: Not Asked   Other Topics Concern  . None  Social History Narrative  . None    Outpatient Encounter Prescriptions as of 09/15/2016  Medication Sig  . Alpha-Lipoic Acid (LIPOIC ACID PO) Take 200 mg by mouth daily.   . Ascorbic Acid (VITAMIN C) 1000 MG tablet Take 1,000 mg by mouth daily.  . B Complex Vitamins (VITAMIN B COMPLEX) TABS Take by mouth daily.  . blood glucose meter kit and supplies KIT Dispense based on patient and insurance preference. Use up to four times daily as directed. E11.9 Please dispense Freestyle freedom meter and supplies.  . Cholecalciferol (VITAMIN D-3) 1000 UNITS CAPS Take by mouth.  .  CHROMIUM PO Take 600 mg by mouth daily.   . Cinnamon 500 MG capsule Take 500 mg by mouth.  . Coenzyme Q10 100 MG capsule Take by mouth.  . furosemide (LASIX) 20 MG tablet Take 1 tablet (20 mg total) by mouth 2 (two) times daily.  Marland Kitchen GENERLAC 10 GM/15ML SOLN TK 15 ML PO BID. TITRATE TO 2 SOFT STOOLS PER DAY. DECREASE IF YOU START HAVING DIARRHEA  . Ginger, Zingiber officinalis, (GINGER ROOT) 550 MG CAPS Take by mouth daily.  Marland Kitchen glipiZIDE (GLUCOTROL) 5 MG tablet TAKE 1 TABLET BY MOUTH EVERY MORNING AND 2 TABLETS BY MOUTH EVERY EVENING  . glucose blood (ONE TOUCH ULTRA TEST) test strip Use as instructed  . lactulose (CHRONULAC) 10 GM/15ML solution Take 15 mLs (10 g total) by mouth 2 (two) times daily. Titrate to 2 soft stools per day. Decrease if you start having diarrhea  . Lancets (ONETOUCH ULTRASOFT) lancets Use as instructed  . lisinopril (PRINIVIL,ZESTRIL) 20 MG tablet TAKE ONE TABLET BY MOUTH EVERY DAY/ AM  . Magnesium 500 MG CAPS Take 500 mg by mouth daily.  . metFORMIN (GLUCOPHAGE) 1000 MG tablet TAKE 1 TABLET BY MOUTH TWICE (2) DAILY WITH A MEAL  . Omega-3 Fatty Acids (FISH OIL) 1000 MG CAPS Take 4,000 mg by mouth daily.   Marland Kitchen OVER THE COUNTER MEDICATION BUPLEURUM  . OVER THE COUNTER MEDICATION 250 MG CITICOLINE  . rifaximin (XIFAXAN) 550 MG TABS tablet Take 1 tablet (550 mg total) by mouth 2 (two) times daily.  . SBI/Protein Isolate (ENTERAGAM) 5 g PACK Take 1 packet by mouth daily.  . sildenafil (REVATIO) 20 MG tablet 3 to 5 tablets per day or as instructed  . spironolactone (ALDACTONE) 50 MG tablet Take 1 tablet (50 mg total) by mouth daily.  . Theanine 100 MG CAPS Take by mouth daily.  Marland Kitchen triamcinolone cream (KENALOG) 0.1 % Apply topically 2 (two) times daily. Please avoid face and genitalia area.  Do not use in the same spot for more than 7-10 days in a row. Pharmacy changed to [facility].  . Turmeric POWD 4,000 mg by Does not apply route daily.  . vitamin B-12 (CYANOCOBALAMIN) 100 MCG  tablet Take 200 mcg by mouth daily.  . [DISCONTINUED] metFORMIN (GLUCOPHAGE) 1000 MG tablet TAKE 1 TABLET(1000 MG) BY MOUTH TWICE DAILY WITH A MEAL  . [DISCONTINUED] triamcinolone cream (KENALOG) 0.1 % Apply topically 2 (two) times daily. Please avoid face and genitalia area.  Do not use in the same spot for more than 7-10 days in a row.   Facility-Administered Encounter Medications as of 09/15/2016  Medication  . alteplase (CATHFLO ACTIVASE) injection 2 mg  . heparin lock flush 100 unit/mL  . heparin lock flush 100 unit/mL  . sodium chloride 0.9 % injection 10 mL  . sodium chloride 0.9 % injection 3 mL    Review of Systems  Constitutional: Positive for fatigue. Negative for appetite change.  HENT: Negative for congestion and sinus pressure.   Respiratory: Negative for cough, chest tightness and shortness of breath.   Cardiovascular: Negative for chest pain, palpitations and leg swelling.  Gastrointestinal: Negative for nausea and vomiting.       Stable - abdominal pain.   Genitourinary: Negative for difficulty urinating and dysuria.  Musculoskeletal: Negative for joint swelling and myalgias.  Skin: Negative for color change and rash.  Neurological: Negative for dizziness, light-headedness and headaches.  Psychiatric/Behavioral: Negative for agitation and dysphoric mood.       Objective:    Physical Exam  Constitutional: He appears well-developed and well-nourished. No distress.  HENT:  Nose: Nose normal.  Mouth/Throat: Oropharynx is clear and moist.  Neck: Neck supple. No thyromegaly present.  Cardiovascular: Normal rate and regular rhythm.   Pulmonary/Chest: Effort normal and breath sounds normal. No respiratory distress.  Abdominal: Soft. Bowel sounds are normal.  Minimal tenderness to palpation.  Stable.    Musculoskeletal: He exhibits no edema or tenderness.  Lymphadenopathy:    He has no cervical adenopathy.  Skin: No rash noted. No erythema.  Psychiatric: He has a  normal mood and affect. His behavior is normal.    BP (!) 144/74 (BP Location: Left Arm, Patient Position: Sitting, Cuff Size: Normal)   Pulse 86   Temp 98.6 F (37 C) (Oral)   Resp 12   Ht 5' 9"  (1.753 m)   Wt 167 lb 9.6 oz (76 kg)   BMI 24.75 kg/m  Wt Readings from Last 3 Encounters:  09/15/16 167 lb 9.6 oz (76 kg)  08/04/16 169 lb (76.7 kg)  07/04/16 170 lb 9.6 oz (77.4 kg)     Lab Results  Component Value Date   WBC 5.5 08/30/2016   HGB 12.0 (L) 08/30/2016   HCT 34.3 (L) 08/30/2016   PLT 88 (L) 08/30/2016   GLUCOSE 272 (H) 08/30/2016   CHOL 149 04/25/2016   TRIG 273 (H) 04/25/2016   HDL 23 (L) 04/25/2016   LDLDIRECT 56.0 12/17/2015   LDLCALC 71 04/25/2016   ALT 29 08/03/2016   AST 32 08/03/2016   NA 134 (L) 08/30/2016   K 4.8 08/30/2016   CL 103 08/30/2016   CREATININE 1.22 08/30/2016   BUN 28 (H) 08/30/2016   CO2 23 08/30/2016   TSH 6.49 (H) 12/17/2015   INR 1.1 (H) 08/25/2015   HGBA1C 8.7 (H) 06/01/2016   MICROALBUR 22.4 (H) 04/29/2015       Assessment & Plan:   Problem List Items Addressed This Visit    CAD (coronary artery disease)    Stable.        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  Taking lactulose.        Diabetes ([place])    a1c elevated.  Sugars as outlined.  Discussed with him today.  Discussed other treatment options.  He declines more medication.  Desires not to start any new medication.  Follow met b and a1c.        Essential hypertension, benign    Blood pressure under reasonable control.  Same medication regimen.  Follow pressures.  Follow metabolic panel.       Hypercholesterolemia    Follow lipid panel.        Iron deficiency anemia    Continue f/u with hematology.  IV infusions.        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  Multiple myeloma ([place])    Followed by hematology.  Counts being followed.        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  Was on nadolol.  Off now.  Being followed by GI.        Secondary esophageal varices without bleeding (H[facility])    Evaluated by Dr Allen Norris.        Thrombocytopenia ([place])    Followed by hematology.            Einar Pheasant, MD

## 2016-09-15 NOTE — Progress Notes (Signed)
Pre-visit discussion using our clinic review tool. No additional management support is needed unless otherwise documented below in the visit note.  

## 2016-09-17 ENCOUNTER — Encounter: Payer: Self-pay | Admitting: Internal Medicine

## 2016-09-17 NOTE — Assessment & Plan Note (Signed)
a1c elevated.  Sugars as outlined.  Discussed with him today.  Discussed other treatment options.  He declines more medication.  Desires not to start any new medication.  Follow met b and a1c.

## 2016-09-17 NOTE — Assessment & Plan Note (Signed)
Evaluated by Dr Allen Norris.

## 2016-09-17 NOTE — Assessment & Plan Note (Signed)
Follow lipid panel.   

## 2016-09-17 NOTE — Assessment & Plan Note (Signed)
Blood pressure under reasonable control.  Same medication regimen.  Follow pressures.  Follow metabolic panel.   

## 2016-09-17 NOTE — Assessment & Plan Note (Signed)
-

## 2016-09-17 NOTE — Assessment & Plan Note (Signed)
Followed by GI.  Taking lactulose.   

## 2016-09-17 NOTE — Assessment & Plan Note (Addendum)
Followed by hematology.  Counts being followed.

## 2016-09-17 NOTE — Assessment & Plan Note (Signed)
Stable

## 2016-09-17 NOTE — Assessment & Plan Note (Signed)
Has cryptogenic cirrhosis with portal hypertension.  Was on nadolol.  Off now.  Being followed by GI.

## 2016-09-17 NOTE — Assessment & Plan Note (Signed)
Followed by hematology 

## 2016-09-17 NOTE — Assessment & Plan Note (Signed)
Continue f/u with hematology.  (IV infusions).   

## 2016-09-29 ENCOUNTER — Other Ambulatory Visit: Payer: Self-pay | Admitting: Internal Medicine

## 2016-09-30 ENCOUNTER — Encounter: Payer: Self-pay | Admitting: Emergency Medicine

## 2016-09-30 ENCOUNTER — Inpatient Hospital Stay
Admission: EM | Admit: 2016-09-30 | Discharge: 2016-10-03 | DRG: 442 | Disposition: A | Discharge status: Home / Self Care | Payer: PPO | Attending: Internal Medicine | Admitting: Internal Medicine

## 2016-09-30 ENCOUNTER — Emergency Department: Payer: PPO

## 2016-09-30 DIAGNOSIS — D509 Iron deficiency anemia, unspecified: Secondary | ICD-10-CM | POA: Diagnosis present

## 2016-09-30 DIAGNOSIS — K7469 Other cirrhosis of liver: Secondary | ICD-10-CM | POA: Diagnosis not present

## 2016-09-30 DIAGNOSIS — R4182 Altered mental status, unspecified: Secondary | ICD-10-CM

## 2016-09-30 DIAGNOSIS — E7800 Pure hypercholesterolemia, unspecified: Secondary | ICD-10-CM | POA: Diagnosis present

## 2016-09-30 DIAGNOSIS — G4700 Insomnia, unspecified: Secondary | ICD-10-CM | POA: Diagnosis not present

## 2016-09-30 DIAGNOSIS — K7682 Hepatic encephalopathy: Secondary | ICD-10-CM

## 2016-09-30 DIAGNOSIS — D6959 Other secondary thrombocytopenia: Secondary | ICD-10-CM | POA: Diagnosis present

## 2016-09-30 DIAGNOSIS — I252 Old myocardial infarction: Secondary | ICD-10-CM

## 2016-09-30 DIAGNOSIS — Z833 Family history of diabetes mellitus: Secondary | ICD-10-CM | POA: Diagnosis not present

## 2016-09-30 DIAGNOSIS — Z7984 Long term (current) use of oral hypoglycemic drugs: Secondary | ICD-10-CM

## 2016-09-30 DIAGNOSIS — C9000 Multiple myeloma not having achieved remission: Secondary | ICD-10-CM | POA: Diagnosis present

## 2016-09-30 DIAGNOSIS — R410 Disorientation, unspecified: Secondary | ICD-10-CM | POA: Diagnosis not present

## 2016-09-30 DIAGNOSIS — I8500 Esophageal varices without bleeding: Secondary | ICD-10-CM | POA: Diagnosis present

## 2016-09-30 DIAGNOSIS — E611 Iron deficiency: Secondary | ICD-10-CM | POA: Diagnosis present

## 2016-09-30 DIAGNOSIS — I2510 Atherosclerotic heart disease of native coronary artery without angina pectoris: Secondary | ICD-10-CM | POA: Diagnosis not present

## 2016-09-30 DIAGNOSIS — D731 Hypersplenism: Secondary | ICD-10-CM | POA: Diagnosis not present

## 2016-09-30 DIAGNOSIS — R531 Weakness: Secondary | ICD-10-CM | POA: Diagnosis not present

## 2016-09-30 DIAGNOSIS — I10 Essential (primary) hypertension: Secondary | ICD-10-CM | POA: Diagnosis present

## 2016-09-30 DIAGNOSIS — K766 Portal hypertension: Secondary | ICD-10-CM | POA: Diagnosis present

## 2016-09-30 DIAGNOSIS — Z955 Presence of coronary angioplasty implant and graft: Secondary | ICD-10-CM

## 2016-09-30 DIAGNOSIS — Z79899 Other long term (current) drug therapy: Secondary | ICD-10-CM | POA: Diagnosis not present

## 2016-09-30 DIAGNOSIS — E119 Type 2 diabetes mellitus without complications: Secondary | ICD-10-CM | POA: Diagnosis present

## 2016-09-30 DIAGNOSIS — K7291 Hepatic failure, unspecified with coma: Secondary | ICD-10-CM | POA: Diagnosis not present

## 2016-09-30 DIAGNOSIS — N400 Enlarged prostate without lower urinary tract symptoms: Secondary | ICD-10-CM | POA: Diagnosis not present

## 2016-09-30 DIAGNOSIS — K7460 Unspecified cirrhosis of liver: Secondary | ICD-10-CM | POA: Diagnosis not present

## 2016-09-30 DIAGNOSIS — D696 Thrombocytopenia, unspecified: Secondary | ICD-10-CM | POA: Diagnosis not present

## 2016-09-30 DIAGNOSIS — R188 Other ascites: Secondary | ICD-10-CM | POA: Diagnosis not present

## 2016-09-30 DIAGNOSIS — K7290 Hepatic failure, unspecified without coma: Secondary | ICD-10-CM | POA: Diagnosis not present

## 2016-09-30 LAB — COMPREHENSIVE METABOLIC PANEL
ALBUMIN: 3.5 g/dL (ref 3.5–5.0)
ALT: 32 U/L (ref 17–63)
AST: 35 U/L (ref 15–41)
Alkaline Phosphatase: 74 U/L (ref 38–126)
Anion gap: 9 (ref 5–15)
BUN: 25 mg/dL — AB (ref 6–20)
CHLORIDE: 108 mmol/L (ref 101–111)
CO2: 21 mmol/L — AB (ref 22–32)
Calcium: 10.1 mg/dL (ref 8.9–10.3)
Creatinine, Ser: 1.21 mg/dL (ref 0.61–1.24)
GFR calc Af Amer: >60 mL/min (ref >60)
GFR calc non Af Amer: 56 mL/min — ABNORMAL LOW (ref >60)
GLUCOSE: 250 mg/dL — AB (ref 65–99)
POTASSIUM: 4.5 mmol/L (ref 3.5–5.1)
SODIUM: 138 mmol/L (ref 135–145)
Total Bilirubin: 1.7 mg/dL — ABNORMAL HIGH (ref 0.3–1.2)
Total Protein: 8.4 g/dL — ABNORMAL HIGH (ref 6.5–8.1)

## 2016-09-30 LAB — URINALYSIS, ROUTINE W REFLEX MICROSCOPIC
BACTERIA UA: NONE SEEN
BILIRUBIN URINE: NEGATIVE
Glucose, UA: NEGATIVE mg/dL
KETONES UR: 5 mg/dL — AB
LEUKOCYTES UA: NEGATIVE
NITRITE: NEGATIVE
PH: 6 (ref 5.0–8.0)
Protein, ur: 100 mg/dL — AB
Specific Gravity, Urine: 1.015 (ref 1.005–1.030)
Squamous Epithelial / LPF: NONE SEEN

## 2016-09-30 LAB — CBC
HEMATOCRIT: 36.6 % — AB (ref 40.0–52.0)
Hemoglobin: 12.5 g/dL — ABNORMAL LOW (ref 13.0–18.0)
MCH: 29.3 pg (ref 26.0–34.0)
MCHC: 34.3 g/dL (ref 32.0–36.0)
MCV: 85.4 fL (ref 80.0–100.0)
Platelets: 81 10*3/uL — ABNORMAL LOW (ref 150–440)
RBC: 4.28 MIL/uL — ABNORMAL LOW (ref 4.40–5.90)
RDW: 15.9 % — AB (ref 11.5–14.5)
WBC: 5.2 10*3/uL (ref 3.8–10.6)

## 2016-09-30 LAB — AMMONIA: AMMONIA: 103 umol/L — AB (ref 9–35)

## 2016-09-30 LAB — TROPONIN I: Troponin I: <0.03 ng/mL (ref <0.03)

## 2016-09-30 LAB — GLUCOSE, CAPILLARY: Glucose-Capillary: 183 mg/dL — ABNORMAL HIGH (ref 65–99)

## 2016-09-30 MED ORDER — INSULIN ASPART 100 UNIT/ML ~~LOC~~ SOLN
0.0000 [IU] | Freq: Three times a day (TID) | SUBCUTANEOUS | Status: DC
  Administered 2016-10-01: 5 [IU] via SUBCUTANEOUS
  Administered 2016-10-01 – 2016-10-02 (×3): 3 [IU] via SUBCUTANEOUS
  Filled 2016-09-30 (×4): qty 1

## 2016-09-30 MED ORDER — LACTULOSE 10 GM/15ML PO SOLN
20.0000 g | Freq: Four times a day (QID) | ORAL | Status: DC
  Administered 2016-09-30 – 2016-10-03 (×8): 20 g via ORAL
  Filled 2016-09-30 (×9): qty 30

## 2016-09-30 MED ORDER — SODIUM CHLORIDE 0.9 % IV BOLUS (SEPSIS)
1000.0000 mL | Freq: Once | INTRAVENOUS | Status: AC
  Administered 2016-09-30: 1000 mL via INTRAVENOUS

## 2016-09-30 MED ORDER — LORAZEPAM 2 MG/ML IJ SOLN: 0.5000 mg | INTRAMUSCULAR | Status: DC | PRN

## 2016-09-30 MED ORDER — ACETAMINOPHEN 650 MG RE SUPP: 650.0000 mg | Freq: Four times a day (QID) | RECTAL | Status: DC | PRN

## 2016-09-30 MED ORDER — ONDANSETRON HCL 4 MG PO TABS
4.0000 mg | ORAL_TABLET | Freq: Four times a day (QID) | ORAL | Status: DC | PRN
  Administered 2016-10-02: 4 mg via ORAL
  Filled 2016-09-30: qty 1

## 2016-09-30 MED ORDER — VITAMIN B-12 100 MCG PO TABS
200.0000 ug | ORAL_TABLET | Freq: Every day | ORAL | Status: DC
  Administered 2016-10-01 – 2016-10-03 (×3): 200 ug via ORAL
  Filled 2016-09-30 (×3): qty 2

## 2016-09-30 MED ORDER — HEPARIN SODIUM (PORCINE) 5000 UNIT/ML IJ SOLN
5000.0000 [IU] | Freq: Three times a day (TID) | INTRAMUSCULAR | Status: DC
  Administered 2016-09-30 – 2016-10-03 (×8): 5000 [IU] via SUBCUTANEOUS
  Filled 2016-09-30 (×8): qty 1

## 2016-09-30 MED ORDER — LISINOPRIL 20 MG PO TABS
20.0000 mg | ORAL_TABLET | Freq: Every day | ORAL | Status: DC
  Administered 2016-10-01 – 2016-10-03 (×3): 20 mg via ORAL
  Filled 2016-09-30 (×3): qty 1

## 2016-09-30 MED ORDER — SPIRONOLACTONE 25 MG PO TABS
50.0000 mg | ORAL_TABLET | Freq: Every day | ORAL | Status: DC
  Administered 2016-10-01 – 2016-10-03 (×3): 50 mg via ORAL
  Filled 2016-09-30 (×3): qty 2

## 2016-09-30 MED ORDER — BISACODYL 10 MG RE SUPP: 10.0000 mg | Freq: Every day | RECTAL | Status: DC | PRN

## 2016-09-30 MED ORDER — LACTULOSE 10 GM/15ML PO SOLN: 20.0000 g | Freq: Three times a day (TID) | ORAL | Status: DC

## 2016-09-30 MED ORDER — ACETAMINOPHEN 325 MG PO TABS: 650.0000 mg | ORAL_TABLET | Freq: Four times a day (QID) | ORAL | Status: DC | PRN

## 2016-09-30 MED ORDER — HYDRALAZINE HCL 20 MG/ML IJ SOLN: 10.0000 mg | Freq: Four times a day (QID) | INTRAMUSCULAR | Status: DC | PRN

## 2016-09-30 MED ORDER — SODIUM CHLORIDE 0.9 % IV SOLN
INTRAVENOUS | Status: DC
  Administered 2016-09-30 – 2016-10-02 (×4): via INTRAVENOUS

## 2016-09-30 MED ORDER — GLIPIZIDE 10 MG PO TABS
10.0000 mg | ORAL_TABLET | Freq: Two times a day (BID) | ORAL | Status: DC
  Filled 2016-09-30 (×3): qty 1

## 2016-09-30 MED ORDER — METOPROLOL TARTRATE 25 MG PO TABS
25.0000 mg | ORAL_TABLET | Freq: Two times a day (BID) | ORAL | Status: DC
  Administered 2016-09-30 – 2016-10-03 (×6): 25 mg via ORAL
  Filled 2016-09-30 (×6): qty 1

## 2016-09-30 MED ORDER — ONDANSETRON HCL 4 MG/2ML IJ SOLN: 4.0000 mg | Freq: Four times a day (QID) | INTRAMUSCULAR | Status: DC | PRN

## 2016-09-30 MED ORDER — PANTOPRAZOLE SODIUM 40 MG IV SOLR
40.0000 mg | Freq: Two times a day (BID) | INTRAVENOUS | Status: DC
  Administered 2016-09-30 – 2016-10-01 (×3): 40 mg via INTRAVENOUS
  Filled 2016-09-30 (×3): qty 40

## 2016-09-30 MED ORDER — VITAMIN C 500 MG PO TABS
1000.0000 mg | ORAL_TABLET | Freq: Every day | ORAL | Status: DC
  Administered 2016-10-01 – 2016-10-03 (×3): 1000 mg via ORAL
  Filled 2016-09-30 (×3): qty 2

## 2016-09-30 MED ORDER — RIFAXIMIN 550 MG PO TABS
550.0000 mg | ORAL_TABLET | Freq: Two times a day (BID) | ORAL | Status: DC
  Administered 2016-09-30 – 2016-10-01 (×2): 550 mg via ORAL
  Filled 2016-09-30 (×2): qty 1

## 2016-09-30 MED ORDER — METFORMIN HCL 500 MG PO TABS
500.0000 mg | ORAL_TABLET | Freq: Two times a day (BID) | ORAL | Status: DC
  Filled 2016-09-30 (×3): qty 1

## 2016-09-30 MED ORDER — THEANINE 100 MG PO CAPS: ORAL_CAPSULE | Freq: Every day | ORAL | Status: DC

## 2016-09-30 MED ORDER — LACTULOSE 10 GM/15ML PO SOLN
10.0000 g | Freq: Once | ORAL | Status: DC
  Filled 2016-09-30: qty 30

## 2016-09-30 MED ORDER — DOCUSATE SODIUM 100 MG PO CAPS
100.0000 mg | ORAL_CAPSULE | Freq: Two times a day (BID) | ORAL | Status: DC
  Administered 2016-10-02 (×2): 100 mg via ORAL
  Filled 2016-09-30 (×3): qty 1

## 2016-09-30 NOTE — H&P (Signed)
History and Physical    [name] HWT:[id number] DOB: Jan 05, 1941 DOA: 09/30/2016  Referring physician: Dr. Jacqualine Code PCP: Einar Pheasant, MD  Specialists: Dr. Mike Gip  Chief Complaint: confusion and weakness  HPI: Zachary Buck is a 76 y.o. male has a past medical history significant for CAD s/p MI, DM, HTN, multiple myeloma, IDA, and cirrhosis now with 1-2 day hx of progressive weakness and confusion. In ER, head CT Ok but ammonia level >100. He is now admitted. No fever. Denies CP or SOB. No N/V/D.  Review of Systems: The patient denies anorexia, fever, weight loss,, vision loss, decreased hearing, hoarseness, chest pain, syncope, dyspnea on exertion, peripheral edema, balance deficits, hemoptysis, abdominal pain, melena, hematochezia, severe indigestion/heartburn, hematuria, incontinence, genital sores, suspicious skin lesions, transient blindness, difficulty walking, depression, unusual weight change, abnormal bleeding, enlarged lymph nodes, angioedema, and breast masses.   Past Medical History:  Diagnosis Date  . Anemia   . Atherosclerotic heart disease    s/p MI, s/p stent mid circumflex  . CAD (coronary artery disease)    Dr Nehemiah Massed, cardiologist  . Cirrhosis of liver ([place])   . Clavicle fracture   . Diabetes mellitus without complication ([place])    oral med  . Elevated blood sugar    02/03/16 and 02/09/16 had BS over 400.  otherwise running around 250  . Esophageal varices ([place])   . Hepatic encephalopathy ([place])   . Hypercholesterolemia   . Hypertension    controlled on meds  . Internal hemorrhoids   . Leg fracture   . Multiple myeloma ([place])   . Myocardial infarct ([place])   . Prostate enlargement   . Tubular adenoma of colon    Past Surgical History:  Procedure Laterality Date  . COLONOSCOPY WITH PROPOFOL N/A 03/04/2015   Procedure: COLONOSCOPY WITH PROPOFOL;  Surgeon: Jerene Bears, MD;  [facility];  Service: Gastroenterology;   Laterality: N/A;  . COLONOSCOPY WITH PROPOFOL N/A 12/09/2015   Procedure: COLONOSCOPY WITH PROPOFOL;  Surgeon: [name], MD;  Location: [place];  Service: Endoscopy;  Laterality: N/A;  DIABETIC-ORAL MEDS  . CORONARY ANGIOPLASTY WITH STENT PLACEMENT     2 stents  . ESOPHAGOGASTRODUODENOSCOPY (EGD) WITH PROPOFOL N/A 03/04/2015   Procedure: ESOPHAGOGASTRODUODENOSCOPY (EGD) WITH PROPOFOL;  Surgeon: Jerene Bears, MD;  [facility];  Service: Gastroenterology;  Laterality: N/A;  . ESOPHAGOGASTRODUODENOSCOPY (EGD) WITH PROPOFOL N/A 12/09/2015   Procedure: ESOPHAGOGASTRODUODENOSCOPY (EGD) WITH PROPOFOL;  Surgeon: [name], MD;  Location: [place];  Service: Endoscopy;  Laterality: N/A;  . ESOPHAGOGASTRODUODENOSCOPY (EGD) WITH PROPOFOL N/A 12/21/2015   Procedure: ESOPHAGOGASTRODUODENOSCOPY (EGD) WITH PROPOFOL;  Surgeon: [name], MD;  Location: [facility];  Service: Endoscopy;  Laterality: N/A;  . ESOPHAGOGASTRODUODENOSCOPY (EGD) WITH PROPOFOL N/A 02/24/2016   Procedure: ESOPHAGOGASTRODUODENOSCOPY (EGD) WITH PROPOFOL;  Surgeon: [name], MD;  Location: [place];  Service: Endoscopy;  Laterality: N/A;  diabetic - oral med  . POLYPECTOMY N/A 12/09/2015   Procedure: POLYPECTOMY;  Surgeon: [name], MD;  Location: [place];  Service: Endoscopy;  Laterality: N/A;  . TONSILLECTOMY     Social History:  reports that he has never smoked. He has never used smokeless tobacco. He reports that he does not drink alcohol or use drugs.  Allergies  Allergen Reactions  . Niaspan [Niacin Er] Other (See Comments)    Just does not want to take     Family History  Problem Relation Age of Onset  . Aneurysm Father 56  of the heart  . Heart attack Father   . Diabetes Mother     Prior to Admission medications   Medication Sig Start Date End Date Taking? Authorizing Provider  Alpha-Lipoic Acid (LIPOIC ACID PO) Take 200 mg by mouth daily.      [provider]  Ascorbic Acid (VITAMIN C) 1000 MG tablet Take 1,000 mg by mouth daily.    [provider]  B Complex Vitamins (VITAMIN B COMPLEX) TABS Take by mouth daily.    [provider]  blood glucose meter kit and supplies KIT Dispense based on patient and insurance preference. Use up to four times daily as directed. E11.9 Please dispense Freestyle freedom meter and supplies. 09/09/15   Coral Spikes, DO  Cholecalciferol (VITAMIN D-3) 1000 UNITS CAPS Take by mouth.    [provider]  CHROMIUM PO Take 600 mg by mouth daily.     [provider]  Cinnamon 500 MG capsule Take 500 mg by mouth.    [provider]  Coenzyme Q10 100 MG capsule Take by mouth.    [provider]  furosemide (LASIX) 20 MG tablet Take 1 tablet (20 mg total) by mouth 2 (two) times daily. 01/03/16   [name], MD  GENERLAC 10 GM/15ML SOLN TK 15 ML PO BID. TITRATE TO 2 SOFT STOOLS PER DAY. DECREASE IF YOU START HAVING DIARRHEA 03/13/16   [provider]  Ginger, Zingiber officinalis, (GINGER ROOT) 550 MG CAPS Take by mouth daily.    [provider]  glipiZIDE (GLUCOTROL) 5 MG tablet TAKE 1 TABLET BY MOUTH EVERY MORNING AND 2 TABLETS BY MOUTH EVERY EVENING 07/03/16   Einar Pheasant, MD  glucose blood (ONE TOUCH ULTRA TEST) test strip Use as instructed 11/18/15   Einar Pheasant, MD  lactulose (CHRONULAC) 10 GM/15ML solution Take 15 mLs (10 g total) by mouth 2 (two) times daily. Titrate to 2 soft stools per day. Decrease if you start having diarrhea 03/13/16   [name], MD  Lancets [facility] ULTRASOFT) lancets Use as instructed 03/16/14   Einar Pheasant, MD  lisinopril (PRINIVIL,ZESTRIL) 20 MG tablet TAKE ONE TABLET BY MOUTH EVERY DAY/ AM 01/11/15   [provider]  Magnesium 500 MG CAPS Take 500 mg by mouth daily.    [provider]  metFORMIN (GLUCOPHAGE) 1000 MG tablet TAKE 1 TABLET BY MOUTH TWICE A DAY WITH A MEAL  09/29/16   Einar Pheasant, MD  Omega-3 Fatty Acids (FISH OIL) 1000 MG CAPS Take 4,000 mg by mouth daily.     [provider]  [place]    [provider]  OVER THE COUNTER MEDICATION 250 MG CITICOLINE    [provider]  rifaximin (XIFAXAN) 550 MG TABS tablet Take 1 tablet (550 mg total) by mouth 2 (two) times daily. 07/31/16   [name], MD  SBI/Protein Isolate [facility]) 5 g PACK Take 1 packet by mouth daily. 07/26/15   Lequita Asal, MD  sildenafil (REVATIO) 20 MG tablet 3 to 5 tablets per day or as instructed 09/01/15   [provider]  spironolactone (ALDACTONE) 50 MG tablet Take 1 tablet (50 mg total) by mouth daily. 01/03/16   [name], MD  Theanine 100 MG CAPS Take by mouth daily.    [provider]  triamcinolone cream (KENALOG) 0.1 % Apply topically 2 (two) times daily. Please avoid face and genitalia area.  Do not use in the same spot for more than 7-10 days  in a row. Pharmacy changed to [facility]. 08/31/16   Einar Pheasant, MD  Turmeric POWD 4,000 mg by Does not apply route daily.    [provider]  vitamin B-12 (CYANOCOBALAMIN) 100 MCG tablet Take 200 mcg by mouth daily.    [provider]   Physical Exam: Vitals:   09/30/16 1430 09/30/16 1500 09/30/16 1530 09/30/16 1604  BP: (!) 167/90 (!) 195/89  (!) 166/87  Pulse: 86  78 78  Resp: _0 Temp:      TempSrc:      SpO2: 97%  98% 94%  Weight:      Height:         General:  No apparent distress, WDWN, [place]/AT  Eyes: PERRL, EOMI, no scleral icterus, conjunctiva clear  ENT: moist oropharynx without exudate, TM's benign, dentition fair  Neck: supple, no lymphadenopathy. No bruits or thyromegaly  Cardiovascular: regular rate without MRG; 2+ peripheral pulses, no JVD, no peripheral edema  Respiratory: CTA biL, good air movement without wheezing, rhonchi or crackled. Respiratory effort normal  Abdomen: soft, non  tender to palpation, positive bowel sounds, no guarding, no rebound  Skin: no rashes or lesions  Musculoskeletal: normal bulk and tone, no joint swelling  Psychiatric: normal mood and affect, A&OX2(not to time)  Neurologic: CN 2-12 grossly intact, Motor strength 5/5 in all 4 groups with symmetric DTR's and non-focal sensory exam  Labs on Admission:  Basic Metabolic Panel:  Recent Labs Lab 09/30/16 1427  NA 138  K 4.5  CL 108  CO2 21*  GLUCOSE 250*  BUN 25*  CREATININE 1.21  CALCIUM 10.1   Liver Function Tests:  Recent Labs Lab 09/30/16 1427  AST 35  ALT 32  ALKPHOS 74  BILITOT 1.7*  PROT 8.4*  ALBUMIN 3.5   No results for input(s): LIPASE, AMYLASE in the last 168 hours.  Recent Labs Lab 09/30/16 1559  AMMONIA 103*   CBC:  Recent Labs Lab 09/30/16 1427  WBC 5.2  HGB 12.5*  HCT 36.6*  MCV 85.4  PLT 81*   Cardiac Enzymes:  Recent Labs Lab 09/30/16 1427  TROPONINI <0.03    BNP (last 3 results) No results for input(s): BNP in the last 8760 hours.  ProBNP (last 3 results) No results for input(s): PROBNP in the last 8760 hours.  CBG: No results for input(s): GLUCAP in the last 168 hours.  Radiological Exams on Admission: Dg Chest 2 View  Result Date: 09/30/2016 CLINICAL DATA:  Altered mental status, history coronary artery disease post MI and stenting, diabetes mellitus, hypertension EXAM: CHEST  2 VIEW COMPARISON:  07/09/2013 FINDINGS: Upper normal heart size. Mediastinal contours and pulmonary vascularity normal. Atherosclerotic calcification aorta. Mild bronchitic changes without infiltrate, pleural effusion or pneumothorax. Suspected BILATERAL nipple shadows. Scattered endplate spur formation thoracic spine and mild osseous demineralization. IMPRESSION: Bronchitic changes without infiltrate. Question BILATERAL nipple shadows ; repeat PA chest radiograph with nipple markers recommended to exclude pulmonary nodule. Electronically Signed   By: Lavonia Dana M.D.   On: 09/30/2016 15:35   Ct Head Wo Contrast  Result Date: 09/30/2016 CLINICAL DATA:  Confusion, disorientation, difficulty finding words, history cirrhosis, multiple myeloma, diabetes mellitus, coronary artery disease post MI and stenting, hypertension EXAM: CT HEAD WITHOUT CONTRAST TECHNIQUE: Contiguous axial images were obtained from the base of the skull through the vertex without intravenous contrast. Sagittal and coronal MPR images reconstructed from axial data set. COMPARISON:  None FINDINGS: Brain: Mild generalized atrophy. Normal ventricular morphology.  No midline shift or mass effect. Otherwise normal appearance of brain parenchyma. No intracranial hemorrhage, mass lesion, or evidence acute infarction. No extra-axial fluid collections. Vascular: Atherosclerotic calcifications at skullbase Skull: Appears demineralized but intact Sinuses/Orbits: Clear Other: N/A IMPRESSION: No acute intracranial abnormalities. Electronically Signed   By: Lavonia Dana M.D.   On: 09/30/2016 15:41    EKG: Independently reviewed.  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 Consult Gi and follow ammonia levels. MRI of brain to r/o other causes of confusion. Follow sugars and use SSI as needed. Repeat labs in AM. Consult PT and CM  Diet: clear liquids Fluids: NS_0  DVT Prophylaxis: SQ Heparin  Code Status: FULl  Family Communication: yes  Disposition Plan: home  Time spent: 55 min

## 2016-09-30 NOTE — ED Provider Notes (Signed)
[facility] Emergency Department Provider Note  Time seen: 2:54 PM  I have reviewed the triage vital signs and the nursing notes.   HISTORY  Chief Complaint Altered Mental Status    HPI Zachary Buck is a 76 y.o. 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 According to the wife yesterday she states the patient was not feeling very well went to bed early around 8 PM. She tried to get the patient up this morning at his normal time however the patient says he was too tired and wanted to go back to sleep. She states later this morning the patient finally awoke however. Very disoriented. He was walking into the wrong rooms. Was having word finding difficulty saying the wrong words to his wife. Wife states the patient has a history of nonalcohol induced cirrhosis, currently takes lactulose but has not missed any doses. Wife states he has never acted like this before. Here the patient is calm, cooperative, pleasant but is disoriented do things like date, does not know why he is here.  Past Medical History:  Diagnosis Date  . Anemia   . Atherosclerotic heart disease    s/p MI, s/p stent mid circumflex  . CAD (coronary artery disease)    Dr Nehemiah Massed, cardiologist  . Cirrhosis of liver ([place])   . Clavicle fracture   . Diabetes mellitus without complication ([place])    oral med  . Elevated blood sugar    02/03/16 and 02/09/16 had BS over 400.  otherwise running around 250  . Esophageal varices ([place])   . Hepatic encephalopathy ([place])   . Hypercholesterolemia   . Hypertension    controlled on meds  . Internal hemorrhoids   . Leg fracture   . Multiple myeloma ([place])   . Myocardial infarct ([place])   . Prostate enlargement   . Tubular adenoma of colon     Patient Active Problem List   Diagnosis Date Noted  . Fatigue 04/25/2016  . Iron  deficiency anemia due to chronic blood loss   . Dizziness 02/15/2016  . Melena   . 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 Benign neoplasm of ascending colon   . Polyp of sigmoid colon   . 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 Incomplete emptying of bladder 09/01/2015  . 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 IDA (iron deficiency anemia)   . Heme positive stool   . Gastric polyp   . Angiodysplasia of duodenum   . Secondary esophageal varices without bleeding ([place])   . Portal hypertension ([place])   . Iron deficiency anemia 12/10/2014  . Abnormal PET of right lung 12/07/2014  . Multiple myeloma ([place]) 11/10/2014  . 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 Thrombocytopenia ([place]) 10/09/2014  . Awareness of heartbeats 09/18/2014  . Nasal lesion 08/26/2014  . Rash 08/26/2014  . Breathlessness on exertion 06/24/2014  . Combined fat and carbohydrate induced hyperlipemia 06/12/2014  . Benign essential HTN 06/11/2014  . Throat congestion 03/08/2014  . Abdominal pain 10/15/2013  . Arm mass 10/15/2013  . Mononeuropathy of upper extremity 09/27/2013  . Neuropathy, upper extremity 09/27/2013  . Elevated prostate specific antigen (PSA) 08/15/2013  . Diarrhea 07/13/2013  . D (diarrhea) 07/13/2013  . Abnormal chest CT 06/22/2013  . Bilateral arm pain 10/11/2012  . 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  Benign prostatic hyperplasia with urinary obstruction 07/23/2012  . Calculus of kidney 07/11/2012  . Calculi, ureter 07/11/2012  . Hydronephrosis 07/11/2012  . Renal colic 00/93/8182  . Diabetes ([place]) 05/29/2012  . Hypercholesterolemia 05/21/2012  . History of colonic polyps 05/21/2012  . CAD (coronary artery disease) 05/21/2012  . Anemia 05/21/2012  . Essential hypertension, benign 05/21/2012  . Absolute anemia 05/21/2012    Past Surgical History:  Procedure Laterality Date  . COLONOSCOPY WITH  PROPOFOL N/A 03/04/2015   Procedure: COLONOSCOPY WITH PROPOFOL;  Surgeon: Jerene Bears, MD;  Location: [facility];  Service: Gastroenterology;  Laterality: N/A;  . COLONOSCOPY WITH PROPOFOL N/A 12/09/2015   Procedure: COLONOSCOPY WITH PROPOFOL;  Surgeon: [name], MD;  Location: [facility];  Service: Endoscopy;  Laterality: N/A;  DIABETIC-ORAL MEDS  . CORONARY ANGIOPLASTY WITH STENT PLACEMENT     2 stents  . ESOPHAGOGASTRODUODENOSCOPY (EGD) WITH PROPOFOL N/A 03/04/2015   Procedure: ESOPHAGOGASTRODUODENOSCOPY (EGD) WITH PROPOFOL;  Surgeon: Jerene Bears, MD;  Location: [facility];  Service: Gastroenterology;  Laterality: N/A;  . ESOPHAGOGASTRODUODENOSCOPY (EGD) WITH PROPOFOL N/A 12/09/2015   Procedure: ESOPHAGOGASTRODUODENOSCOPY (EGD) WITH PROPOFOL;  Surgeon: [name], MD;  Location: [facility];  Service: Endoscopy;  Laterality: N/A;  . ESOPHAGOGASTRODUODENOSCOPY (EGD) WITH PROPOFOL N/A 12/21/2015   Procedure: ESOPHAGOGASTRODUODENOSCOPY (EGD) WITH PROPOFOL;  Surgeon: [name], MD;  Location: [facility];  Service: Endoscopy;  Laterality: N/A;  . ESOPHAGOGASTRODUODENOSCOPY (EGD) WITH PROPOFOL N/A 02/24/2016   Procedure: ESOPHAGOGASTRODUODENOSCOPY (EGD) WITH PROPOFOL;  Surgeon: [name], MD;  Location: [facility];  Service: Endoscopy;  Laterality: N/A;  diabetic - oral med  . POLYPECTOMY N/A 12/09/2015   Procedure: POLYPECTOMY;  Surgeon: [name], MD;  Location: [facility];  Service: Endoscopy;  Laterality: N/A;  . TONSILLECTOMY      Prior to Admission medications   Medication Sig Start Date End Date Taking? Authorizing Provider  Alpha-Lipoic Acid (LIPOIC ACID PO) Take 200 mg by mouth daily.     [provider]  Ascorbic Acid (VITAMIN C) 1000 MG tablet Take 1,000 mg by mouth daily.    [provider]  B Complex Vitamins (VITAMIN B COMPLEX) TABS Take by mouth daily.    [provider]  blood glucose meter kit and  supplies KIT Dispense based on patient and insurance preference. Use up to four times daily as directed. E11.9 Please dispense Freestyle freedom meter and supplies. 09/09/15   Coral Spikes, DO  Cholecalciferol (VITAMIN D-3) 1000 UNITS CAPS Take by mouth.    [provider]  CHROMIUM PO Take 600 mg by mouth daily.     [provider]  Cinnamon 500 MG capsule Take 500 mg by mouth.    [provider]  Coenzyme Q10 100 MG capsule Take by mouth.    [provider]  furosemide (LASIX) 20 MG tablet Take 1 tablet (20 mg total) by mouth 2 (two) times daily. 01/03/16   [name], MD  GENERLAC 10 GM/15ML SOLN TK 15 ML PO BID. TITRATE TO 2 SOFT STOOLS PER DAY. DECREASE IF YOU START HAVING DIARRHEA 03/13/16   [provider]  Ginger, Zingiber officinalis, (GINGER ROOT) 550 MG CAPS Take by mouth daily.    [provider]  glipiZIDE (GLUCOTROL) 5 MG tablet TAKE 1 TABLET BY MOUTH EVERY MORNING AND 2 TABLETS BY MOUTH EVERY EVENING 07/03/16   Einar Pheasant, MD  glucose blood (ONE TOUCH ULTRA TEST) test strip Use as instructed 11/18/15   Einar Pheasant,  MD  lactulose (CHRONULAC) 10 GM/15ML solution Take 15 mLs (10 g total) by mouth 2 (two) times daily. Titrate to 2 soft stools per day. Decrease if you start having diarrhea 03/13/16   [name], MD  Lancets [facility] ULTRASOFT) lancets Use as instructed 03/16/14   Einar Pheasant, MD  lisinopril (PRINIVIL,ZESTRIL) 20 MG tablet TAKE ONE TABLET BY MOUTH EVERY DAY/ AM 01/11/15   [provider]  Magnesium 500 MG CAPS Take 500 mg by mouth daily.    [provider]  metFORMIN (GLUCOPHAGE) 1000 MG tablet TAKE 1 TABLET BY MOUTH TWICE A DAY WITH A MEAL 09/29/16   Einar Pheasant, MD  Omega-3 Fatty Acids (FISH OIL) 1000 MG CAPS Take 4,000 mg by mouth daily.     [provider]  [place]    [provider]  OVER THE COUNTER MEDICATION 250 MG CITICOLINE     [provider]  rifaximin (XIFAXAN) 550 MG TABS tablet Take 1 tablet (550 mg total) by mouth 2 (two) times daily. 07/31/16   [name], MD  SBI/Protein Isolate [facility]) 5 g PACK Take 1 packet by mouth daily. 07/26/15   Lequita Asal, MD  sildenafil (REVATIO) 20 MG tablet 3 to 5 tablets per day or as instructed 09/01/15   [provider]  spironolactone (ALDACTONE) 50 MG tablet Take 1 tablet (50 mg total) by mouth daily. 01/03/16   [name], MD  Theanine 100 MG CAPS Take by mouth daily.    [provider]  triamcinolone cream (KENALOG) 0.1 % Apply topically 2 (two) times daily. Please avoid face and genitalia area.  Do not use in the same spot for more than 7-10 days in a row. Pharmacy changed to [facility]. 08/31/16   Einar Pheasant, MD  Turmeric POWD 4,000 mg by Does not apply route daily.    [provider]  vitamin B-12 (CYANOCOBALAMIN) 100 MCG tablet Take 200 mcg by mouth daily.    [provider]    Allergies  Allergen Reactions  . Niaspan [Niacin Er] Other (See Comments)    Just does not want to take     Family History  Problem Relation Age of Onset  . Aneurysm Father 96       of the heart  . Heart attack Father   . Diabetes Mother     Social History Social History  Substance Use Topics  . Smoking status: Never Smoker  . Smokeless tobacco: Never Used  . Alcohol use No    Review of Systems Constitutional: Negative for fever. Cardiovascular: Negative for chest pain. Respiratory: Negative for shortness of breath. Gastrointestinal: Negative for abdominal pain, vomiting and diarrhea. Genitourinary: Negative for dysuria. Neurological: Mild headache. Denies focal weakness or numbness. All other ROS negative  ____________________________________________   PHYSICAL EXAM:  VITAL SIGNS: ED Triage Vitals  Enc Vitals Group     BP 09/30/16 1422 (!) 181/90     Pulse Rate 09/30/16 1422 78     Resp 09/30/16 1422 19      Temp 09/30/16 1422 97.6 F (36.4 C)     Temp Source 09/30/16 1422 Oral     SpO2 09/30/16 1415 96 %     Weight 09/30/16 1417 160 lb (72.6 kg)     Height 09/30/16 1417 5' 9" (1.753 m)     Head Circumference --      Peak Flow --      Pain Score --      Pain  Loc --      Pain Edu? --      Excl. in GC? --     Constitutional: Alert and oriented3 but not to date. Well appearing and in no distress. Eyes: Normal exam ENT   Head: Normocephalic and atraumatic.   Mouth/Throat: Mucous membranes are moist. Cardiovascular: Normal rate, regular rhythm. No murmur Respiratory: Normal respiratory effort without tachypnea nor retractions. Breath sounds are clear  Gastrointestinal: Soft and nontender. No distention.   Musculoskeletal: Nontender with normal range of motion in all extremities.  Neurologic:  Normal speech and language. No gross focal neurologic deficits. 5/5 motor in all extremities. No cranial nerve deficits. No pronator drift. Skin:  Skin is warm, dry and intact.  Psychiatric: Mood and affect are normal.   ____________________________________________    EKG  EKG reviewed and interpreted by myself shows normal sinus rhythm at 80 bpm, narrow QRS, left axis deviation, normal intervals, nonspecific ST changes without elevation.  ____________________________________________    RADIOLOGY  CT pending Chest X ray pending ____________________________________________   INITIAL IMPRESSION / ASSESSMENT AND PLAN / ED COURSE  Pertinent labs & imaging results that were available during my care of the patient were reviewed by me and considered in my medical decision making (see chart for details).  Patient presents to the emergency department for altered mental status. Wife states he was acting somewhat sluggish and somnolent last night went to bed early, has been very confused all day today. Patient is oriented 3 but not to date.  Patient has a intact neurological exam, no  weakness or numbness identified. 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 We will check labs including LFTs and an ammonia. We will obtain a CT scan of the head and a urinalysis.  Imaging and labs are pending at this time. Patient care signed out to oncoming physician.  ____________________________________________   FINAL CLINICAL IMPRESSION(S) / ED DIAGNOSES  Confusion Altered mental status    Harvest Dark, MD 09/30/16 1458

## 2016-09-30 NOTE — ED Notes (Signed)
Patient transported to X-ray 

## 2016-09-30 NOTE — ED Provider Notes (Signed)
Dg Chest 2 View  Result Date: 09/30/2016 CLINICAL DATA:  Altered mental status, history coronary artery disease post MI and stenting, diabetes mellitus, hypertension EXAM: CHEST  2 VIEW COMPARISON:  07/09/2013 FINDINGS: Upper normal heart size. Mediastinal contours and pulmonary vascularity normal. Atherosclerotic calcification aorta. Mild bronchitic changes without infiltrate, pleural effusion or pneumothorax. Suspected BILATERAL nipple shadows. Scattered endplate spur formation thoracic spine and mild osseous demineralization. IMPRESSION: Bronchitic changes without infiltrate. Question BILATERAL nipple shadows ; repeat PA chest radiograph with nipple markers recommended to exclude pulmonary nodule. Electronically Signed   By: Lavonia Dana M.D.   On: 09/30/2016 15:35   Ct Head Wo Contrast  Result Date: 09/30/2016 CLINICAL DATA:  Confusion, disorientation, difficulty finding words, history cirrhosis, multiple myeloma, diabetes mellitus, coronary artery disease post MI and stenting, hypertension EXAM: CT HEAD WITHOUT CONTRAST TECHNIQUE: Contiguous axial images were obtained from the base of the skull through the vertex without intravenous contrast. Sagittal and coronal MPR images reconstructed from axial data set. COMPARISON:  None FINDINGS: Brain: Mild generalized atrophy. Normal ventricular morphology. No midline shift or mass effect. Otherwise normal appearance of brain parenchyma. No intracranial hemorrhage, mass lesion, or evidence acute infarction. No extra-axial fluid collections. Vascular: Atherosclerotic calcifications at skullbase Skull: Appears demineralized but intact Sinuses/Orbits: Clear Other: N/A IMPRESSION: No acute intracranial abnormalities. Electronically Signed   By: Lavonia Dana M.D.   On: 09/30/2016 15:41   Ammonia elevated. Reexam, patient is slightly confused but alert. No distress. He is confused as to why he is at the hospital, family report he is been compliant with  lactulose.  ----------------------------------------- 5:48 PM on 09/30/2016 -----------------------------------------  Elevated ammonia. Appears consistent with hepatic encephalopathy. We'll admit for further workup.    Delman Kitten, MD 09/30/16 [phone]

## 2016-09-30 NOTE — ED Notes (Signed)
Pt given 10mg  Lactulose from patient's home medication.

## 2016-09-30 NOTE — ED Notes (Signed)
Patient assisted to bathroom by Fara Chute, RN.

## 2016-09-30 NOTE — ED Triage Notes (Signed)
Pt to ED by EMS. Family called stating that the pt had AMS. Per EMS pt was unable to answer questions. Pt A&O at present.

## 2016-09-30 NOTE — ED Notes (Signed)
Assisted patient to bathroom.  Urine sample collected and sent to lab.Marland Kitchen

## 2016-10-01 DIAGNOSIS — E119 Type 2 diabetes mellitus without complications: Secondary | ICD-10-CM

## 2016-10-01 DIAGNOSIS — D509 Iron deficiency anemia, unspecified: Secondary | ICD-10-CM

## 2016-10-01 DIAGNOSIS — C9000 Multiple myeloma not having achieved remission: Secondary | ICD-10-CM

## 2016-10-01 DIAGNOSIS — I10 Essential (primary) hypertension: Secondary | ICD-10-CM

## 2016-10-01 DIAGNOSIS — R4182 Altered mental status, unspecified: Secondary | ICD-10-CM

## 2016-10-01 DIAGNOSIS — E7800 Pure hypercholesterolemia, unspecified: Secondary | ICD-10-CM

## 2016-10-01 DIAGNOSIS — Z79899 Other long term (current) drug therapy: Secondary | ICD-10-CM

## 2016-10-01 DIAGNOSIS — D696 Thrombocytopenia, unspecified: Secondary | ICD-10-CM

## 2016-10-01 DIAGNOSIS — K7469 Other cirrhosis of liver: Secondary | ICD-10-CM

## 2016-10-01 LAB — COMPREHENSIVE METABOLIC PANEL
ALK PHOS: 70 U/L (ref 38–126)
ALT: 32 U/L (ref 17–63)
AST: 34 U/L (ref 15–41)
Albumin: 3.1 g/dL — ABNORMAL LOW (ref 3.5–5.0)
Anion gap: 8 (ref 5–15)
BILIRUBIN TOTAL: 2 mg/dL — AB (ref 0.3–1.2)
BUN: 21 mg/dL — AB (ref 6–20)
CO2: 21 mmol/L — ABNORMAL LOW (ref 22–32)
Calcium: 9.6 mg/dL (ref 8.9–10.3)
Chloride: 111 mmol/L (ref 101–111)
Creatinine, Ser: 1.08 mg/dL (ref 0.61–1.24)
GFR calc Af Amer: >60 mL/min (ref >60)
Glucose, Bld: 234 mg/dL — ABNORMAL HIGH (ref 65–99)
Potassium: 4.1 mmol/L (ref 3.5–5.1)
Sodium: 140 mmol/L (ref 135–145)
Total Protein: 7.8 g/dL (ref 6.5–8.1)

## 2016-10-01 LAB — GLUCOSE, CAPILLARY
GLUCOSE-CAPILLARY: 209 mg/dL — AB (ref 65–99)
GLUCOSE-CAPILLARY: 280 mg/dL — AB (ref 65–99)
Glucose-Capillary: 294 mg/dL — ABNORMAL HIGH (ref 65–99)
Glucose-Capillary: 250 mg/dL — ABNORMAL HIGH (ref 65–99)

## 2016-10-01 LAB — CBC
HCT: 34.7 % — ABNORMAL LOW (ref 40.0–52.0)
Hemoglobin: 12 g/dL — ABNORMAL LOW (ref 13.0–18.0)
MCH: 30 pg (ref 26.0–34.0)
MCHC: 34.6 g/dL (ref 32.0–36.0)
MCV: 86.8 fL (ref 80.0–100.0)
PLATELETS: 77 10*3/uL — AB (ref 150–440)
RBC: 4 MIL/uL — ABNORMAL LOW (ref 4.40–5.90)
RDW: 16.1 % — AB (ref 11.5–14.5)
WBC: 5.2 10*3/uL (ref 3.8–10.6)

## 2016-10-01 LAB — AMMONIA: Ammonia: 72 umol/L — ABNORMAL HIGH (ref 9–35)

## 2016-10-01 LAB — PROTIME-INR
INR: 1.12
Prothrombin Time: 14.5 seconds (ref 11.4–15.2)

## 2016-10-01 MED ORDER — RIFAXIMIN 550 MG PO TABS
550.0000 mg | ORAL_TABLET | Freq: Two times a day (BID) | ORAL | Status: DC
  Administered 2016-10-01 – 2016-10-03 (×4): 550 mg via ORAL
  Filled 2016-10-01 (×4): qty 1

## 2016-10-01 MED ORDER — ZINC SULFATE 220 (50 ZN) MG PO CAPS
220.0000 mg | ORAL_CAPSULE | Freq: Two times a day (BID) | ORAL | Status: DC
  Administered 2016-10-01 – 2016-10-03 (×4): 220 mg via ORAL
  Filled 2016-10-01 (×5): qty 1

## 2016-10-01 NOTE — Consult Note (Signed)
Reason for Consult: cirrhosis, hepatic encephalopathy Referring Physician: Dr. Alonna Buck is an 76 y.o. male.  HPI: Seen in consultation at the request of Dr. Margaretmary Buck. The history is obtained through the patient, his wife and son at the bedside, and review of EPIC. He is followed by Dr. 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 He has CAD s/p MI, diabetes mellitus, hypertension, and multiple myeloma.   He was admitted through the Emergency Room with confusion. He was in his usual state of good health until 48 hours prior to admission when he developed the acute onset of confusion. He had had a week of insomnia and increasing difficulty concentrating. There was no constipation but his wife notes that his stools were harder. She notes 100% adherence with both lactulose and Xifaxan.  Since admission, he has been receiving a higher dose of lactulose with noted improvement in his confusion over the last 24 hours. A head CT was okay. He denied any infectious symptoms. No other associated symptoms. No identified exacerbating or relieving features.   Past Medical History:  Diagnosis Date  . Anemia   . Atherosclerotic heart disease    s/p MI, s/p stent mid circumflex  . CAD (coronary artery disease)    Dr Zachary Buck, cardiologist  . Cirrhosis of liver ([place])   . Clavicle fracture   . Diabetes mellitus without complication ([place])    oral med  . Elevated blood sugar    02/03/16 and 02/09/16 had BS over 400.  otherwise running around 250  . Esophageal varices ([place])   . Hepatic encephalopathy (Zachary Buck)   . Hypercholesterolemia   . Hypertension    controlled on meds  . Internal hemorrhoids   . Leg fracture   . Multiple myeloma (Zachary Buck)   . Myocardial infarct (Zachary Buck)   . Prostate enlargement   . Tubular adenoma of colon     Past Surgical History:  Procedure Laterality Date  .  COLONOSCOPY WITH PROPOFOL N/A 03/04/2015   Procedure: COLONOSCOPY WITH PROPOFOL;  Surgeon: Zachary Bears, MD;  Location: [facility];  Service: Gastroenterology;  Laterality: N/A;  . COLONOSCOPY WITH PROPOFOL N/A 12/09/2015   Procedure: COLONOSCOPY WITH PROPOFOL;  Surgeon: Zachary Lame, MD;  Location: [facility];  Service: Endoscopy;  Laterality: N/A;  DIABETIC-ORAL MEDS  . CORONARY ANGIOPLASTY WITH STENT PLACEMENT     2 stents  . ESOPHAGOGASTRODUODENOSCOPY (EGD) WITH PROPOFOL N/A 03/04/2015   Procedure: ESOPHAGOGASTRODUODENOSCOPY (EGD) WITH PROPOFOL;  Surgeon: Zachary Bears, MD;  Location: [facility];  Service: Gastroenterology;  Laterality: N/A;  . ESOPHAGOGASTRODUODENOSCOPY (EGD) WITH PROPOFOL N/A 12/09/2015   Procedure: ESOPHAGOGASTRODUODENOSCOPY (EGD) WITH PROPOFOL;  Surgeon: Zachary Lame, MD;  Location: [facility];  Service: Endoscopy;  Laterality: N/A;  . ESOPHAGOGASTRODUODENOSCOPY (EGD) WITH PROPOFOL N/A 12/21/2015   Procedure: ESOPHAGOGASTRODUODENOSCOPY (EGD) WITH PROPOFOL;  Surgeon: Zachary Lame, MD;  Location: [facility];  Service: Endoscopy;  Laterality: N/A;  . ESOPHAGOGASTRODUODENOSCOPY (EGD) WITH PROPOFOL N/A 02/24/2016   Procedure: ESOPHAGOGASTRODUODENOSCOPY (EGD) WITH PROPOFOL;  Surgeon: Zachary Lame, MD;  Location: [facility];  Service: Endoscopy;  Laterality: N/A;  diabetic - oral med  . POLYPECTOMY N/A 12/09/2015   Procedure: POLYPECTOMY;  Surgeon: Zachary Lame, MD;  Location: [facility];  Service: Endoscopy;  Laterality: N/A;  . TONSILLECTOMY      Family History  Problem Relation Age of Onset  . Aneurysm Father 41  of the heart  . Heart attack Father   . Diabetes Mother     Social History:  reports that he has never smoked. He has never used smokeless tobacco. He reports that he does not drink alcohol or use drugs.  Allergies:  Allergies  Allergen Reactions  . Niaspan [Niacin Er] Other (See Comments)    Just does not want to  take     Medications:  I have reviewed the patient's current medications. Prior to Admission:  Prescriptions Prior to Admission  Medication Sig Dispense Refill Last Dose  . Alpha-Lipoic Acid (LIPOIC ACID PO) Take 200 mg by mouth daily.    Past Month at Unknown time  . Ascorbic Acid (VITAMIN C) 1000 MG tablet Take 1,000 mg by mouth daily.   Past Month at Unknown time  . B Complex Vitamins (VITAMIN B COMPLEX) TABS Take by mouth daily.   Past Month at Unknown time  . blood glucose meter kit and supplies KIT Dispense based on patient and insurance preference. Use up to four times daily as directed. E11.9 Please dispense Freestyle freedom meter and supplies. 1 each 0 Taking  . Cholecalciferol (VITAMIN D-3) 1000 UNITS CAPS Take by mouth.   Past Month at Unknown time  . Cinnamon 500 MG capsule Take 500 mg by mouth.   Past Week at Unknown time  . Coenzyme Q10 100 MG capsule Take 100 mg by mouth daily.    Past Week at Unknown time  . furosemide (LASIX) 20 MG tablet Take 1 tablet (20 mg total) by mouth 2 (two) times daily. 60 tablet 3 PRN at PRN  . glipiZIDE (GLUCOTROL) 5 MG tablet TAKE 1 TABLET BY MOUTH EVERY MORNING AND 2 TABLETS BY MOUTH EVERY EVENING 270 tablet 0 09/29/2016 at 0800  . glucose blood (ONE TOUCH ULTRA TEST) test strip Use as instructed 100 each 12 Taking  . lactulose (CHRONULAC) 10 GM/15ML solution Take 15 mLs (10 g total) by mouth 2 (two) times daily. Titrate to 2 soft stools per day. Decrease if you start having diarrhea (Patient taking differently: Take 6.67 g by mouth 3 (three) times daily. TAKE 30 ML THREE TIMES DAILY) 946 mL 3 09/30/2016 at 0800  . Lancets (ONETOUCH ULTRASOFT) lancets Use as instructed 100 each 12 Taking  . lisinopril (PRINIVIL,ZESTRIL) 20 MG tablet TAKE ONE TABLET BY MOUTH EVERY DAY/ AM   Past Month at Unknown time  . metFORMIN (GLUCOPHAGE) 1000 MG tablet TAKE 1 TABLET BY MOUTH TWICE A DAY WITH A MEAL 180 tablet 1 09/29/2016 at 2000  . Omega-3 Fatty Acids (FISH OIL)  1000 MG CAPS Take 4,000 mg by mouth daily.    Past Week at Unknown time  . rifaximin (XIFAXAN) 550 MG TABS tablet Take 1 tablet (550 mg total) by mouth 2 (two) times daily. 60 tablet 11 09/29/2016 at 2000  . sildenafil (REVATIO) 20 MG tablet 3 to 5 tablets per day or as instructed   PRN at PRN  . spironolactone (ALDACTONE) 50 MG tablet Take 1 tablet (50 mg total) by mouth daily. 30 tablet 3 PRN at PRN  . Theanine 100 MG CAPS Take by mouth daily.   Past Week at Unknown time  . triamcinolone cream (KENALOG) 0.1 % Apply topically 2 (two) times daily. Please avoid face and genitalia area.  Do not use in the same spot for more than 7-10 days in a row. Pharmacy changed to [facility]. 80 g 0 PRN at PRN  . Turmeric POWD 4,000 mg by Does  not apply route daily.   Past Week at Unknown time  . vitamin B-12 (CYANOCOBALAMIN) 100 MCG tablet Take 200 mcg by mouth daily.   Past Week at Unknown time   Scheduled: . docusate sodium  100 mg Oral BID  . glipiZIDE  10 mg Oral BID AC  . heparin  5,000 Units Subcutaneous Q8H  . insulin aspart  0-9 Units Subcutaneous TID WC  . lactulose  10 g Oral Once  . lactulose  20 g Oral QID  . lisinopril  20 mg Oral Daily  . metFORMIN  500 mg Oral BID WC  . metoprolol tartrate  25 mg Oral BID  . pantoprazole (PROTONIX) IV  40 mg Intravenous Q12H  . rifaximin  550 mg Oral BID  . spironolactone  50 mg Oral Daily  . vitamin B-12  200 mcg Oral Daily  . vitamin C  1,000 mg Oral Daily   Continuous: . sodium chloride 100 mL/hr at 10/01/16 6578   ION:[id number] **OR** acetaminophen, bisacodyl, hydrALAZINE, ondansetron **OR** ondansetron (ZOFRAN) IV  Results for orders placed or performed during the hospital encounter of 09/30/16 (from the past 48 hour(s))  CBC     Status: Abnormal   Collection Time: 09/30/16  2:27 PM  Result Value Ref Range   WBC 5.2 3.8 - 10.6 K/uL   RBC 4.28 (L) 4.40 - 5.90 MIL/uL   Hemoglobin 12.5 (L) 13.0 - 18.0 g/dL   HCT 36.6 (L) 40.0 - 52.0 %    MCV 85.4 80.0 - 100.0 fL   MCH 29.3 26.0 - 34.0 pg   MCHC 34.3 32.0 - 36.0 g/dL   RDW 15.9 (H) 11.5 - 14.5 %   Platelets 81 (L) 150 - 440 K/uL  Comprehensive metabolic panel     Status: Abnormal   Collection Time: 09/30/16  2:27 PM  Result Value Ref Range   Sodium 138 135 - 145 mmol/L   Potassium 4.5 3.5 - 5.1 mmol/L    Comment: HEMOLYSIS AT THIS LEVEL MAY AFFECT RESULT   Chloride 108 101 - 111 mmol/L   CO2 21 (L) 22 - 32 mmol/L   Glucose, Bld 250 (H) 65 - 99 mg/dL   BUN 25 (H) 6 - 20 mg/dL   Creatinine, Ser 1.21 0.61 - 1.24 mg/dL   Calcium 10.1 8.9 - 10.3 mg/dL   Total Protein 8.4 (H) 6.5 - 8.1 g/dL   Albumin 3.5 3.5 - 5.0 g/dL   AST 35 15 - 41 U/L   ALT 32 17 - 63 U/L   Alkaline Phosphatase 74 38 - 126 U/L   Total Bilirubin 1.7 (H) 0.3 - 1.2 mg/dL   GFR calc non Af Amer 56 (L) >60 mL/min   GFR calc Af Amer >60 >60 mL/min    Comment: (NOTE) The eGFR has been calculated using the CKD EPI equation. This calculation has not been validated in all clinical situations. eGFR's persistently <60 mL/min signify possible Chronic Kidney Disease.    Anion gap 9 5 - 15  Troponin I     Status: None   Collection Time: 09/30/16  2:27 PM  Result Value Ref Range   Troponin I <0.03 <0.03 ng/mL  Ammonia     Status: Abnormal   Collection Time: 09/30/16  3:59 PM  Result Value Ref Range   Ammonia 103 (H) 9 - 35 umol/L  Urinalysis, Routine w reflex microscopic     Status: Abnormal   Collection Time: 09/30/16  3:59 PM  Result Value Ref Range  Color, Urine YELLOW (A) YELLOW   APPearance CLEAR (A) CLEAR   Specific Gravity, Urine 1.015 1.005 - 1.030   pH 6.0 5.0 - 8.0   Glucose, UA NEGATIVE NEGATIVE mg/dL   Hgb urine dipstick SMALL (A) NEGATIVE   Bilirubin Urine NEGATIVE NEGATIVE   Ketones, ur 5 (A) NEGATIVE mg/dL   Protein, ur 100 (A) NEGATIVE mg/dL   Nitrite NEGATIVE NEGATIVE   Leukocytes, UA NEGATIVE NEGATIVE   RBC / HPF 0-5 0 - 5 RBC/hpf   WBC, UA 0-5 0 - 5 WBC/hpf   Bacteria, UA  NONE SEEN NONE SEEN   Squamous Epithelial / LPF NONE SEEN NONE SEEN  Glucose, capillary     Status: Abnormal   Collection Time: 09/30/16  7:22 PM  Result Value Ref Range   Glucose-Capillary 183 (H) 65 - 99 mg/dL  Ammonia     Status: Abnormal   Collection Time: 10/01/16  4:44 AM  Result Value Ref Range   Ammonia 72 (H) 9 - 35 umol/L  Comprehensive metabolic panel     Status: Abnormal   Collection Time: 10/01/16  4:44 AM  Result Value Ref Range   Sodium 140 135 - 145 mmol/L   Potassium 4.1 3.5 - 5.1 mmol/L   Chloride 111 101 - 111 mmol/L   CO2 21 (L) 22 - 32 mmol/L   Glucose, Bld 234 (H) 65 - 99 mg/dL   BUN 21 (H) 6 - 20 mg/dL   Creatinine, Ser 1.08 0.61 - 1.24 mg/dL   Calcium 9.6 8.9 - 10.3 mg/dL   Total Protein 7.8 6.5 - 8.1 g/dL   Albumin 3.1 (L) 3.5 - 5.0 g/dL   AST 34 15 - 41 U/L   ALT 32 17 - 63 U/L   Alkaline Phosphatase 70 38 - 126 U/L   Total Bilirubin 2.0 (H) 0.3 - 1.2 mg/dL   GFR calc non Af Amer >60 >60 mL/min   GFR calc Af Amer >60 >60 mL/min    Comment: (NOTE) The eGFR has been calculated using the CKD EPI equation. This calculation has not been validated in all clinical situations. eGFR's persistently <60 mL/min signify possible Chronic Kidney Disease.    Anion gap 8 5 - 15  CBC     Status: Abnormal   Collection Time: 10/01/16  4:44 AM  Result Value Ref Range   WBC 5.2 3.8 - 10.6 K/uL   RBC 4.00 (L) 4.40 - 5.90 MIL/uL   Hemoglobin 12.0 (L) 13.0 - 18.0 g/dL   HCT 34.7 (L) 40.0 - 52.0 %   MCV 86.8 80.0 - 100.0 fL   MCH 30.0 26.0 - 34.0 pg   MCHC 34.6 32.0 - 36.0 g/dL   RDW 16.1 (H) 11.5 - 14.5 %   Platelets 77 (L) 150 - 440 K/uL  Protime-INR     Status: None   Collection Time: 10/01/16  4:44 AM  Result Value Ref Range   Prothrombin Time 14.5 11.4 - 15.2 seconds   INR 1.12   Glucose, capillary     Status: Abnormal   Collection Time: 10/01/16  7:47 AM  Result Value Ref Range   Glucose-Capillary 209 (H) 65 - 99 mg/dL  Glucose, capillary     Status:  Abnormal   Collection Time: 10/01/16 11:28 AM  Result Value Ref Range   Glucose-Capillary 294 (H) 65 - 99 mg/dL    Dg Chest 2 View  Result Date: 09/30/2016 CLINICAL DATA:  Altered mental status, history coronary artery disease post MI and  stenting, diabetes mellitus, hypertension EXAM: CHEST  2 VIEW COMPARISON:  07/09/2013 FINDINGS: Upper normal heart size. Mediastinal contours and pulmonary vascularity normal. Atherosclerotic calcification aorta. Mild bronchitic changes without infiltrate, pleural effusion or pneumothorax. Suspected BILATERAL nipple shadows. Scattered endplate spur formation thoracic spine and mild osseous demineralization. IMPRESSION: Bronchitic changes without infiltrate. Question BILATERAL nipple shadows ; repeat PA chest radiograph with nipple markers recommended to exclude pulmonary nodule. Electronically Signed   By: Lavonia Dana M.D.   On: 09/30/2016 15:35   Ct Head Wo Contrast  Result Date: 09/30/2016 CLINICAL DATA:  Confusion, disorientation, difficulty finding words, history cirrhosis, multiple myeloma, diabetes mellitus, coronary artery disease post MI and stenting, hypertension EXAM: CT HEAD WITHOUT CONTRAST TECHNIQUE: Contiguous axial images were obtained from the base of the skull through the vertex without intravenous contrast. Sagittal and coronal MPR images reconstructed from axial data set. COMPARISON:  None FINDINGS: Brain: Mild generalized atrophy. Normal ventricular morphology. No midline shift or mass effect. Otherwise normal appearance of brain parenchyma. No intracranial hemorrhage, mass lesion, or evidence acute infarction. No extra-axial fluid collections. Vascular: Atherosclerotic calcifications at skullbase Skull: Appears demineralized but intact Sinuses/Orbits: Clear Other: N/A IMPRESSION: No acute intracranial abnormalities. Electronically Signed   By: Lavonia Dana M.D.   On: 09/30/2016 15:41    Review of Systems  Constitutional: Negative for chills and  fever.  HENT: Negative for hearing loss and tinnitus.   Eyes: Negative for blurred vision and double vision.  Respiratory: Negative for cough and hemoptysis.   Cardiovascular: Negative for chest pain and palpitations.  Gastrointestinal: Negative for constipation, heartburn and nausea.  Genitourinary: Negative for dysuria and urgency.  Musculoskeletal: Negative for myalgias and neck pain.  Skin: Negative for itching and rash.  Neurological: Negative for dizziness and headaches.  Psychiatric/Behavioral: Negative for depression. The patient has insomnia.    Blood pressure (!) 161/64, pulse (!) 56, temperature (!) 97.5 F (36.4 C), temperature source Oral, resp. rate 18, height 5' 9"  (1.753 m), weight 165 lb 12.8 oz (75.2 kg), SpO2 98 %. Physical Exam  Constitutional: He is oriented to person, place, and time. He appears well-nourished. No distress.  Looks slightly older than his stated age, sarcopenia present  HENT:  Head: Normocephalic and atraumatic.  Mouth/Throat: No oropharyngeal exudate.  Bilateral temporal wasting  Eyes: Conjunctivae are normal. No scleral icterus.  Neck: Neck supple. No thyromegaly present.  Cardiovascular: Normal rate and regular rhythm.   Respiratory: Effort normal and breath sounds normal.  GI: Soft. Bowel sounds are normal. He exhibits no distension and no mass. There is no tenderness. There is no rebound and no guarding.  Musculoskeletal: Normal range of motion. He exhibits no edema.  Neurological: He is alert and oriented to person, place, and time.  + asterixis + clonus  Skin: Skin is dry.  Spider angioma on the chest wall  Psychiatric:  Impaired judgement. Slightly hyperactive.     Assessment/Plan: Cirrhosis due to fatty liver complicated by portal hypertension    - MELD 11, Child's B (CPT 9)    - last abdominal imaging that I can locate is from 02/2015    - non-bleeding varices previously banded by Dr. 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 However, his wife  gives him multiple herbal therapies. I have suggested that she bring a list of all of these supplements to the hospital/clinic. Would recommend pan-culture if this has not already been performed.   Increase lactulose to q2 hour dosing until his mental status is at baseline. Then, resume lactulose QID. Continue Xifaxan.  Add zinc 600 mg daily. Discussed need to avoid constipation with the family. Reviewed s/sx of early HE so that treatment could be adjusted at home to prevent advanced HE in the future.   Would plan abdominal imaging while hospitalized for [facility] screening if this has not been performed in the last 6 months. I am only able to find a CT scan from 03/16/15.   His son asked about liver transplant. Mr. Coke is not a candidate for transplant based on age and comorbidities.  Thank you for allowing me to participate in Zachary Buck care. Please call with any questions or concerns.  [place] 10/01/2016, 4:36 PM

## 2016-10-01 NOTE — Consult Note (Signed)
[place]  Telephone:([phone]) [phone] Fax:(336) [phone]  ID: Zachary Buck OB: 05-Jan-1941  MR#: [id number]  EXB#:[id number]  Patient Care Team: Einar Pheasant, MD as PCP - General (Internal Medicine) Pyrtle, Lajuan Lines, MD as Consulting Physician (Gastroenterology)  CHIEF COMPLAINT: History of stage II multiple myeloma, iron deficiency admitted with altered mental status and hepatic encephalopathy.  INTERVAL HISTORY: Patient is 76 year old male who has a history of multiple myeloma not currently under treatment as well as iron deficiency anemia. He was recently admitted to the hospital with altered mental status secondary to hepatic encephalopathy from his known cirrhosis. Patient's mental status has improved this morning and is nearly back to his baseline. He has no neurologic complaints. He denies any recent fevers. He is good appetite and denies weight loss. He has no chest pain or shortness of breath. He denies any nausea, vomiting, constipation, or diarrhea. He has no urinary complaints. Patient offers no further specific complaints today.  REVIEW OF SYSTEMS:   Review of Systems  Constitutional: Negative.  Negative for fever, malaise/fatigue and weight loss.  Respiratory: Negative.  Negative for cough and shortness of breath.   Cardiovascular: Negative.  Negative for chest pain and leg swelling.  Gastrointestinal: Negative.  Negative for abdominal pain, blood in stool and melena.  Genitourinary: Negative.   Musculoskeletal: Negative.   Skin: Negative.  Negative for rash.  Neurological: Negative for sensory change, focal weakness and weakness.  Psychiatric/Behavioral: The patient is not nervous/anxious.        Mild confusion    As per HPI. Otherwise, a complete review of systems is negative.  PAST MEDICAL HISTORY: Past Medical History:  Diagnosis Date  . Anemia   . Atherosclerotic heart disease    s/p MI, s/p stent mid circumflex  . CAD (coronary  artery disease)    Dr Nehemiah Massed, cardiologist  . Cirrhosis of liver ([place])   . Clavicle fracture   . Diabetes mellitus without complication ([place])    oral med  . Elevated blood sugar    02/03/16 and 02/09/16 had BS over 400.  otherwise running around 250  . Esophageal varices ([place])   . Hepatic encephalopathy ([place])   . Hypercholesterolemia   . Hypertension    controlled on meds  . Internal hemorrhoids   . Leg fracture   . Multiple myeloma ([place])   . Myocardial infarct ([place])   . Prostate enlargement   . Tubular adenoma of colon     PAST SURGICAL HISTORY: Past Surgical History:  Procedure Laterality Date  . COLONOSCOPY WITH PROPOFOL N/A 03/04/2015   Procedure: COLONOSCOPY WITH PROPOFOL;  Surgeon: Jerene Bears, MD;  Location: [facility];  Service: Gastroenterology;  Laterality: N/A;  . COLONOSCOPY WITH PROPOFOL N/A 12/09/2015   Procedure: COLONOSCOPY WITH PROPOFOL;  Surgeon: [name], MD;  Location: [facility];  Service: Endoscopy;  Laterality: N/A;  DIABETIC-ORAL MEDS  . CORONARY ANGIOPLASTY WITH STENT PLACEMENT     2 stents  . ESOPHAGOGASTRODUODENOSCOPY (EGD) WITH PROPOFOL N/A 03/04/2015   Procedure: ESOPHAGOGASTRODUODENOSCOPY (EGD) WITH PROPOFOL;  Surgeon: Jerene Bears, MD;  Location: [facility];  Service: Gastroenterology;  Laterality: N/A;  . ESOPHAGOGASTRODUODENOSCOPY (EGD) WITH PROPOFOL N/A 12/09/2015   Procedure: ESOPHAGOGASTRODUODENOSCOPY (EGD) WITH PROPOFOL;  Surgeon: [name], MD;  Location: [facility];  Service: Endoscopy;  Laterality: N/A;  . ESOPHAGOGASTRODUODENOSCOPY (EGD) WITH PROPOFOL N/A 12/21/2015   Procedure: ESOPHAGOGASTRODUODENOSCOPY (EGD) WITH PROPOFOL;  Surgeon: [name], MD;  Location: [facility];  Service: Endoscopy;  Laterality: N/A;  .  ESOPHAGOGASTRODUODENOSCOPY (EGD) WITH PROPOFOL N/A 02/24/2016   Procedure: ESOPHAGOGASTRODUODENOSCOPY (EGD) WITH PROPOFOL;  Surgeon: [name], MD;  Location: [place];  Service:  Endoscopy;  Laterality: N/A;  diabetic - oral med  . POLYPECTOMY N/A 12/09/2015   Procedure: POLYPECTOMY;  Surgeon: [name], MD;  Location: [place];  Service: Endoscopy;  Laterality: N/A;  . TONSILLECTOMY      FAMILY HISTORY: Family History  Problem Relation Age of Onset  . Aneurysm Father 55       of the heart  . Heart attack Father   . Diabetes Mother     ADVANCED DIRECTIVES (Y/N):  @ADVDIR @  HEALTH MAINTENANCE: Social History  Substance Use Topics  . Smoking status: Never Smoker  . Smokeless tobacco: Never Used  . Alcohol use No     Colonoscopy:  PAP:  Bone density:  Lipid panel:  Allergies  Allergen Reactions  . Niaspan [Niacin Er] Other (See Comments)    Just does not want to take     Current Facility-Administered Medications  Medication Dose Route Frequency Provider Last Rate Last Dose  . 0.9 %  sodium chloride infusion   Intravenous Continuous Idelle Crouch, MD 100 mL/hr at 10/01/16 0926    . acetaminophen (TYLENOL) tablet 650 mg  650 mg Oral Q6H PRN Idelle Crouch, MD       Or  . acetaminophen (TYLENOL) suppository 650 mg  650 mg Rectal Q6H PRN Idelle Crouch, MD      . bisacodyl (DULCOLAX) suppository 10 mg  10 mg Rectal Daily PRN Idelle Crouch, MD      . docusate sodium (COLACE) capsule 100 mg  100 mg Oral BID Idelle Crouch, MD      . glipiZIDE (GLUCOTROL) tablet 10 mg  10 mg Oral BID AC Idelle Crouch, MD      . heparin injection 5,000 Units  5,000 Units Subcutaneous Q8H Idelle Crouch, MD   5,000 Units at 10/01/16 0612  . hydrALAZINE (APRESOLINE) injection 10 mg  10 mg Intravenous Q6H PRN Henreitta Leber, MD      . insulin aspart (novoLOG) injection 0-9 Units  0-9 Units Subcutaneous TID WC Idelle Crouch, MD   3 Units at 10/01/16 0809  . lactulose (CHRONULAC) 10 GM/15ML solution 10 g  10 g Oral Once Delman Kitten, MD      . lactulose (CHRONULAC) 10 GM/15ML solution 20 g  20 g Oral QID Henreitta Leber, MD   20 g at  10/01/16 0926  . lisinopril (PRINIVIL,ZESTRIL) tablet 20 mg  20 mg Oral Daily Idelle Crouch, MD   20 mg at 10/01/16 0925  . LORazepam (ATIVAN) injection 0.5 mg  0.5 mg Intravenous Q4H PRN Idelle Crouch, MD      . metFORMIN (GLUCOPHAGE) tablet 500 mg  500 mg Oral BID WC Idelle Crouch, MD      . metoprolol tartrate (LOPRESSOR) tablet 25 mg  25 mg Oral BID Idelle Crouch, MD   25 mg at 10/01/16 0924  . ondansetron (ZOFRAN) tablet 4 mg  4 mg Oral Q6H PRN Idelle Crouch, MD       Or  . ondansetron (ZOFRAN) injection 4 mg  4 mg Intravenous Q6H PRN Idelle Crouch, MD      . pantoprazole (PROTONIX) injection 40 mg  40 mg Intravenous Q12H Idelle Crouch, MD   40 mg at 10/01/16 0926  . rifaximin (XIFAXAN) tablet 550 mg  550 mg Oral BID Idelle Crouch, MD   550 mg at 10/01/16 5170  . spironolactone (ALDACTONE) tablet 50 mg  50 mg Oral Daily Idelle Crouch, MD   50 mg at 10/01/16 0174  . vitamin B-12 (CYANOCOBALAMIN) tablet 200 mcg  200 mcg Oral Daily Idelle Crouch, MD   200 mcg at 10/01/16 0925  . vitamin C (ASCORBIC ACID) tablet 1,000 mg  1,000 mg Oral Daily Idelle Crouch, MD   1,000 mg at 10/01/16 9449   Facility-Administered Medications Ordered in Other Encounters  Medication Dose Route Frequency Provider Last Rate Last Dose  . alteplase (CATHFLO ACTIVASE) injection 2 mg  2 mg Intracatheter Once PRN Nolon Stalls C, MD      . heparin lock flush 100 unit/mL  500 Units Intracatheter Once PRN Mike Gip, Melissa C, MD      . heparin lock flush 100 unit/mL  250 Units Intracatheter Once PRN Mike Gip, Melissa C, MD      . sodium chloride 0.9 % injection 10 mL  10 mL Intracatheter PRN Corcoran, Melissa C, MD      . sodium chloride 0.9 % injection 3 mL  3 mL Intravenous Once PRN Lequita Asal, MD        OBJECTIVE: Vitals:   10/01/16 0347 10/01/16 0808  BP: (!) 172/75 (!) 162/69  Pulse: 63 64  Resp: 20 18  Temp: 97.7 F (36.5 C) 97.9 F (36.6 C)  SpO2: 97%  96%     Body mass index is 24.48 kg/m.    ECOG FS:1 - Symptomatic but completely ambulatory  General: Well-developed, well-nourished, no acute distress. Eyes: Pink conjunctiva, anicteric sclera. HEENT: Normocephalic, moist mucous membranes, clear oropharnyx. Lungs: Clear to auscultation bilaterally. Heart: Regular rate and rhythm. No rubs, murmurs, or gallops. Abdomen: Soft, nontender, nondistended. No organomegaly noted, normoactive bowel sounds. Musculoskeletal: No edema, cyanosis, or clubbing. Neuro: Alert, answering all questions appropriately but mildly confused. Cranial nerves grossly intact. Skin: No rashes or petechiae noted. Psych: Normal affect. Lymphatics: No cervical, calvicular, axillary or inguinal LAD.   LAB RESULTS:  Lab Results  Component Value Date   NA 140 10/01/2016   K 4.1 10/01/2016   CL 111 10/01/2016   CO2 21 (L) 10/01/2016   GLUCOSE 234 (H) 10/01/2016   BUN 21 (H) 10/01/2016   CREATININE 1.08 10/01/2016   CALCIUM 9.6 10/01/2016   PROT 7.8 10/01/2016   ALBUMIN 3.1 (L) 10/01/2016   AST 34 10/01/2016   ALT 32 10/01/2016   ALKPHOS 70 10/01/2016   BILITOT 2.0 (H) 10/01/2016   GFRNONAA >60 10/01/2016   GFRAA >60 10/01/2016    Lab Results  Component Value Date   WBC 5.2 10/01/2016   NEUTROABS 3.4 08/30/2016   HGB 12.0 (L) 10/01/2016   HCT 34.7 (L) 10/01/2016   MCV 86.8 10/01/2016   PLT 77 (L) 10/01/2016     STUDIES: Dg Chest 2 View  Result Date: 09/30/2016 CLINICAL DATA:  Altered mental status, history coronary artery disease post MI and stenting, diabetes mellitus, hypertension EXAM: CHEST  2 VIEW COMPARISON:  07/09/2013 FINDINGS: Upper normal heart size. Mediastinal contours and pulmonary vascularity normal. Atherosclerotic calcification aorta. Mild bronchitic changes without infiltrate, pleural effusion or pneumothorax. Suspected BILATERAL nipple shadows. Scattered endplate spur formation thoracic spine and mild osseous demineralization.  IMPRESSION: Bronchitic changes without infiltrate. Question BILATERAL nipple shadows ; repeat PA chest radiograph with nipple markers recommended to exclude pulmonary nodule. Electronically Signed   By: Crist Infante.D.  On: 09/30/2016 15:35   Ct Head Wo Contrast  Result Date: 09/30/2016 CLINICAL DATA:  Confusion, disorientation, difficulty finding words, history cirrhosis, multiple myeloma, diabetes mellitus, coronary artery disease post MI and stenting, hypertension EXAM: CT HEAD WITHOUT CONTRAST TECHNIQUE: Contiguous axial images were obtained from the base of the skull through the vertex without intravenous contrast. Sagittal and coronal MPR images reconstructed from axial data set. COMPARISON:  None FINDINGS: Brain: Mild generalized atrophy. Normal ventricular morphology. No midline shift or mass effect. Otherwise normal appearance of brain parenchyma. No intracranial hemorrhage, mass lesion, or evidence acute infarction. No extra-axial fluid collections. Vascular: Atherosclerotic calcifications at skullbase Skull: Appears demineralized but intact Sinuses/Orbits: Clear Other: N/A IMPRESSION: No acute intracranial abnormalities. Electronically Signed   By: Lavonia Dana M.D.   On: 09/30/2016 15:41    ASSESSMENT: History of stage II multiple myeloma, iron deficiency admitted with altered mental status and hepatic encephalopathy.  PLAN:    1. Multiple myeloma: Patient currently is not receiving treatment and under simple observation with Dr. Mike Gip. No intervention is needed at this time. He has been instructed to keep his previously scheduled follow-up appointment in the next 1-2 weeks for repeat laboratory work and further evaluation. 2. Iron deficiency anemia: Patient receives IV Venofer periodically with his last infusion in June 2018. His hemoglobin is 12.0 today. No intervention is needed. Follow-up in the [facility] as above. 3. Hepatic encephalopathy: Secondary to underlying cirrhosis.  Improving.  4. Thrombocytopenia: Patient's platelet count is 77 today which appears to be approximately his baseline. Monitor.   Appreciate consult, call with questions.  Lloyd Huger, MD   10/01/2016 11:16 AM

## 2016-10-01 NOTE — Evaluation (Signed)
Physical Therapy Evaluation Patient Details Name: AMI THORNSBERRY MRN: [id number] DOB: 11-22-1940 Today's Date: 10/01/2016   History of Present Illness  JAYMASON LEDESMA is a 76 y.o. male has a past medical history significant for CAD s/p MI, DM, HTN, multiple myeloma, IDA, and cirrhosis now with 1-2 day hx of progressive weakness and confusion. Patient diagnosed with hepatic encephalopathy with elevated ammonia levels;   Clinical Impression  76 yo Male came to ED with hepatic encephalopathy and high ammonia levels; Patient was independent and living with his wife prior to admittance. He did not use any assistive device and denies any recent falls. He is currently mod I for bed mobility; He requires supervision for sit<>Stand transfer for safety; Patient ambulated 175 feet without AD, min A for safety with unsteadiness towards left side. He exhibits impaired balance with positive Rhomberg sign after 5 sec with eyes closed. Patient unable to hold tandem stance unsupported. He exhibits good strength in BLE. He would benefit from skilled PT intervention to improve balance and gait safety; Recommend patient consider [facility] upon discharge to address balance concerns at home.     Follow Up Recommendations Home health PT;Supervision for mobility/OOB (for balance/safety; per family, they want to wait and see how he does while in acute care hoping that he won't need follow up PT; )    Equipment Recommendations  None recommended by PT    Recommendations for Other Services       Precautions / Restrictions Precautions Precautions: Fall Restrictions Weight Bearing Restrictions: No      Mobility  Bed Mobility Overal bed mobility: Modified Independent             General bed mobility comments: uses bed rail for supine<>Sitting with elevated head of bed; able to position self well without cues; demonstrates good safety awareness;   Transfers Overall transfer level: Needs  assistance Equipment used: None Transfers: Sit to/from Stand Sit to Stand: Supervision         General transfer comment: requires close supervision for sit<>Stand transfer due to unsteadiness;   Ambulation/Gait Ambulation/Gait assistance: Min assist Ambulation Distance (Feet): 175 Feet Assistive device: None Gait Pattern/deviations: Step-through pattern;Decreased step length - right;Decreased step length - left;Narrow base of support;Drifts right/left;Staggering left Gait velocity: decreased   General Gait Details: patient exhibits unsteady gait pattern with increased lean to left side requiring min A to right self during ambulation;   Stairs            Wheelchair Mobility    Modified Rankin (Stroke Patients Only)       Balance Overall balance assessment: Needs assistance Sitting-balance support: No upper extremity supported;Feet supported Sitting balance-Leahy Scale: Good     Standing balance support: No upper extremity supported Standing balance-Leahy Scale: Fair Standing balance comment: requires min A to keep balance during dynamic balance;          Rhomberg - Eyes Opened: 10 Rhomberg - Eyes Closed: 5 (with loss of balance to left side; )                 Pertinent Vitals/Pain Pain Assessment: No/denies pain    Home Living Family/patient expects to be discharged to:: Private residence Living Arrangements: Spouse/significant other Available Help at Discharge: Family Type of Home: House Home Access: Level entry     Home Layout: Two level;Able to live on main level with bedroom/bathroom Home Equipment: None      Prior Function Level of Independence: Independent  Comments: patient was independent in all self care ADLs; walks around independently, still driving; Patient has exercise equipment at home and was walking on treadmill etc;      Hand Dominance        Extremity/Trunk Assessment   Upper Extremity Assessment Upper  Extremity Assessment: Overall WFL for tasks assessed    Lower Extremity Assessment Lower Extremity Assessment: Overall WFL for tasks assessed (gross strength 5/5 bilaterally; )    Cervical / Trunk Assessment Cervical / Trunk Assessment: Normal  Communication   Communication: No difficulties  Cognition Arousal/Alertness: Awake/alert Behavior During Therapy: WFL for tasks assessed/performed Overall Cognitive Status: Within Functional Limits for tasks assessed                                        General Comments General comments (skin integrity, edema, etc.): skin grossly intact; no swelling noted;     Exercises     Assessment/Plan    PT Assessment Patient needs continued PT services  PT Problem List Decreased activity tolerance;Decreased balance;Decreased mobility;Decreased safety awareness       PT Treatment Interventions Gait training;Functional mobility training;Balance training;Therapeutic exercise;Therapeutic activities;Patient/family education    PT Goals (Current goals can be found in the Care Plan section)  Acute Rehab PT Goals Patient Stated Goal: "I want to go home."  PT Goal Formulation: With patient Time For Goal Achievement: 10/15/16 Potential to Achieve Goals: Good    Frequency Min 2X/week   Barriers to discharge   NA    Co-evaluation               AM-PAC PT "6 Clicks" Daily Activity  Outcome Measure Difficulty turning over in bed (including adjusting bedclothes, sheets and blankets)?: None Difficulty moving from lying on back to sitting on the side of the bed? : A Little Difficulty sitting down on and standing up from a chair with arms (e.g., wheelchair, bedside commode, etc,.)?: A Little Help needed moving to and from a bed to chair (including a wheelchair)?: None Help needed walking in hospital room?: A Little Help needed climbing 3-5 steps with a railing? : A Little 6 Click Score: 20    End of Session Equipment  Utilized During Treatment: Gait belt Activity Tolerance: Patient tolerated treatment well;No increased pain Patient left: in chair;with call bell/phone within reach;with family/visitor present;with chair alarm set Nurse Communication: Mobility status PT Visit Diagnosis: Unsteadiness on feet (R26.81)    Time: 6754-4920 PT Time Calculation (min) (ACUTE ONLY): 27 min   Charges:   PT Evaluation $PT Eval Low Complexity: 1 Low     PT G Codes:   PT G-Codes **NOT FOR INPATIENT CLASS** Functional Assessment Tool Used: AM-PAC 6 Clicks Basic Mobility;Clinical judgement Functional Limitation: Mobility: Walking and moving around Mobility: Walking and Moving Around Current Status (F0071): At least 20 percent but less than 40 percent impaired, limited or restricted Mobility: Walking and Moving Around Goal Status [phone]): At least 1 percent but less than 20 percent impaired, limited or restricted     Trotter,Margaret PT, DPT 10/01/2016, 2:40 PM

## 2016-10-01 NOTE — Care Management Note (Signed)
Case Management Note  Patient Details  Name: Zachary Buck MRN: [id number] Date of Birth: 07-Sep-1940  Subjective/Objective:     Zachary Buck responds to simple questions sluggishly this morning. He resides at home with his wife who assists with transportation when needed. He normally drives himself to appointments. His Primary Care Physician is Radio [facility]. Pharmacy is [facility]. Zachary Buck reports having no assistive equipment at home "because I don't need any." No home oxygen. No home health services. Per Zachary Buck he is independent with activities of daily living at home. Wife is not present at this time and may be a source of additional discharge planning information. Case management will follow for discharge planning.                Action/Plan:   Expected Discharge Date:                  Expected Discharge Plan:  [place]  In-House Referral:  NA  Discharge planning Services  CM Consult  Post Acute Care Choice:    Choice offered to:     DME Arranged:    DME Agency:     HH Arranged:    HH Agency:     Status of Service:  In process, will continue to follow  If discussed at Long Length of Stay Meetings, dates discussed:    Additional Comments:  Khianna Blazina A, RN 10/01/2016, 10:09 AM

## 2016-10-01 NOTE — Progress Notes (Signed)
[facility] [place] NAME: Zachary Buck    MR#:  [id number]  DATE OF BIRTH:  28-Nov-1940  SUBJECTIVE:  CHIEF COMPLAINT:  Clinically better., mentating almost at his baseline  REVIEW OF SYSTEMS:  CONSTITUTIONAL: No fever, fatigue or weakness.  EYES: No blurred or double vision.  EARS, NOSE, AND THROAT: No tinnitus or ear pain.  RESPIRATORY: No cough, shortness of breath, wheezing or hemoptysis.  CARDIOVASCULAR: No chest pain, orthopnea, edema.  GASTROINTESTINAL: No nausea, vomiting, diarrhea or abdominal pain.  GENITOURINARY: No dysuria, hematuria.  ENDOCRINE: No polyuria, nocturia,  HEMATOLOGY: No anemia, easy bruising or bleeding SKIN: No rash or lesion. MUSCULOSKELETAL: No joint pain or arthritis.   NEUROLOGIC: No tingling, numbness, weakness.  PSYCHIATRY: No anxiety or depression.   DRUG ALLERGIES:   Allergies  Allergen Reactions  . Niaspan [Niacin Er] Other (See Comments)    Just does not want to take     VITALS:  Blood pressure (!) 162/69, pulse 64, temperature 97.9 F (36.6 C), temperature source Oral, resp. rate 18, height 5' 9"  (1.753 m), weight 75.2 kg (165 lb 12.8 oz), SpO2 96 %.  PHYSICAL EXAMINATION:  GENERAL:  76 y.o.-year-old patient lying in the bed with no acute distress.  EYES: Pupils equal, round, reactive to light and accommodation. No scleral icterus. Extraocular muscles intact.  HEENT: Head atraumatic, normocephalic. Oropharynx and nasopharynx clear.  NECK:  Supple, no jugular venous distention. No thyroid enlargement, no tenderness.  LUNGS: Normal breath sounds bilaterally, no wheezing, rales,rhonchi or crepitation. No use of accessory muscles of respiration.  CARDIOVASCULAR: S1, S2 normal. No murmurs, rubs, or gallops.  ABDOMEN: Soft, nontender, nondistended. Bowel sounds present. EXTREMITIES: No pedal edema, cyanosis, or clubbing.  NEUROLOGIC: Cranial nerves II through XII are intact.  Muscle strength at his baseline in all extremities. Sensation intact. Gait not checked.  PSYCHIATRIC: The patient is alert and oriented x2- 3. Disoriented to time SKIN: No obvious rash, lesion, or ulcer.    LABORATORY PANEL:   CBC  Recent Labs Lab 10/01/16 0444  WBC 5.2  HGB 12.0*  HCT 34.7*  PLT 77*   ------------------------------------------------------------------------------------------------------------------  Chemistries   Recent Labs Lab 10/01/16 0444  NA 140  K 4.1  CL 111  CO2 21*  GLUCOSE 234*  BUN 21*  CREATININE 1.08  CALCIUM 9.6  AST 34  ALT 32  ALKPHOS 70  BILITOT 2.0*   ------------------------------------------------------------------------------------------------------------------  Cardiac Enzymes  Recent Labs Lab 09/30/16 1427  TROPONINI <0.03   ------------------------------------------------------------------------------------------------------------------  RADIOLOGY:  Dg Chest 2 View  Result Date: 09/30/2016 CLINICAL DATA:  Altered mental status, history coronary artery disease post MI and stenting, diabetes mellitus, hypertension EXAM: CHEST  2 VIEW COMPARISON:  07/09/2013 FINDINGS: Upper normal heart size. Mediastinal contours and pulmonary vascularity normal. Atherosclerotic calcification aorta. Mild bronchitic changes without infiltrate, pleural effusion or pneumothorax. Suspected BILATERAL nipple shadows. Scattered endplate spur formation thoracic spine and mild osseous demineralization. IMPRESSION: Bronchitic changes without infiltrate. Question BILATERAL nipple shadows ; repeat PA chest radiograph with nipple markers recommended to exclude pulmonary nodule. Electronically Signed   By: Lavonia Dana M.D.   On: 09/30/2016 15:35   Ct Head Wo Contrast  Result Date: 09/30/2016 CLINICAL DATA:  Confusion, disorientation, difficulty finding words, history cirrhosis, multiple myeloma, diabetes mellitus, coronary artery disease post MI and  stenting, hypertension EXAM: CT HEAD WITHOUT CONTRAST TECHNIQUE: Contiguous axial images were obtained from the base of the skull through the vertex without intravenous contrast. Sagittal  and coronal MPR images reconstructed from axial data set. COMPARISON:  None FINDINGS: Brain: Mild generalized atrophy. Normal ventricular morphology. No midline shift or mass effect. Otherwise normal appearance of brain parenchyma. No intracranial hemorrhage, mass lesion, or evidence acute infarction. No extra-axial fluid collections. Vascular: Atherosclerotic calcifications at skullbase Skull: Appears demineralized but intact Sinuses/Orbits: Clear Other: N/A IMPRESSION: No acute intracranial abnormalities. Electronically Signed   By: Lavonia Dana M.D.   On: 09/30/2016 15:41    EKG:   Orders placed or performed in visit on 08/26/14  . 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 Ammonia levels are trending down. Continue lactulose ,xifaxan MRI brain pending  Follow-up with gastroenterology  #Multiple myeloma-stage II, chronic iron deficiency Currently not on any treatments. Outpatient follow-up with Dr. 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 Monitor platelet count  PT consult   All the records are reviewed and case discussed with Care Management/Social Workerr. Management plans discussed with the patient, family and they are in agreement.  CODE STATUS: fc   TOTAL TIME TAKING CARE OF THIS PATIENT: 35 minutes.   POSSIBLE D/C IN 1-2 DAYS, DEPENDING ON CLINICAL CONDITION.  Note: This dictation was prepared with Dragon dictation along with smaller phrase technology. Any transcriptional errors that result from this process are unintentional.   Zachary Buck M.D on 10/01/2016 at 1:34 PM  Between 7am to  6pm - Pager - [phone] After 6pm go to [email or web address] - password [email or web address] [facility]  Office  [phone]  CC: Primary care physician; Einar Pheasant, MD

## 2016-10-01 NOTE — Progress Notes (Signed)
Pnt admission completed earlier in shift when pnt wife was at bedside. Pnt 's blood pressure elevated earlier in shift, called and notified DR. Sainani. Per orders give nighttime BP meds early. Pnt medicated per orders,re-check  Bp SBP 160(per flowsheet and pnt wife baseline). Pnt denies any pain or discomfort at this time bed is low, locked with call bell in reach. Bed alarm is on. No other issues or concerns at this time. Will continue to monitor and assess.

## 2016-10-01 NOTE — Progress Notes (Signed)
Pt is on a clear liquid diet and oral intake is small this AM. He is currently ordered sliding scale insulin, metformin, and glipizide. I spoke to Dr. Margaretmary Eddy and she stated that it would be ok to hold the metformin and glipizide for now.

## 2016-10-02 ENCOUNTER — Inpatient Hospital Stay: Payer: PPO

## 2016-10-02 DIAGNOSIS — K7290 Hepatic failure, unspecified without coma: Principal | ICD-10-CM

## 2016-10-02 LAB — COMPREHENSIVE METABOLIC PANEL
ALT: 27 U/L (ref 17–63)
ANION GAP: 5 (ref 5–15)
AST: 27 U/L (ref 15–41)
Albumin: 2.9 g/dL — ABNORMAL LOW (ref 3.5–5.0)
Alkaline Phosphatase: 63 U/L (ref 38–126)
BILIRUBIN TOTAL: 1.6 mg/dL — AB (ref 0.3–1.2)
BUN: 17 mg/dL (ref 6–20)
CO2: 22 mmol/L (ref 22–32)
Calcium: 9.6 mg/dL (ref 8.9–10.3)
Chloride: 111 mmol/L (ref 101–111)
Creatinine, Ser: 1.15 mg/dL (ref 0.61–1.24)
GFR calc Af Amer: >60 mL/min (ref >60)
GFR, EST NON AFRICAN AMERICAN: 60 mL/min — AB (ref >60)
GLUCOSE: 258 mg/dL — AB (ref 65–99)
POTASSIUM: 3.9 mmol/L (ref 3.5–5.1)
Sodium: 138 mmol/L (ref 135–145)
TOTAL PROTEIN: 7.3 g/dL (ref 6.5–8.1)

## 2016-10-02 LAB — GLUCOSE, CAPILLARY
GLUCOSE-CAPILLARY: 294 mg/dL — AB (ref 65–99)
GLUCOSE-CAPILLARY: 209 mg/dL — AB (ref 65–99)
Glucose-Capillary: 233 mg/dL — ABNORMAL HIGH (ref 65–99)
Glucose-Capillary: 219 mg/dL — ABNORMAL HIGH (ref 65–99)

## 2016-10-02 LAB — CBC
HEMATOCRIT: 32.4 % — AB (ref 40.0–52.0)
Hemoglobin: 11.1 g/dL — ABNORMAL LOW (ref 13.0–18.0)
MCH: 29.3 pg (ref 26.0–34.0)
MCHC: 34.2 g/dL (ref 32.0–36.0)
MCV: 85.8 fL (ref 80.0–100.0)
PLATELETS: 77 10*3/uL — AB (ref 150–440)
RBC: 3.78 MIL/uL — ABNORMAL LOW (ref 4.40–5.90)
RDW: 16.3 % — AB (ref 11.5–14.5)
WBC: 6.8 10*3/uL (ref 3.8–10.6)

## 2016-10-02 LAB — AMMONIA: AMMONIA: 34 umol/L (ref 9–35)

## 2016-10-02 MED ORDER — INSULIN ASPART 100 UNIT/ML ~~LOC~~ SOLN
0.0000 [IU] | Freq: Every day | SUBCUTANEOUS | Status: DC
  Administered 2016-10-02: 2 [IU] via SUBCUTANEOUS
  Filled 2016-10-02: qty 1

## 2016-10-02 MED ORDER — PANTOPRAZOLE SODIUM 40 MG PO TBEC
40.0000 mg | DELAYED_RELEASE_TABLET | Freq: Every day | ORAL | Status: DC
  Administered 2016-10-02 – 2016-10-03 (×2): 40 mg via ORAL
  Filled 2016-10-02 (×2): qty 1

## 2016-10-02 MED ORDER — GLIPIZIDE 5 MG PO TABS: 5.0000 mg | ORAL_TABLET | ORAL | 1 refills | Status: AC

## 2016-10-02 MED ORDER — INSULIN ASPART 100 UNIT/ML ~~LOC~~ SOLN
0.0000 [IU] | Freq: Three times a day (TID) | SUBCUTANEOUS | Status: DC
  Administered 2016-10-02: 5 [IU] via SUBCUTANEOUS
  Administered 2016-10-02 – 2016-10-03 (×2): 8 [IU] via SUBCUTANEOUS
  Administered 2016-10-03: 11 [IU] via SUBCUTANEOUS
  Filled 2016-10-02 (×4): qty 1

## 2016-10-02 NOTE — Plan of Care (Signed)
Problem: Education: Goal: Knowledge of [facility] Education information/materials will improve Outcome: Progressing VSS, free of falls during shift.  While ambulating to bathroom x1 assist, pt lost balance, caught himself before falling.  Enforced use of BSC for remainder of shift.  Denies pain.  Emesis x1, received PRN PO Zofran 4mg .  No other needs overnight.  Bed in low position, bed alarm on.  Call bell within reach, [place].

## 2016-10-02 NOTE — Progress Notes (Signed)

## 2016-10-02 NOTE — Progress Notes (Signed)
Zachary Lame, MD [facility]   [address] [place], [place] [address] Phone: [phone] Fax : [phone]   Subjective: The patient is much more alert today than he was described as yesterday. There is no sign of any worsening hepatic encephalopathy. The patient is speaking in full census. The wife reports the patient to be leaning to one side when he is trying to walk and has had slurred speech.   Objective: Vital signs in last 24 hours: Vitals:   10/01/16 2122 10/02/16 0448 10/02/16 0500 10/02/16 0857  BP: (!) 162/71 (!) 149/69  (!) 161/71  Pulse: 69 69  79  Resp: 19 18  20   Temp: 98.2 F (36.8 C) 99 F (37.2 C)  98 F (36.7 C)  TempSrc: Oral Oral  Oral  SpO2: 97% 97%  97%  Weight:   166 lb 8 oz (75.5 kg)   Height:       Weight change: 6 lb 8 oz (2.948 kg)  Intake/Output Summary (Last 24 hours) at 10/02/16 1213 Last data filed at 10/02/16 1200  Gross per 24 hour  Intake             2522 ml  Output                0 ml  Net             2522 ml     Exam: Heart:: Regular rate and rhythm, S1S2 present or without murmur or extra heart sounds Lungs: normal and clear to auscultation and percussion Abdomen: soft, nontender, normal bowel sounds   Lab Results: @LABTEST2 @ Micro Results: No results found for this or any previous visit (from the past 240 hour(s)). Studies/Results: Dg Chest 2 View  Result Date: 09/30/2016 CLINICAL DATA:  Altered mental status, history coronary artery disease post MI and stenting, diabetes mellitus, hypertension EXAM: CHEST  2 VIEW COMPARISON:  07/09/2013 FINDINGS: Upper normal heart size. Mediastinal contours and pulmonary vascularity normal. Atherosclerotic calcification aorta. Mild bronchitic changes without infiltrate, pleural effusion or pneumothorax. Suspected BILATERAL nipple shadows. Scattered endplate spur formation thoracic spine and mild osseous demineralization. IMPRESSION: Bronchitic changes without infiltrate. Question  BILATERAL nipple shadows ; repeat PA chest radiograph with nipple markers recommended to exclude pulmonary nodule. Electronically Signed   By: Lavonia Dana M.D.   On: 09/30/2016 15:35   Ct Head Wo Contrast  Result Date: 09/30/2016 CLINICAL DATA:  Confusion, disorientation, difficulty finding words, history cirrhosis, multiple myeloma, diabetes mellitus, coronary artery disease post MI and stenting, hypertension EXAM: CT HEAD WITHOUT CONTRAST TECHNIQUE: Contiguous axial images were obtained from the base of the skull through the vertex without intravenous contrast. Sagittal and coronal MPR images reconstructed from axial data set. COMPARISON:  None FINDINGS: Brain: Mild generalized atrophy. Normal ventricular morphology. No midline shift or mass effect. Otherwise normal appearance of brain parenchyma. No intracranial hemorrhage, mass lesion, or evidence acute infarction. No extra-axial fluid collections. Vascular: Atherosclerotic calcifications at skullbase Skull: Appears demineralized but intact Sinuses/Orbits: Clear Other: N/A IMPRESSION: No acute intracranial abnormalities. Electronically Signed   By: Lavonia Dana M.D.   On: 09/30/2016 15:41   Mr Brain Wo Contrast  Result Date: 10/02/2016 CLINICAL DATA:  Altered level of consciousness. Personal history of cirrhosis, anemia, coronary artery disease, multiple myeloma, and diabetes. EXAM: MRI HEAD WITHOUT CONTRAST TECHNIQUE: Multiplanar, multiecho pulse sequences of the brain and surrounding structures were obtained without intravenous contrast. COMPARISON:  CT head without contrast 09/30/2016. FINDINGS: Brain: The diffusion-weighted images demonstrate no acute  or subacute infarction. No acute hemorrhage or mass lesion is present. Periventricular and subcortical white matter changes are mildly advanced for age. Ventricles are of normal size. No significant extra-axial fluid collection is present. The brainstem and cerebellum are normal. Vascular: Flow is  present in the major intracranial arteries. Skull and upper cervical spine: The skullbase is within normal limits. Midline sagittal structures are normal. The craniocervical junction is normal. Marrow signal is within normal limits. Degenerative changes are present in the upper cervical spine. Sinuses/Orbits: The paranasal sinuses and mastoid air cells are clear. The globes and orbits are within normal limits. IMPRESSION: 1. No acute intracranial abnormality. 2. Periventricular and subcortical white matter changes are mildly advanced for age. Given the patient's other comorbidities, this likely reflects the sequela of chronic microvascular ischemia. Electronically Signed   By: San Morelle M.D.   On: 10/02/2016 11:21   Medications: I have reviewed the patient's current medications. Scheduled Meds: . docusate sodium  100 mg Oral BID  . heparin  5,000 Units Subcutaneous Q8H  . insulin aspart  0-15 Units Subcutaneous TID WC  . insulin aspart  0-5 Units Subcutaneous QHS  . lactulose  10 g Oral Once  . lactulose  20 g Oral QID  . lisinopril  20 mg Oral Daily  . metoprolol tartrate  25 mg Oral BID  . pantoprazole  40 mg Oral QAC breakfast  . rifaximin  550 mg Oral BID  . spironolactone  50 mg Oral Daily  . vitamin B-12  200 mcg Oral Daily  . vitamin C  1,000 mg Oral Daily  . zinc sulfate  220 mg Oral BID   Continuous Infusions: PRN Meds:.acetaminophen **OR** acetaminophen, bisacodyl, hydrALAZINE, ondansetron **OR** ondansetron (ZOFRAN) IV   Assessment: Principal Problem:   Hepatic encephalopathy (HCC) Active Problems:   Diabetes (HCC)   Multiple myeloma (HCC)   Cryptogenic cirrhosis ([facility])    Plan: This patient is noted to have cryptogenic cirrhosis and hepatic encephalopathy with ascites. The patient has been using his lactulose intermittently due to him reaching on Sundays. The patient is also on Xifaxan. The patient is doing much better at the present time. I have had a long  discussion with the family about prognosis and going further with his care. The patient will be continued on medication to decrease his incidence of hepatic encephalopathy. The patient also has an MRI of the head pending.   LOS: 2 days   Zachary Buck 10/02/2016, 12:13 PM

## 2016-10-02 NOTE — Progress Notes (Signed)
[facility] [place] NAME: Zachary Buck    MR#:  [id number]  DATE OF BIRTH:  1940-11-10  SUBJECTIVE:  CHIEF COMPLAINT:  Clinically better., mentating almost at his baseline Patient had 2 soft bowel movements since last night  REVIEW OF SYSTEMS:  CONSTITUTIONAL: No fever, fatigue or weakness.  EYES: No blurred or double vision.  EARS, NOSE, AND THROAT: No tinnitus or ear pain.  RESPIRATORY: No cough, shortness of breath, wheezing or hemoptysis.  CARDIOVASCULAR: No chest pain, orthopnea, edema.  GASTROINTESTINAL: No nausea, vomiting, diarrhea or abdominal pain.  GENITOURINARY: No dysuria, hematuria.  ENDOCRINE: No polyuria, nocturia,  HEMATOLOGY: No anemia, easy bruising or bleeding SKIN: No rash or lesion. MUSCULOSKELETAL: No joint pain or arthritis.   NEUROLOGIC: No tingling, numbness, weakness.  PSYCHIATRY: No anxiety or depression.   DRUG ALLERGIES:   Allergies  Allergen Reactions  . Niaspan [Niacin Er] Other (See Comments)    Just does not want to take     VITALS:  Blood pressure (!) 161/71, pulse 79, temperature 98 F (36.7 C), temperature source Oral, resp. rate 20, height 5' 9"  (1.753 m), weight 75.5 kg (166 lb 8 oz), SpO2 97 %.  PHYSICAL EXAMINATION:  GENERAL:  76 y.o.-year-old patient lying in the bed with no acute distress.  EYES: Pupils equal, round, reactive to light and accommodation. No scleral icterus. Extraocular muscles intact.  HEENT: Head atraumatic, normocephalic. Oropharynx and nasopharynx clear.  NECK:  Supple, no jugular venous distention. No thyroid enlargement, no tenderness.  LUNGS: Normal breath sounds bilaterally, no wheezing, rales,rhonchi or crepitation. No use of accessory muscles of respiration.  CARDIOVASCULAR: S1, S2 normal. No murmurs, rubs, or gallops.  ABDOMEN: Soft, nontender, nondistended. Bowel sounds present. EXTREMITIES: No pedal edema, cyanosis, or clubbing.   NEUROLOGIC: Cranial nerves II through XII are intact. Muscle strength at his baseline in all extremities. Sensation intact. Gait not checked.  PSYCHIATRIC: The patient is alert and oriented x2- 3. Disoriented to time SKIN: No obvious rash, lesion, or ulcer.    LABORATORY PANEL:   CBC  Recent Labs Lab 10/02/16 0454  WBC 6.8  HGB 11.1*  HCT 32.4*  PLT 77*   ------------------------------------------------------------------------------------------------------------------  Chemistries   Recent Labs Lab 10/02/16 0454  NA 138  K 3.9  CL 111  CO2 22  GLUCOSE 258*  BUN 17  CREATININE 1.15  CALCIUM 9.6  AST 27  ALT 27  ALKPHOS 63  BILITOT 1.6*   ------------------------------------------------------------------------------------------------------------------  Cardiac Enzymes  Recent Labs Lab 09/30/16 1427  TROPONINI <0.03   ------------------------------------------------------------------------------------------------------------------  RADIOLOGY:  Dg Chest 2 View  Result Date: 09/30/2016 CLINICAL DATA:  Altered mental status, history coronary artery disease post MI and stenting, diabetes mellitus, hypertension EXAM: CHEST  2 VIEW COMPARISON:  07/09/2013 FINDINGS: Upper normal heart size. Mediastinal contours and pulmonary vascularity normal. Atherosclerotic calcification aorta. Mild bronchitic changes without infiltrate, pleural effusion or pneumothorax. Suspected BILATERAL nipple shadows. Scattered endplate spur formation thoracic spine and mild osseous demineralization. IMPRESSION: Bronchitic changes without infiltrate. Question BILATERAL nipple shadows ; repeat PA chest radiograph with nipple markers recommended to exclude pulmonary nodule. Electronically Signed   By: Lavonia Dana M.D.   On: 09/30/2016 15:35   Ct Head Wo Contrast  Result Date: 09/30/2016 CLINICAL DATA:  Confusion, disorientation, difficulty finding words, history cirrhosis, multiple myeloma,  diabetes mellitus, coronary artery disease post MI and stenting, hypertension EXAM: CT HEAD WITHOUT CONTRAST TECHNIQUE: Contiguous axial images were obtained from the base of  the skull through the vertex without intravenous contrast. Sagittal and coronal MPR images reconstructed from axial data set. COMPARISON:  None FINDINGS: Brain: Mild generalized atrophy. Normal ventricular morphology. No midline shift or mass effect. Otherwise normal appearance of brain parenchyma. No intracranial hemorrhage, mass lesion, or evidence acute infarction. No extra-axial fluid collections. Vascular: Atherosclerotic calcifications at skullbase Skull: Appears demineralized but intact Sinuses/Orbits: Clear Other: N/A IMPRESSION: No acute intracranial abnormalities. Electronically Signed   By: Lavonia Dana M.D.   On: 09/30/2016 15:41   Mr Brain Wo Contrast  Result Date: 10/02/2016 CLINICAL DATA:  Altered level of consciousness. Personal history of cirrhosis, anemia, coronary artery disease, multiple myeloma, and diabetes. EXAM: MRI HEAD WITHOUT CONTRAST TECHNIQUE: Multiplanar, multiecho pulse sequences of the brain and surrounding structures were obtained without intravenous contrast. COMPARISON:  CT head without contrast 09/30/2016. FINDINGS: Brain: The diffusion-weighted images demonstrate no acute or subacute infarction. No acute hemorrhage or mass lesion is present. Periventricular and subcortical white matter changes are mildly advanced for age. Ventricles are of normal size. No significant extra-axial fluid collection is present. The brainstem and cerebellum are normal. Vascular: Flow is present in the major intracranial arteries. Skull and upper cervical spine: The skullbase is within normal limits. Midline sagittal structures are normal. The craniocervical junction is normal. Marrow signal is within normal limits. Degenerative changes are present in the upper cervical spine. Sinuses/Orbits: The paranasal sinuses and mastoid  air cells are clear. The globes and orbits are within normal limits. IMPRESSION: 1. No acute intracranial abnormality. 2. Periventricular and subcortical white matter changes are mildly advanced for age. Given the patient's other comorbidities, this likely reflects the sequela of chronic microvascular ischemia. Electronically Signed   By: San Morelle M.D.   On: 10/02/2016 11:21    EKG:   Orders placed or performed in visit on 08/26/14  . 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 Ammonia levels are trending down. Continue lactulose ,xifaxan MRI brain -No acute abnormalities Follow-up with gastroenterology dr.Wohl - op as recommended  pancultures blood and urine are ordered per GI Dr. Payton Emerald recommendations patient is afebrile clinically improving   #Multiple myeloma-stage II, chronic iron deficiency Currently not on any treatments. Outpatient follow-up with Dr. 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 Monitor platelet count  PT consult recommending home health PT-    All the records are reviewed and case discussed with Care Management/Social Workerr. Management plans discussed with the patient, family and they are in agreement.  CODE STATUS: fc   TOTAL TIME TAKING CARE OF THIS PATIENT: 32 minutes.   POSSIBLE D/C IN 1-2 DAYS, DEPENDING ON CLINICAL CONDITION.  Note: This dictation was prepared with Dragon dictation along with smaller phrase technology. Any transcriptional errors that result from this process are unintentional.   [name] M.D on 10/02/2016 at 11:29 AM  Between 7am to 6pm - Pager - [phone] After 6pm go to [email or web address] - password [email or web address] [facility]  Office  [phone]  CC: Primary care physician; Einar Pheasant, MD

## 2016-10-02 NOTE — Progress Notes (Signed)
Inpatient Diabetes Program Recommendations  AACE/ADA: New Consensus Statement on Inpatient Glycemic Control (2015)  Target Ranges:  Prepandial:   less than 140 mg/dL      Peak postprandial:   less than 180 mg/dL (1-2 hours)      Critically ill patients:  140 - 180 mg/dL   Results for Zachary Buck, Zachary Buck (MRN [id number]) as of 10/02/2016 08:10  Ref. Range 10/01/2016 07:47 10/01/2016 11:28 10/01/2016 16:27 10/01/2016 22:13  Glucose-Capillary Latest Ref Range: 65 - 99 mg/dL 209 (H) 294 (H) 250 (H) 280 (H)   Results for Zachary Buck, Zachary Buck (MRN [id number]) as of 10/02/2016 08:10  Ref. Range 10/02/2016 07:25  Glucose-Capillary Latest Ref Range: 65 - 99 mg/dL 233 (H)     Admit with: Hepatic Encephalopathy  History: DM, Cirrhosis  Home DM Meds: Metformin 1000 mg BID       Glipizide 5 mg AM/ 10 mg PM  Current Insulin Orders: Novolog Sensitive Correction Scale/ SSI (0-9 units) TID AC      Metformin 500 mg BID      Glipizide 10 mg BID       MD- Please consider the following in-hospital insulin adjustments:  1. Please stop Metformin and Glipizide for now  2. Consider starting low dose basal insulin for patient in-hospital: Lantus 8 units daily (0.1 units/kg dosing based on weight of 75 kg)  3. Increase Novolog SSI to Moderate scale (0-15 units) TID AC + HS   4. Not sure Metformin is the best treatment option at home for patient given his cirrhosis, however, per PCP notes from office visit on 09/15/16, patient's PCP (Dr. Einar Pheasant) mentions in her notes that patient does not wish to start any other diabetes medications at this time.     --Will follow patient during hospitalization--  Wyn Quaker RN, MSN, CDE Diabetes Coordinator Inpatient Glycemic Control Team Team Pager: [phone] (8a-5p)

## 2016-10-02 NOTE — Addendum Note (Signed)
Addended by: Francella Solian on: 10/02/2016 12:11 PM   Modules accepted: Orders

## 2016-10-03 ENCOUNTER — Encounter: Payer: Self-pay | Admitting: Internal Medicine

## 2016-10-03 LAB — GLUCOSE, CAPILLARY
GLUCOSE-CAPILLARY: 258 mg/dL — AB (ref 65–99)
GLUCOSE-CAPILLARY: 328 mg/dL — AB (ref 65–99)

## 2016-10-03 MED ORDER — ZINC SULFATE 220 (50 ZN) MG PO CAPS: 220.0000 mg | ORAL_CAPSULE | Freq: Two times a day (BID) | ORAL | Status: AC

## 2016-10-03 MED ORDER — INSULIN ASPART 100 UNIT/ML ~~LOC~~ SOLN: 0.0000 [IU] | Freq: Three times a day (TID) | SUBCUTANEOUS | 1 refills | Status: DC

## 2016-10-03 MED ORDER — PANTOPRAZOLE SODIUM 40 MG PO TBEC: 40.0000 mg | DELAYED_RELEASE_TABLET | Freq: Every day | ORAL | 0 refills | Status: DC

## 2016-10-03 MED ORDER — METOPROLOL TARTRATE 25 MG PO TABS: 25.0000 mg | ORAL_TABLET | Freq: Two times a day (BID) | ORAL | 0 refills | Status: DC

## 2016-10-03 MED ORDER — INSULIN ASPART 100 UNIT/ML ~~LOC~~ SOLN
0.0000 [IU] | Freq: Three times a day (TID) | SUBCUTANEOUS | Status: DC
Start: 2016-10-03 — End: 2016-10-03

## 2016-10-03 MED ORDER — LACTULOSE 10 GM/15ML PO SOLN: 20.0000 g | Freq: Four times a day (QID) | ORAL | 0 refills | Status: DC

## 2016-10-03 NOTE — Progress Notes (Signed)
Pt for discharge  Home.  Alert/ no resp distress.  Discharge instructions gone over with pts wife.  presc given and discussed.home meds discussed.  Diet / activity and f/u discussed with wife . Verbalizes understanding.   Home via w/c at this time w/o c/o. Sl d/cd prio to discharge

## 2016-10-03 NOTE — Discharge Summary (Signed)
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 Anemia   . Atherosclerotic heart disease    s/p MI, s/p stent mid circumflex  . CAD (coronary artery disease)    Dr Nehemiah Massed, cardiologist  . Cirrhosis of liver ([place])   . Clavicle fracture   . Diabetes mellitus without complication ([place])    oral med  . Elevated blood sugar    02/03/16 and 02/09/16 had BS over 400.  otherwise running around 250  . Esophageal varices ([place])   . Hepatic encephalopathy ([place])   . Hypercholesterolemia   . Hypertension    controlled on meds  . Internal hemorrhoids   . Leg fracture   . Multiple myeloma ([place])   . Myocardial infarct ([place])   . Prostate enlargement   . Tubular adenoma of colon     HOSPITAL COURSE:   HPI: Zachary Buck is a 76 y.o. male has a past medical history significant for CAD s/p MI, DM, HTN, multiple myeloma, IDA, and cirrhosis now with 1-2 day hx of progressive weakness and confusion. In ER, head CT Ok but ammonia level >100. He is now admitted. No fever. Denies CP or SOB. No N/V/D.  #Hepatitic encephalopathy with history of cryptogenic liver cirrhosis Clinically improving. Ammonia levels are nml. Continue lactulose for soft BM ,xifaxan MRI brain -No acute abnormalities Follow-up with gastroenterology dr.Wohl - op as recommended  pancultures blood and  urine are ordered per GI Dr. Payton Emerald recommendations Blood cultures are negative so far, PCP to follow up on both blood cultures and pending urine culture results  patient is afebrile clinically improving   #Multiple myeloma-stage II, chronic iron deficiency Currently not on any treatments. Outpatient follow-up with Dr. 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 Monitor platelet count  PT consult recommending OP PT-   DISCHARGE CONDITIONS:   STABLE  CONSULTS OBTAINED:  Treatment Team:  [place], MD Lloyd [place], MD [name], MD   PROCEDURES  NONE   DRUG ALLERGIES:   Allergies  Allergen Reactions  . Niaspan [Niacin Er] Other (See Comments)    Just does not want to take     DISCHARGE MEDICATIONS:   Current Discharge Medication List    START taking these medications   Details  insulin aspart (NOVOLOG) 100 UNIT/ML injection Inject 0-9 Units into the skin 3 (three) times daily with meals. Qty: 100 mL, Refills: 1    metoprolol tartrate (LOPRESSOR) 25 MG tablet Take 1 tablet (25 mg total) by mouth 2 (two) times daily. Qty: 60 tablet, Refills: 0    pantoprazole (PROTONIX) 40  MG tablet Take 1 tablet (40 mg total) by mouth daily before breakfast. Qty: 30 tablet, Refills: 0    zinc sulfate 220 (50 Zn) MG capsule Take 1 capsule (220 mg total) by mouth 2 (two) times daily.      CONTINUE these medications which have CHANGED   Details  lactulose (CHRONULAC) 10 GM/15ML solution Take 30 mLs (20 g total) by mouth 4 (four) times daily. Qty: 946 mL, Refills: 0      CONTINUE these medications  which have NOT CHANGED   Details  Alpha-Lipoic Acid (LIPOIC ACID PO) Take 200 mg by mouth daily.     Ascorbic Acid (VITAMIN C) 1000 MG tablet Take 1,000 mg by mouth daily.    B Complex Vitamins (VITAMIN B COMPLEX) TABS Take by mouth daily.    blood glucose meter kit and supplies KIT Dispense based on patient and insurance preference. Use up to four times daily as directed. E11.9 Please dispense [employer] [place] meter and supplies. Qty: 1 each, Refills: 0    Cholecalciferol (VITAMIN D-3) 1000 UNITS CAPS Take by mouth.    Cinnamon 500 MG capsule Take 500 mg by mouth.    Coenzyme Q10 100 MG capsule Take 100 mg by mouth daily.     furosemide (LASIX) 20 MG tablet Take 1 tablet (20 mg total) by mouth 2 (two) times daily. Qty: 60 tablet, Refills: 3    glucose blood (ONE TOUCH ULTRA TEST) test strip Use as instructed Qty: 100 each, Refills: 12    Lancets (ONETOUCH ULTRASOFT) lancets Use as instructed Qty: 100 each, Refills: 12    lisinopril (PRINIVIL,ZESTRIL) 20 MG tablet TAKE ONE TABLET BY MOUTH EVERY DAY/ AM    metFORMIN (GLUCOPHAGE) 1000 MG tablet TAKE 1 TABLET BY MOUTH TWICE A DAY WITH A MEAL Qty: 180 tablet, Refills: 1    Omega-3 Fatty Acids (FISH OIL) 1000 MG CAPS Take 4,000 mg by mouth daily.     rifaximin (XIFAXAN) 550 MG TABS tablet Take 1 tablet (550 mg total) by mouth 2 (two) times daily. Qty: 60 tablet, Refills: 11    sildenafil (REVATIO) 20 MG tablet 3 to 5 tablets per day or as instructed    spironolactone (ALDACTONE) 50 MG tablet Take 1 tablet (50 mg total) by mouth daily. Qty: 30 tablet, Refills: 3    Theanine 100 MG CAPS Take by mouth daily.    triamcinolone cream (KENALOG) 0.1 % Apply topically 2 (two) times daily. Please avoid face and genitalia area.  Do not use in the same spot for more than 7-10 days in a row. Pharmacy changed to [facility]. Qty: 80 g, Refills: 0    Turmeric POWD 4,000 mg by Does not apply route daily.    vitamin B-12 (CYANOCOBALAMIN)  100 MCG tablet Take 200 mcg by mouth daily.    glipiZIDE (GLUCOTROL) 5 MG tablet Take 1 tablet (5 mg total) by mouth See admin instructions. Qty: 270 tablet, Refills: 1         DISCHARGE INSTRUCTIONS:   Follow-up with primary care physician in a week Follow-up with gastroenterology in a week  follow-up with oncology Dr. Mike Gip in 2 weeks Continue outpatient physical therapy   DIET:  Diabetic diet  DISCHARGE CONDITION:  Stable  ACTIVITY:  Activity as tolerated/ op pt  OXYGEN:  Home Oxygen: No.   Oxygen Delivery: room air  DISCHARGE LOCATION:  home   If you experience worsening of your admission symptoms, develop shortness of breath, life threatening emergency, suicidal or homicidal thoughts  you must seek medical attention immediately by calling 911 or calling your MD immediately  if symptoms less severe.  You Must read complete instructions/literature along with all the possible adverse reactions/side effects for all the Medicines you take and that have been prescribed to you. Take any new Medicines after you have completely understood and accpet all the possible adverse reactions/side effects.   Please note  You were cared for by a hospitalist during your hospital stay. If you have any questions about your discharge medications or the care you received while you were in the hospital after you are discharged, you can call the unit and asked to speak with the hospitalist on call if the hospitalist that took care of you is not available. Once you are discharged, your primary care physician will handle any further medical issues. Please note that NO REFILLS for any discharge medications will be authorized once you are discharged, as it is imperative that you return to your primary care physician (or establish a relationship with a primary care physician if you do not have one) for your aftercare needs so that they can reassess your need for medications and monitor your lab  values.     Today  Chief Complaint  Patient presents with  . Altered Mental Status   Patient is resting comfortably out of bed to chair. Denies any complaints. Wants to go home. Wife at bedside.  ROS:  CONSTITUTIONAL: Denies fevers, chills. Denies any fatigue, weakness.  EYES: Denies blurry vision, double vision, eye pain. EARS, NOSE, THROAT: Denies tinnitus, ear pain, hearing loss. RESPIRATORY: Denies cough, wheeze, shortness of breath.  CARDIOVASCULAR: Denies chest pain, palpitations, edema.  GASTROINTESTINAL: Denies nausea, vomiting, diarrhea, abdominal pain. Denies bright red blood per rectum. GENITOURINARY: Denies dysuria, hematuria. ENDOCRINE: Denies nocturia or thyroid problems. HEMATOLOGIC AND LYMPHATIC: Denies easy bruising or bleeding. SKIN: Denies rash or lesion. MUSCULOSKELETAL: Denies pain in neck, back, shoulder, knees, hips or arthritic symptoms.  NEUROLOGIC: Denies paralysis, paresthesias.  PSYCHIATRIC: Denies anxiety or depressive symptoms.   VITAL SIGNS:  Blood pressure (!) 169/80, pulse 68, temperature 97.7 F (36.5 C), temperature source Oral, resp. rate 20, height _0  (1.753 m), weight 76.6 kg (168 lb 12.8 oz), SpO2 96 %.  I/O:    Intake/Output Summary (Last 24 hours) at 10/03/16 1319 Last data filed at 10/03/16 0800  Gross per 24 hour  Intake              720 ml  Output              100 ml  Net              620 ml    PHYSICAL EXAMINATION:  GENERAL:  76 y.o.-year-old patient lying in the bed with no acute distress.  EYES: Pupils equal, round, reactive to light and accommodation. No scleral icterus. Extraocular muscles intact.  HEENT: Head atraumatic, normocephalic. Oropharynx and nasopharynx clear.  NECK:  Supple, no jugular venous distention. No thyroid enlargement, no tenderness.  LUNGS: Normal breath sounds bilaterally, no wheezing, rales,rhonchi or crepitation. No use of accessory muscles of respiration.  CARDIOVASCULAR: S1, S2 normal. No  murmurs, rubs, or gallops.  ABDOMEN: Soft, non-tender, non-distended. Bowel sounds present.  EXTREMITIES: No pedal edema, cyanosis, or clubbing.  NEUROLOGIC: Cranial nerves II through XII are intact. Muscle strength 5/5 in all extremities. Sensation intact. Gait not checked.  PSYCHIATRIC: The patient is alert and oriented x 3.  SKIN: No obvious rash, lesion, or ulcer.  DATA REVIEW:   CBC  Recent Labs Lab 10/02/16 0454  WBC 6.8  HGB 11.1*  HCT 32.4*  PLT 77*    Chemistries   Recent Labs Lab 10/02/16 0454  NA 138  K 3.9  CL 111  CO2 22  GLUCOSE 258*  BUN 17  CREATININE 1.15  CALCIUM 9.6  AST 27  ALT 27  ALKPHOS 63  BILITOT 1.6*    Cardiac Enzymes  Recent Labs Lab 09/30/16 1427  TROPONINI <0.03    Microbiology Results  Results for orders placed or performed during the hospital encounter of 09/30/16  CULTURE, BLOOD (ROUTINE X 2) w Reflex to ID Panel     Status: None (Preliminary result)   Collection Time: 10/02/16  9:25 AM  Result Value Ref Range Status   Specimen Description BLOOD RIGHT ARM  Final   Special Requests   Final    BOTTLES DRAWN AEROBIC AND ANAEROBIC Blood Culture adequate volume   Culture NO GROWTH < 24 HOURS  Final   Report Status PENDING  Incomplete  CULTURE, BLOOD (ROUTINE X 2) w Reflex to ID Panel     Status: None (Preliminary result)   Collection Time: 10/02/16  9:25 AM  Result Value Ref Range Status   Specimen Description BLOOD RIGHT HAND  Final   Special Requests   Final    BOTTLES DRAWN AEROBIC AND ANAEROBIC Blood Culture adequate volume   Culture NO GROWTH < 24 HOURS  Final   Report Status PENDING  Incomplete    RADIOLOGY:  Dg Chest 2 View  Result Date: 09/30/2016 CLINICAL DATA:  Altered mental status, history coronary artery disease post MI and stenting, diabetes mellitus, hypertension EXAM: CHEST  2 VIEW COMPARISON:  07/09/2013 FINDINGS: Upper normal heart size. Mediastinal contours and pulmonary vascularity normal.  Atherosclerotic calcification aorta. Mild bronchitic changes without infiltrate, pleural effusion or pneumothorax. Suspected BILATERAL nipple shadows. Scattered endplate spur formation thoracic spine and mild osseous demineralization. IMPRESSION: Bronchitic changes without infiltrate. Question BILATERAL nipple shadows ; repeat PA chest radiograph with nipple markers recommended to exclude pulmonary nodule. Electronically Signed   By: Lavonia Dana M.D.   On: 09/30/2016 15:35   Ct Head Wo Contrast  Result Date: 09/30/2016 CLINICAL DATA:  Confusion, disorientation, difficulty finding words, history cirrhosis, multiple myeloma, diabetes mellitus, coronary artery disease post MI and stenting, hypertension EXAM: CT HEAD WITHOUT CONTRAST TECHNIQUE: Contiguous axial images were obtained from the base of the skull through the vertex without intravenous contrast. Sagittal and coronal MPR images reconstructed from axial data set. COMPARISON:  None FINDINGS: Brain: Mild generalized atrophy. Normal ventricular morphology. No midline shift or mass effect. Otherwise normal appearance of brain parenchyma. No intracranial hemorrhage, mass lesion, or evidence acute infarction. No extra-axial fluid collections. Vascular: Atherosclerotic calcifications at skullbase Skull: Appears demineralized but intact Sinuses/Orbits: Clear Other: N/A IMPRESSION: No acute intracranial abnormalities. Electronically Signed   By: Lavonia Dana M.D.   On: 09/30/2016 15:41   Mr Brain Wo Contrast  Result Date: 10/02/2016 CLINICAL DATA:  Altered level of consciousness. Personal history of cirrhosis, anemia, coronary artery disease, multiple myeloma, and diabetes. EXAM: MRI HEAD WITHOUT CONTRAST TECHNIQUE: Multiplanar, multiecho pulse sequences of the brain and surrounding structures were obtained without intravenous contrast. COMPARISON:  CT head without contrast 09/30/2016. FINDINGS: Brain: The diffusion-weighted images demonstrate no acute or  subacute infarction. No acute hemorrhage or mass lesion is present. Periventricular and subcortical white matter changes are mildly advanced for age. Ventricles are of normal size. No significant extra-axial  fluid collection is present. The brainstem and cerebellum are normal. Vascular: Flow is present in the major intracranial arteries. Skull and upper cervical spine: The skullbase is within normal limits. Midline sagittal structures are normal. The craniocervical junction is normal. Marrow signal is within normal limits. Degenerative changes are present in the upper cervical spine. Sinuses/Orbits: The paranasal sinuses and mastoid air cells are clear. The globes and orbits are within normal limits. IMPRESSION: 1. No acute intracranial abnormality. 2. Periventricular and subcortical white matter changes are mildly advanced for age. Given the patient's other comorbidities, this likely reflects the sequela of chronic microvascular ischemia. Electronically Signed   By: San Morelle M.D.   On: 10/02/2016 11:21    EKG:   Orders placed or performed in visit on 08/26/14  . EKG 12-Lead      Management plans discussed with the patient, family and they are in agreement.  CODE STATUS:     Code Status Orders        Start     Ordered   09/30/16 1948  Full code  Continuous     09/30/16 1947    Code Status History    Date Active Date Inactive Code Status Order ID Comments User Context   11/10/2014 10:43 AM 11/11/2014  3:27 AM Full Code [id number]  Inez Catalina, MD HOV    Advance Directive Documentation     Most Recent Value  Type of Advance Directive  Healthcare Power of Attorney  Pre-existing out of facility DNR order (yellow form or pink MOST form)  -  "MOST" Form in Place?  -      TOTAL TIME TAKING CARE OF THIS PATIENT: 45  minutes.   Note: This dictation was prepared with Dragon dictation along with smaller phrase technology. Any transcriptional errors that result from this process  are unintentional.   _0 @  on 10/03/2016 at 1:19 PM  Between 7am to 6pm - Pager - [phone]  After 6pm go to [email or web address] - password [email or web address] [facility]  Office  [phone]  CC: Primary care physician; Einar Pheasant, MD

## 2016-10-03 NOTE — Care Management (Signed)
Re-evaluation from physical therapy completed. Recommending outpatient physical therapy. Mr. Granquist is in agreement. Referral signed by Dr. Margaretmary Eddy and faxed to [facility]. Discharge to home today per Dr. Margaretmary Eddy. Family will transport Shelbie Ammons RN MSN CCM Care Management [phone]

## 2016-10-03 NOTE — Care Management Important Message (Signed)
Important Message  Patient Details  Name: MARKHAM DUMLAO MRN: [id number] Date of Birth: 21-May-1940   Medicare Important Message Given:  Yes    Shelbie Ammons, RN 10/03/2016, 8:28 AM

## 2016-10-03 NOTE — Progress Notes (Signed)
Physical Therapy Treatment Patient Details Name: BEREKET GERNERT MRN: [id number] DOB: Jan 08, 1941 Today's Date: 10/03/2016    History of Present Illness CARLAS VANDYNE is a 76 y.o. male has a past medical history significant for CAD s/p MI, DM, HTN, multiple myeloma, IDA, and cirrhosis now with 1-2 day hx of progressive weakness and confusion. Patient diagnosed with hepatic encephalopathy with elevated ammonia levels;     PT Comments    Bed mobility without assist.  Pt able to ambulate x 2 around unit with out assistive device.  Occasional has [place]ot LOB to left but he is able to self correct.  Participated in exercises as described below.  BERG balance test given and results reviewed with pt and wife.  Pt encouraged to consider using SPC when in community but he seems generally resistant.  Does not want HHPT or OPPT.  Is more appropriate for OPPT if he is willing to participate.   Follow Up Recommendations  Outpatient PT     Equipment Recommendations  None recommended by PT    Recommendations for Other Services       Precautions / Restrictions Precautions Precautions: Fall Restrictions Weight Bearing Restrictions: No    Mobility  Bed Mobility Overal bed mobility: Modified Independent                Transfers Overall transfer level: Modified independent Equipment used: None Transfers: Sit to/from Stand Sit to Stand: Supervision            Ambulation/Gait Ambulation/Gait assistance: Min guard Ambulation Distance (Feet): 400 Feet Assistive device: None Gait Pattern/deviations: Staggering left   Gait velocity interpretation: Below normal speed for age/gender General Gait Details: unsteady at times but able to self recover   Stairs            Wheelchair Mobility    Modified Rankin (Stroke Patients Only)       Balance Overall balance assessment: Needs assistance Sitting-balance support: No upper extremity supported;Feet  supported Sitting balance-Leahy Scale: Good     Standing balance support: No upper extremity supported Standing balance-Leahy Scale: Fair                   Standardized Balance Assessment Standardized Balance Assessment : Berg Balance Test Berg Balance Test Sit to Stand: Able to stand without using hands and stabilize independently Standing Unsupported: Able to stand safely 2 minutes Sitting with Back Unsupported but Feet Supported on Floor or Stool: Able to sit safely and securely 2 minutes Transfers: Able to transfer safely, definite need of hands Standing Unsupported with Eyes Closed: Able to stand 10 seconds safely Standing Ubsupported with Feet Together: Able to place feet together independently and stand 1 minute safely From Standing, Reach Forward with Outstretched Arm: Can reach forward >12 cm safely (5") From Standing Position, Pick up Object from Floor: Able to pick up shoe safely and easily From Standing Position, Turn to Look Behind Over each Shoulder: Looks behind one side only/other side shows less weight shift Turn 360 Degrees: Able to turn 360 degrees safely in 4 seconds or less Standing Unsupported, Alternately Place Feet on Step/Stool: Able to stand independently and safely and complete 8 steps in 20 seconds Standing Unsupported, One Foot in Front: Able to plae foot ahead of the other independently and hold 30 seconds Standing on One Leg: Able to lift leg independently and hold 5-10 seconds        Cognition Arousal/Alertness: Awake/alert Behavior During Therapy: [facility] for tasks assessed/performed Overall Cognitive  Status: Within Functional Limits for tasks assessed                                        Exercises Other Exercises Other Exercises: higher level balance activities in hallway including side stepping left and right, backwards and tandem gait    General Comments        Pertinent Vitals/Pain Pain Assessment: No/denies pain     Home Living                      Prior Function            PT Goals (current goals can now be found in the care plan section) Progress towards PT goals: Progressing toward goals    Frequency    Min 2X/week      PT Plan      Co-evaluation              AM-PAC PT "6 Clicks" Daily Activity  Outcome Measure  Difficulty turning over in bed (including adjusting bedclothes, sheets and blankets)?: None Difficulty moving from lying on back to sitting on the side of the bed? : A Little Difficulty sitting down on and standing up from a chair with arms (e.g., wheelchair, bedside commode, etc,.)?: A Little Help needed moving to and from a bed to chair (including a wheelchair)?: None Help needed walking in hospital room?: A Little Help needed climbing 3-5 steps with a railing? : A Little 6 Click Score: 20    End of Session Equipment Utilized During Treatment: Gait belt Activity Tolerance: Patient tolerated treatment well;No increased pain Patient left: in chair;with call bell/phone within reach;with family/visitor present;with chair alarm set         Time: [phone] PT Time Calculation (min) (ACUTE ONLY): 37 min  Charges:  $Gait Training: 23-37 mins $Therapeutic Activity: 8-22 mins                    G Codes:       Chesley Noon, PTA 10/03/16, 9:42 AM

## 2016-10-03 NOTE — Progress Notes (Signed)
Inpatient Diabetes Program Recommendations  AACE/ADA: New Consensus Statement on Inpatient Glycemic Control (2015)  Target Ranges:  Prepandial:   less than 140 mg/dL      Peak postprandial:   less than 180 mg/dL (1-2 hours)      Critically ill patients:  140 - 180 mg/dL   Results for Zachary Buck, Zachary Buck (MRN [id number]) as of 10/03/2016 10:13  Ref. Range 10/02/2016 07:25 10/02/2016 11:49 10/02/2016 17:10 10/02/2016 21:20  Glucose-Capillary Latest Ref Range: 65 - 99 mg/dL 233 (H) 294 (H) 209 (H) 219 (H)   Results for Zachary Buck, Zachary Buck (MRN [id number]) as of 10/03/2016 10:13  Ref. Range 10/03/2016 07:35  Glucose-Capillary Latest Ref Range: 65 - 99 mg/dL 258 (H)    Admit with: Hepatic Encephalopathy  History: DM, Cirrhosis  Home DM Meds: Metformin 1000 mg BID                             Glipizide 5 mg AM/ 10 mg PM  Current Insulin Orders: Novolog Moderate Correction Scale/ SSI (0-15 units) TID AC + HS       MD- Please consider the following in-hospital insulin adjustments:  1. Consider starting low dose basal insulin for patient in-hospital: Lantus 8 units daily (0.1 units/kg dosing based on weight of 75 kg)  2. Not sure Metformin is the best treatment option at home for patient given his cirrhosis, however, per PCP notes from office visit on 09/15/16, patient's PCP (Dr. Einar Pheasant) mentions in her notes that patient does not wish to start any other diabetes medications at this time.     --Will follow patient during hospitalization--  Wyn Quaker RN, MSN, CDE Diabetes Coordinator Inpatient Glycemic Control Team Team Pager: [phone] (8a-5p)

## 2016-10-03 NOTE — Discharge Instructions (Signed)
Follow-up with primary care physician in a week Follow-up with gastroenterology Dr. Allen Norris in a week Follow-up with oncology Dr. Mike Gip in 2 weeks  and continue outpatient physical therapy   Hepatic Encephalopathy Hepatic encephalopathy is a loss of brain function from advanced liver disease. The effects of the condition depend on the type of liver damage and how severe it is. In some cases, hepatic encephalopathy can be reversed. What are the causes? The exact cause of hepatic encephalopathy is not known. What increases the risk? You have a higher risk of getting this condition if your liver is damaged. When the liver is damaged harmful substances called toxins can build up in the body. Certain toxins, such as ammonia, can harm your brain. Conditions that can cause liver damage include:  An infection.  Dehydration.  Intestinal bleeding.  Drinking too much alcohol.  Taking certain medicines, including tranquilizers, water pills (diuretics), antidepressants, or sleeping pills.  What are the signs or symptoms? Signs and symptoms may develop suddenly. Or, they may develop slowly and get worse gradually. Symptoms can range from mild to severe. Mild Hepatic Encephalopathy  Mild confusion.  Personality and mood changes.  Anxiety and agitation.  Drowsiness.  Loss of mental abilities.  Musty or sweet-smelling breath. Worsening or Severe Hepatic Encephalopathy  Slowed movement.  Slurred speech.  Extreme personality changes.  Disorientation.  Abnormal shaking or flapping of the hands.  Coma. How is this diagnosed? To make a diagnosis, your health care provider will do a physical exam. To rule out other causes of your signs and symptoms, he or she may order tests. You may have:  Blood tests. These may be done to check your ammonia level, measure how long it takes your blood to clot, and check for infection.  Liver function tests. These may be done to check how well your  liver is working.  MRI and CT scans. These may be done to check for a brain disorder.  Electroencephalogram (EEG). This may be done to measure the electrical activity in your brain.  How is this treated? The first step in treatment is identifying and treating possible triggers. The next step is involves taking medicine to lower the level of toxins in the body and to prevent ammonia from building up. You may need to take:  Antibiotics to reduce the ammonia-producing bacteria in your gut.  Lactulose to help flush ammonia from the gut.  Follow these instructions at home: Eating and drinking  Follow a low-protein diet that includes plenty of fruits, vegetables, and whole grains, as directed by your health care provider. Ammonia is produced when you digest high-protein foods.  Work with a Microbiologist or with your health care provider to make sure you are getting the right balance of protein and minerals.  Drink enough fluids to keep your urine clear or pale yellow. Drinking plenty of water helps prevent constipation.  Do not drink alcohol or use illegal drugs. Medicines  Only take medicine as directed by your health care provider.  If you were prescribed an antibiotic medicine, finish it all even if you start to feel better.  Do not start any new medicines, including over-the-counter medicines, without first checking with your health care provider. Contact a health care provider if:  You have new symptoms.  Your symptoms change.  Your symptoms get worse.  You have a fever.  You are constipated.  You have persistent nausea, vomiting, or diarrhea. Get help right away if:  You become very confused or drowsy.  You vomit blood or material that looks like coffee grounds.  Your stool is bloody or black or looks like tar. This information is not intended to replace advice given to you by your health care provider. Make sure you discuss any questions you have with your health care  provider. Document Released: 04/18/2006 Document Revised: 07/15/2015 Document Reviewed: 09/24/2013 Elsevier Interactive Patient Education  2018 [employer].

## 2016-10-04 ENCOUNTER — Inpatient Hospital Stay: Payer: PPO | Attending: Hematology and Oncology

## 2016-10-04 DIAGNOSIS — D509 Iron deficiency anemia, unspecified: Secondary | ICD-10-CM | POA: Diagnosis not present

## 2016-10-04 DIAGNOSIS — C9000 Multiple myeloma not having achieved remission: Secondary | ICD-10-CM | POA: Diagnosis not present

## 2016-10-04 DIAGNOSIS — K7290 Hepatic failure, unspecified without coma: Secondary | ICD-10-CM | POA: Diagnosis not present

## 2016-10-04 DIAGNOSIS — J61 Pneumoconiosis due to asbestos and other mineral fibers: Secondary | ICD-10-CM | POA: Insufficient documentation

## 2016-10-04 DIAGNOSIS — K648 Other hemorrhoids: Secondary | ICD-10-CM | POA: Diagnosis not present

## 2016-10-04 DIAGNOSIS — K7469 Other cirrhosis of liver: Secondary | ICD-10-CM | POA: Insufficient documentation

## 2016-10-04 DIAGNOSIS — Z77090 Contact with and (suspected) exposure to asbestos: Secondary | ICD-10-CM | POA: Insufficient documentation

## 2016-10-04 DIAGNOSIS — K317 Polyp of stomach and duodenum: Secondary | ICD-10-CM | POA: Diagnosis not present

## 2016-10-04 DIAGNOSIS — J849 Interstitial pulmonary disease, unspecified: Secondary | ICD-10-CM | POA: Diagnosis not present

## 2016-10-04 DIAGNOSIS — N400 Enlarged prostate without lower urinary tract symptoms: Secondary | ICD-10-CM | POA: Diagnosis not present

## 2016-10-04 DIAGNOSIS — I10 Essential (primary) hypertension: Secondary | ICD-10-CM | POA: Insufficient documentation

## 2016-10-04 DIAGNOSIS — J470 Bronchiectasis with acute lower respiratory infection: Secondary | ICD-10-CM | POA: Diagnosis not present

## 2016-10-04 DIAGNOSIS — I252 Old myocardial infarction: Secondary | ICD-10-CM | POA: Insufficient documentation

## 2016-10-04 DIAGNOSIS — I864 Gastric varices: Secondary | ICD-10-CM | POA: Insufficient documentation

## 2016-10-04 DIAGNOSIS — D122 Benign neoplasm of ascending colon: Secondary | ICD-10-CM | POA: Diagnosis not present

## 2016-10-04 DIAGNOSIS — Z955 Presence of coronary angioplasty implant and graft: Secondary | ICD-10-CM | POA: Insufficient documentation

## 2016-10-04 DIAGNOSIS — I2510 Atherosclerotic heart disease of native coronary artery without angina pectoris: Secondary | ICD-10-CM | POA: Insufficient documentation

## 2016-10-04 DIAGNOSIS — K760 Fatty (change of) liver, not elsewhere classified: Secondary | ICD-10-CM | POA: Insufficient documentation

## 2016-10-04 DIAGNOSIS — D125 Benign neoplasm of sigmoid colon: Secondary | ICD-10-CM | POA: Insufficient documentation

## 2016-10-04 DIAGNOSIS — R161 Splenomegaly, not elsewhere classified: Secondary | ICD-10-CM | POA: Diagnosis not present

## 2016-10-04 DIAGNOSIS — D696 Thrombocytopenia, unspecified: Secondary | ICD-10-CM | POA: Insufficient documentation

## 2016-10-04 DIAGNOSIS — K766 Portal hypertension: Secondary | ICD-10-CM | POA: Insufficient documentation

## 2016-10-04 DIAGNOSIS — Z79899 Other long term (current) drug therapy: Secondary | ICD-10-CM | POA: Insufficient documentation

## 2016-10-04 DIAGNOSIS — E7800 Pure hypercholesterolemia, unspecified: Secondary | ICD-10-CM | POA: Diagnosis not present

## 2016-10-04 DIAGNOSIS — D500 Iron deficiency anemia secondary to blood loss (chronic): Secondary | ICD-10-CM

## 2016-10-04 DIAGNOSIS — I8510 Secondary esophageal varices without bleeding: Secondary | ICD-10-CM | POA: Diagnosis not present

## 2016-10-04 LAB — CBC WITH DIFFERENTIAL/PLATELET
Basophils Absolute: 0 10*3/uL (ref 0–0.1)
Basophils Relative: 1 %
Eosinophils Absolute: 0.4 10*3/uL (ref 0–0.7)
Eosinophils Relative: 8 %
HCT: 31.8 % — ABNORMAL LOW (ref 40.0–52.0)
Hemoglobin: 10.9 g/dL — ABNORMAL LOW (ref 13.0–18.0)
Lymphocytes Relative: 28 %
Lymphs Abs: 1.3 10*3/uL (ref 1.0–3.6)
MCH: 29.8 pg (ref 26.0–34.0)
MCHC: 34.4 g/dL (ref 32.0–36.0)
MCV: 86.6 fL (ref 80.0–100.0)
Monocytes Absolute: 0.6 10*3/uL (ref 0.2–1.0)
Monocytes Relative: 12 %
Neutro Abs: 2.4 10*3/uL (ref 1.4–6.5)
Neutrophils Relative %: 51 %
Platelets: 73 10*3/uL — ABNORMAL LOW (ref 150–440)
RBC: 3.67 MIL/uL — ABNORMAL LOW (ref 4.40–5.90)
RDW: 16 % — ABNORMAL HIGH (ref 11.5–14.5)
WBC: 4.7 10*3/uL (ref 3.8–10.6)

## 2016-10-04 LAB — URINE CULTURE
Culture: NO GROWTH
Special Requests: NORMAL

## 2016-10-04 LAB — COMPREHENSIVE METABOLIC PANEL
ALT: 49 U/L (ref 17–63)
AST: 49 U/L — ABNORMAL HIGH (ref 15–41)
Albumin: 2.9 g/dL — ABNORMAL LOW (ref 3.5–5.0)
Alkaline Phosphatase: 91 U/L (ref 38–126)
Anion gap: 4 — ABNORMAL LOW (ref 5–15)
BUN: 24 mg/dL — ABNORMAL HIGH (ref 6–20)
CO2: 22 mmol/L (ref 22–32)
Calcium: 9.3 mg/dL (ref 8.9–10.3)
Chloride: 107 mmol/L (ref 101–111)
Creatinine, Ser: 1.32 mg/dL — ABNORMAL HIGH (ref 0.61–1.24)
GFR calc Af Amer: 59 mL/min — ABNORMAL LOW (ref >60)
GFR calc non Af Amer: 51 mL/min — ABNORMAL LOW (ref >60)
Glucose, Bld: 343 mg/dL — ABNORMAL HIGH (ref 65–99)
Potassium: 4.5 mmol/L (ref 3.5–5.1)
Sodium: 133 mmol/L — ABNORMAL LOW (ref 135–145)
Total Bilirubin: 1.2 mg/dL (ref 0.3–1.2)
Total Protein: 7.6 g/dL (ref 6.5–8.1)

## 2016-10-04 LAB — FERRITIN: Ferritin: 53 ng/mL (ref 24–336)

## 2016-10-04 LAB — MISC LABCORP TEST (SEND OUT)

## 2016-10-04 MED ORDER — INSULIN ASPART 100 UNIT/ML ~~LOC~~ SOLN: 0.0000 [IU] | Freq: Three times a day (TID) | SUBCUTANEOUS | 1 refills | Status: DC

## 2016-10-04 MED ORDER — GLUCOSE BLOOD VI STRP: ORAL_STRIP | 3 refills | Status: DC

## 2016-10-04 NOTE — Telephone Encounter (Signed)
Patient prefers to stay with current insulin plan and to continue with Physical therapy form faxed as requested . Patient stated he would rather follow with Dr.Corcoran and Dr. Allen Norris for HFU at this time and will see PCP in November for regular follow up.

## 2016-10-04 NOTE — Telephone Encounter (Signed)
We will monitor his sugars and amount of insulin he is requiring.  May be able to put him on some standing long term insulin.  From my understanding, insurance will not cover at this time.  Also, I have signed form for PT.  If he is agreeable, we can fax.  Just let me know if I need to do anything more.

## 2016-10-04 NOTE — Telephone Encounter (Signed)
Patient DPR ( wife) notified of PCP orders and voiced understanding new script of insulin sent to pharmacy a long with test strips, patient will call back as to need for HFU. Patient wife stated she knew how to give the insulin and is ok with administering . Patient DPr ask if patient would qualify for new needles glucometer advised her usually for patient that are permanently on insulin but if PCP approved we could try and order. Please advise.

## 2016-10-04 NOTE — Telephone Encounter (Signed)
Thank you Juliann Pulse for all of your help.  Let me know if I need to do anything.

## 2016-10-04 NOTE — Telephone Encounter (Signed)
Patient on TCM list for this morning will need appointment I reviewed the discharge summary for sliding scale I have pasted below the information I  could find.   insulin aspart (NOVOLOG) 100 UNIT/ML injection Inject 0-9 Units into the skin 3 (three) times daily with meals. Qty: 100 mL, Refills: 1   For blood sugar less than 120 no insulin is needed  BS 121-150 - give1 unit;  Blood sugar 151-200- GIVE 2 units  201-250-give 3 units  251-300-give 5 units  301-350-give 7 units  351-400-give 9 units  Blood sugar greater than 400 call M.D.

## 2016-10-04 NOTE — Telephone Encounter (Signed)
He can remain off the oral medications for now.  We can try a trial of sliding scale insulin.  He would need to check his sugars before meals.  Can use the attached sliding scale.  I do not want him giving himself insulin before bed - just with meals, so would need to check sugars before each meal.  If needs hospital f/u with me, can find a spot for him.  (I know he needs f/u with Dr Mike Gip and Dr Allen Norris).  Will need rx sent in for sliding scale insulin and needles if does not have.  May also need instructions on how to give insulin, etc.  Just let me know if I need to do anything.

## 2016-10-05 ENCOUNTER — Inpatient Hospital Stay: Payer: PPO

## 2016-10-05 ENCOUNTER — Telehealth: Payer: Self-pay | Admitting: Gastroenterology

## 2016-10-05 ENCOUNTER — Inpatient Hospital Stay (HOSPITAL_BASED_OUTPATIENT_CLINIC_OR_DEPARTMENT_OTHER): Payer: PPO | Admitting: Hematology and Oncology

## 2016-10-05 ENCOUNTER — Other Ambulatory Visit: Payer: PPO

## 2016-10-05 VITALS — BP 145/78 | HR 69 | Temp 97.3°F | Resp 18 | Wt 169.3 lb

## 2016-10-05 DIAGNOSIS — K7682 Hepatic encephalopathy: Secondary | ICD-10-CM

## 2016-10-05 DIAGNOSIS — K7469 Other cirrhosis of liver: Secondary | ICD-10-CM | POA: Diagnosis not present

## 2016-10-05 DIAGNOSIS — C9000 Multiple myeloma not having achieved remission: Secondary | ICD-10-CM

## 2016-10-05 DIAGNOSIS — Z79899 Other long term (current) drug therapy: Secondary | ICD-10-CM

## 2016-10-05 DIAGNOSIS — K7290 Hepatic failure, unspecified without coma: Secondary | ICD-10-CM

## 2016-10-05 DIAGNOSIS — D500 Iron deficiency anemia secondary to blood loss (chronic): Secondary | ICD-10-CM

## 2016-10-05 DIAGNOSIS — D509 Iron deficiency anemia, unspecified: Secondary | ICD-10-CM | POA: Diagnosis not present

## 2016-10-05 LAB — PROTEIN ELECTROPHORESIS, SERUM
A/G Ratio: 0.7 (ref 0.7–1.7)
Albumin ELP: 2.9 g/dL (ref 2.9–4.4)
Alpha-1-Globulin: 0.2 g/dL (ref 0.0–0.4)
Alpha-2-Globulin: 0.6 g/dL (ref 0.4–1.0)
Beta Globulin: 0.8 g/dL (ref 0.7–1.3)
Gamma Globulin: 2.7 g/dL — ABNORMAL HIGH (ref 0.4–1.8)
Globulin, Total: 4.3 g/dL — ABNORMAL HIGH (ref 2.2–3.9)
M-Spike, %: 2.5 g/dL — ABNORMAL HIGH
Total Protein ELP: 7.2 g/dL (ref 6.0–8.5)

## 2016-10-05 NOTE — Progress Notes (Signed)
Patient was recently hospitalized for 4 days due to  increased ammonia level.  Patient is accompanied by his wife today.  When reviewing his medication the wife states she will not give him the metoprolol that was prescribed.  Patient also was placed on sliding scale of Lantus, however, states she will not give it to him either.  Patient has very little knowledge of his medications as the wife administers his medications.

## 2016-10-05 NOTE — Telephone Encounter (Signed)
Patients wife has a question to ask. Please call

## 2016-10-05 NOTE — Progress Notes (Signed)
[facility] day:  10/05/16  Chief Complaint: Zachary Buck is a 76 y.o. male  with stage II multiple myeloma and iron deficiency anemia who is seen for 2 month assessment.  HPI: The patient was last seen in the medical oncology clinic on 08/04/2016.  At that time, patient continued to report chronic fatigue.  Past medical history was significant for hepatic encephalopathy for which patient requires lactulose therapy; takes as directed. Hemoglobin was 11.9 and platelets 95,000.  SPEP was stable; M-spike was 2.7.  Ferritin was 16. He received Venofer on 07/04/2016, 07/11/2016, and 07/18/2016.  Since his last visit patient was admitted to [facility] from 09/30/2016 - 10/03/2016 with altered mental status secondary to hepatic encephalopathy.  While in the hospital patient had CT scan and MRI scan of his brain to rule out organic causes of worsening confusion. Imaging and infectious workup done while patient was admitted was all normal, and his confusion was felt to be a direct result of his hepatic encephalopathy.  Admitting ammonia level was 103.   Patient was seen by Dr. Allen Norris during his admission, who felt planned to keep patient on Xifaxan and lactulose therapy in efforts to control patient's cirrhosis and resulting hepatic encephalopathy.  Lactulose was increased to 30cc four times a day. Dr. Grayland Ormond was also consulted and recommended continued follow up care in the cancer center. Patient was discharged from the hospital on Metoprolol and Lantus. Patient's wife states, "I have not been giving him those medications. He doesn't need them".  Diet is good; consists of daily fruits, vegetables, and lugumes. He rarely eats red meat.  He is on several herbal products.  Bone survey on 09/15/2016 revealed no focal lytic lesion or acute bony abnormality.  Symptomatically, patient reports that he is feeling good today. Patient is oriented and back to baseline.   Patient has no physical complaints of anything today.    Past Medical History:  Diagnosis Date  . Anemia   . Atherosclerotic heart disease    s/p MI, s/p stent mid circumflex  . CAD (coronary artery disease)    Dr Nehemiah Massed, cardiologist  . Cirrhosis of liver ([place])   . Clavicle fracture   . Diabetes mellitus without complication ([place])    oral med  . Elevated blood sugar    02/03/16 and 02/09/16 had BS over 400.  otherwise running around 250  . Esophageal varices ([place])   . Hepatic encephalopathy ([place])   . Hypercholesterolemia   . Hypertension    controlled on meds  . Internal hemorrhoids   . Leg fracture   . Multiple myeloma ([place])   . Myocardial infarct ([place])   . Prostate enlargement   . Tubular adenoma of colon     Past Surgical History:  Procedure Laterality Date  . COLONOSCOPY WITH PROPOFOL N/A 03/04/2015   Procedure: COLONOSCOPY WITH PROPOFOL;  Surgeon: Jerene Bears, MD;  Location: [facility];  Service: Gastroenterology;  Laterality: N/A;  . COLONOSCOPY WITH PROPOFOL N/A 12/09/2015   Procedure: COLONOSCOPY WITH PROPOFOL;  Surgeon: [name], MD;  Location: [place];  Service: Endoscopy;  Laterality: N/A;  DIABETIC-ORAL MEDS  . CORONARY ANGIOPLASTY WITH STENT PLACEMENT     2 stents  . ESOPHAGOGASTRODUODENOSCOPY (EGD) WITH PROPOFOL N/A 03/04/2015   Procedure: ESOPHAGOGASTRODUODENOSCOPY (EGD) WITH PROPOFOL;  Surgeon: Jerene Bears, MD;  Location: [facility];  Service: Gastroenterology;  Laterality: N/A;  . ESOPHAGOGASTRODUODENOSCOPY (EGD) WITH PROPOFOL N/A 12/09/2015   Procedure: ESOPHAGOGASTRODUODENOSCOPY (EGD)  WITH PROPOFOL;  Surgeon: [name], MD;  Location: [place];  Service: Endoscopy;  Laterality: N/A;  . ESOPHAGOGASTRODUODENOSCOPY (EGD) WITH PROPOFOL N/A 12/21/2015   Procedure: ESOPHAGOGASTRODUODENOSCOPY (EGD) WITH PROPOFOL;  Surgeon: [name], MD;  Location: [facility];  Service: Endoscopy;  Laterality: N/A;  .  ESOPHAGOGASTRODUODENOSCOPY (EGD) WITH PROPOFOL N/A 02/24/2016   Procedure: ESOPHAGOGASTRODUODENOSCOPY (EGD) WITH PROPOFOL;  Surgeon: [name], MD;  Location: [place];  Service: Endoscopy;  Laterality: N/A;  diabetic - oral med  . POLYPECTOMY N/A 12/09/2015   Procedure: POLYPECTOMY;  Surgeon: [name], MD;  Location: [place];  Service: Endoscopy;  Laterality: N/A;  . TONSILLECTOMY      Family History  Problem Relation Age of Onset  . Aneurysm Father 46       of the heart  . Heart attack Father   . Diabetes Mother     Social History:  reports that he has never smoked. He has never used smokeless tobacco. He reports that he does not drink alcohol or use drugs.  Patient denies any exposure to radiation or toxins.  He is a Company secretary.  He lives in [place].  The patient is accompanied by his wife, [name], today.  Allergies:  Allergies  Allergen Reactions  . Niaspan [Niacin Er] Other (See Comments)    Just does not want to take     Current Medications: Current Outpatient Prescriptions  Medication Sig Dispense Refill  . Alpha-Lipoic Acid (LIPOIC ACID PO) Take 200 mg by mouth daily.     . Ascorbic Acid (VITAMIN C) 1000 MG tablet Take 1,000 mg by mouth daily.    . B Complex Vitamins (VITAMIN B COMPLEX) TABS Take by mouth daily.    . blood glucose meter kit and supplies KIT Dispense based on patient and insurance preference. Use up to four times daily as directed. E11.9 Please dispense Freestyle freedom meter and supplies. 1 each 0  . Cholecalciferol (VITAMIN D-3) 1000 UNITS CAPS Take by mouth.    . Cinnamon 500 MG capsule Take 500 mg by mouth.    . Coenzyme Q10 100 MG capsule Take 100 mg by mouth daily.     . furosemide (LASIX) 20 MG tablet Take 1 tablet (20 mg total) by mouth 2 (two) times daily. 60 tablet 3  . glipiZIDE (GLUCOTROL) 5 MG tablet Take 1 tablet (5 mg total) by mouth See admin instructions. 270 tablet 1  . glucose blood (ONE TOUCH ULTRA TEST) test  strip Use as instructed 100 each 12  . glucose blood test strip Freestyle lite test strips, check blood sugar three times daily: Dx 250.00 100 each 3  . insulin aspart (NOVOLOG) 100 UNIT/ML injection Inject 0-9 Units into the skin 3 (three) times daily with meals. 100 mL 1  . lactulose (CHRONULAC) 10 GM/15ML solution Take 30 mLs (20 g total) by mouth 4 (four) times daily. 946 mL 0  . Lancets (ONETOUCH ULTRASOFT) lancets Use as instructed 100 each 12  . lisinopril (PRINIVIL,ZESTRIL) 20 MG tablet TAKE ONE TABLET BY MOUTH EVERY DAY/ AM    . metFORMIN (GLUCOPHAGE) 1000 MG tablet TAKE 1 TABLET BY MOUTH TWICE A DAY WITH A MEAL 180 tablet 1  . Omega-3 Fatty Acids (FISH OIL) 1000 MG CAPS Take 4,000 mg by mouth daily.     . rifaximin (XIFAXAN) 550 MG TABS tablet Take 1 tablet (550 mg total) by mouth 2 (two) times daily. 60 tablet 11  . sildenafil (REVATIO) 20 MG tablet 3 to 5  tablets per day or as instructed    . spironolactone (ALDACTONE) 50 MG tablet Take 1 tablet (50 mg total) by mouth daily. 30 tablet 3  . Theanine 100 MG CAPS Take by mouth daily.    Marland Kitchen triamcinolone cream (KENALOG) 0.1 % Apply topically 2 (two) times daily. Please avoid face and genitalia area.  Do not use in the same spot for more than 7-10 days in a row. Pharmacy changed to [facility] 80 g 0  . Turmeric POWD 4,000 mg by Does not apply route daily.    . vitamin B-12 (CYANOCOBALAMIN) 100 MCG tablet Take 200 mcg by mouth daily.    Marland Kitchen zinc sulfate 220 (50 Zn) MG capsule Take 1 capsule (220 mg total) by mouth 2 (two) times daily.     No current facility-administered medications for this visit.    Facility-Administered Medications Ordered in Other Visits  Medication Dose Route Frequency Provider Last Rate Last Dose  . alteplase (CATHFLO ACTIVASE) injection 2 mg  2 mg Intracatheter Once PRN Nolon Stalls C, MD      . heparin lock flush 100 unit/mL  500 Units Intracatheter Once PRN Mike Gip, Melissa C, MD      . heparin lock flush  100 unit/mL  250 Units Intracatheter Once PRN Mike Gip, Melissa C, MD      . sodium chloride 0.9 % injection 10 mL  10 mL Intracatheter PRN Corcoran, Melissa C, MD      . sodium chloride 0.9 % injection 3 mL  3 mL Intravenous Once PRN Lequita Asal, MD        Review of Systems:  GENERAL:  No complaints.  No fevers or sweats.  Weight stable.  PERFORMANCE STATUS (ECOG):  1 HEENT:  No visual changes, runny nose, sore throat, mouth sores or tenderness. Lungs: No shortness of breath or cough.  No hemoptysis. Cardiac:  No chest pain, palpitations, orthopnea, or PND. GI:  No nausea, vomiting, constipation, or hematochezia.  GU:  No urgency, frequency, dysuria, or hematuria. Musculoskeletal:  No back pain.  No joint pain.  No muscle tenderness. Extremities:  No pain or swelling. Skin:  No rashes or skin changes. Neuro:  Short term memory issues.  Interval encephalopathy resolved with lactulose.  No headache, numbness or weakness, balance or coordination issues. Endocrine:  Diabetes.  No thyroid issues, hot flashes or night sweats. Psych:  No mood changes, depression or anxiety. Pain:  No pain. Review of systems:  All other systems reviewed and found to be negative.  Physical Exam: BP (!) 145/78 (BP Location: Left Arm, Patient Position: Sitting)   Pulse 69   Temp (!) 97.3 F (36.3 C) (Tympanic)   Resp 18   Wt 169 lb 5 oz (76.8 kg)   BMI 25.00 kg/m   GENERAL:  Well developed, well nourished, gentleman sitting comfortably in the exam room in no acute distress. MENTAL STATUS:  Alert and oriented to person, place and time. HEAD:  Pearline Cables hair.  Normocephalic, atraumatic, face symmetric, no Cushingoid features. EYES:  Glasses.  Blue eyes.  Pupils equal round and reactive to light and accomodation.  No conjunctivitis or scleral icterus. ENT:  Oropharynx clear without lesion.  Tongue normal. Mucous membranes moist.  RESPIRATORY:  Clear to auscultation without rales, wheezes or  rhonchi. CARDIOVASCULAR:  Regular rate and rhythm without murmur, rub or gallop. ABDOMEN:  Soft, non tender with active bowel sounds, and no appreciable hepatosplenomegaly.  No masses.  No guarding or rebound tenderness. SKIN:  No rashes,  ulcers or lesions. EXTREMITIES: No edema, no skin discoloration or tenderness.  No palpable cords. LYMPH NODES: No palpable cervical, supraclavicular, axillary or inguinal adenopathy  NEUROLOGICAL: Unremarkable. PSYCH:  Appropriate.    Appointment on 10/04/2016  Component Date Value Ref Range Status  . WBC 10/04/2016 4.7  3.8 - 10.6 K/uL Final  . RBC 10/04/2016 3.67* 4.40 - 5.90 MIL/uL Final  . Hemoglobin 10/04/2016 10.9* 13.0 - 18.0 g/dL Final  . HCT 10/04/2016 31.8* 40.0 - 52.0 % Final  . MCV 10/04/2016 86.6  80.0 - 100.0 fL Final  . MCH 10/04/2016 29.8  26.0 - 34.0 pg Final  . MCHC 10/04/2016 34.4  32.0 - 36.0 g/dL Final  . RDW 10/04/2016 16.0* 11.5 - 14.5 % Final  . Platelets 10/04/2016 73* 150 - 440 K/uL Final  . Neutrophils Relative % 10/04/2016 51  % Final  . Neutro Abs 10/04/2016 2.4  1.4 - 6.5 K/uL Final  . Lymphocytes Relative 10/04/2016 28  % Final  . Lymphs Abs 10/04/2016 1.3  1.0 - 3.6 K/uL Final  . Monocytes Relative 10/04/2016 12  % Final  . Monocytes Absolute 10/04/2016 0.6  0.2 - 1.0 K/uL Final  . Eosinophils Relative 10/04/2016 8  % Final  . Eosinophils Absolute 10/04/2016 0.4  0 - 0.7 K/uL Final  . Basophils Relative 10/04/2016 1  % Final  . Basophils Absolute 10/04/2016 0.0  0 - 0.1 K/uL Final  . Sodium 10/04/2016 133* 135 - 145 mmol/L Final  . Potassium 10/04/2016 4.5  3.5 - 5.1 mmol/L Final  . Chloride 10/04/2016 107  101 - 111 mmol/L Final  . CO2 10/04/2016 22  22 - 32 mmol/L Final  . Glucose, Bld 10/04/2016 343* 65 - 99 mg/dL Final  . BUN 10/04/2016 24* 6 - 20 mg/dL Final  . Creatinine, Ser 10/04/2016 1.32* 0.61 - 1.24 mg/dL Final  . Calcium 10/04/2016 9.3  8.9 - 10.3 mg/dL Final  . Total Protein 10/04/2016 7.6  6.5 -  8.1 g/dL Final  . Albumin 10/04/2016 2.9* 3.5 - 5.0 g/dL Final  . AST 10/04/2016 49* 15 - 41 U/L Final  . ALT 10/04/2016 49  17 - 63 U/L Final  . Alkaline Phosphatase 10/04/2016 91  38 - 126 U/L Final  . Total Bilirubin 10/04/2016 1.2  0.3 - 1.2 mg/dL Final  . GFR calc non Af Amer 10/04/2016 51* >60 mL/min Final  . GFR calc Af Amer 10/04/2016 59* >60 mL/min Final   Comment: (NOTE) The eGFR has been calculated using the CKD EPI equation. This calculation has not been validated in all clinical situations. eGFR's persistently <60 mL/min signify possible Chronic Kidney Disease.   . Anion gap 10/04/2016 4* 5 - 15 Final  . Ferritin 10/04/2016 53  24 - 336 ng/mL Final  Admission on 09/30/2016, Discharged on 10/03/2016  Component Date Value Ref Range Status  . Ammonia 09/30/2016 103* 9 - 35 umol/L Final  . WBC 09/30/2016 5.2  3.8 - 10.6 K/uL Final  . RBC 09/30/2016 4.28* 4.40 - 5.90 MIL/uL Final  . Hemoglobin 09/30/2016 12.5* 13.0 - 18.0 g/dL Final  . HCT 09/30/2016 36.6* 40.0 - 52.0 % Final  . MCV 09/30/2016 85.4  80.0 - 100.0 fL Final  . MCH 09/30/2016 29.3  26.0 - 34.0 pg Final  . MCHC 09/30/2016 34.3  32.0 - 36.0 g/dL Final  . RDW 09/30/2016 15.9* 11.5 - 14.5 % Final  . Platelets 09/30/2016 81* 150 - 440 K/uL Final  . Sodium 09/30/2016 138  135 - 145 mmol/L Final  . Potassium 09/30/2016 4.5  3.5 - 5.1 mmol/L Final   HEMOLYSIS AT THIS LEVEL MAY AFFECT RESULT  . Chloride 09/30/2016 108  101 - 111 mmol/L Final  . CO2 09/30/2016 21* 22 - 32 mmol/L Final  . Glucose, Bld 09/30/2016 250* 65 - 99 mg/dL Final  . BUN 09/30/2016 25* 6 - 20 mg/dL Final  . Creatinine, Ser 09/30/2016 1.21  0.61 - 1.24 mg/dL Final  . Calcium 09/30/2016 10.1  8.9 - 10.3 mg/dL Final  . Total Protein 09/30/2016 8.4* 6.5 - 8.1 g/dL Final  . Albumin 09/30/2016 3.5  3.5 - 5.0 g/dL Final  . AST 09/30/2016 35  15 - 41 U/L Final  . ALT 09/30/2016 32  17 - 63 U/L Final  . Alkaline Phosphatase 09/30/2016 74  38 - 126 U/L  Final  . Total Bilirubin 09/30/2016 1.7* 0.3 - 1.2 mg/dL Final  . GFR calc non Af Amer 09/30/2016 56* >60 mL/min Final  . GFR calc Af Amer 09/30/2016 >60  >60 mL/min Final   Comment: (NOTE) The eGFR has been calculated using the CKD EPI equation. This calculation has not been validated in all clinical situations. eGFR's persistently <60 mL/min signify possible Chronic Kidney Disease.   . Anion gap 09/30/2016 9  5 - 15 Final  . Troponin I 09/30/2016 <0.03  <0.03 ng/mL Final  . Color, Urine 09/30/2016 YELLOW* YELLOW Final  . APPearance 09/30/2016 CLEAR* CLEAR Final  . Specific Gravity, Urine 09/30/2016 1.015  1.005 - 1.030 Final  . pH 09/30/2016 6.0  5.0 - 8.0 Final  . Glucose, UA 09/30/2016 NEGATIVE  NEGATIVE mg/dL Final  . Hgb urine dipstick 09/30/2016 SMALL* NEGATIVE Final  . Bilirubin Urine 09/30/2016 NEGATIVE  NEGATIVE Final  . Ketones, ur 09/30/2016 5* NEGATIVE mg/dL Final  . Protein, ur 09/30/2016 100* NEGATIVE mg/dL Final  . Nitrite 09/30/2016 NEGATIVE  NEGATIVE Final  . Leukocytes, UA 09/30/2016 NEGATIVE  NEGATIVE Final  . RBC / HPF 09/30/2016 0-5  0 - 5 RBC/hpf Final  . WBC, UA 09/30/2016 0-5  0 - 5 WBC/hpf Final  . Bacteria, UA 09/30/2016 NONE SEEN  NONE SEEN Final  . Squamous Epithelial / LPF 09/30/2016 NONE SEEN  NONE SEEN Final  . Ammonia 10/01/2016 72* 9 - 35 umol/L Final  . Glucose-Capillary 09/30/2016 183* 65 - 99 mg/dL Final  . Sodium 10/01/2016 140  135 - 145 mmol/L Final  . Potassium 10/01/2016 4.1  3.5 - 5.1 mmol/L Final  . Chloride 10/01/2016 111  101 - 111 mmol/L Final  . CO2 10/01/2016 21* 22 - 32 mmol/L Final  . Glucose, Bld 10/01/2016 234* 65 - 99 mg/dL Final  . BUN 10/01/2016 21* 6 - 20 mg/dL Final  . Creatinine, Ser 10/01/2016 1.08  0.61 - 1.24 mg/dL Final  . Calcium 10/01/2016 9.6  8.9 - 10.3 mg/dL Final  . Total Protein 10/01/2016 7.8  6.5 - 8.1 g/dL Final  . Albumin 10/01/2016 3.1* 3.5 - 5.0 g/dL Final  . AST 10/01/2016 34  15 - 41 U/L Final  . ALT  10/01/2016 32  17 - 63 U/L Final  . Alkaline Phosphatase 10/01/2016 70  38 - 126 U/L Final  . Total Bilirubin 10/01/2016 2.0* 0.3 - 1.2 mg/dL Final  . GFR calc non Af Amer 10/01/2016 >60  >60 mL/min Final  . GFR calc Af Amer 10/01/2016 >60  >60 mL/min Final   Comment: (NOTE) The eGFR has been calculated using the CKD EPI equation.  This calculation has not been validated in all clinical situations. eGFR's persistently <60 mL/min signify possible Chronic Kidney Disease.   . Anion gap 10/01/2016 8  5 - 15 Final  . WBC 10/01/2016 5.2  3.8 - 10.6 K/uL Final  . RBC 10/01/2016 4.00* 4.40 - 5.90 MIL/uL Final  . Hemoglobin 10/01/2016 12.0* 13.0 - 18.0 g/dL Final  . HCT 10/01/2016 34.7* 40.0 - 52.0 % Final  . MCV 10/01/2016 86.8  80.0 - 100.0 fL Final  . MCH 10/01/2016 30.0  26.0 - 34.0 pg Final  . MCHC 10/01/2016 34.6  32.0 - 36.0 g/dL Final  . RDW 10/01/2016 16.1* 11.5 - 14.5 % Final  . Platelets 10/01/2016 77* 150 - 440 K/uL Final  . Prothrombin Time 10/01/2016 14.5  11.4 - 15.2 seconds Final  . INR 10/01/2016 1.12   Final  . Glucose-Capillary 10/01/2016 209* 65 - 99 mg/dL Final  . Glucose-Capillary 10/01/2016 294* 65 - 99 mg/dL Final  . Glucose-Capillary 10/01/2016 250* 65 - 99 mg/dL Final  . WBC 10/02/2016 6.8  3.8 - 10.6 K/uL Final  . RBC 10/02/2016 3.78* 4.40 - 5.90 MIL/uL Final  . Hemoglobin 10/02/2016 11.1* 13.0 - 18.0 g/dL Final  . HCT 10/02/2016 32.4* 40.0 - 52.0 % Final  . MCV 10/02/2016 85.8  80.0 - 100.0 fL Final  . MCH 10/02/2016 29.3  26.0 - 34.0 pg Final  . MCHC 10/02/2016 34.2  32.0 - 36.0 g/dL Final  . RDW 10/02/2016 16.3* 11.5 - 14.5 % Final  . Platelets 10/02/2016 77* 150 - 440 K/uL Final  . Sodium 10/02/2016 138  135 - 145 mmol/L Final  . Potassium 10/02/2016 3.9  3.5 - 5.1 mmol/L Final  . Chloride 10/02/2016 111  101 - 111 mmol/L Final  . CO2 10/02/2016 22  22 - 32 mmol/L Final  . Glucose, Bld 10/02/2016 258* 65 - 99 mg/dL Final  . BUN 10/02/2016 17  6 - 20 mg/dL  Final  . Creatinine, Ser 10/02/2016 1.15  0.61 - 1.24 mg/dL Final  . Calcium 10/02/2016 9.6  8.9 - 10.3 mg/dL Final  . Total Protein 10/02/2016 7.3  6.5 - 8.1 g/dL Final  . Albumin 10/02/2016 2.9* 3.5 - 5.0 g/dL Final  . AST 10/02/2016 27  15 - 41 U/L Final  . ALT 10/02/2016 27  17 - 63 U/L Final  . Alkaline Phosphatase 10/02/2016 63  38 - 126 U/L Final  . Total Bilirubin 10/02/2016 1.6* 0.3 - 1.2 mg/dL Final  . GFR calc non Af Amer 10/02/2016 60* >60 mL/min Final  . GFR calc Af Amer 10/02/2016 >60  >60 mL/min Final   Comment: (NOTE) The eGFR has been calculated using the CKD EPI equation. This calculation has not been validated in all clinical situations. eGFR's persistently <60 mL/min signify possible Chronic Kidney Disease.   . Anion gap 10/02/2016 5  5 - 15 Final  . Ammonia 10/02/2016 34  9 - 35 umol/L Final  . Glucose-Capillary 10/01/2016 280* 65 - 99 mg/dL Final  . Glucose-Capillary 10/02/2016 233* 65 - 99 mg/dL Final  . Specimen Description 10/02/2016 BLOOD RIGHT ARM   Final  . Special Requests 10/02/2016 BOTTLES DRAWN AEROBIC AND ANAEROBIC Blood Culture adequate volume   Final  . Culture 10/02/2016 NO GROWTH 3 DAYS   Final  . Report Status 10/02/2016 PENDING   Incomplete  . Specimen Description 10/02/2016 BLOOD RIGHT HAND   Final  . Special Requests 10/02/2016 BOTTLES DRAWN AEROBIC AND ANAEROBIC Blood Culture adequate volume  Final  . Culture 10/02/2016 NO GROWTH 3 DAYS   Final  . Report Status 10/02/2016 PENDING   Incomplete  . Specimen Description 09/30/2016 URINE, CLEAN CATCH   Final  . Special Requests 09/30/2016 Normal   Final  . Culture 09/30/2016    Final                   Value:NO GROWTH Performed at [facility], [place] [address] [place], [place] [address]   . Report Status 09/30/2016 10/04/2016 FINAL   Final  . Glucose-Capillary 10/02/2016 294* 65 - 99 mg/dL Final  . Glucose-Capillary 10/02/2016 209* 65 - 99 mg/dL Final  . Glucose-Capillary 10/02/2016  219* 65 - 99 mg/dL Final  . [facility] test code 10/03/2016 HEMOGLOBIN A1C   Final  . [facility] test name 10/03/2016 HEMOGLOBIN A1C   Final  . Misc [facility] result 10/03/2016 COMMENT   Final   Comment: (NOTE) Test Ordered: [id number] Hemoglobin A1c Hemoglobin A1c                 8.9         [H ] %        BN     Reference Range: 4.8-5.6                                       Prediabetes: 5.7 - 6.4         Diabetes: >6.4         Glycemic control for adults with diabetes: <7.0 Performed At: [facility] [place], [place] [id number] Lindon Romp MD AQ:[id number]   . Glucose-Capillary 10/03/2016 258* 65 - 99 mg/dL Final  . Glucose-Capillary 10/03/2016 328* 65 - 99 mg/dL Final    Assessment:  Zachary Buck is a 76 y.o. male with iron deficiency anemia and stage II multiple myeloma. 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 Serum immunoglobulins noted an IgG of 2930 (high). 24-hour urine revealed a 8.8% monoclonal protein (10.6 mg/24 hours).  Albumen was 3.3 on 08/26/2014. He had pneumonia in 05/2014.   Bone survey on 10/20/2014 revealed no lytic lesions. Bone survey on 09/16/2015 revealed no focal lytic lesion or acute bony abnormality.   SPEP has been followed:  2.3 gm/dL on 10/09/2014, 2.3 gm/dL on 01/18/2015, 2.3 gm/dL on 03/22/2015, 2.3 gm/dL on 06/18/2015, 2.3 gm/dL on 09/13/2015, 2.3 gm/dL on 11/08/2015, 2.7 gm/dL on 01/11/2016, 2.8 gm/dL on 02/17/2016, 2.4 gm/dL on 03/28/2016, 2.8 gm/dL on 04/24/2016, 2.3 gm/dL on 06/01/2016, 2.5 on 07/03/2016, 2.7 on 08/03/2016, 2.8 on 08/30/2016.   Lambda free light chainswere155.3 (ratio 0.15) on 01/18/2015, 174.59 (ratio of 0.16)on 06/18/2015, 182.9 (ratio of 0.13)on 09/13/2015, 205 (ratio 0.11) on 11/08/2015, 292.7 (ratio 0.09) on 01/11/2016, and 167.0 (ratio 0.13) on 03/27/2016.  Bone marrow on 11/10/2014 revealed a 10%  monoclonal plasma cell infiltrate. Marrow was hypercellular for age (40-50%) with mixed maturing hematopoiesis, relative erythroid hyperplasia and mild nonspecific dyserythropoiesis. There was patchy mild focally moderate increase in reticulin. There were no significant iron stores. Myeloma FISH panel revealed t(11;14) which results in fusion of CCND1 (BCL1) at 11q13 with the immunoglobulin heavy chain gene (IgH) at 14q32 (a favorable prognostic feature).   Bone marrow on 03/14/2016 revealed persistent monoclonal plasma cell infiltrate of 10-15%.  Marrow was hypercellular for age (40-50%) with trilineage hematopoiesis,  increase eosinophils, adequate megakaryocytes and no increase in blasts. There was mild patchy increase in reticulin fibers. There was stainable iron consistent with recent parenteral iron.  Cytogenetics were normal (46, XY). FISH studies revealed CCND1/IGH translocation t(11;14).  Beta2 microglobulin was 4.2 on 11/24/2014, 3.8 on 06/18/2015, 4.5 on 09/13/2015, 4.5 on 10/18/2015, and 4.6 on 04/24/2016.  PET scan on 12/07/2014 revealed no hypermetabolic foci within the marrow space or nodal stations to suggest active multiple myeloma. There was hypermetabolism within the right lower lobe, corresponding to a similar dependent density. Morphology favored scarring.There was cirrhosis and portal hypertension. There was gastric body hypermetabolism favoring physiologic. Spleen was 15.3 cm (volume 1065 cc).  There was pulmonary artery enlargement suggesting pulmonary artery hypertension. 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  He has a history of asbestos exposure in the [employer].  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  Etiology is felt secondary to fatty liver.  Abdominal CT scan on 03/16/2015 revealed splenomegaly (14.5 cm), cirrhosis, and stigmata of portal hypertension with esophageal and gastric varices.  There were no liver lesions.  He is on  aldactone and lactulose.  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  There were no pleural effusions.  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  There was no frank honeycombing. This finding accounted for the medial right lower lobe hypermetabolism on the 12/07/2014 PET-CT.  There was cirrhosis, splenomegaly, and prominent gastroesophageal varices.  He has had mild anemia since 10/2013 and mild thrombocytopenia since 05/2014.   Colonoscopy in 10/2013 revealed polyps.  Colonoscopy on 03/04/2015 revealed a sessile polyp was seen in the transverse colon (tubular adenoma).  Colonoscopy on 12/09/2015 revealed one 4 mm polyp in the ascending colon and two 4-5 mm polyps in the sigmoid colon (hyperplastic polyps).    EGD in 10/2013 revealed gastritis. EGD on 03/04/2015 revealed medium-sized esophageal varices in the mid and distal esophagus without bleeding. There was a sessile polyp in the gastric antrum (hyperplastic polyp).  There were 3 small angiodysplastic lesions in the second part of the duodenum.  EGD on 12/09/2015 revealed grade II esophageal varices.  There was a single gastric polyp.  There was a single bleeding angioectasia in the duodenum treated with bipolar cautery and clips.  EGD on 02/24/2016 revealed no gross lesions in the esophagus. There was a single gastric polyp and a duodenal polyp. Resection was attempted. No specimens were collected  Capsule enteroscopy on 11/27/2014 revealed 1-2 gastric polyps and one colon polyp.  There were no findings to explain iron deficiency anemia.  His diet was good. Labs confirmed iron deficiency (ferritin 11.8 on 05/2014 and 13 on 10/09/2014). B12 was low normal (MMA normal) thus ruling out B12  deficiency.  He has chronic diarrhea (2 years).  Diarrhea improved on Enterogam (began 06/22/2015).  He has intermittent diarrhea.    He has recurrent iron deficiency anemia.  He has received Venofer weekly 3 (03/26/2015 - 04/14/2015), weekly x 3 (06/25/2015 - 07/09/2015), weekly x 3 (09/17/2015 - 10/01/2015), weekly x 3 (11/16/2015 - 11/30/2015), weekly x 2 (01/18/2016 - 01/25/2016),  weekly x 3 (02/18/2016 - 03/03/2016), and 200 mg (05/04/2016).    Ferritin has been followed:  23 on 06/18/2015, 121 on 07/26/2015, 17 on 09/13/2015, 18 on 11/15/2015, 52 on 12/13/2015, 10 on 01/11/2016, 88 on 02/03/2016, 23 on 02/17/2016, 46  on 03/27/2016, 20 on 04/24/2016, 31 on 06/01/2016, 16 on 07/03/2016, 119 on 08/03/2016, 55 on 08/30/2016, 53 on 10/04/2016.  Symptomatically, patient reports that he is feeling "good". Since being discharged from the hospital, patient has returned to baseline. He completes all of his activities of daily living. 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 He continues 30cc lactulose QID secondary to hepatic encephalopathy.  Hemoglobin is 10.9 and platelets 73,000.  Ferritin is 53. SPEP pending.   Plan: 1.  Review labs from 10/04/2016.  Hemoglobin and platelet count relatively stable. M-spike fluctuates slightly.  Discuss continued observation.  Review indications for treatment. 2.  No Venofer today; ferritin 53. 3.  Ultrasound abdomen to assess liver and spleen. 4.  Closely monitor blood sugars at home. Wife not willing to give patient the prescribed insulin, citing that he is better controlled on Metformin and Glipizide. Discussed the fact that patient's blood glucose level was elevated yesterday when labs were done. Wife adamant that insulin is not needed at this time.    5.  Discuss with Dr. Allen Norris beta blocker therapy to determine this medication is needed to treat his portal hypertension.  6.  Discuss future treatment of multiple myeloma when indicated. Wife  does not want patient to be started on medications. She reports that she is treating him at home with multiple herbal remedies. Wife asked to provide a list of these products so that they can be reviewed with pharmacy to ensure that they are not contributing to his thrombocytopenia.  7.  RTC in 2 days after abdominal ultrasound for MD assessment and review of results.  8.  RTC in 1 month for labs (CBC with diff, CMP, SPEP, AFP, ferritin). 9.  RTC in 2 months for MD assessment, labs (CBC with diff, CMP, SPEP, ferritin-day before) +/- Venofer.  Honor Loh, NP  10/05/2016, 2:12 PM   I saw and evaluated the patient, participating in the key portions of the service and reviewing pertinent diagnostic studies and records.  I reviewed the nurse practitioner's note and agree with the findings and the plan.  The assessment and plan were discussed with the patient.  Additional diagnostic studies of an abdominal ultrasound are needed to assess his liver and spleen and would change the clinical management.  Multiple questions were asked by the patient and answered.   Lequita Asal, MD 10/05/2016,3:39 PM

## 2016-10-06 ENCOUNTER — Telehealth: Payer: Self-pay | Admitting: *Deleted

## 2016-10-06 ENCOUNTER — Telehealth: Payer: Self-pay | Admitting: Internal Medicine

## 2016-10-06 NOTE — Telephone Encounter (Signed)
The concern was that given his variation in his clinical status that the SSI would be better.  This would allow just to give what is needed.  Would recommend stopping the metformin and glipizide and try just using SSI.  We will follow amount using and then determine if needs to be on scheduled insulin.  Thanks

## 2016-10-06 NOTE — Telephone Encounter (Signed)
Pt spouse called and wanted to know if they can continue pt's metformin and glipizide as the pt blood sugar has been dropping since he was released from the hospital. Spouse states that pt's blood sugar was 345 Wednesday morning, 222 Thursday morning, and it was 192 this morning. Please advise, thank you!  Call @ [phone]

## 2016-10-06 NOTE — Telephone Encounter (Signed)
Patient DPR wife notified., and she stated she would start patient on the insulin tomorrow.

## 2016-10-06 NOTE — Telephone Encounter (Signed)
Pt has not started the sliding scale insulin yet.

## 2016-10-06 NOTE — Telephone Encounter (Signed)
Pt's wife had a couple of questions regarding the metoprolol and Lactulose. Advised her Pt has an appt with Dr. Allen Norris next week and these can be discussed at that time.

## 2016-10-06 NOTE — Telephone Encounter (Signed)
Patient has not taken any insulin patient wife has been giving him the Metformin and the glipizide CBG's 222 on Thursday and this morning CBG was 191 fasting, so should patient just continue glipizide and metformin or start the sliding scale and stop the Metformin and the Glipizide.

## 2016-10-06 NOTE — Telephone Encounter (Signed)
Notified wife that his mspike results, voiced understanding.

## 2016-10-07 LAB — CULTURE, BLOOD (ROUTINE X 2)
CULTURE: NO GROWTH
Culture: NO GROWTH
SPECIAL REQUESTS: ADEQUATE
SPECIAL REQUESTS: ADEQUATE

## 2016-10-10 ENCOUNTER — Encounter: Payer: Self-pay | Admitting: Hematology and Oncology

## 2016-10-10 ENCOUNTER — Other Ambulatory Visit: Payer: Self-pay | Admitting: *Deleted

## 2016-10-10 ENCOUNTER — Other Ambulatory Visit
Admission: RE | Admit: 2016-10-10 | Discharge: 2016-10-10 | Disposition: A | Discharge status: Home / Self Care | Payer: PPO | Source: Ambulatory Visit | Attending: Gastroenterology | Admitting: Gastroenterology

## 2016-10-10 ENCOUNTER — Ambulatory Visit
Admission: RE | Admit: 2016-10-10 | Discharge: 2016-10-10 | Disposition: A | Discharge status: Home / Self Care | Payer: PPO | Source: Ambulatory Visit | Attending: Urgent Care | Admitting: Urgent Care

## 2016-10-10 DIAGNOSIS — K7469 Other cirrhosis of liver: Secondary | ICD-10-CM | POA: Diagnosis not present

## 2016-10-10 DIAGNOSIS — C9000 Multiple myeloma not having achieved remission: Secondary | ICD-10-CM | POA: Insufficient documentation

## 2016-10-10 DIAGNOSIS — D500 Iron deficiency anemia secondary to blood loss (chronic): Secondary | ICD-10-CM | POA: Insufficient documentation

## 2016-10-10 DIAGNOSIS — K7290 Hepatic failure, unspecified without coma: Secondary | ICD-10-CM | POA: Diagnosis not present

## 2016-10-10 DIAGNOSIS — K7460 Unspecified cirrhosis of liver: Secondary | ICD-10-CM | POA: Diagnosis not present

## 2016-10-10 DIAGNOSIS — R161 Splenomegaly, not elsewhere classified: Secondary | ICD-10-CM | POA: Insufficient documentation

## 2016-10-10 DIAGNOSIS — R188 Other ascites: Secondary | ICD-10-CM

## 2016-10-10 DIAGNOSIS — K7682 Hepatic encephalopathy: Secondary | ICD-10-CM

## 2016-10-10 LAB — COMPREHENSIVE METABOLIC PANEL
ALBUMIN: 3.2 g/dL — AB (ref 3.5–5.0)
ALK PHOS: 83 U/L (ref 38–126)
ALT: 36 U/L (ref 17–63)
ANION GAP: 5 (ref 5–15)
AST: 34 U/L (ref 15–41)
BUN: 19 mg/dL (ref 6–20)
CHLORIDE: 107 mmol/L (ref 101–111)
CO2: 25 mmol/L (ref 22–32)
Calcium: 10 mg/dL (ref 8.9–10.3)
Creatinine, Ser: 1.13 mg/dL (ref 0.61–1.24)
GFR calc Af Amer: >60 mL/min (ref >60)
GFR calc non Af Amer: >60 mL/min (ref >60)
GLUCOSE: 213 mg/dL — AB (ref 65–99)
POTASSIUM: 4.4 mmol/L (ref 3.5–5.1)
SODIUM: 137 mmol/L (ref 135–145)
Total Bilirubin: 1 mg/dL (ref 0.3–1.2)
Total Protein: 8.2 g/dL — ABNORMAL HIGH (ref 6.5–8.1)

## 2016-10-10 NOTE — Progress Notes (Signed)
[facility] day:  10/11/16  Chief Complaint: Zachary Buck is a 76 y.o. male  with stage II multiple myeloma and iron deficiency anemia who is seen for review of interval ultrasound.  HPI: The patient was last seen in the medical oncology clinic on 10/05/2016.  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 Lactulose was increased. Patient was discharged on both Xifaxan and Lactulose, with plans to follow up with Dr. Allen Norris.  M-spike was 2.5 gm/dL.  Abdominal ultrasound was scheduled to assess his liver and spleen given his persistent thrombocytopenia.  Abdominal ultrasound on 10/10/2016 demonstrated nodular hepatic contour, suggesting cirrhosis.  There were no focal hepatic lesions. Portal vein was patent on color doppler imaging with normal direction of blood flow towards the liver.  He has splenomegaly (16.8 x 15.3 x 8.3 cm).  Symptomatically, patient is feeling well today. Patient, and his wife, deny any altered mental status since his last visit. She is concerned about the discrepancy of labs between his hospitalization and the [facility] [place].   Past Medical History:  Diagnosis Date  . Anemia   . Atherosclerotic heart disease    s/p MI, s/p stent mid circumflex  . CAD (coronary artery disease)    Dr Nehemiah Massed, cardiologist  . Cirrhosis of liver ([place])   . Clavicle fracture   . Diabetes mellitus without complication ([place])    oral med  . Elevated blood sugar    02/03/16 and 02/09/16 had BS over 400.  otherwise running around 250  . Esophageal varices ([place])   . Hepatic encephalopathy ([place])   . Hypercholesterolemia   . Hypertension    controlled on meds  . Internal hemorrhoids   . Leg fracture   . Multiple myeloma ([place])   . Myocardial infarct ([place])   . Prostate enlargement   . Tubular adenoma of colon     Past Surgical  History:  Procedure Laterality Date  . COLONOSCOPY WITH PROPOFOL N/A 03/04/2015   Procedure: COLONOSCOPY WITH PROPOFOL;  Surgeon: Jerene Bears, MD;  Location: [facility];  Service: Gastroenterology;  Laterality: N/A;  . COLONOSCOPY WITH PROPOFOL N/A 12/09/2015   Procedure: COLONOSCOPY WITH PROPOFOL;  Surgeon: [name], MD;  Location: [facility];  Service: Endoscopy;  Laterality: N/A;  DIABETIC-ORAL MEDS  . CORONARY ANGIOPLASTY WITH STENT PLACEMENT     2 stents  . ESOPHAGOGASTRODUODENOSCOPY (EGD) WITH PROPOFOL N/A 03/04/2015   Procedure: ESOPHAGOGASTRODUODENOSCOPY (EGD) WITH PROPOFOL;  Surgeon: Jerene Bears, MD;  Location: [facility];  Service: Gastroenterology;  Laterality: N/A;  . ESOPHAGOGASTRODUODENOSCOPY (EGD) WITH PROPOFOL N/A 12/09/2015   Procedure: ESOPHAGOGASTRODUODENOSCOPY (EGD) WITH PROPOFOL;  Surgeon: [name], MD;  Location: [facility];  Service: Endoscopy;  Laterality: N/A;  . ESOPHAGOGASTRODUODENOSCOPY (EGD) WITH PROPOFOL N/A 12/21/2015   Procedure: ESOPHAGOGASTRODUODENOSCOPY (EGD) WITH PROPOFOL;  Surgeon: [name], MD;  Location: [facility];  Service: Endoscopy;  Laterality: N/A;  . ESOPHAGOGASTRODUODENOSCOPY (EGD) WITH PROPOFOL N/A 02/24/2016   Procedure: ESOPHAGOGASTRODUODENOSCOPY (EGD) WITH PROPOFOL;  Surgeon: [name], MD;  Location: [facility];  Service: Endoscopy;  Laterality: N/A;  diabetic - oral med  . POLYPECTOMY N/A 12/09/2015   Procedure: POLYPECTOMY;  Surgeon: [name], MD;  Location: [facility];  Service: Endoscopy;  Laterality: N/A;  . TONSILLECTOMY      Family History  Problem Relation Age of Onset  . Aneurysm Father 15  of the heart  . Heart attack Father   . Diabetes Mother     Social History:  reports that he has never smoked. He has never used smokeless tobacco. He reports that he does not drink alcohol or use drugs.  Patient denies any exposure to radiation or toxins.  He is a Company secretary.  He  lives in [place].  The patient is accompanied by his wife, [name], today.  Allergies:  Allergies  Allergen Reactions  . Niaspan [Niacin Er] Other (See Comments)    Just does not want to take     Current Medications: Current Outpatient Prescriptions  Medication Sig Dispense Refill  . Alpha-Lipoic Acid (LIPOIC ACID PO) Take 200 mg by mouth daily.     . Ascorbic Acid (VITAMIN C) 1000 MG tablet Take 1,000 mg by mouth daily.    . B Complex Vitamins (VITAMIN B COMPLEX) TABS Take by mouth daily.    Jolyne Loa Grape-Goldenseal (BERBERINE COMPLEX PO) Take 2,000 mg by mouth 2 (two) times daily.    . blood glucose meter kit and supplies KIT Dispense based on patient and insurance preference. Use up to four times daily as directed. E11.9 Please dispense Freestyle freedom meter and supplies. 1 each 0  . Cholecalciferol (VITAMIN D-3) 1000 UNITS CAPS Take by mouth.    . Cinnamon 500 MG capsule Take 500 mg by mouth.    . Coenzyme Q10 100 MG capsule Take 100 mg by mouth daily.     . furosemide (LASIX) 20 MG tablet Take 1 tablet (20 mg total) by mouth 2 (two) times daily. 60 tablet 3  . glipiZIDE (GLUCOTROL) 5 MG tablet Take 1 tablet (5 mg total) by mouth See admin instructions. 270 tablet 1  . glucose blood (ONE TOUCH ULTRA TEST) test strip Use as instructed 100 each 12  . glucose blood test strip Freestyle lite test strips, check blood sugar three times daily: Dx 250.00 100 each 3  . insulin aspart (NOVOLOG) 100 UNIT/ML injection Inject 0-9 Units into the skin 3 (three) times daily with meals. 100 mL 1  . lactulose (CHRONULAC) 10 GM/15ML solution Take 30 mLs (20 g total) by mouth 4 (four) times daily. 946 mL 0  . Lancets (ONETOUCH ULTRASOFT) lancets Use as instructed 100 each 12  . lisinopril (PRINIVIL,ZESTRIL) 20 MG tablet TAKE ONE TABLET BY MOUTH EVERY DAY/ AM    . metFORMIN (GLUCOPHAGE) 1000 MG tablet TAKE 1 TABLET BY MOUTH TWICE A DAY WITH A MEAL 180 tablet 1  . MILK THISTLE PO Take 25 mg  by mouth daily.    . Omega-3 Fatty Acids (FISH OIL) 1000 MG CAPS Take 4,000 mg by mouth daily.     . rifaximin (XIFAXAN) 550 MG TABS tablet Take 1 tablet (550 mg total) by mouth 2 (two) times daily. 60 tablet 11  . sildenafil (REVATIO) 20 MG tablet 3 to 5 tablets per day or as instructed    . spironolactone (ALDACTONE) 50 MG tablet Take 1 tablet (50 mg total) by mouth daily. 30 tablet 3  . Theanine 100 MG CAPS Take by mouth daily.    Marland Kitchen triamcinolone cream (KENALOG) 0.1 % Apply topically 2 (two) times daily. Please avoid face and genitalia area.  Do not use in the same spot for more than 7-10 days in a row. Pharmacy changed to [facility]. 80 g 0  . Turmeric POWD 4,000 mg by Does not apply route daily.    . vitamin B-12 (CYANOCOBALAMIN) 100 MCG tablet Take 200  mcg by mouth daily.    Marland Kitchen zinc sulfate 220 (50 Zn) MG capsule Take 1 capsule (220 mg total) by mouth 2 (two) times daily.     No current facility-administered medications for this visit.    Facility-Administered Medications Ordered in Other Visits  Medication Dose Route Frequency Provider Last Rate Last Dose  . alteplase (CATHFLO ACTIVASE) injection 2 mg  2 mg Intracatheter Once PRN Nolon Stalls C, MD      . heparin lock flush 100 unit/mL  500 Units Intracatheter Once PRN Mike Gip, Melissa C, MD      . heparin lock flush 100 unit/mL  250 Units Intracatheter Once PRN Mike Gip, Melissa C, MD      . sodium chloride 0.9 % injection 10 mL  10 mL Intracatheter PRN Corcoran, Melissa C, MD      . sodium chloride 0.9 % injection 3 mL  3 mL Intravenous Once PRN Lequita Asal, MD        Review of Systems:  GENERAL:  Feels "ok".  No fevers or sweats.  Weight stable.  PERFORMANCE STATUS (ECOG):  1 HEENT:  No visual changes, runny nose, sore throat, mouth sores or tenderness. Lungs: No shortness of breath or cough.  No hemoptysis. Cardiac:  No chest pain, palpitations, orthopnea, or PND. GI:  No nausea, vomiting, constipation, or  hematochezia.  GU:  No urgency, frequency, dysuria, or hematuria. Musculoskeletal:  No back pain.  No joint pain.  No muscle tenderness. Extremities:  No pain or swelling. Skin:  No rashes or skin changes. Neuro:  Short term memory issues.  Trouble concentrating.  Interval encephalopathy resolved with lactulose.  No headache, numbness or weakness, balance or coordination issues. Endocrine:  Diabetes (blood sugar in the 300s).  No thyroid issues, hot flashes or night sweats. Psych:  No mood changes, depression or anxiety. Pain:  No pain. Review of systems:  All other systems reviewed and found to be negative.  Physical Exam: BP (!) 147/77 (BP Location: Left Arm, Patient Position: Sitting)   Pulse (!) 55   Temp (!) 97.1 F (36.2 C) (Tympanic)   Resp 18   Wt 168 lb 6.9 oz (76.4 kg)   BMI 24.87 kg/m   GENERAL:  Well developed, well nourished, gentleman sitting comfortably in the exam room in no acute distress. MENTAL STATUS:  Alert and oriented to person, place and time. HEAD:  Pearline Cables hair.  Normocephalic, atraumatic, face symmetric, no Cushingoid features. EYES: Blue eyes.  No conjunctivitis or scleral icterus. NEUROLOGICAL: Unremarkable. PSYCH:  Appropriate.    Hospital Outpatient Visit on 10/10/2016  Component Date Value Ref Range Status  . Sodium 10/10/2016 137  135 - 145 mmol/L Final  . Potassium 10/10/2016 4.4  3.5 - 5.1 mmol/L Final  . Chloride 10/10/2016 107  101 - 111 mmol/L Final  . CO2 10/10/2016 25  22 - 32 mmol/L Final  . Glucose, Bld 10/10/2016 213* 65 - 99 mg/dL Final  . BUN 10/10/2016 19  6 - 20 mg/dL Final  . Creatinine, Ser 10/10/2016 1.13  0.61 - 1.24 mg/dL Final  . Calcium 10/10/2016 10.0  8.9 - 10.3 mg/dL Final  . Total Protein 10/10/2016 8.2* 6.5 - 8.1 g/dL Final  . Albumin 10/10/2016 3.2* 3.5 - 5.0 g/dL Final  . AST 10/10/2016 34  15 - 41 U/L Final  . ALT 10/10/2016 36  17 - 63 U/L Final  . Alkaline Phosphatase 10/10/2016 83  38 - 126 U/L Final  . Total  Bilirubin 10/10/2016  1.0  0.3 - 1.2 mg/dL Final  . GFR calc non Af Amer 10/10/2016 >60  >60 mL/min Final  . GFR calc Af Amer 10/10/2016 >60  >60 mL/min Final   Comment: (NOTE) The eGFR has been calculated using the CKD EPI equation. This calculation has not been validated in all clinical situations. eGFR's persistently <60 mL/min signify possible Chronic Kidney Disease.   Georgiann Hahn gap 10/10/2016 5  5 - 15 Final    Assessment:  BRYCETON HANTZ is a 76 y.o. male with iron deficiency anemia and stage II multiple myeloma. 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 Serum immunoglobulins noted an IgG of 2930 (high). 24-hour urine revealed a 8.8% monoclonal protein (10.6 mg/24 hours).  Albumen was 3.3 on 08/26/2014. He had pneumonia in 05/2014.   Bone survey on 10/20/2014 revealed no lytic lesions. Bone survey on 09/16/2015 revealed no focal lytic lesion or acute bony abnormality.   SPEP has been followed:  2.3 gm/dL on 10/09/2014, 2.3 gm/dL on 01/18/2015, 2.3 gm/dL on 03/22/2015, 2.3 gm/dL on 06/18/2015, 2.3 gm/dL on 09/13/2015, 2.3 gm/dL on 11/08/2015, 2.7 gm/dL on 01/11/2016, 2.8 gm/dL on 02/17/2016, 2.4 gm/dL on 03/28/2016, 2.8 gm/dL on 04/24/2016, 2.3 gm/dL on 06/01/2016, 2.5 on 07/03/2016, 2.7 on 08/03/2016, 2.8 on 08/30/2016, 2.5 on 10/04/2016.   Lambda free light chainswere155.3 (ratio 0.15) on 01/18/2015, 174.59 (ratio of 0.16)on 06/18/2015, 182.9 (ratio of 0.13)on 09/13/2015, 205 (ratio 0.11) on 11/08/2015, 292.7 (ratio 0.09) on 01/11/2016, and 167.0 (ratio 0.13) on 03/27/2016.  Bone marrow on 11/10/2014 revealed a 10% monoclonal plasma cell infiltrate. Marrow was hypercellular for age (40-50%) with mixed maturing hematopoiesis, relative erythroid hyperplasia and mild nonspecific dyserythropoiesis. There was patchy mild focally moderate increase in reticulin. There were no significant  iron stores. Myeloma FISH panel revealed t(11;14) which results in fusion of CCND1 (BCL1) at 11q13 with the immunoglobulin heavy chain gene (IgH) at 14q32 (a favorable prognostic feature).   Bone marrow on 03/14/2016 revealed persistent monoclonal plasma cell infiltrate of 10-15%.  Marrow was hypercellular for age (40-50%) with trilineage hematopoiesis, increase eosinophils, adequate megakaryocytes and no increase in blasts. There was mild patchy increase in reticulin fibers. There was stainable iron consistent with recent parenteral iron.  Cytogenetics were normal (46, XY). FISH studies revealed CCND1/IGH translocation t(11;14).  Beta2 microglobulin was 4.2 on 11/24/2014, 3.8 on 06/18/2015, 4.5 on 09/13/2015, 4.5 on 10/18/2015, and 4.6 on 04/24/2016.  PET scan on 12/07/2014 revealed no hypermetabolic foci within the marrow space or nodal stations to suggest active multiple myeloma. There was hypermetabolism within the right lower lobe, corresponding to a similar dependent density. Morphology favored scarring.There was cirrhosis and portal hypertension. There was gastric body hypermetabolism favoring physiologic. Spleen was 15.3 cm (volume 1065 cc).  There was pulmonary artery enlargement suggesting pulmonary artery hypertension. 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  He has a history of asbestos exposure in the [employer].  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  Etiology is felt secondary to fatty liver.  Abdominal CT scan on 03/16/2015 revealed splenomegaly (14.5 cm), cirrhosis, and stigmata of portal hypertension with esophageal and gastric varices.  There were no liver lesions.  He is on aldactone, lactulose, and Xifaxan.  Abdominal ultrasound on 10/10/2016 demonstrated nodular hepatic contour, suggesting cirrhosis.  There were no focal hepatic lesion is seen. Portal vein was patent on color Doppler imaging with normal direction of blood flow towards the  liver.  He has splenomegaly (16.8 x 15.3 x 8.3 cm).  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  There were no pleural effusions.  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  There was no frank honeycombing. This finding accounted for the medial right lower lobe hypermetabolism on the 12/07/2014 PET-CT.  There was cirrhosis, splenomegaly, and prominent gastroesophageal varices.  He has had mild anemia since 10/2013 and mild thrombocytopenia since 05/2014.   Colonoscopy in 10/2013 revealed polyps.  Colonoscopy on 03/04/2015 revealed a sessile polyp was seen in the transverse colon (tubular adenoma).  Colonoscopy on 12/09/2015 revealed one 4 mm polyp in the ascending colon and two 4-5 mm polyps in the sigmoid colon (hyperplastic polyps).    EGD in 10/2013 revealed gastritis. EGD on 03/04/2015 revealed medium-sized esophageal varices in the mid and distal esophagus without bleeding. There was a sessile polyp in the gastric antrum (hyperplastic polyp).  There were 3 small angiodysplastic lesions in the second part of the duodenum.  EGD on 12/09/2015 revealed grade II esophageal varices.  There was a single gastric polyp.  There was a single bleeding angioectasia in the duodenum treated with bipolar cautery and clips.  EGD on 02/24/2016 revealed no gross lesions in the esophagus. There was a single gastric polyp and a duodenal polyp. Resection was attempted. No specimens were collected  Capsule enteroscopy on 11/27/2014 revealed 1-2 gastric polyps and one colon polyp.  There were no findings to explain iron deficiency anemia.  His diet was good. Labs confirmed iron deficiency (ferritin 11.8 on 05/2014 and 13 on 10/09/2014). B12 was low normal (MMA normal) thus ruling  out B12 deficiency.  He has chronic diarrhea (2 years).  Diarrhea improved on Enterogam (began 06/22/2015).  He has intermittent diarrhea.    He has recurrent iron deficiency anemia.  He has received Venofer weekly 3 (03/26/2015 - 04/14/2015), weekly x 3 (06/25/2015 - 07/09/2015), weekly x 3 (09/17/2015 - 10/01/2015), weekly x 3 (11/16/2015 - 11/30/2015), weekly x 2 (01/18/2016 - 01/25/2016),  weekly x 3 (02/18/2016 - 03/03/2016), and 200 mg (05/04/2016).    Ferritin has been followed:  23 on 06/18/2015, 121 on 07/26/2015, 17 on 09/13/2015, 18 on 11/15/2015, 52 on 12/13/2015, 10 on 01/11/2016, 88 on 02/03/2016, 23 on 02/17/2016, 46 on 03/27/2016, 20 on 04/24/2016, 31 on 06/01/2016, 16 on 07/03/2016, 119 on 08/03/2016, 55 on 08/30/2016, 53 on 10/04/2016.  Symptomatically,  patient is feeling well today. Patient, and his wife, deny any altered mental status since his last visit.  Patient is oriented to baseline.  Patient has no physical complaints of anything today.   Plan: 1.  Review abdominal ultrasound from 10/10/2016.  No focal hepatic lesion was seen. He has mildly increased splenomegaly (16.8 x 15.3 x 8.3 cm). Discussed splenomegaly contributing to his thrombocytopenia.  2.  Pharmacy reviewed medication list to look for herbal products that may be contributing to patient's thrombocytopenia. Cinnamon and Co-q 10 both found to increase LFTs. There were no other interactions identified by Myra in pharmacy.  Patient's wife will be notified. 3.  Discuss the need to consider treatment of the patient's multiple myeloma. Patient's wife hesitant about any future treatment and the effects on his liver.  Reviewed signs and symptoms of myeloma. 4.  Discuss blood sugar control. Patient has had glucose readings of > 300. She felt that the insulin increased  his blood sugar.  Wife encouraged to discuss diabetes concerns with patient's PCP.  5.  Discuss CMP in the hospital and clinic.  Small variability in  labs which can occur day to day.  No concerns identified except his blood sugar. 6.  RTC in 1 month for labs (CBC with diff, CMP, SPEP, AFP, ferritin)  7.  RTC in 2 months for MD assessment, labs (CBC with diff, CMP, SPEP, ferritin-day before) +/- Venofer.   Honor Loh, NP  10/11/2016, 10:37 AM   I saw and evaluated the patient, participating in the key portions of the service and reviewing pertinent diagnostic studies and records.  I reviewed the nurse practitioner's note and agree with the findings and the plan.  The assessment and plan were discussed with the patient.  Several questions were asked by the patient's wife and answered.   Lequita Asal, MD 10/11/2016,12:07 PM

## 2016-10-11 ENCOUNTER — Telehealth: Payer: Self-pay | Admitting: *Deleted

## 2016-10-11 ENCOUNTER — Telehealth: Payer: Self-pay | Admitting: Internal Medicine

## 2016-10-11 ENCOUNTER — Encounter: Payer: Self-pay | Admitting: Hematology and Oncology

## 2016-10-11 ENCOUNTER — Inpatient Hospital Stay (HOSPITAL_BASED_OUTPATIENT_CLINIC_OR_DEPARTMENT_OTHER): Payer: PPO | Admitting: Hematology and Oncology

## 2016-10-11 VITALS — BP 147/77 | HR 55 | Temp 97.1°F | Resp 18 | Wt 168.4 lb

## 2016-10-11 DIAGNOSIS — D696 Thrombocytopenia, unspecified: Secondary | ICD-10-CM

## 2016-10-11 DIAGNOSIS — C9000 Multiple myeloma not having achieved remission: Secondary | ICD-10-CM

## 2016-10-11 DIAGNOSIS — Z79899 Other long term (current) drug therapy: Secondary | ICD-10-CM

## 2016-10-11 DIAGNOSIS — K7469 Other cirrhosis of liver: Secondary | ICD-10-CM

## 2016-10-11 DIAGNOSIS — R161 Splenomegaly, not elsewhere classified: Secondary | ICD-10-CM | POA: Diagnosis not present

## 2016-10-11 DIAGNOSIS — D509 Iron deficiency anemia, unspecified: Secondary | ICD-10-CM

## 2016-10-11 DIAGNOSIS — D500 Iron deficiency anemia secondary to blood loss (chronic): Secondary | ICD-10-CM

## 2016-10-11 MED ORDER — INSULIN DETEMIR 100 UNIT/ML ~~LOC~~ SOLN: 10.0000 [IU] | Freq: Every day | SUBCUTANEOUS | 5 refills | Status: DC

## 2016-10-11 MED ORDER — INSULIN GLARGINE 100 UNIT/ML ~~LOC~~ SOLN: 10.0000 [IU] | Freq: Every day | SUBCUTANEOUS | 11 refills | Status: DC

## 2016-10-11 NOTE — Progress Notes (Signed)
Patient here today for [place] results.  Wife states she wants to talk to MD regarding lab results from [facility] versus those taken at the hospital.

## 2016-10-11 NOTE — Telephone Encounter (Signed)
Sent levemir to pharmacy  inplace of lantus and notified patient sent same dose 10 units before QHS.

## 2016-10-11 NOTE — Telephone Encounter (Signed)
Called patient and LVM that patient should stop taking both types of cinnamon as it can cause liver damage.  Also should stop Coenzyme Q10 as it can cause increased liver enzymes.

## 2016-10-11 NOTE — Telephone Encounter (Signed)
I would recommend trying a standing dose of lantus insulin.  Start with 10 units of lantus q hs and use sliding scale during the day with meals only.  Would hold on going back on metformin or glipizide.  We can then adjust the lantus if blood sugars remain elevated.  Will need rx sent in for lantus.  Let me know if any questions or problems.

## 2016-10-11 NOTE — Telephone Encounter (Signed)
Patient wife (DPR) notified of new regimen adding lantus QHS to novolog sliding scale.

## 2016-10-11 NOTE — Telephone Encounter (Signed)
Pt spouse called asking for you. She would not tell me anymore other than it was about pt's insulin. Please advise,thank you!  Call [facility] @ [phone]

## 2016-10-11 NOTE — Telephone Encounter (Signed)
Patient wife call ed and stated that GI has patient taking Lactulose 4 x per day and patient is eating very strictly watching carb intake. Patient stated since being off Glipizide and Metformin and being on sliding scale patient CBG's have been 2874 to 3 44 fasting and 300 to over 300 at meals. Yesterday at lunch time around 1 PM patient CBG was 324 and patient advised wife he wanted his Metformin  and glipizide, and by 4 pm CBG was down to 250 and this morning CBG was 155, patient wife stated she did not know what to do continue sliding scale or go back to Metformin and glipizide.

## 2016-10-11 NOTE — Telephone Encounter (Signed)
Thank you Juliann Pulse.  Make sure that wife understands that he is to still check his blood sugar before each meal.  Use the sliding scale.  The levemir is a long acting medication.  Will increased dose as needed.  Do not give short acting insulin before bed.  Thanks

## 2016-10-12 ENCOUNTER — Encounter: Payer: Self-pay | Admitting: Gastroenterology

## 2016-10-12 ENCOUNTER — Telehealth: Payer: Self-pay | Admitting: Internal Medicine

## 2016-10-12 ENCOUNTER — Ambulatory Visit (INDEPENDENT_AMBULATORY_CARE_PROVIDER_SITE_OTHER): Payer: PPO | Admitting: Gastroenterology

## 2016-10-12 ENCOUNTER — Telehealth: Payer: Self-pay

## 2016-10-12 VITALS — BP 154/72 | HR 62 | Temp 98.1°F | Ht 69.0 in | Wt 168.0 lb

## 2016-10-12 DIAGNOSIS — K7290 Hepatic failure, unspecified without coma: Secondary | ICD-10-CM

## 2016-10-12 DIAGNOSIS — K7460 Unspecified cirrhosis of liver: Secondary | ICD-10-CM

## 2016-10-12 DIAGNOSIS — K7682 Hepatic encephalopathy: Secondary | ICD-10-CM

## 2016-10-12 DIAGNOSIS — R188 Other ascites: Principal | ICD-10-CM

## 2016-10-12 MED ORDER — INSULIN GLARGINE 100 UNIT/ML ~~LOC~~ SOLN: 10.0000 [IU] | Freq: Every day | SUBCUTANEOUS | 5 refills | Status: DC

## 2016-10-12 MED ORDER — INSULIN DETEMIR 100 UNIT/ML ~~LOC~~ SOLN: 10.0000 [IU] | Freq: Every day | SUBCUTANEOUS | 5 refills | Status: DC

## 2016-10-12 NOTE — Telephone Encounter (Signed)
Left message to return my call.  

## 2016-10-12 NOTE — Telephone Encounter (Signed)
Patient notified we are going to use lantus script resent to pharmacy.

## 2016-10-12 NOTE — Telephone Encounter (Signed)
Received fax from [facility] is not interested in services at this time.

## 2016-10-12 NOTE — Telephone Encounter (Signed)
Pt was notified during ov with Dr. Allen Norris today, 10/12/16.

## 2016-10-12 NOTE — Telephone Encounter (Signed)
Zachary Pulse, do you mind seeing what they need from the pharmacy.  Thanks

## 2016-10-12 NOTE — Telephone Encounter (Signed)
[facility] called regarding two new Rx that was sent yesterday pt needs refill for insulin pen not the valve novolog. Thank you!

## 2016-10-12 NOTE — Progress Notes (Signed)
Primary Care Physician: Einar Pheasant, MD  Primary Gastroenterologist:  Dr. [name]  Chief Complaint  Patient presents with  . Hospitalization Follow-up    HPI: GEOVANIE Buck is a 76 y.o. 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 This resulted in him getting hepatic encephalopathy. The patient was on that all of the past but had bad side effects from that therefore he had repeated EGDs with esophageal banding as primary prophylaxis. The patient states he has been doing well and his most recent labs have improved.  Current Outpatient Prescriptions  Medication Sig Dispense Refill  . Alpha-Lipoic Acid (LIPOIC ACID PO) Take 200 mg by mouth daily.     . Ascorbic Acid (VITAMIN C) 1000 MG tablet Take 1,000 mg by mouth daily.    . B Complex Vitamins (VITAMIN B COMPLEX) TABS Take by mouth daily.    Jolyne Loa Grape-Goldenseal (BERBERINE COMPLEX PO) Take 2,000 mg by mouth 2 (two) times daily.    . blood glucose meter kit and supplies KIT Dispense based on patient and insurance preference. Use up to four times daily as directed. E11.9 Please dispense Freestyle freedom meter and supplies. 1 each 0  . Cholecalciferol (VITAMIN D-3) 1000 UNITS CAPS Take by mouth.    . Cinnamon 500 MG capsule Take 500 mg by mouth.    . Coenzyme Q10 100 MG capsule Take 100 mg by mouth daily.     . furosemide (LASIX) 20 MG tablet Take 1 tablet (20 mg total) by mouth 2 (two) times daily. 60 tablet 3  . glipiZIDE (GLUCOTROL) 5 MG tablet Take 1 tablet (5 mg total) by mouth See admin instructions. 270 tablet 1  . glucose blood (ONE TOUCH ULTRA TEST) test strip Use as instructed 100 each 12  . glucose blood test strip Freestyle lite test strips, check blood sugar three times daily: Dx 250.00 100 each 3  . insulin aspart (NOVOLOG) 100 UNIT/ML  injection Inject 0-9 Units into the skin 3 (three) times daily with meals. 100 mL 1  . insulin glargine (LANTUS) 100 UNIT/ML injection Inject 0.1 mLs (10 Units total) into the skin at bedtime. 10 mL 5  . lactulose (CHRONULAC) 10 GM/15ML solution Take 30 mLs (20 g total) by mouth 4 (four) times daily. 946 mL 0  . Lancets (ONETOUCH ULTRASOFT) lancets Use as instructed 100 each 12  . lisinopril (PRINIVIL,ZESTRIL) 20 MG tablet TAKE ONE TABLET BY MOUTH EVERY DAY/ AM    . metFORMIN (GLUCOPHAGE) 1000 MG tablet TAKE 1 TABLET BY MOUTH TWICE A DAY WITH A MEAL 180 tablet 1  . MILK THISTLE PO Take 25 mg by mouth daily.    . Omega-3 Fatty Acids (FISH OIL) 1000 MG CAPS Take 4,000 mg by mouth daily.     . rifaximin (XIFAXAN) 550 MG TABS tablet Take 1 tablet (550 mg total) by mouth 2 (two) times daily. 60 tablet 11  . sildenafil (REVATIO) 20 MG tablet 3 to 5 tablets per day or as instructed    . spironolactone (ALDACTONE) 50 MG tablet Take 1 tablet (50 mg total) by mouth daily. 30 tablet 3  . Theanine 100 MG CAPS Take by mouth daily.    Marland Kitchen triamcinolone cream (KENALOG) 0.1 % Apply topically 2 (two) times daily. Please avoid face and genitalia area.  Do not use in the same spot for more  than 7-10 days in a row. Pharmacy changed to [facility]. 80 g 0  . Turmeric POWD 4,000 mg by Does not apply route daily.    . vitamin B-12 (CYANOCOBALAMIN) 100 MCG tablet Take 200 mcg by mouth daily.    Marland Kitchen zinc sulfate 220 (50 Zn) MG capsule Take 1 capsule (220 mg total) by mouth 2 (two) times daily.     No current facility-administered medications for this visit.    Facility-Administered Medications Ordered in Other Visits  Medication Dose Route Frequency Provider Last Rate Last Dose  . alteplase (CATHFLO ACTIVASE) injection 2 mg  2 mg Intracatheter Once PRN Nolon Stalls C, MD      . heparin lock flush 100 unit/mL  500 Units Intracatheter Once PRN Mike Gip, Melissa C, MD      . heparin lock flush 100 unit/mL  250 Units  Intracatheter Once PRN Nolon Stalls C, MD      . sodium chloride 0.9 % injection 10 mL  10 mL Intracatheter PRN Corcoran, Melissa C, MD      . sodium chloride 0.9 % injection 3 mL  3 mL Intravenous Once PRN Lequita Asal, MD        Allergies as of 10/12/2016 - Review Complete 10/12/2016  Allergen Reaction Noted  . Niaspan [niacin er] Other (See Comments) 03/19/2012    ROS:  General: Negative for anorexia, weight loss, fever, chills, fatigue, weakness. ENT: Negative for hoarseness, difficulty swallowing , nasal congestion. CV: Negative for chest pain, angina, palpitations, dyspnea on exertion, peripheral edema.  Respiratory: Negative for dyspnea at rest, dyspnea on exertion, cough, sputum, wheezing.  GI: See history of present illness. GU:  Negative for dysuria, hematuria, urinary incontinence, urinary frequency, nocturnal urination.  Endo: Negative for unusual weight change.    Physical Examination:   BP (!) 154/72   Pulse 62   Temp 98.1 F (36.7 C) (Oral)   Ht '5\' 9"'$  (1.753 m)   Wt 168 lb (76.2 kg)   BMI 24.81 kg/m   General: Well-nourished, well-developed in no acute distress.  Eyes: No icterus. Conjunctivae pink. Mouth: Oropharyngeal mucosa moist and pink , no lesions erythema or exudate. Lungs: Clear to auscultation bilaterally. Non-labored. Heart: Regular rate and rhythm, no murmurs rubs or gallops.  Abdomen: Bowel sounds are normal, nontender, nondistended, no hepatosplenomegaly or masses, no abdominal bruits or hernia , no rebound or guarding.   Extremities: No lower extremity edema. No clubbing or deformities. Neuro: Alert and oriented x 3.  Grossly intact. Skin: Warm and dry, no jaundice.   Psych: Alert and cooperative, normal mood and affect.  Labs:    Imaging Studies: Dg Chest 2 View  Result Date: 09/30/2016 CLINICAL DATA:  Altered mental status, history coronary artery disease post MI and stenting, diabetes mellitus, hypertension EXAM: CHEST  2  VIEW COMPARISON:  07/09/2013 FINDINGS: Upper normal heart size. Mediastinal contours and pulmonary vascularity normal. Atherosclerotic calcification aorta. Mild bronchitic changes without infiltrate, pleural effusion or pneumothorax. Suspected BILATERAL nipple shadows. Scattered endplate spur formation thoracic spine and mild osseous demineralization. IMPRESSION: Bronchitic changes without infiltrate. Question BILATERAL nipple shadows ; repeat PA chest radiograph with nipple markers recommended to exclude pulmonary nodule. Electronically Signed   By: Lavonia Dana M.D.   On: 09/30/2016 15:35   Ct Head Wo Contrast  Result Date: 09/30/2016 CLINICAL DATA:  Confusion, disorientation, difficulty finding words, history cirrhosis, multiple myeloma, diabetes mellitus, coronary artery disease post MI and stenting, hypertension EXAM: CT HEAD WITHOUT CONTRAST TECHNIQUE: Contiguous axial images  were obtained from the base of the skull through the vertex without intravenous contrast. Sagittal and coronal MPR images reconstructed from axial data set. COMPARISON:  None FINDINGS: Brain: Mild generalized atrophy. Normal ventricular morphology. No midline shift or mass effect. Otherwise normal appearance of brain parenchyma. No intracranial hemorrhage, mass lesion, or evidence acute infarction. No extra-axial fluid collections. Vascular: Atherosclerotic calcifications at skullbase Skull: Appears demineralized but intact Sinuses/Orbits: Clear Other: N/A IMPRESSION: No acute intracranial abnormalities. Electronically Signed   By: Lavonia Dana M.D.   On: 09/30/2016 15:41   Mr Brain Wo Contrast  Result Date: 10/02/2016 CLINICAL DATA:  Altered level of consciousness. Personal history of cirrhosis, anemia, coronary artery disease, multiple myeloma, and diabetes. EXAM: MRI HEAD WITHOUT CONTRAST TECHNIQUE: Multiplanar, multiecho pulse sequences of the brain and surrounding structures were obtained without intravenous contrast.  COMPARISON:  CT head without contrast 09/30/2016. FINDINGS: Brain: The diffusion-weighted images demonstrate no acute or subacute infarction. No acute hemorrhage or mass lesion is present. Periventricular and subcortical white matter changes are mildly advanced for age. Ventricles are of normal size. No significant extra-axial fluid collection is present. The brainstem and cerebellum are normal. Vascular: Flow is present in the major intracranial arteries. Skull and upper cervical spine: The skullbase is within normal limits. Midline sagittal structures are normal. The craniocervical junction is normal. Marrow signal is within normal limits. Degenerative changes are present in the upper cervical spine. Sinuses/Orbits: The paranasal sinuses and mastoid air cells are clear. The globes and orbits are within normal limits. IMPRESSION: 1. No acute intracranial abnormality. 2. Periventricular and subcortical white matter changes are mildly advanced for age. Given the patient's other comorbidities, this likely reflects the sequela of chronic microvascular ischemia. Electronically Signed   By: San Morelle M.D.   On: 10/02/2016 11:21   US Abdomen Complete  Result Date: 10/10/2016 CLINICAL DATA:  Cirrhosis, portal hypertension, stage II multiple myeloma EXAM: ABDOMEN ULTRASOUND COMPLETE COMPARISON:  CT abdomen dated 03/16/2015 FINDINGS: Gallbladder: No gallstones or wall thickening visualized. No sonographic Murphy sign noted by sonographer. Common bile duct: Diameter: 5 mm Liver: Nodular hepatic contour. No focal hepatic lesion is seen. Portal vein is patent on color Doppler imaging with normal direction of blood flow towards the liver. IVC: No abnormality visualized. Pancreas: Visualized portion unremarkable. Spleen: Enlarged, measuring 16.8 x 15.3 x 8.3 cm (calculated volume 1113 mL). Right Kidney: Length: 10.4 cm. Echogenicity within normal limits. No mass or hydronephrosis visualized. Left Kidney: Length:  11.0 cm. Echogenicity within normal limits. No mass or hydronephrosis visualized. Abdominal aorta: No aneurysm visualized. Other findings: Mild abdominal ascites. IMPRESSION: Nodular hepatic contour, suggesting cirrhosis. Patent portal vein with normal direction of flow. Splenomegaly. Electronically Signed   By: Julian Hy M.D.   On: 10/10/2016 10:06    Assessment and Plan:   Zachary Buck is a 76 y.o. 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 The patient is doing well now and will continue his present medications. The patient has been told to titrate his lactulose to having 2 formed bowel movements a day. He should not have diarrhea and gets dehydrated. The patient and his wife have been explained the plan and agree with it.    [name], MD. [name]   Note: This dictation was prepared with Dragon dictation along with smaller phrase technology. Any transcriptional errors that result from this process are unintentional.

## 2016-10-12 NOTE — Telephone Encounter (Signed)
This has been handled they do not want the pen patient wife does not she knows how to use the regular needle and pen would require teaching .

## 2016-10-12 NOTE — Telephone Encounter (Signed)
-----   Message from [name], MD sent at 10/10/2016  6:28 PM EDT ----- That the patient know that his kidney function is normal.

## 2016-10-13 ENCOUNTER — Telehealth: Payer: Self-pay | Admitting: Gastroenterology

## 2016-10-13 ENCOUNTER — Other Ambulatory Visit: Payer: Self-pay

## 2016-10-13 MED ORDER — LACTULOSE 10 GM/15ML PO SOLN: 20.0000 g | Freq: Four times a day (QID) | ORAL | 6 refills | Status: DC

## 2016-10-13 NOTE — Telephone Encounter (Signed)
Per [facility] has been called in

## 2016-10-13 NOTE — Telephone Encounter (Signed)
Patients wife left a voice message that Zachary Buck needs a refill on Lactulose. She would like for you to call her cell.  [phone]

## 2016-10-14 ENCOUNTER — Encounter: Payer: Self-pay | Admitting: Internal Medicine

## 2016-10-19 ENCOUNTER — Encounter: Payer: Self-pay | Admitting: Hematology and Oncology

## 2016-10-31 ENCOUNTER — Encounter: Payer: Self-pay | Admitting: Hematology and Oncology

## 2016-10-31 ENCOUNTER — Encounter: Payer: Self-pay | Admitting: Internal Medicine

## 2016-10-31 ENCOUNTER — Telehealth: Payer: Self-pay | Admitting: *Deleted

## 2016-10-31 NOTE — Telephone Encounter (Signed)
Called patient's wife and confirmed future appointments.

## 2016-11-01 ENCOUNTER — Other Ambulatory Visit: Payer: Self-pay

## 2016-11-01 ENCOUNTER — Telehealth: Payer: Self-pay | Admitting: *Deleted

## 2016-11-01 MED ORDER — GLUCOSE BLOOD VI STRP: ORAL_STRIP | 3 refills | Status: DC

## 2016-11-01 MED ORDER — "SYRINGE/NEEDLE (DISP) 30G X 1/2"" 1 ML MISC": 1.0000 | 6 refills | Status: AC

## 2016-11-01 MED ORDER — ONETOUCH ULTRASOFT LANCETS MISC: 12 refills | Status: AC

## 2016-11-01 NOTE — Telephone Encounter (Signed)
Pt wife stated that pt's will need syringes to be called into [facility]

## 2016-11-01 NOTE — Telephone Encounter (Signed)
See other my chart message pts wife sent for recommendations and notification.

## 2016-11-01 NOTE — Telephone Encounter (Signed)
Script sent  

## 2016-11-01 NOTE — Telephone Encounter (Signed)
Please call pts wife and inform her that I reviewed the sugar readings.  He is currently on lantus 10 units before bed and using a sliding scale with his meals.  I recommend continuing the sliding scale insulin as he is doing.  Increase lantus to 12 units before bed.  If after one week, fasting sugars are remaining above 200, then increase lantus to 14 units before bed.  Continue to monitor sugars and send in readings.  Do not give short acting insulin before bed (only lantus).  Let me know if any problems.  Thanks

## 2016-11-01 NOTE — Telephone Encounter (Signed)
Notified patient wife (DPR0 of new regimen for lantus and made sure she understood no short acting insulin at bedtime. Patient wife ask is the lactulose what is causing his sugars to be high.

## 2016-11-06 ENCOUNTER — Inpatient Hospital Stay: Payer: PPO | Attending: Hematology and Oncology

## 2016-11-06 DIAGNOSIS — I2510 Atherosclerotic heart disease of native coronary artery without angina pectoris: Secondary | ICD-10-CM | POA: Diagnosis not present

## 2016-11-06 DIAGNOSIS — I10 Essential (primary) hypertension: Secondary | ICD-10-CM | POA: Diagnosis not present

## 2016-11-06 DIAGNOSIS — D125 Benign neoplasm of sigmoid colon: Secondary | ICD-10-CM | POA: Diagnosis not present

## 2016-11-06 DIAGNOSIS — R161 Splenomegaly, not elsewhere classified: Secondary | ICD-10-CM | POA: Insufficient documentation

## 2016-11-06 DIAGNOSIS — D509 Iron deficiency anemia, unspecified: Secondary | ICD-10-CM | POA: Diagnosis not present

## 2016-11-06 DIAGNOSIS — J61 Pneumoconiosis due to asbestos and other mineral fibers: Secondary | ICD-10-CM | POA: Diagnosis not present

## 2016-11-06 DIAGNOSIS — K648 Other hemorrhoids: Secondary | ICD-10-CM | POA: Insufficient documentation

## 2016-11-06 DIAGNOSIS — K317 Polyp of stomach and duodenum: Secondary | ICD-10-CM | POA: Diagnosis not present

## 2016-11-06 DIAGNOSIS — J470 Bronchiectasis with acute lower respiratory infection: Secondary | ICD-10-CM | POA: Diagnosis not present

## 2016-11-06 DIAGNOSIS — Z79899 Other long term (current) drug therapy: Secondary | ICD-10-CM | POA: Insufficient documentation

## 2016-11-06 DIAGNOSIS — D500 Iron deficiency anemia secondary to blood loss (chronic): Secondary | ICD-10-CM

## 2016-11-06 DIAGNOSIS — I864 Gastric varices: Secondary | ICD-10-CM | POA: Diagnosis not present

## 2016-11-06 DIAGNOSIS — E7800 Pure hypercholesterolemia, unspecified: Secondary | ICD-10-CM | POA: Insufficient documentation

## 2016-11-06 DIAGNOSIS — N400 Enlarged prostate without lower urinary tract symptoms: Secondary | ICD-10-CM | POA: Insufficient documentation

## 2016-11-06 DIAGNOSIS — K760 Fatty (change of) liver, not elsewhere classified: Secondary | ICD-10-CM | POA: Insufficient documentation

## 2016-11-06 DIAGNOSIS — K7682 Hepatic encephalopathy: Secondary | ICD-10-CM

## 2016-11-06 DIAGNOSIS — K7469 Other cirrhosis of liver: Secondary | ICD-10-CM | POA: Diagnosis not present

## 2016-11-06 DIAGNOSIS — I252 Old myocardial infarction: Secondary | ICD-10-CM | POA: Insufficient documentation

## 2016-11-06 DIAGNOSIS — D696 Thrombocytopenia, unspecified: Secondary | ICD-10-CM | POA: Diagnosis not present

## 2016-11-06 DIAGNOSIS — K766 Portal hypertension: Secondary | ICD-10-CM | POA: Diagnosis not present

## 2016-11-06 DIAGNOSIS — D122 Benign neoplasm of ascending colon: Secondary | ICD-10-CM | POA: Insufficient documentation

## 2016-11-06 DIAGNOSIS — J849 Interstitial pulmonary disease, unspecified: Secondary | ICD-10-CM | POA: Insufficient documentation

## 2016-11-06 DIAGNOSIS — I8510 Secondary esophageal varices without bleeding: Secondary | ICD-10-CM | POA: Diagnosis not present

## 2016-11-06 DIAGNOSIS — Z955 Presence of coronary angioplasty implant and graft: Secondary | ICD-10-CM | POA: Diagnosis not present

## 2016-11-06 DIAGNOSIS — C9000 Multiple myeloma not having achieved remission: Secondary | ICD-10-CM | POA: Diagnosis not present

## 2016-11-06 DIAGNOSIS — Z77090 Contact with and (suspected) exposure to asbestos: Secondary | ICD-10-CM | POA: Diagnosis not present

## 2016-11-06 DIAGNOSIS — K7290 Hepatic failure, unspecified without coma: Secondary | ICD-10-CM | POA: Insufficient documentation

## 2016-11-06 LAB — COMPREHENSIVE METABOLIC PANEL
ALT: 28 U/L (ref 17–63)
AST: 31 U/L (ref 15–41)
Albumin: 2.9 g/dL — ABNORMAL LOW (ref 3.5–5.0)
Alkaline Phosphatase: 70 U/L (ref 38–126)
Anion gap: 6 (ref 5–15)
BUN: 22 mg/dL — ABNORMAL HIGH (ref 6–20)
CO2: 24 mmol/L (ref 22–32)
Calcium: 9.5 mg/dL (ref 8.9–10.3)
Chloride: 106 mmol/L (ref 101–111)
Creatinine, Ser: 1.42 mg/dL — ABNORMAL HIGH (ref 0.61–1.24)
GFR calc Af Amer: 54 mL/min — ABNORMAL LOW (ref >60)
GFR calc non Af Amer: 46 mL/min — ABNORMAL LOW (ref >60)
Glucose, Bld: 255 mg/dL — ABNORMAL HIGH (ref 65–99)
Potassium: 4.1 mmol/L (ref 3.5–5.1)
Sodium: 136 mmol/L (ref 135–145)
Total Bilirubin: 1.2 mg/dL (ref 0.3–1.2)
Total Protein: 7.5 g/dL (ref 6.5–8.1)

## 2016-11-06 LAB — CBC WITH DIFFERENTIAL/PLATELET
Basophils Absolute: 0 10*3/uL (ref 0–0.1)
Basophils Relative: 1 %
Eosinophils Absolute: 0.4 10*3/uL (ref 0–0.7)
Eosinophils Relative: 8 %
HCT: 32 % — ABNORMAL LOW (ref 40.0–52.0)
Hemoglobin: 11.3 g/dL — ABNORMAL LOW (ref 13.0–18.0)
Lymphocytes Relative: 25 %
Lymphs Abs: 1.2 10*3/uL (ref 1.0–3.6)
MCH: 30.3 pg (ref 26.0–34.0)
MCHC: 35.2 g/dL (ref 32.0–36.0)
MCV: 85.9 fL (ref 80.0–100.0)
Monocytes Absolute: 0.5 10*3/uL (ref 0.2–1.0)
Monocytes Relative: 11 %
Neutro Abs: 2.6 10*3/uL (ref 1.4–6.5)
Neutrophils Relative %: 55 %
Platelets: 79 10*3/uL — ABNORMAL LOW (ref 150–440)
RBC: 3.72 MIL/uL — ABNORMAL LOW (ref 4.40–5.90)
RDW: 15.6 % — ABNORMAL HIGH (ref 11.5–14.5)
WBC: 4.7 10*3/uL (ref 3.8–10.6)

## 2016-11-06 LAB — FERRITIN: Ferritin: 35 ng/mL (ref 24–336)

## 2016-11-07 LAB — PROTEIN ELECTROPHORESIS, SERUM
A/G Ratio: 0.7 (ref 0.7–1.7)
Albumin ELP: 3 g/dL (ref 2.9–4.4)
Alpha-1-Globulin: 0.2 g/dL (ref 0.0–0.4)
Alpha-2-Globulin: 0.5 g/dL (ref 0.4–1.0)
Beta Globulin: 0.8 g/dL (ref 0.7–1.3)
Gamma Globulin: 2.6 g/dL — ABNORMAL HIGH (ref 0.4–1.8)
Globulin, Total: 4.1 g/dL — ABNORMAL HIGH (ref 2.2–3.9)
M-Spike, %: 2.4 g/dL — ABNORMAL HIGH
Total Protein ELP: 7.1 g/dL (ref 6.0–8.5)

## 2016-11-07 LAB — AFP TUMOR MARKER: AFP, Serum, Tumor Marker: 1.3 ng/mL (ref 0.0–8.3)

## 2016-11-08 ENCOUNTER — Telehealth: Payer: Self-pay | Admitting: *Deleted

## 2016-11-08 NOTE — Telephone Encounter (Signed)
-----   Message from Lequita Asal, MD sent at 11/08/2016  9:03 AM EDT ----- Regarding: Please call patient  M spike down a little (2.4)  M  ----- Message ----- From: Interface, Lab In [place] Sent: 11/06/2016   8:56 AM To: Lequita Asal, MD

## 2016-11-08 NOTE — Telephone Encounter (Signed)
Called patient and LVM that M-Spike is down to 2.4.

## 2016-11-09 ENCOUNTER — Encounter: Payer: Self-pay | Admitting: Internal Medicine

## 2016-11-09 NOTE — Telephone Encounter (Signed)
This is the pts wife you have talked to previously.  Ok to send in rx for pens and needles needed.  Juliann Pulse, do you mind and make sure she is aware of how to use pens.  Thanks

## 2016-11-10 ENCOUNTER — Encounter: Payer: Self-pay | Admitting: Internal Medicine

## 2016-11-10 ENCOUNTER — Encounter: Payer: Self-pay | Admitting: Gastroenterology

## 2016-11-10 MED ORDER — PEN NEEDLES 31G X 6 MM MISC: 1.0000 "application " | Freq: Four times a day (QID) | 4 refills | Status: AC

## 2016-11-10 MED ORDER — INSULIN ASPART 100 UNIT/ML FLEXPEN: PEN_INJECTOR | SUBCUTANEOUS | 11 refills | Status: DC

## 2016-11-10 NOTE — Telephone Encounter (Signed)
See other mychart message for response.

## 2016-11-13 ENCOUNTER — Encounter: Payer: Self-pay | Admitting: Internal Medicine

## 2016-11-13 NOTE — Telephone Encounter (Signed)
Zachary Buck, can you help clarify this for me.  Let me know what I need to do.  Thanks

## 2016-11-14 MED ORDER — INSULIN GLARGINE 100 UNITS/ML SOLOSTAR PEN: 10.0000 [IU] | PEN_INJECTOR | Freq: Every day | SUBCUTANEOUS | 11 refills | Status: AC

## 2016-11-15 ENCOUNTER — Telehealth: Payer: Self-pay | Admitting: Internal Medicine

## 2016-11-15 NOTE — Telephone Encounter (Signed)
Pharmacy called and stated that pt is waiting for his insulin glargine (LANTUS) 100 unit/mL SOPN to be called in. Please advise, thank you!

## 2016-11-15 NOTE — Telephone Encounter (Signed)
[name] and they stated that they did not receive the fax from yesterday gave a verbal for the medication.

## 2016-11-16 NOTE — Telephone Encounter (Signed)
It looks like this was sent in.  Can you please clarify.  Thanks

## 2016-11-17 NOTE — Telephone Encounter (Signed)
Patient has picked up script per pharmacy.

## 2016-11-21 LAB — HM DIABETES EYE EXAM

## 2016-12-06 ENCOUNTER — Inpatient Hospital Stay: Payer: PPO | Attending: Hematology and Oncology

## 2016-12-06 DIAGNOSIS — I8510 Secondary esophageal varices without bleeding: Secondary | ICD-10-CM | POA: Insufficient documentation

## 2016-12-06 DIAGNOSIS — R161 Splenomegaly, not elsewhere classified: Secondary | ICD-10-CM | POA: Insufficient documentation

## 2016-12-06 DIAGNOSIS — J61 Pneumoconiosis due to asbestos and other mineral fibers: Secondary | ICD-10-CM | POA: Insufficient documentation

## 2016-12-06 DIAGNOSIS — N400 Enlarged prostate without lower urinary tract symptoms: Secondary | ICD-10-CM | POA: Diagnosis not present

## 2016-12-06 DIAGNOSIS — K766 Portal hypertension: Secondary | ICD-10-CM | POA: Insufficient documentation

## 2016-12-06 DIAGNOSIS — C9000 Multiple myeloma not having achieved remission: Secondary | ICD-10-CM | POA: Diagnosis not present

## 2016-12-06 DIAGNOSIS — D509 Iron deficiency anemia, unspecified: Secondary | ICD-10-CM | POA: Diagnosis not present

## 2016-12-06 DIAGNOSIS — Z79899 Other long term (current) drug therapy: Secondary | ICD-10-CM | POA: Diagnosis not present

## 2016-12-06 DIAGNOSIS — K529 Noninfective gastroenteritis and colitis, unspecified: Secondary | ICD-10-CM | POA: Diagnosis not present

## 2016-12-06 DIAGNOSIS — J189 Pneumonia, unspecified organism: Secondary | ICD-10-CM | POA: Insufficient documentation

## 2016-12-06 DIAGNOSIS — Z23 Encounter for immunization: Secondary | ICD-10-CM | POA: Insufficient documentation

## 2016-12-06 DIAGNOSIS — J849 Interstitial pulmonary disease, unspecified: Secondary | ICD-10-CM | POA: Insufficient documentation

## 2016-12-06 DIAGNOSIS — K648 Other hemorrhoids: Secondary | ICD-10-CM | POA: Insufficient documentation

## 2016-12-06 DIAGNOSIS — K2970 Gastritis, unspecified, without bleeding: Secondary | ICD-10-CM | POA: Diagnosis not present

## 2016-12-06 DIAGNOSIS — I252 Old myocardial infarction: Secondary | ICD-10-CM | POA: Diagnosis not present

## 2016-12-06 DIAGNOSIS — I10 Essential (primary) hypertension: Secondary | ICD-10-CM | POA: Diagnosis not present

## 2016-12-06 DIAGNOSIS — K317 Polyp of stomach and duodenum: Secondary | ICD-10-CM | POA: Insufficient documentation

## 2016-12-06 DIAGNOSIS — K760 Fatty (change of) liver, not elsewhere classified: Secondary | ICD-10-CM | POA: Diagnosis not present

## 2016-12-06 DIAGNOSIS — K7469 Other cirrhosis of liver: Secondary | ICD-10-CM | POA: Diagnosis not present

## 2016-12-06 DIAGNOSIS — I2510 Atherosclerotic heart disease of native coronary artery without angina pectoris: Secondary | ICD-10-CM | POA: Diagnosis not present

## 2016-12-06 DIAGNOSIS — J470 Bronchiectasis with acute lower respiratory infection: Secondary | ICD-10-CM | POA: Diagnosis not present

## 2016-12-06 DIAGNOSIS — K7290 Hepatic failure, unspecified without coma: Secondary | ICD-10-CM

## 2016-12-06 DIAGNOSIS — D500 Iron deficiency anemia secondary to blood loss (chronic): Secondary | ICD-10-CM

## 2016-12-06 DIAGNOSIS — K7682 Hepatic encephalopathy: Secondary | ICD-10-CM

## 2016-12-06 LAB — COMPREHENSIVE METABOLIC PANEL
ALT: 27 U/L (ref 17–63)
AST: 29 U/L (ref 15–41)
Albumin: 3 g/dL — ABNORMAL LOW (ref 3.5–5.0)
Alkaline Phosphatase: 78 U/L (ref 38–126)
Anion gap: 4 — ABNORMAL LOW (ref 5–15)
BUN: 19 mg/dL (ref 6–20)
CO2: 22 mmol/L (ref 22–32)
Calcium: 9.5 mg/dL (ref 8.9–10.3)
Chloride: 109 mmol/L (ref 101–111)
Creatinine, Ser: 1.33 mg/dL — ABNORMAL HIGH (ref 0.61–1.24)
GFR calc Af Amer: 58 mL/min — ABNORMAL LOW (ref >60)
GFR calc non Af Amer: 50 mL/min — ABNORMAL LOW (ref >60)
Glucose, Bld: 243 mg/dL — ABNORMAL HIGH (ref 65–99)
Potassium: 4.5 mmol/L (ref 3.5–5.1)
Sodium: 135 mmol/L (ref 135–145)
Total Bilirubin: 1 mg/dL (ref 0.3–1.2)
Total Protein: 7.8 g/dL (ref 6.5–8.1)

## 2016-12-06 LAB — CBC WITH DIFFERENTIAL/PLATELET
Basophils Absolute: 0 10*3/uL (ref 0–0.1)
Basophils Relative: 1 %
Eosinophils Absolute: 0.4 10*3/uL (ref 0–0.7)
Eosinophils Relative: 7 %
HCT: 34 % — ABNORMAL LOW (ref 40.0–52.0)
Hemoglobin: 11.6 g/dL — ABNORMAL LOW (ref 13.0–18.0)
Lymphocytes Relative: 25 %
Lymphs Abs: 1.4 10*3/uL (ref 1.0–3.6)
MCH: 29.5 pg (ref 26.0–34.0)
MCHC: 34.1 g/dL (ref 32.0–36.0)
MCV: 86.4 fL (ref 80.0–100.0)
Monocytes Absolute: 0.6 10*3/uL (ref 0.2–1.0)
Monocytes Relative: 11 %
Neutro Abs: 3.3 10*3/uL (ref 1.4–6.5)
Neutrophils Relative %: 56 %
Platelets: 85 10*3/uL — ABNORMAL LOW (ref 150–440)
RBC: 3.93 MIL/uL — ABNORMAL LOW (ref 4.40–5.90)
RDW: 15.6 % — ABNORMAL HIGH (ref 11.5–14.5)
WBC: 5.7 10*3/uL (ref 3.8–10.6)

## 2016-12-06 LAB — FERRITIN: Ferritin: 54 ng/mL (ref 24–336)

## 2016-12-06 NOTE — Progress Notes (Signed)
Zachary Buck is a 76 y.o. male  with stage II multiple myeloma and iron deficiency anemia who is seen for 2 month assessment.  HPI: The patient was last seen in the medical oncology clinic on 10/11/2016.  At that time, patient was feeling well. Patient had no physical complaints. He was status post a 4 day admission for hepatic encephalopathy. Patient and wife both felt as if patient's mental status have returned to baseline. Patient was discharged from the hospital on Xifaxan and Lactulose. He is following up as an outpatient with Dr. Allen Norris.  CMP on 08/21 demonstrated normal electrolytes and renal function. His glucose was elevated at 213. Patient was started on insulin while in the hospital, however his wife has refused to allow him to take it.  Discussed the need to treat patient's myeloma, however patient's wife states that she will continue to treat his disease with herbal supplements. Patient was encouraged to stop cinnamon and CoQ10 due to increased riskfor elevated LFTs.  Labs on 11/06/2016 revealed a WBC 4700 with an [place] of 2600. Hemoglobin was 11.3, hematocrit 32.0, platelets 79,000. CMP revealed normal electrolytes. Glucose continued to elevated at 255. There was a decrease in his renal function; BUN 22 with a creatinine of 1.42. Albumin was low at 2.9. Ferritin normal at 35. SPEP demonstrated a decrease in the M-spike to 2.4 gm/dL. AFP was normal at 1.3.  Labs on 12/06/2016 revealed a WBC 5700 with an [place] of 3300. Hemoglobin was 11.6, hematocrit 34.0, and platelets 85,000. CMP revealed normal electrolytes. Glucose continues to be elevated at 243. Renal function has improved somewhat; BUN 19 with a creatinine of 1.33 (CrCl 51 mL/min). Ferritin was 54.   Symptomatically, he feels "good". Patient denies physical complaints today. Patient denies B symptoms and interval infections. Patient is  eating well, with no demonstrated weight loss. His mentation is a baseline, with no recent episodes of confusion. Patient continues to have elevated blood sugar levels. He has consistently been over 300.  Wife attributes elevated blood sugar levels to patient being on Lactulose.    Past Medical History:  Diagnosis Date  . Anemia   . Atherosclerotic heart disease    s/p MI, s/p stent mid circumflex  . CAD (coronary artery disease)    Dr Nehemiah Massed, cardiologist  . Cirrhosis of liver ([place])   . Clavicle fracture   . Diabetes mellitus without complication ([place])    oral med  . Elevated blood sugar    02/03/16 and 02/09/16 had BS over 400.  otherwise running around 250  . Esophageal varices ([place])   . Hepatic encephalopathy ([place])   . Hypercholesterolemia   . Hypertension    controlled on meds  . Internal hemorrhoids   . Leg fracture   . Multiple myeloma ([place])   . Myocardial infarct ([place])   . Prostate enlargement   . Tubular adenoma of colon     Past Surgical History:  Procedure Laterality Date  . COLONOSCOPY WITH PROPOFOL N/A 03/04/2015   Procedure: COLONOSCOPY WITH PROPOFOL;  Surgeon: Jerene Bears, MD;  Location: [facility];  Service: Gastroenterology;  Laterality: N/A;  . COLONOSCOPY WITH PROPOFOL N/A 12/09/2015   Procedure: COLONOSCOPY WITH PROPOFOL;  Surgeon: [name], MD;  Location: [place];  Service: Endoscopy;  Laterality: N/A;  DIABETIC-ORAL MEDS  . CORONARY ANGIOPLASTY WITH STENT PLACEMENT     2 stents  . ESOPHAGOGASTRODUODENOSCOPY (EGD) WITH  PROPOFOL N/A 03/04/2015   Procedure: ESOPHAGOGASTRODUODENOSCOPY (EGD) WITH PROPOFOL;  Surgeon: Jerene Bears, MD;  Location: [facility];  Service: Gastroenterology;  Laterality: N/A;  . ESOPHAGOGASTRODUODENOSCOPY (EGD) WITH PROPOFOL N/A 12/09/2015   Procedure: ESOPHAGOGASTRODUODENOSCOPY (EGD) WITH PROPOFOL;  Surgeon: [name], MD;  Location: [place];  Service: Endoscopy;  Laterality: N/A;  .  ESOPHAGOGASTRODUODENOSCOPY (EGD) WITH PROPOFOL N/A 12/21/2015   Procedure: ESOPHAGOGASTRODUODENOSCOPY (EGD) WITH PROPOFOL;  Surgeon: [name], MD;  Location: [facility];  Service: Endoscopy;  Laterality: N/A;  . ESOPHAGOGASTRODUODENOSCOPY (EGD) WITH PROPOFOL N/A 02/24/2016   Procedure: ESOPHAGOGASTRODUODENOSCOPY (EGD) WITH PROPOFOL;  Surgeon: [name], MD;  Location: [place];  Service: Endoscopy;  Laterality: N/A;  diabetic - oral med  . POLYPECTOMY N/A 12/09/2015   Procedure: POLYPECTOMY;  Surgeon: [name], MD;  Location: [place];  Service: Endoscopy;  Laterality: N/A;  . TONSILLECTOMY      Family History  Problem Relation Age of Onset  . Aneurysm Father 44       of the heart  . Heart attack Father   . Diabetes Mother     Social History:  reports that he has never smoked. He has never used smokeless tobacco. He reports that he does not drink alcohol or use drugs.  Patient denies any exposure to radiation or toxins.  He is a Company secretary.  He lives in [place].  The patient is accompanied by his wife, [name], today.  Allergies:  Allergies  Allergen Reactions  . Niaspan [Niacin Er] Other (See Comments)    Just does not want to take     Current Medications: Current Outpatient Prescriptions  Medication Sig Dispense Refill  . Alpha-Lipoic Acid (LIPOIC ACID PO) Take 200 mg by mouth daily.     . Ascorbic Acid (VITAMIN C) 1000 MG tablet Take 1,000 mg by mouth daily.    . B Complex Vitamins (VITAMIN B COMPLEX) TABS Take by mouth daily.    Jolyne Loa Grape-Goldenseal (BERBERINE COMPLEX PO) Take 2,000 mg by mouth 2 (two) times daily.    . blood glucose meter kit and supplies KIT Dispense based on patient and insurance preference. Use up to four times daily as directed. E11.9 Please dispense Freestyle freedom meter and supplies. 1 each 0  . Cholecalciferol (VITAMIN D-3) 1000 UNITS CAPS Take by mouth.    . Cinnamon 500 MG capsule Take 500 mg by mouth.     . Coenzyme Q10 100 MG capsule Take 100 mg by mouth daily.     . furosemide (LASIX) 20 MG tablet Take 1 tablet (20 mg total) by mouth 2 (two) times daily. 60 tablet 3  . glipiZIDE (GLUCOTROL) 5 MG tablet Take 1 tablet (5 mg total) by mouth See admin instructions. 270 tablet 1  . glucose blood (ONE TOUCH ULTRA TEST) test strip Use as instructed 100 each 12  . glucose blood test strip Freestyle lite test strips, check blood sugar three times daily: Dx 250.00 100 each 3  . insulin aspart (NOVOLOG FLEXPEN) 100 UNIT/ML FlexPen Inject 0-9 Units into the skin 3 (three) times daily with meals. 15 mL 11  . insulin glargine (LANTUS) 100 unit/mL SOPN Inject 0.1 mLs (10 Units total) into the skin at bedtime. 15 mL 11  . Insulin Pen Needle (PEN NEEDLES) 31G X 6 MM MISC 1 application by Does not apply route 4 (four) times daily. 200 each 4  . lactulose (CHRONULAC) 10 GM/15ML solution Take 30 mLs (20 g total) by mouth 4 (four) times  daily. 946 mL 6  . Lancets (ONETOUCH ULTRASOFT) lancets Use as instructed 100 each 12  . lisinopril (PRINIVIL,ZESTRIL) 20 MG tablet TAKE ONE TABLET BY MOUTH EVERY DAY/ AM    . metFORMIN (GLUCOPHAGE) 1000 MG tablet TAKE 1 TABLET BY MOUTH TWICE A DAY WITH A MEAL 180 tablet 1  . MILK THISTLE PO Take 25 mg by mouth daily.    . Omega-3 Fatty Acids (FISH OIL) 1000 MG CAPS Take 4,000 mg by mouth daily.     . rifaximin (XIFAXAN) 550 MG TABS tablet Take 1 tablet (550 mg total) by mouth 2 (two) times daily. 60 tablet 11  . sildenafil (REVATIO) 20 MG tablet 3 to 5 tablets per day or as instructed    . spironolactone (ALDACTONE) 50 MG tablet Take 1 tablet (50 mg total) by mouth daily. 30 tablet 3  . Syringe/Needle, Disp, 30G X 1/2" 1 ML MISC 1 Syringe by Does not apply route as directed. 150 each 6  . Theanine 100 MG CAPS Take by mouth daily.    Marland Kitchen triamcinolone cream (KENALOG) 0.1 % Apply topically 2 (two) times daily. Please avoid face and genitalia area.  Do not use in the same spot for more  than 7-10 days in a row. Pharmacy changed to [facility]. 80 g 0  . Turmeric POWD 4,000 mg by Does not apply route daily.    . vitamin B-12 (CYANOCOBALAMIN) 100 MCG tablet Take 200 mcg by mouth daily.    Marland Kitchen zinc sulfate 220 (50 Zn) MG capsule Take 1 capsule (220 mg total) by mouth 2 (two) times daily.     No current facility-administered medications for this visit.    Facility-Administered Medications Ordered in Other Visits  Medication Dose Route Frequency Provider Last Rate Last Dose  . alteplase (CATHFLO ACTIVASE) injection 2 mg  2 mg Intracatheter Once PRN Nolon Stalls C, MD      . heparin lock flush 100 unit/mL  500 Units Intracatheter Once PRN Mike Gip, Melissa C, MD      . heparin lock flush 100 unit/mL  250 Units Intracatheter Once PRN Mike Gip, Melissa C, MD      . sodium chloride 0.9 % injection 10 mL  10 mL Intracatheter PRN Corcoran, Melissa C, MD      . sodium chloride 0.9 % injection 3 mL  3 mL Intravenous Once PRN Lequita Asal, MD        Review of Systems:  GENERAL:  Feels "ok".  No fevers or sweats.  Weight up 1 pound. PERFORMANCE STATUS (ECOG):  1 HEENT:  No visual changes, runny nose, sore throat, mouth sores or tenderness. Lungs: No shortness of breath or cough.  No hemoptysis. Cardiac:  No chest pain, palpitations, orthopnea, or PND. GI:  No nausea, vomiting, constipation, or hematochezia.  GU:  No urgency, frequency, dysuria, or hematuria. Musculoskeletal:  No back pain.  No joint pain.  No muscle tenderness. Extremities:  No pain or swelling. Skin:  No rashes or skin changes. Neuro:  Short term memory issues.  Trouble concentrating.  Interval encephalopathy resolved with lactulose.  No headache, numbness or weakness, balance or coordination issues. Endocrine:  Diabetes (blood sugar in the 300s).  No thyroid issues, hot flashes or night sweats. Psych:  No mood changes, depression or anxiety. Pain:  No pain. Review of systems:  All other systems reviewed  and found to be negative.  Physical Exam: BP (!) 159/70 (BP Location: Right Arm, Patient Position: Sitting)   Pulse 69  Temp 98 F (36.7 C) (Tympanic)   Resp 16   Wt 169 lb 8 oz (76.9 kg)   BMI 25.03 kg/m   GENERAL:  Well developed, well nourished, gentleman sitting comfortably in the exam room in no acute distress. MENTAL STATUS:  Alert and oriented to person, place and time. HEAD:  Pearline Cables hair.  Normocephalic, atraumatic, face symmetric, no Cushingoid features. EYES:  Glasses.  Blue eyes.  Pupils equal round and reactive to light and accomodation.  No conjunctivitis or scleral icterus. ENT:  Oropharynx clear without lesion.  Tongue normal. Mucous membranes moist.  RESPIRATORY:  Clear to auscultation without rales, wheezes or rhonchi. CARDIOVASCULAR:  Regular rate and rhythm without murmur, rub or gallop. ABDOMEN:  Soft, non tender with active bowel sounds, and no appreciable hepatosplenomegaly.  No masses.  No guarding or rebound tenderness. SKIN:  No rashes, ulcers or lesions. EXTREMITIES: No edema, no skin discoloration or tenderness.  No palpable cords. LYMPH NODES: No palpable cervical, supraclavicular, axillary or inguinal adenopathy  NEUROLOGICAL: Unremarkable. PSYCH:  Appropriate.    Appointment on 12/06/2016  Component Date Value Ref Range Status  . WBC 12/06/2016 5.7  3.8 - 10.6 K/uL Final  . RBC 12/06/2016 3.93* 4.40 - 5.90 MIL/uL Final  . Hemoglobin 12/06/2016 11.6* 13.0 - 18.0 g/dL Final  . HCT 12/06/2016 34.0* 40.0 - 52.0 % Final  . MCV 12/06/2016 86.4  80.0 - 100.0 fL Final  . MCH 12/06/2016 29.5  26.0 - 34.0 pg Final  . MCHC 12/06/2016 34.1  32.0 - 36.0 g/dL Final  . RDW 12/06/2016 15.6* 11.5 - 14.5 % Final  . Platelets 12/06/2016 85* 150 - 440 K/uL Final  . Neutrophils Relative % 12/06/2016 56  % Final  . Neutro Abs 12/06/2016 3.3  1.4 - 6.5 K/uL Final  . Lymphocytes Relative 12/06/2016 25  % Final  . Lymphs Abs 12/06/2016 1.4  1.0 - 3.6 K/uL Final  .  Monocytes Relative 12/06/2016 11  % Final  . Monocytes Absolute 12/06/2016 0.6  0.2 - 1.0 K/uL Final  . Eosinophils Relative 12/06/2016 7  % Final  . Eosinophils Absolute 12/06/2016 0.4  0 - 0.7 K/uL Final  . Basophils Relative 12/06/2016 1  % Final  . Basophils Absolute 12/06/2016 0.0  0 - 0.1 K/uL Final  . Sodium 12/06/2016 135  135 - 145 mmol/L Final  . Potassium 12/06/2016 4.5  3.5 - 5.1 mmol/L Final  . Chloride 12/06/2016 109  101 - 111 mmol/L Final  . CO2 12/06/2016 22  22 - 32 mmol/L Final  . Glucose, Bld 12/06/2016 243* 65 - 99 mg/dL Final  . BUN 12/06/2016 19  6 - 20 mg/dL Final  . Creatinine, Ser 12/06/2016 1.33* 0.61 - 1.24 mg/dL Final  . Calcium 12/06/2016 9.5  8.9 - 10.3 mg/dL Final  . Total Protein 12/06/2016 7.8  6.5 - 8.1 g/dL Final  . Albumin 12/06/2016 3.0* 3.5 - 5.0 g/dL Final  . AST 12/06/2016 29  15 - 41 U/L Final  . ALT 12/06/2016 27  17 - 63 U/L Final  . Alkaline Phosphatase 12/06/2016 78  38 - 126 U/L Final  . Total Bilirubin 12/06/2016 1.0  0.3 - 1.2 mg/dL Final  . GFR calc non Af Amer 12/06/2016 50* >60 mL/min Final  . GFR calc Af Amer 12/06/2016 58* >60 mL/min Final   Comment: (NOTE) The eGFR has been calculated using the CKD EPI equation. This calculation has not been validated in all clinical situations. eGFR's persistently <60 mL/min  signify possible Chronic Kidney Disease.   . Anion gap 12/06/2016 4* 5 - 15 Final  . Ferritin 12/06/2016 54  24 - 336 ng/mL Final    Assessment:  DEMARRIO MENGES is a 76 y.o. male with iron deficiency anemia and stage II multiple myeloma. 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 Serum immunoglobulins noted an IgG of 2930 (high). 24-hour urine revealed a 8.8% monoclonal protein (10.6 mg/24 hours).  Albumen was 3.3 on 08/26/2014. He had pneumonia in 05/2014.   Bone survey on 10/20/2014 revealed no lytic lesions. Bone survey  on 09/16/2015 revealed no focal lytic lesion or acute bony abnormality.   SPEP has been followed:  2.3 gm/dL on 10/09/2014, 2.3 gm/dL on 01/18/2015, 2.3 gm/dL on 03/22/2015, 2.3 gm/dL on 06/18/2015, 2.3 gm/dL on 09/13/2015, 2.3 gm/dL on 11/08/2015, 2.7 gm/dL on 01/11/2016, 2.8 gm/dL on 02/17/2016, 2.4 gm/dL on 03/28/2016, 2.8 gm/dL on 04/24/2016, 2.3 gm/dL on 06/01/2016, 2.5 on 07/03/2016, 2.7 on 08/03/2016, 2.8 on 08/30/2016, 2.5 on 10/04/2016.   Lambda free light chainswere155.3 (ratio 0.15) on 01/18/2015, 174.59 (ratio of 0.16)on 06/18/2015, 182.9 (ratio of 0.13)on 09/13/2015, 205 (ratio 0.11) on 11/08/2015, 292.7 (ratio 0.09) on 01/11/2016, and 167.0 (ratio 0.13) on 03/27/2016.  Bone marrow on 11/10/2014 revealed a 10% monoclonal plasma cell infiltrate. Marrow was hypercellular for age (40-50%) with mixed maturing hematopoiesis, relative erythroid hyperplasia and mild nonspecific dyserythropoiesis. There was patchy mild focally moderate increase in reticulin. There were no significant iron stores. Myeloma FISH panel revealed t(11;14) which results in fusion of CCND1 (BCL1) at 11q13 with the immunoglobulin heavy chain gene (IgH) at 14q32 (a favorable prognostic feature).   Bone marrow on 03/14/2016 revealed persistent monoclonal plasma cell infiltrate of 10-15%.  Marrow was hypercellular for age (40-50%) with trilineage hematopoiesis, increase eosinophils, adequate megakaryocytes and no increase in blasts. There was mild patchy increase in reticulin fibers. There was stainable iron consistent with recent parenteral iron.  Cytogenetics were normal (46, XY). FISH studies revealed CCND1/IGH translocation t(11;14).  Beta2 microglobulin was 4.2 on 11/24/2014, 3.8 on 06/18/2015, 4.5 on 09/13/2015, 4.5 on 10/18/2015, and 4.6 on 04/24/2016.  PET scan on 12/07/2014 revealed no hypermetabolic foci within the marrow space or nodal stations to suggest active multiple myeloma. There was hypermetabolism  within the right lower lobe, corresponding to a similar dependent density. Morphology favored scarring.There was cirrhosis and portal hypertension. There was gastric body hypermetabolism favoring physiologic. Spleen was 15.3 cm (volume 1065 cc).  There was pulmonary artery enlargement suggesting pulmonary artery hypertension. 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  He has a history of asbestos exposure in the [employer].  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  Etiology is felt secondary to fatty liver.  Abdominal CT scan on 03/16/2015 revealed splenomegaly (14.5 cm), cirrhosis, and stigmata of portal hypertension with esophageal and gastric varices.  There were no liver lesions.  He is on aldactone, lactulose, and Xifaxan.  Abdominal ultrasound on 10/10/2016 demonstrated nodular hepatic contour, suggesting cirrhosis.  There were no focal hepatic lesion is seen. Portal vein was patent on color Doppler imaging with normal direction of blood flow towards the liver. He has splenomegaly (16.8 x 15.3 x 8.3 cm).  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  There were no pleural effusions.  Posterior lower lobe predominant interstitial lung disease was characterized by subpleural lines, subpleural reticulation and mild traction bronchiectasis, slowly  progressive back to 2008, most consistent with asbestosis.  There was no frank honeycombing. This finding accounted for the medial right lower lobe hypermetabolism on the 12/07/2014 PET-CT.  There was cirrhosis, splenomegaly, and prominent gastroesophageal varices.  He has had mild anemia since 10/2013 and mild thrombocytopenia since 05/2014. Thrombocytopenia is felt secondary to underlying liver disease and subsequent splenomegaly.  Colonoscopy in 10/2013 revealed polyps.  Colonoscopy on 03/04/2015 revealed a sessile polyp was seen in the transverse colon  (tubular adenoma).  Colonoscopy on 12/09/2015 revealed one 4 mm polyp in the ascending colon and two 4-5 mm polyps in the sigmoid colon (hyperplastic polyps).    EGD in 10/2013 revealed gastritis. EGD on 03/04/2015 revealed medium-sized esophageal varices in the mid and distal esophagus without bleeding. There was a sessile polyp in the gastric antrum (hyperplastic polyp).  There were 3 small angiodysplastic lesions in the second part of the duodenum.  EGD on 12/09/2015 revealed grade II esophageal varices.  There was a single gastric polyp.  There was a single bleeding angioectasia in the duodenum treated with bipolar cautery and clips.  EGD on 02/24/2016 revealed no gross lesions in the esophagus. There was a single gastric polyp and a duodenal polyp. Resection was attempted. No specimens were collected  Capsule enteroscopy on 11/27/2014 revealed 1-2 gastric polyps and one colon polyp.  There were no findings to explain iron deficiency anemia.  His diet was good. Labs confirmed iron deficiency (ferritin 11.8 on 05/2014 and 13 on 10/09/2014). B12 was low normal (MMA normal) thus ruling out B12 deficiency.  He has chronic diarrhea (2 years).  Diarrhea improved on Enterogam (began 06/22/2015).  He has intermittent diarrhea.    He has recurrent iron deficiency anemia.  He has received Venofer weekly 3 (03/26/2015 - 04/14/2015), weekly x 3 (06/25/2015 - 07/09/2015), weekly x 3 (09/17/2015 - 10/01/2015), weekly x 3 (11/16/2015 - 11/30/2015), weekly x 2 (01/18/2016 - 01/25/2016),  weekly x 3 (02/18/2016 - 03/03/2016), 200 mg (05/04/2016), and 600 mg (07/04/2016 - 07/18/2016).    Ferritin has been followed:  23 on 06/18/2015, 121 on 07/26/2015, 17 on 09/13/2015, 18 on 11/15/2015, 52 on 12/13/2015, 10 on 01/11/2016, 88 on 02/03/2016, 23 on 02/17/2016, 46 on 03/27/2016, 20 on 04/24/2016, 31 on 06/01/2016, 16 on 07/03/2016, 119 on 08/03/2016, 55 on 08/30/2016, 53 on 10/04/2016, 35 on 11/06/2016, and 54 on  12/06/2016.  Symptomatically,  patient is feeling well today. He denies any episodes of altered mental status.  Patient has no physical complaints of anything today. Hemoglobin is 11.6, hematocrit 34.0, and platelets 85,000.  Renal function has improved; BUN 19 with a creatinine of 1.33. Albumin 3.0.  Ferritin is 54. Patient's blood glucose is elevated.  Plan: 1.  Review labs from 12/06/2016.  CBC is stable to improved.  Ferritin is adequate. 2.  Discuss blood sugar control. Patient has had glucose readings of > 300.  Patient's wife felt that the insulin increased his blood sugar.  Wife encouraged to discuss diabetes concerns with patient's PCP.  3.  Discuss influenza vaccination. WBC is 5700 with an [place] of 3300. Ok to proceed with influenza vaccination today.  4.  RTC in 6 weeks for labs (CBC with diff, CMP, SPEP, AFP, ferritin), +/- Venofer 5.  RTC in 3 months for MD assessment, labs (CBC with diff, CMP, SPEP, ferritin-day before), and +/- Venofer.   Honor Loh, NP  12/07/2016, 3:11 PM   I saw and evaluated the patient, participating in the key portions of the service and  reviewing pertinent diagnostic studies and records.  I reviewed the nurse practitioner's note and agree with the findings and the plan.  The assessment and plan were discussed with the patient.  Several questions were asked by the patient's wife and answered.   Lequita Asal, MD  12/07/2016,3:11 PM

## 2016-12-07 ENCOUNTER — Encounter: Payer: Self-pay | Admitting: Hematology and Oncology

## 2016-12-07 ENCOUNTER — Inpatient Hospital Stay: Payer: PPO

## 2016-12-07 ENCOUNTER — Inpatient Hospital Stay (HOSPITAL_BASED_OUTPATIENT_CLINIC_OR_DEPARTMENT_OTHER): Payer: PPO | Admitting: Hematology and Oncology

## 2016-12-07 VITALS — BP 159/70 | HR 69 | Temp 98.0°F | Resp 16 | Wt 169.5 lb

## 2016-12-07 DIAGNOSIS — Z79899 Other long term (current) drug therapy: Secondary | ICD-10-CM | POA: Diagnosis not present

## 2016-12-07 DIAGNOSIS — C9000 Multiple myeloma not having achieved remission: Secondary | ICD-10-CM

## 2016-12-07 DIAGNOSIS — I8510 Secondary esophageal varices without bleeding: Secondary | ICD-10-CM

## 2016-12-07 DIAGNOSIS — Z23 Encounter for immunization: Secondary | ICD-10-CM

## 2016-12-07 DIAGNOSIS — K766 Portal hypertension: Secondary | ICD-10-CM | POA: Diagnosis not present

## 2016-12-07 DIAGNOSIS — K7469 Other cirrhosis of liver: Secondary | ICD-10-CM

## 2016-12-07 DIAGNOSIS — R161 Splenomegaly, not elsewhere classified: Secondary | ICD-10-CM | POA: Diagnosis not present

## 2016-12-07 DIAGNOSIS — D509 Iron deficiency anemia, unspecified: Secondary | ICD-10-CM

## 2016-12-07 DIAGNOSIS — D500 Iron deficiency anemia secondary to blood loss (chronic): Secondary | ICD-10-CM

## 2016-12-07 MED ORDER — INFLUENZA VAC SPLIT QUAD 0.5 ML IM SUSY
0.5000 mL | PREFILLED_SYRINGE | Freq: Once | INTRAMUSCULAR | Status: AC
  Administered 2016-12-07: 0.5 mL via INTRAMUSCULAR

## 2016-12-07 NOTE — Progress Notes (Signed)
Patient is here for follow up and iv venofer. He denies having any pain. He would like to get the Flu Vaccine today.

## 2016-12-14 ENCOUNTER — Telehealth: Payer: Self-pay | Admitting: Internal Medicine

## 2016-12-14 ENCOUNTER — Encounter: Payer: Self-pay | Admitting: Internal Medicine

## 2016-12-14 DIAGNOSIS — E119 Type 2 diabetes mellitus without complications: Secondary | ICD-10-CM

## 2016-12-14 NOTE — Telephone Encounter (Signed)
Please see my chart message that was sent to you

## 2016-12-14 NOTE — Telephone Encounter (Signed)
Pt spouse called and stated pt starts cramping after taking the insulin aspart (NOVOLOG FLEXPEN) 100 UNIT/ML FlexPen. Pt's neck,legs, and hands and has been happening for a while now. Pt took medication earlier and has been cramping for about 2 hours. Spouse states that his blood sugar spike around 12 to 1:30 and than 4-5 it drops. Please advise, thank you!  Call pt @ [phone]

## 2016-12-14 NOTE — Telephone Encounter (Signed)
Patient would like the referral to diabetic specialist

## 2016-12-14 NOTE — Telephone Encounter (Signed)
I am not sure if the insulin is what is causing the cramps.  I will look into this.  I would really like to get him set up wit ha diabetic specialist.  Given his liver issues, we are limited with the medications that can be used.  I would like for them to evaluate and see if there is something more we could be doing.  Also, may need a f/u appt to see if anything else could be causing the cramps.

## 2016-12-15 NOTE — Telephone Encounter (Signed)
Order placed for endocrinology referral.  Someone will be contacting them with an appointment date and time.

## 2016-12-15 NOTE — Telephone Encounter (Signed)
See other mychart message.

## 2016-12-18 ENCOUNTER — Telehealth: Payer: Self-pay | Admitting: Gastroenterology

## 2016-12-18 ENCOUNTER — Encounter: Payer: Self-pay | Admitting: Internal Medicine

## 2016-12-18 NOTE — Telephone Encounter (Signed)
Patient left a voice message for you to please call her.

## 2016-12-19 NOTE — Telephone Encounter (Signed)
Called patient reviewed all information with wife and sent in mychart she will call I she feels he needs to be seen.

## 2016-12-19 NOTE — Telephone Encounter (Signed)
Reviewed notes.  If increased congestion and some cough, can try saline nasal spray - flush nose at lease 2-3x/day.  nasacort nasal spray - 2 sprays each nostril one time per day.  Do this in the evening.  Also, robitussin DM twice a day as needed to help with cough and congestion.  I would try this first.  If symptoms progress, will need to be seen.  If feel needs to be evaluated now, I can work him in for this during lunch on Thursday 12/21/16.  I am not in the office 12/20/16.  I will also check on endocrinology referral.

## 2016-12-19 NOTE — Telephone Encounter (Signed)
Pt's wife, Deloris wants to know if you know of any good Endocrinologist in the area that you would recommend. He is being referred and they value your opinion on these doctors.

## 2016-12-20 NOTE — Telephone Encounter (Signed)
Patient has been scheduled for 12/21/2016 at 12:30. Told patients wife to be here at 12:15.

## 2016-12-20 NOTE — Telephone Encounter (Signed)
We discussed this already

## 2016-12-21 ENCOUNTER — Ambulatory Visit (INDEPENDENT_AMBULATORY_CARE_PROVIDER_SITE_OTHER): Payer: PPO

## 2016-12-21 ENCOUNTER — Ambulatory Visit (INDEPENDENT_AMBULATORY_CARE_PROVIDER_SITE_OTHER): Payer: PPO | Admitting: Internal Medicine

## 2016-12-21 ENCOUNTER — Encounter: Payer: Self-pay | Admitting: Internal Medicine

## 2016-12-21 VITALS — BP 140/80 | HR 92 | Temp 99.0°F | Resp 18 | Wt 165.0 lb

## 2016-12-21 DIAGNOSIS — E119 Type 2 diabetes mellitus without complications: Secondary | ICD-10-CM

## 2016-12-21 DIAGNOSIS — R05 Cough: Secondary | ICD-10-CM

## 2016-12-21 DIAGNOSIS — R0989 Other specified symptoms and signs involving the circulatory and respiratory systems: Secondary | ICD-10-CM | POA: Diagnosis not present

## 2016-12-21 DIAGNOSIS — R059 Cough, unspecified: Secondary | ICD-10-CM

## 2016-12-21 DIAGNOSIS — J069 Acute upper respiratory infection, unspecified: Secondary | ICD-10-CM | POA: Diagnosis not present

## 2016-12-21 MED ORDER — ALBUTEROL SULFATE (2.5 MG/3ML) 0.083% IN NEBU
2.5000 mg | INHALATION_SOLUTION | Freq: Once | RESPIRATORY_TRACT | Status: AC
  Administered 2016-12-21: 2.5 mg via RESPIRATORY_TRACT

## 2016-12-21 MED ORDER — ALBUTEROL SULFATE HFA 108 (90 BASE) MCG/ACT IN AERS: 2.0000 | INHALATION_SPRAY | Freq: Four times a day (QID) | RESPIRATORY_TRACT | 1 refills | Status: DC | PRN

## 2016-12-21 MED ORDER — AMOXICILLIN-POT CLAVULANATE 875-125 MG PO TABS: 1.0000 | ORAL_TABLET | Freq: Two times a day (BID) | ORAL | 0 refills | Status: DC

## 2016-12-21 NOTE — Progress Notes (Signed)
Pre visit review using our clinic review tool, if applicable. No additional management support is needed unless otherwise documented below in the visit note. 

## 2016-12-21 NOTE — Progress Notes (Signed)
Patient ID: Zachary Buck, male   DOB: Aug 05, 1940, 76 y.o.   MRN: [id number]   Subjective:    Patient ID: Zachary Buck, male    DOB: 1940/09/18, 76 y.o.   MRN: [id number]  HPI  Patient here as a work in with concerns regarding increased cough and congestion.  States symptoms started five days ago.  Increased chest congestion and cough.  No sinus pressure.  No documented fever.  Increased chest congestion.  No vomiting.  Eating.  Sugars increased at lunch time.  Discussed insulin.  Off metformin secondary to his liver issues.     Past Medical History:  Diagnosis Date  . Anemia   . Atherosclerotic heart disease    s/p MI, s/p stent mid circumflex  . CAD (coronary artery disease)    Dr Nehemiah Massed, cardiologist  . Cirrhosis of liver ([place])   . Clavicle fracture   . Diabetes mellitus without complication ([place])    oral med  . Elevated blood sugar    02/03/16 and 02/09/16 had BS over 400.  otherwise running around 250  . Esophageal varices ([place])   . Hepatic encephalopathy ([place])   . Hypercholesterolemia   . Hypertension    controlled on meds  . Internal hemorrhoids   . Leg fracture   . Multiple myeloma ([place])   . Myocardial infarct ([place])   . Prostate enlargement   . Tubular adenoma of colon    Past Surgical History:  Procedure Laterality Date  . CORONARY ANGIOPLASTY WITH STENT PLACEMENT     2 stents  . TONSILLECTOMY     Family History  Problem Relation Age of Onset  . Aneurysm Father 51       of the heart  . Heart attack Father   . Diabetes Mother    Social History   Socioeconomic History  . Marital status: Married    Spouse name: None  . Number of children: 2  . Years of education: None  . Highest education level: None  Social Needs  . Financial resource strain: None  . Food insecurity - worry: None  . Food insecurity - inability: None  . Transportation needs - medical: None  . Transportation needs - non-medical: None  Occupational History  .  Occupation: Engineer, building services: [employer]  Tobacco Use  . Smoking status: Never Smoker  . Smokeless tobacco: Never Used  Substance and Sexual Activity  . Alcohol use: No    Alcohol/week: 0.0 oz  . Drug use: No  . Sexual activity: None  Other Topics Concern  . None  Social History Narrative  . None    Outpatient Encounter Medications as of 12/21/2016  Medication Sig  . Alpha-Lipoic Acid (LIPOIC ACID PO) Take 200 mg by mouth daily.   . Ascorbic Acid (VITAMIN C) 1000 MG tablet Take 1,000 mg by mouth daily.  . B Complex Vitamins (VITAMIN B COMPLEX) TABS Take by mouth daily.  Jolyne Loa Grape-Goldenseal (BERBERINE COMPLEX PO) Take 2,000 mg by mouth 2 (two) times daily.  . blood glucose meter kit and supplies KIT Dispense based on patient and insurance preference. Use up to four times daily as directed. E11.9 Please dispense Freestyle freedom meter and supplies.  . Cholecalciferol (VITAMIN D-3) 1000 UNITS CAPS Take by mouth.  . Cinnamon 500 MG capsule Take 500 mg by mouth.  . Coenzyme Q10 100 MG capsule Take 100 mg by mouth daily.   . furosemide (LASIX) 20 MG tablet Take 1 tablet (20 mg total)  by mouth 2 (two) times daily.  Marland Kitchen glipiZIDE (GLUCOTROL) 5 MG tablet Take 1 tablet (5 mg total) by mouth See admin instructions.  Marland Kitchen glucose blood (ONE TOUCH ULTRA TEST) test strip Use as instructed  . glucose blood test strip Freestyle lite test strips, check blood sugar three times daily: Dx 250.00  . insulin aspart (NOVOLOG FLEXPEN) 100 UNIT/ML FlexPen Inject 0-9 Units into the skin 3 (three) times daily with meals.  . insulin glargine (LANTUS) 100 unit/mL SOPN Inject 0.1 mLs (10 Units total) into the skin at bedtime.  . Insulin Pen Needle (PEN NEEDLES) 31G X 6 MM MISC 1 application by Does not apply route 4 (four) times daily.  Marland Kitchen lactulose (CHRONULAC) 10 GM/15ML solution Take 30 mLs (20 g total) by mouth 4 (four) times daily.  . Lancets (ONETOUCH ULTRASOFT) lancets Use as instructed  .  lisinopril (PRINIVIL,ZESTRIL) 20 MG tablet TAKE ONE TABLET BY MOUTH EVERY DAY/ AM  . metFORMIN (GLUCOPHAGE) 1000 MG tablet TAKE 1 TABLET BY MOUTH TWICE A DAY WITH A MEAL  . MILK THISTLE PO Take 25 mg by mouth daily.  . rifaximin (XIFAXAN) 550 MG TABS tablet Take 1 tablet (550 mg total) by mouth 2 (two) times daily.  . sildenafil (REVATIO) 20 MG tablet 3 to 5 tablets per day or as instructed  . spironolactone (ALDACTONE) 50 MG tablet Take 1 tablet (50 mg total) by mouth daily.  . Syringe/Needle, Disp, 30G X 1/2" 1 ML MISC 1 Syringe by Does not apply route as directed.  . Theanine 100 MG CAPS Take by mouth daily.  Marland Kitchen triamcinolone cream (KENALOG) 0.1 % Apply topically 2 (two) times daily. Please avoid face and genitalia area.  Do not use in the same spot for more than 7-10 days in a row. Pharmacy changed to [facility].  . Turmeric POWD 4,000 mg by Does not apply route daily.  . vitamin B-12 (CYANOCOBALAMIN) 100 MCG tablet Take 200 mcg by mouth daily.  Marland Kitchen zinc sulfate 220 (50 Zn) MG capsule Take 1 capsule (220 mg total) by mouth 2 (two) times daily.  Marland Kitchen albuterol (PROVENTIL HFA;VENTOLIN HFA) 108 (90 Base) MCG/ACT inhaler Inhale 2 puffs into the lungs every 6 (six) hours as needed for wheezing or shortness of breath.  Marland Kitchen amoxicillin-clavulanate (AUGMENTIN) 875-125 MG tablet Take 1 tablet by mouth 2 (two) times daily.  . Omega-3 Fatty Acids (FISH OIL) 1000 MG CAPS Take 4,000 mg by mouth daily.    Facility-Administered Encounter Medications as of 12/21/2016  Medication  . [COMPLETED] albuterol (PROVENTIL) (2.5 MG/3ML) 0.083% nebulizer solution 2.5 mg  . alteplase (CATHFLO ACTIVASE) injection 2 mg  . heparin lock flush 100 unit/mL  . heparin lock flush 100 unit/mL  . sodium chloride 0.9 % injection 10 mL  . sodium chloride 0.9 % injection 3 mL    Review of Systems  Constitutional: Negative for appetite change.  HENT: Positive for congestion. Negative for sinus pressure.   Respiratory: Positive for  cough. Negative for chest tightness and shortness of breath.   Cardiovascular: Negative for chest pain and palpitations.  Gastrointestinal: Negative for nausea and vomiting.  Genitourinary: Negative for difficulty urinating and dysuria.  Musculoskeletal: Negative for gait problem and joint swelling.  Neurological: Negative for dizziness, light-headedness and headaches.  Psychiatric/Behavioral: Negative for agitation and dysphoric mood.       Objective:    Physical Exam  Constitutional: He appears well-developed and well-nourished. No distress.  HENT:  Mouth/Throat: Oropharynx is clear and moist.  Nares:  Some minimal congestion.    Neck: Neck supple.  Cardiovascular: Normal rate and regular rhythm.  Pulmonary/Chest: Effort normal. No respiratory distress.  Increased congestion on exam.  Increased cough with expiration.    Abdominal: Bowel sounds are normal.  Musculoskeletal: He exhibits no edema or tenderness.  Lymphadenopathy:    He has no cervical adenopathy.  Skin: No rash noted. No erythema.  Psychiatric: He has a normal mood and affect. His behavior is normal.    BP 140/80 (BP Location: Left Arm, Patient Position: Sitting, Cuff Size: Normal)   Pulse 92   Temp 99 F (37.2 C) (Oral)   Resp 18   Wt 165 lb (74.8 kg)   SpO2 92%   BMI 24.37 kg/m  Wt Readings from Last 3 Encounters:  12/21/16 165 lb (74.8 kg)  12/07/16 169 lb 8 oz (76.9 kg)  10/12/16 168 lb (76.2 kg)     Lab Results  Component Value Date   WBC 5.7 12/06/2016   HGB 11.6 (L) 12/06/2016   HCT 34.0 (L) 12/06/2016   PLT 85 (L) 12/06/2016   GLUCOSE 243 (H) 12/06/2016   CHOL 149 04/25/2016   TRIG 273 (H) 04/25/2016   HDL 23 (L) 04/25/2016   LDLDIRECT 56.0 12/17/2015   LDLCALC 71 04/25/2016   ALT 27 12/06/2016   AST 29 12/06/2016   NA 135 12/06/2016   K 4.5 12/06/2016   CL 109 12/06/2016   CREATININE 1.33 (H) 12/06/2016   BUN 19 12/06/2016   CO2 22 12/06/2016   TSH 6.49 (H) 12/17/2015   INR  1.12 10/01/2016   HGBA1C 8.7 (H) 06/01/2016   MICROALBUR 22.4 (H) 04/29/2015    US Abdomen Complete  Result Date: 10/10/2016 CLINICAL DATA:  Cirrhosis, portal hypertension, stage II multiple myeloma EXAM: ABDOMEN ULTRASOUND COMPLETE COMPARISON:  CT abdomen dated 03/16/2015 FINDINGS: Gallbladder: No gallstones or wall thickening visualized. No sonographic Murphy sign noted by sonographer. Common bile duct: Diameter: 5 mm Liver: Nodular hepatic contour. No focal hepatic lesion is seen. Portal vein is patent on color Doppler imaging with normal direction of blood flow towards the liver. IVC: No abnormality visualized. Pancreas: Visualized portion unremarkable. Spleen: Enlarged, measuring 16.8 x 15.3 x 8.3 cm (calculated volume 1113 mL). Right Kidney: Length: 10.4 cm. Echogenicity within normal limits. No mass or hydronephrosis visualized. Left Kidney: Length: 11.0 cm. Echogenicity within normal limits. No mass or hydronephrosis visualized. Abdominal aorta: No aneurysm visualized. Other findings: Mild abdominal ascites. IMPRESSION: Nodular hepatic contour, suggesting cirrhosis. Patent portal vein with normal direction of flow. Splenomegaly. Electronically Signed   By: Julian Hy M.D.   On: 10/10/2016 10:06       Assessment & Plan:   Problem List Items Addressed This Visit    Diabetes ([place])    Sugars elevated.  Previously under better control on oral medications.  Metformin stopped given liver issues and multiple hospitalizations.  On insulin.  He is concerned that insulin is making him feel worse.  Trying to adjust insulin dosing.  Schedule with endocrinology for further evaluation and treatment recommendations.        URI (upper respiratory infection)    Persistent increased cough and congestion.  Using nasacort and will continue.  Given albuterol neb here in the office.  Tolerated well.  Increased air movement after treatment.  Pulse ox increased to 95-97% post neb.  Check cxr.  Treat with  augmentin as directed.  Robitussin DM as directed.  Albuterol inhaler.  Hold on prednisone.  Follow closely.  Other Visit Diagnoses    Cough    -  Primary   Relevant Medications   albuterol (PROVENTIL) (2.5 MG/3ML) 0.083% nebulizer solution 2.5 mg (Completed)   Other Relevant Orders   DG Chest 2 View (Completed)       Einar Pheasant, MD

## 2016-12-21 NOTE — Patient Instructions (Signed)
Continue nasacort nasal sprya  Rotitussin DM twice a day as needed  Take the antibiotic as directed.    Start a probiotic daily while on the antibiotic and for two weeks after completing the antibiotic.    Albuterol inhaler as directed.

## 2016-12-24 ENCOUNTER — Encounter: Payer: Self-pay | Admitting: Internal Medicine

## 2016-12-24 DIAGNOSIS — J069 Acute upper respiratory infection, unspecified: Secondary | ICD-10-CM | POA: Insufficient documentation

## 2016-12-24 NOTE — Assessment & Plan Note (Signed)
Sugars elevated.  Previously under better control on oral medications.  Metformin stopped given liver issues and multiple hospitalizations.  On insulin.  He is concerned that insulin is making him feel worse.  Trying to adjust insulin dosing.  Schedule with endocrinology for further evaluation and treatment recommendations.

## 2016-12-24 NOTE — Assessment & Plan Note (Signed)
Persistent increased cough and congestion.  Using nasacort and will continue.  Given albuterol neb here in the office.  Tolerated well.  Increased air movement after treatment.  Pulse ox increased to 95-97% post neb.  Check cxr.  Treat with augmentin as directed.  Robitussin DM as directed.  Albuterol inhaler.  Hold on prednisone.  Follow closely.

## 2016-12-26 DIAGNOSIS — H2513 Age-related nuclear cataract, bilateral: Secondary | ICD-10-CM | POA: Diagnosis not present

## 2016-12-26 DIAGNOSIS — H43393 Other vitreous opacities, bilateral: Secondary | ICD-10-CM | POA: Diagnosis not present

## 2016-12-26 DIAGNOSIS — H5203 Hypermetropia, bilateral: Secondary | ICD-10-CM | POA: Diagnosis not present

## 2016-12-26 DIAGNOSIS — H5213 Myopia, bilateral: Secondary | ICD-10-CM | POA: Diagnosis not present

## 2016-12-27 ENCOUNTER — Encounter: Payer: Self-pay | Admitting: Internal Medicine

## 2016-12-28 ENCOUNTER — Ambulatory Visit: Payer: Self-pay | Admitting: *Deleted

## 2016-12-28 MED ORDER — BENZONATATE 100 MG PO CAPS: 100.0000 mg | ORAL_CAPSULE | Freq: Three times a day (TID) | ORAL | 0 refills | Status: DC

## 2016-12-28 NOTE — Telephone Encounter (Signed)
Patient still having a dry cough and he is having diarrhea due to the amoxicillin and taking lactulose for his liver but the cough is patient main complaint his wife wants to know if there is anything he can take for the cough.

## 2016-12-28 NOTE — Addendum Note (Signed)
Addended by: Nanci Pina on: 12/28/2016 02:53 PM   Modules accepted: Orders

## 2016-12-28 NOTE — Telephone Encounter (Signed)
See his my chart message.  He had reported that he was feeling better, so I had sent him a message to continue with same medication.  If having persistent increased issues, make sure he is using the inhaler.  Also can call in tessalon perles 100mg  tid prn cough #21 with no refills.  Also, make sure taking a probiotic with the abx.  If diarrhea persist and is worsening, will need to be evaluated.

## 2016-12-28 NOTE — Telephone Encounter (Signed)
Pt wants  To  Speak  To  Dr  Nicki Reaper  She  States  She  Has   Sent  Her  E mails    And  Wants  To  Know   What  She  Can take  For  The   Cough     Reason for Disposition . Cough  Answer Assessment - Initial Assessment Questions 1. ONSET: "When did the cough begin?"       10  Days  Ago   2. SEVERITY: "How bad is the cough today?"  mod    3. RESPIRATORY DISTRESS: "Describe your breathing."    Breathing  Is  Fine    4. FEVER: "Do you have a fever?" If so, ask: "What is your temperature, how was it measured, and when did it start?"     No    5. HEMOPTYSIS: "Are you coughing up any blood?" If so ask: "How much?" (flecks, streaks, tablespoons, etc.)     no6. TREATMENT: "What have you done so far to treat the cough?" (e.g., meds, fluids, humidifier)     robitusin  7. CARDIAC HISTORY: "Do you have any history of heart disease?" (e.g., heart attack, congestive heart failure)       Heart   Attack     8. LUNG HISTORY: "Do you have any history of lung disease?"  (e.g., pulmonary embolus, asthma, emphysema)     no 9. PE RISK FACTORS: "Do you have a history of blood clots?" (or: recent major surgery, recent prolonged travel, bedridden )     no 10. OTHER SYMPTOMS: "Do you have any other symptoms? (e.g., runny nose, wheezing, chest pain)   11. PREGNANCY: "Is there any chance you are pregnant?" "When was your last menstrual period?"       *No Answer* 12. TRAVEL: "Have you traveled out of the country in the last month?" (e.g., travel history, exposures)       *No Answer*  Protocols used: COUGH - ACUTE NON-PRODUCTIVE-A-AH

## 2016-12-28 NOTE — Telephone Encounter (Signed)
Called talked with patient wife called in cough capsules and in prior conversation had confirmed using inhaler and taking probiotic.  Patient will be evaluated if diarrhea persist or becomes more frequent.

## 2017-01-07 NOTE — Progress Notes (Signed)
Cardiology Office Note  Date:  01/09/2017   ID:  Zachary Buck, DOB 12-29-1940, MRN [id number]  PCP:  Zachary Pheasant, MD   Chief Complaint  Patient presents with  . other    Former Zachary Buck pt est.care. Meds reviewed verbally with pt.    HPI:  Zachary Buck is a 76 year old gentleman with past medical history of CAD Stent to mid LCX 2007 Significant coronary calcification in the LAD and left circumflex on CT scan 2017 long history of diabetes DM  HGBA1C 6.8 HTN Hyperlipidemia Cirrhoisis, normal LFTs, secondary to Zachary Buck per the patient  Total chol 149, LDL 71 nonsmoker Multiple myeloma, followed by heme/onc Stress echo 08/2014, normal study Prior history of morbid obesity Who presents by referral from Dr. Nicki Buck to establish care in the [facility] office for follow-up of his coronary disease, prior stent to the left circumflex, risk factors  Wife presents with him today, she manages his medications He is on numerous over-the-counter vitamins and supplements He is not on a statin, beta-blocker, Does tolerate ACE inhibitor  He reports that he feels well, denies any chest pain or shortness of breath Previous anginal symptoms 10 years ago prior to stent placement included some chest tightness Last stress test approximately 2 years ago, stress echo Results reviewed personally by myself showing normal LV function with no ischemia, done at outside office  Active at baseline with no regular exercise program Wife is helping him with his diet, reports he has been slowly losing weight  EKG personally reviewed by myself on todays visit Shows normal sinus rhythm rate 61 bpm no significant ST or T wave changes   PMH:   has a past medical history of Anemia, Atherosclerotic heart disease, CAD (coronary artery disease), Cirrhosis of liver ([place]), Clavicle fracture, Diabetes mellitus without complication ([place]), Elevated blood sugar, Esophageal varices ([place]), Hepatic  encephalopathy ([place]), Hypercholesterolemia, Hypertension, Internal hemorrhoids, Leg fracture, Multiple myeloma ([place]), Myocardial infarct ([place]), Prostate enlargement, and Tubular adenoma of colon.  PSH:    Past Surgical History:  Procedure Laterality Date  . COLONOSCOPY WITH PROPOFOL N/A 12/09/2015   Performed by Zachary Lame, MD at [place]  . COLONOSCOPY WITH PROPOFOL N/A 03/04/2015   Performed by Zachary Bears, MD at [place]  . CORONARY ANGIOPLASTY WITH STENT PLACEMENT     2 stents  . ESOPHAGOGASTRODUODENOSCOPY (EGD) WITH PROPOFOL N/A 02/24/2016   Performed by Zachary Lame, MD at [place]  . ESOPHAGOGASTRODUODENOSCOPY (EGD) WITH PROPOFOL N/A 12/21/2015   Performed by Zachary Lame, MD at [place]  . ESOPHAGOGASTRODUODENOSCOPY (EGD) WITH PROPOFOL N/A 12/09/2015   Performed by Zachary Lame, MD at [place]  . ESOPHAGOGASTRODUODENOSCOPY (EGD) WITH PROPOFOL N/A 03/04/2015   Performed by Zachary Bears, MD at [place]  . POLYPECTOMY N/A 12/09/2015   Performed by Zachary Lame, MD at [place]  . TONSILLECTOMY      Current Outpatient Medications  Medication Sig Dispense Refill  . Alpha-Lipoic Acid (LIPOIC ACID PO) Take 200 mg by mouth daily.     . Ascorbic Acid (VITAMIN C) 1000 MG tablet Take 1,000 mg by mouth daily.    . B Complex Vitamins (VITAMIN B COMPLEX) TABS Take by mouth daily.    . blood glucose meter kit and supplies KIT Dispense based on patient and insurance preference. Use up to four times daily as directed. E11.9 Please dispense Freestyle freedom meter and supplies. 1 each 0  . Cinnamon 500 MG capsule Take 500 mg by  mouth.    . Coenzyme Q10 100 MG capsule Take 100 mg by mouth daily.     . furosemide (LASIX) 20 MG tablet Take 1 tablet (20 mg total) by mouth 2 (two) times daily. 60 tablet 3  . glipiZIDE (GLUCOTROL) 5 MG tablet Take 1 tablet (5 mg total) by mouth See admin instructions. 270 tablet 1  . glucose blood (ONE TOUCH  ULTRA TEST) test strip Use as instructed 100 each 12  . glucose blood test strip Freestyle lite test strips, check blood sugar three times daily: Dx 250.00 100 each 3  . insulin aspart (NOVOLOG FLEXPEN) 100 UNIT/ML FlexPen Inject 0-9 Units into the skin 3 (three) times daily with meals. 15 mL 11  . insulin glargine (LANTUS) 100 unit/mL SOPN Inject 0.1 mLs (10 Units total) into the skin at bedtime. 15 mL 11  . Insulin Pen Needle (PEN NEEDLES) 31G X 6 MM MISC 1 application by Does not apply route 4 (four) times daily. 200 each 4  . lactulose (CHRONULAC) 10 GM/15ML solution Take 30 mLs (20 g total) by mouth 4 (four) times daily. 946 mL 6  . Lancets (ONETOUCH ULTRASOFT) lancets Use as instructed 100 each 12  . lisinopril (PRINIVIL,ZESTRIL) 20 MG tablet TAKE ONE TABLET BY MOUTH EVERY DAY/ AM    . MILK THISTLE PO Take 25 mg by mouth daily.    . Omega-3 Fatty Acids (FISH OIL) 1000 MG CAPS Take 4,000 mg by mouth daily.     . rifaximin (XIFAXAN) 550 MG TABS tablet Take 1 tablet (550 mg total) by mouth 2 (two) times daily. 60 tablet 11  . sildenafil (REVATIO) 20 MG tablet 3 to 5 tablets per day or as instructed    . Syringe/Needle, Disp, 30G X 1/2" 1 ML MISC 1 Syringe by Does not apply route as directed. 150 each 6  . Theanine 100 MG CAPS Take by mouth daily.    Marland Kitchen triamcinolone cream (KENALOG) 0.1 % Apply topically 2 (two) times daily. Please avoid face and genitalia area.  Do not use in the same spot for more than 7-10 days in a row. Pharmacy changed to [facility]. 80 g 0  . Turmeric POWD 4,000 mg by Does not apply route daily.    Marland Kitchen zinc sulfate 220 (50 Zn) MG capsule Take 1 capsule (220 mg total) by mouth 2 (two) times daily.     No current facility-administered medications for this visit.    Facility-Administered Medications Ordered in Other Visits  Medication Dose Route Frequency Provider Last Rate Last Dose  . alteplase (CATHFLO ACTIVASE) injection 2 mg  2 mg Intracatheter Once PRN Nolon Stalls C, MD      . heparin lock flush 100 unit/mL  500 Units Intracatheter Once PRN Mike Gip, Melissa C, MD      . heparin lock flush 100 unit/mL  250 Units Intracatheter Once PRN Mike Gip, Melissa C, MD      . sodium chloride 0.9 % injection 10 mL  10 mL Intracatheter PRN Corcoran, Melissa C, MD      . sodium chloride 0.9 % injection 3 mL  3 mL Intravenous Once PRN Mike Gip, Drue Second, MD         Allergies:   Niaspan [niacin er]   Social History:  The patient  reports that  has never smoked. he has never used smokeless tobacco. He reports that he does not drink alcohol or use drugs.   Family History:   family history includes Aneurysm (age of  onset: 51) in his father; Diabetes in his mother; Heart attack in his father.    Review of Systems: Review of Systems  Constitutional: Negative.   Respiratory: Negative.   Cardiovascular: Negative.   Gastrointestinal: Negative.   Musculoskeletal: Negative.   Neurological: Negative.   Psychiatric/Behavioral: Negative.   All other systems reviewed and are negative.    PHYSICAL EXAM: VS:  BP 134/62 (BP Location: Right Arm, Patient Position: Sitting, Cuff Size: Normal)   Pulse 61   Ht 5' 9" (1.753 m)   Wt 167 lb (75.8 kg)   BMI 24.66 kg/m  , BMI Body mass index is 24.66 kg/m. GEN: Well nourished, well developed, in no acute distress  HEENT: normal  Neck: no JVD, carotid bruits, or masses Cardiac: RRR; no murmurs, rubs, or gallops,no edema  Respiratory:  clear to auscultation bilaterally, normal work of breathing GI: soft, nontender, nondistended, + BS MS: no deformity or atrophy  Skin: warm and dry, no rash Neuro:  Strength and sensation are intact Psych: euthymic mood, full affect    Recent Labs: 12/06/2016: ALT 27; BUN 19; Creatinine, Ser 1.33; Hemoglobin 11.6; Platelets 85; Potassium 4.5; Sodium 135    Lipid Panel Lab Results  Component Value Date   CHOL 149 04/25/2016   HDL 23 (L) 04/25/2016   LDLCALC 71 04/25/2016    TRIG 273 (H) 04/25/2016      Wt Readings from Last 3 Encounters:  01/09/17 167 lb (75.8 kg)  12/21/16 165 lb (74.8 kg)  12/07/16 169 lb 8 oz (76.9 kg)       ASSESSMENT AND PLAN:  Coronary artery disease of native artery of native heart with stable angina pectoris ([place]) - Plan: EKG 12-Lead Denies having anginal symptoms, we have ordered no further testing at this time, previous records have been reviewed Long discussion concerning anginal symptoms to watch for Which we did pull up his images from 2017 showing three-vessel coronary calcification But they do not want to change his medication regimen  Essential hypertension, benign Blood pressure is well controlled on today's visit. No changes made to the medications.  Hypercholesterolemia He does not want a statin.  Wife has indicated she does not want a statin for him Discussed the benefits of statin, plaque stabilization, CT scan images shown to him in detail showing significant coronary calcifications in LAD and we know he has disease in left circumflex as he had stent placement, also with RCA disease Recommended they try red yeast rice Wife is not particularly interested in Z[place]  Cirrhosis Patient reports this is secondary to [place] Several years ago was morbidly obese, has lost 60 pounds since that time  Type 2 diabetes mellitus with other circulatory complication, without long-term current use of insulin ([place])  improvement in his diabetes after dramatic weight loss per the wife Stressed importance of aggressive diabetes control, low hemoglobin A1c Managed by primary care   Disposition:   F/U  12 months   Total encounter time more than 60 minutes  Greater than 50% was spent in counseling and coordination of care with the patient    Orders Placed This Encounter  Procedures  . EKG 12-Lead     Signed, Esmond Plants, M.D., Ph.D. 01/09/2017  [place], [place]

## 2017-01-09 ENCOUNTER — Ambulatory Visit: Payer: PPO | Admitting: Cardiovascular Disease

## 2017-01-09 ENCOUNTER — Encounter: Payer: Self-pay | Admitting: Cardiovascular Disease

## 2017-01-09 VITALS — BP 134/62 | HR 61 | Ht 69.0 in | Wt 167.0 lb

## 2017-01-09 DIAGNOSIS — K766 Portal hypertension: Secondary | ICD-10-CM | POA: Diagnosis not present

## 2017-01-09 DIAGNOSIS — E7800 Pure hypercholesterolemia, unspecified: Secondary | ICD-10-CM

## 2017-01-09 DIAGNOSIS — I10 Essential (primary) hypertension: Secondary | ICD-10-CM

## 2017-01-09 DIAGNOSIS — I25118 Atherosclerotic heart disease of native coronary artery with other forms of angina pectoris: Secondary | ICD-10-CM

## 2017-01-09 DIAGNOSIS — E1159 Type 2 diabetes mellitus with other circulatory complications: Secondary | ICD-10-CM | POA: Diagnosis not present

## 2017-01-09 NOTE — Patient Instructions (Addendum)
Please    Medication Instructions:   No medication changes made  Labwork:  No new labs needed  Testing/Procedures:  No further testing at this time   Follow-Up: It was a pleasure seeing you in the office today. Please call us if you have new issues that need to be addressed before your next appt.  [phone]  Your physician wants you to follow-up in: 12 months.  You will receive a reminder letter in the mail two months in advance. If you don't receive a letter, please call our office to schedule the follow-up appointment.  If you need a refill on your cardiac medications before your next appointment, please call your pharmacy.

## 2017-01-15 IMAGING — CT NM PET TUM IMG INITIAL (PI) SKULL BASE T - THIGH
1 of 10 series · 1 of 25 positions shown · non-contrast
Comparison: Skeletal survey of 10/20/2014. Abdominal pelvic CT of
10/17/2013. Chest CT of 08/20/2013.

ADDENDUM:
The spleen measures 15.3 x 10.3 cm in greatest transverse dimension.
On the order of 13 cm craniocaudal. Splenic volume of 0068 cc.
CLINICAL DATA: Subsequent Treatment strategy for multiple myeloma.

EXAM:
NUCLEAR MEDICINE PET SKULL BASE TO THIGH
TECHNIQUE: 12.9 mCi F-18 FDG was injected intravenously. Full-ring PET imaging
was performed from the skull base to thigh after the radiotracer. CT
data was obtained and used for attenuation correction and anatomic
localization.
FASTING BLOOD GLUCOSE:  Value: 193 mg/dl

[Series 3: ct wb 5.0 b30f · axial · 5.0mm · 0.98mm/px · 1 of 290 slices shown]
[im 290/290  brain]
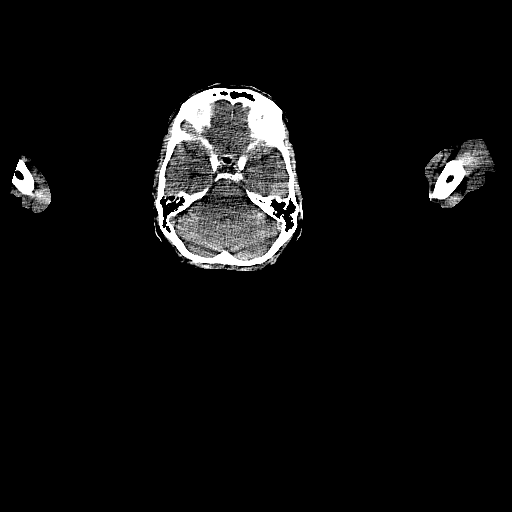

[1 of 25 positions shown; findings below may reference images not displayed]

FINDINGS: NECK

No areas of abnormal hypermetabolism.

CHEST

Dependent right lower lobe interstitial thickening and increased
density. Has a morphology and location which favor scarring. Example
on image/series [DATE]. This is similar in distribution to on the
10/14/2013 chest CT. This does correspond to patchy hypermetabolism,
including measuring a S.U.V. max of 3.4.

ABDOMEN/PELVIS

Mild hypermetabolism involving the proximal stomach. Underdistention
in this area. This measures a S.U.V. max of 5.9, including on
image/series [DATE].

SKELETON

No abnormal marrow activity.

CT IMAGES PERFORMED FOR ATTENUATION CORRECTION

Bilateral carotid atherosclerosis.  No cervical adenopathy.

Mild cardiomegaly. Multivessel coronary artery atherosclerosis.
Pulmonary artery enlargement, outflow tract 3.3 cm. A small hiatal
hernia. Bilateral pleural thickening and calcifications. Scattered
calcified granulomas

Periesophageal varices, with soft tissue fullness identified on
image/series [DATE]. Mild cirrhosis and splenomegaly. Abdominal
aortic and branch vessel atherosclerosis. Mild prostatomegaly.
Degenerative partial fusion of the bilateral sacroiliac joints. No
suspicious focal osseous lesion.
IMPRESSION: 1. No hypermetabolic foci within the marrow space or nodal stations
to suggest active multiple myeloma.
2. Hypermetabolism within the right lower lobe, corresponding to
similar dependent density. Morphology favors scarring. Given
hypermetabolism, consider follow-up with chest CT stability at 3-6
months to confer.
3. Cirrhosis and portal venous hypertension, incompletely
characterized. Consider dedicated pre and post contrast abdominal CT
or MRI.
4. Gastric body hypermetabolism which is favored to be physiologic.
5.  Atherosclerosis, including within the coronary arteries.
6. Pulmonary artery enlargement suggests pulmonary arterial
hypertension.
7. Asbestos related pleural disease.

## 2017-01-18 ENCOUNTER — Inpatient Hospital Stay: Payer: PPO

## 2017-01-18 ENCOUNTER — Inpatient Hospital Stay: Payer: PPO | Attending: Hematology and Oncology

## 2017-01-18 DIAGNOSIS — J61 Pneumoconiosis due to asbestos and other mineral fibers: Secondary | ICD-10-CM | POA: Diagnosis not present

## 2017-01-18 DIAGNOSIS — J189 Pneumonia, unspecified organism: Secondary | ICD-10-CM | POA: Diagnosis not present

## 2017-01-18 DIAGNOSIS — I2510 Atherosclerotic heart disease of native coronary artery without angina pectoris: Secondary | ICD-10-CM | POA: Insufficient documentation

## 2017-01-18 DIAGNOSIS — K2970 Gastritis, unspecified, without bleeding: Secondary | ICD-10-CM | POA: Diagnosis not present

## 2017-01-18 DIAGNOSIS — Z79899 Other long term (current) drug therapy: Secondary | ICD-10-CM | POA: Diagnosis not present

## 2017-01-18 DIAGNOSIS — K7469 Other cirrhosis of liver: Secondary | ICD-10-CM | POA: Insufficient documentation

## 2017-01-18 DIAGNOSIS — K529 Noninfective gastroenteritis and colitis, unspecified: Secondary | ICD-10-CM | POA: Diagnosis not present

## 2017-01-18 DIAGNOSIS — R161 Splenomegaly, not elsewhere classified: Secondary | ICD-10-CM | POA: Insufficient documentation

## 2017-01-18 DIAGNOSIS — K648 Other hemorrhoids: Secondary | ICD-10-CM | POA: Diagnosis not present

## 2017-01-18 DIAGNOSIS — I252 Old myocardial infarction: Secondary | ICD-10-CM | POA: Diagnosis not present

## 2017-01-18 DIAGNOSIS — I8510 Secondary esophageal varices without bleeding: Secondary | ICD-10-CM | POA: Diagnosis not present

## 2017-01-18 DIAGNOSIS — K760 Fatty (change of) liver, not elsewhere classified: Secondary | ICD-10-CM | POA: Insufficient documentation

## 2017-01-18 DIAGNOSIS — N400 Enlarged prostate without lower urinary tract symptoms: Secondary | ICD-10-CM | POA: Diagnosis not present

## 2017-01-18 DIAGNOSIS — C9000 Multiple myeloma not having achieved remission: Secondary | ICD-10-CM | POA: Insufficient documentation

## 2017-01-18 DIAGNOSIS — I10 Essential (primary) hypertension: Secondary | ICD-10-CM | POA: Diagnosis not present

## 2017-01-18 DIAGNOSIS — K317 Polyp of stomach and duodenum: Secondary | ICD-10-CM | POA: Insufficient documentation

## 2017-01-18 DIAGNOSIS — K766 Portal hypertension: Secondary | ICD-10-CM | POA: Diagnosis not present

## 2017-01-18 DIAGNOSIS — D500 Iron deficiency anemia secondary to blood loss (chronic): Secondary | ICD-10-CM

## 2017-01-18 DIAGNOSIS — J849 Interstitial pulmonary disease, unspecified: Secondary | ICD-10-CM | POA: Insufficient documentation

## 2017-01-18 DIAGNOSIS — D509 Iron deficiency anemia, unspecified: Secondary | ICD-10-CM | POA: Insufficient documentation

## 2017-01-18 DIAGNOSIS — J470 Bronchiectasis with acute lower respiratory infection: Secondary | ICD-10-CM | POA: Insufficient documentation

## 2017-01-18 LAB — CBC WITH DIFFERENTIAL/PLATELET
Basophils Absolute: 0.1 10*3/uL (ref 0–0.1)
Basophils Relative: 1 %
Eosinophils Absolute: 0.3 10*3/uL (ref 0–0.7)
Eosinophils Relative: 8 %
HCT: 33.6 % — ABNORMAL LOW (ref 40.0–52.0)
Hemoglobin: 11.3 g/dL — ABNORMAL LOW (ref 13.0–18.0)
Lymphocytes Relative: 24 %
Lymphs Abs: 1.1 10*3/uL (ref 1.0–3.6)
MCH: 28.8 pg (ref 26.0–34.0)
MCHC: 33.5 g/dL (ref 32.0–36.0)
MCV: 85.8 fL (ref 80.0–100.0)
Monocytes Absolute: 0.5 10*3/uL (ref 0.2–1.0)
Monocytes Relative: 12 %
Neutro Abs: 2.5 10*3/uL (ref 1.4–6.5)
Neutrophils Relative %: 55 %
Platelets: 84 10*3/uL — ABNORMAL LOW (ref 150–440)
RBC: 3.91 MIL/uL — ABNORMAL LOW (ref 4.40–5.90)
RDW: 16.1 % — ABNORMAL HIGH (ref 11.5–14.5)
WBC: 4.4 10*3/uL (ref 3.8–10.6)

## 2017-01-18 LAB — COMPREHENSIVE METABOLIC PANEL
ALT: 26 U/L (ref 17–63)
AST: 31 U/L (ref 15–41)
Albumin: 3.2 g/dL — ABNORMAL LOW (ref 3.5–5.0)
Alkaline Phosphatase: 78 U/L (ref 38–126)
Anion gap: 6 (ref 5–15)
BUN: 29 mg/dL — ABNORMAL HIGH (ref 6–20)
CO2: 21 mmol/L — ABNORMAL LOW (ref 22–32)
Calcium: 9.6 mg/dL (ref 8.9–10.3)
Chloride: 110 mmol/L (ref 101–111)
Creatinine, Ser: 1.36 mg/dL — ABNORMAL HIGH (ref 0.61–1.24)
GFR calc Af Amer: 57 mL/min — ABNORMAL LOW (ref >60)
GFR calc non Af Amer: 49 mL/min — ABNORMAL LOW (ref >60)
Glucose, Bld: 240 mg/dL — ABNORMAL HIGH (ref 65–99)
Potassium: 4.9 mmol/L (ref 3.5–5.1)
Sodium: 137 mmol/L (ref 135–145)
Total Bilirubin: 1 mg/dL (ref 0.3–1.2)
Total Protein: 8.2 g/dL — ABNORMAL HIGH (ref 6.5–8.1)

## 2017-01-18 LAB — FERRITIN: Ferritin: 21 ng/mL — ABNORMAL LOW (ref 24–336)

## 2017-01-19 ENCOUNTER — Telehealth: Payer: Self-pay | Admitting: *Deleted

## 2017-01-19 ENCOUNTER — Ambulatory Visit: Payer: PPO | Admitting: Internal Medicine

## 2017-01-19 ENCOUNTER — Encounter: Payer: Self-pay | Admitting: Internal Medicine

## 2017-01-19 VITALS — BP 142/80 | HR 65 | Temp 97.6°F | Resp 16 | Ht 69.0 in | Wt 167.5 lb

## 2017-01-19 DIAGNOSIS — D649 Anemia, unspecified: Secondary | ICD-10-CM

## 2017-01-19 DIAGNOSIS — E7800 Pure hypercholesterolemia, unspecified: Secondary | ICD-10-CM | POA: Diagnosis not present

## 2017-01-19 DIAGNOSIS — I8510 Secondary esophageal varices without bleeding: Secondary | ICD-10-CM

## 2017-01-19 DIAGNOSIS — E1159 Type 2 diabetes mellitus with other circulatory complications: Secondary | ICD-10-CM

## 2017-01-19 DIAGNOSIS — R197 Diarrhea, unspecified: Secondary | ICD-10-CM

## 2017-01-19 DIAGNOSIS — D696 Thrombocytopenia, unspecified: Secondary | ICD-10-CM

## 2017-01-19 DIAGNOSIS — C9000 Multiple myeloma not having achieved remission: Secondary | ICD-10-CM | POA: Diagnosis not present

## 2017-01-19 DIAGNOSIS — K766 Portal hypertension: Secondary | ICD-10-CM

## 2017-01-19 DIAGNOSIS — D8989 Other specified disorders involving the immune mechanism, not elsewhere classified: Secondary | ICD-10-CM | POA: Diagnosis not present

## 2017-01-19 DIAGNOSIS — I10 Essential (primary) hypertension: Secondary | ICD-10-CM

## 2017-01-19 DIAGNOSIS — I25118 Atherosclerotic heart disease of native coronary artery with other forms of angina pectoris: Secondary | ICD-10-CM | POA: Diagnosis not present

## 2017-01-19 LAB — TSH: TSH: 4.29 u[IU]/mL (ref 0.35–4.50)

## 2017-01-19 LAB — HEMOGLOBIN A1C: Hgb A1c MFr Bld: 8.3 % — ABNORMAL HIGH (ref 4.6–6.5)

## 2017-01-19 LAB — HM DIABETES FOOT EXAM

## 2017-01-19 LAB — LIPID PANEL
CHOLESTEROL: 133 mg/dL (ref 0–200)
HDL: 21.4 mg/dL — ABNORMAL LOW (ref >39.00)
LDL CALC: 84 mg/dL (ref 0–99)
NONHDL: 111.13
Total CHOL/HDL Ratio: 6
Triglycerides: 138 mg/dL (ref 0.0–149.0)
VLDL: 27.6 mg/dL (ref 0.0–40.0)

## 2017-01-19 LAB — POCT GLUCOSE (DEVICE FOR HOME USE): GLUCOSE FASTING, POC: 157 mg/dL — AB (ref 70–99)

## 2017-01-19 NOTE — Progress Notes (Signed)
Patient ID: CINCERE ZORN, male   DOB: 1940/03/12, 76 y.o.   MRN: [id number]   Subjective:    Patient ID: KAZ AULD, male    DOB: 06-12-1940, 76 y.o.   MRN: [id number]  HPI  Patient here for a scheduled follow up.  He reports he is doing relatively well.  Is eating.  Tries to watch what he eats.  His wife helps him with his diet. His sugars are varying.  Blood sugars 150s in the am.  His lunch time sugars increase - 200-300.  Better in the evening.  No chest pain.  Breathing stable.  No acid reflux.  No increased abdominal pain.  Bowels moving.  Has diarrhea now.  Relates to lactulose.  Feels his thinking is better.  Is hunting and trying to stay active.  Up to date with eye exams.  States was evaluated last month.  No retinopathy.     Past Medical History:  Diagnosis Date  . Anemia   . Atherosclerotic heart disease    s/p MI, s/p stent mid circumflex  . CAD (coronary artery disease)    Dr Nehemiah Massed, cardiologist  . Cirrhosis of liver ([place])   . Clavicle fracture   . Diabetes mellitus without complication ([place])    oral med  . Elevated blood sugar    02/03/16 and 02/09/16 had BS over 400.  otherwise running around 250  . Esophageal varices ([place])   . Hepatic encephalopathy ([place])   . Hypercholesterolemia   . Hypertension    controlled on meds  . Internal hemorrhoids   . Leg fracture   . Multiple myeloma ([place])   . Myocardial infarct ([place])   . Prostate enlargement   . Tubular adenoma of colon    Past Surgical History:  Procedure Laterality Date  . COLONOSCOPY WITH PROPOFOL N/A 03/04/2015   Procedure: COLONOSCOPY WITH PROPOFOL;  Surgeon: Jerene Bears, MD;  Location: [facility];  Service: Gastroenterology;  Laterality: N/A;  . COLONOSCOPY WITH PROPOFOL N/A 12/09/2015   Procedure: COLONOSCOPY WITH PROPOFOL;  Surgeon: [name], MD;  Location: [place];  Service: Endoscopy;  Laterality: N/A;  DIABETIC-ORAL MEDS  . CORONARY ANGIOPLASTY WITH STENT  PLACEMENT     2 stents  . ESOPHAGOGASTRODUODENOSCOPY (EGD) WITH PROPOFOL N/A 03/04/2015   Procedure: ESOPHAGOGASTRODUODENOSCOPY (EGD) WITH PROPOFOL;  Surgeon: Jerene Bears, MD;  Location: [facility];  Service: Gastroenterology;  Laterality: N/A;  . ESOPHAGOGASTRODUODENOSCOPY (EGD) WITH PROPOFOL N/A 12/09/2015   Procedure: ESOPHAGOGASTRODUODENOSCOPY (EGD) WITH PROPOFOL;  Surgeon: [name], MD;  Location: [place];  Service: Endoscopy;  Laterality: N/A;  . ESOPHAGOGASTRODUODENOSCOPY (EGD) WITH PROPOFOL N/A 12/21/2015   Procedure: ESOPHAGOGASTRODUODENOSCOPY (EGD) WITH PROPOFOL;  Surgeon: [name], MD;  Location: [facility];  Service: Endoscopy;  Laterality: N/A;  . ESOPHAGOGASTRODUODENOSCOPY (EGD) WITH PROPOFOL N/A 02/24/2016   Procedure: ESOPHAGOGASTRODUODENOSCOPY (EGD) WITH PROPOFOL;  Surgeon: [name], MD;  Location: [place];  Service: Endoscopy;  Laterality: N/A;  diabetic - oral med  . POLYPECTOMY N/A 12/09/2015   Procedure: POLYPECTOMY;  Surgeon: [name], MD;  Location: [place];  Service: Endoscopy;  Laterality: N/A;  . TONSILLECTOMY     Family History  Problem Relation Age of Onset  . Aneurysm Father 87       of the heart  . Heart attack Father   . Diabetes Mother    Social History   Socioeconomic History  . Marital status: Married    Spouse name: None  . Number of children:  2  . Years of education: None  . Highest education level: None  Social Needs  . Financial resource strain: None  . Food insecurity - worry: None  . Food insecurity - inability: None  . Transportation needs - medical: None  . Transportation needs - non-medical: None  Occupational History  . Occupation: Engineer, building services: [employer]  Tobacco Use  . Smoking status: Never Smoker  . Smokeless tobacco: Never Used  Substance and Sexual Activity  . Alcohol use: No    Alcohol/week: 0.0 oz  . Drug use: No  . Sexual activity: None  Other Topics Concern  .  None  Social History Narrative  . None    Outpatient Encounter Medications as of 01/19/2017  Medication Sig  . Alpha-Lipoic Acid (LIPOIC ACID PO) Take 200 mg by mouth daily.   . Ascorbic Acid (VITAMIN C) 1000 MG tablet Take 1,000 mg by mouth daily.  . B Complex Vitamins (VITAMIN B COMPLEX) TABS Take by mouth daily.  . blood glucose meter kit and supplies KIT Dispense based on patient and insurance preference. Use up to four times daily as directed. E11.9 Please dispense Freestyle freedom meter and supplies.  . Cinnamon 500 MG capsule Take 500 mg by mouth.  . Coenzyme Q10 100 MG capsule Take 100 mg by mouth daily.   . furosemide (LASIX) 20 MG tablet Take 1 tablet (20 mg total) by mouth 2 (two) times daily.  Marland Kitchen glipiZIDE (GLUCOTROL) 5 MG tablet Take 1 tablet (5 mg total) by mouth See admin instructions.  Marland Kitchen glucose blood (ONE TOUCH ULTRA TEST) test strip Use as instructed  . glucose blood test strip Freestyle lite test strips, check blood sugar three times daily: Dx 250.00  . insulin aspart (NOVOLOG FLEXPEN) 100 UNIT/ML FlexPen Inject 0-9 Units into the skin 3 (three) times daily with meals.  . insulin glargine (LANTUS) 100 unit/mL SOPN Inject 0.1 mLs (10 Units total) into the skin at bedtime.  . Insulin Pen Needle (PEN NEEDLES) 31G X 6 MM MISC 1 application by Does not apply route 4 (four) times daily.  Marland Kitchen lactulose (CHRONULAC) 10 GM/15ML solution Take 30 mLs (20 g total) by mouth 4 (four) times daily.  . Lancets (ONETOUCH ULTRASOFT) lancets Use as instructed  . lisinopril (PRINIVIL,ZESTRIL) 20 MG tablet TAKE ONE TABLET BY MOUTH EVERY DAY/ AM  . MILK THISTLE PO Take 25 mg by mouth daily.  . Omega-3 Fatty Acids (FISH OIL) 1000 MG CAPS Take 4,000 mg by mouth daily.   . rifaximin (XIFAXAN) 550 MG TABS tablet Take 1 tablet (550 mg total) by mouth 2 (two) times daily.  . sildenafil (REVATIO) 20 MG tablet 3 to 5 tablets per day or as instructed  . Syringe/Needle, Disp, 30G X 1/2" 1 ML MISC 1 Syringe  by Does not apply route as directed.  . Theanine 100 MG CAPS Take by mouth daily.  Marland Kitchen triamcinolone cream (KENALOG) 0.1 % Apply topically 2 (two) times daily. Please avoid face and genitalia area.  Do not use in the same spot for more than 7-10 days in a row. Pharmacy changed to [facility].  . Turmeric POWD 4,000 mg by Does not apply route daily.  Marland Kitchen zinc sulfate 220 (50 Zn) MG capsule Take 1 capsule (220 mg total) by mouth 2 (two) times daily.   Facility-Administered Encounter Medications as of 01/19/2017  Medication  . alteplase (CATHFLO ACTIVASE) injection 2 mg  . heparin lock flush 100 unit/mL  . heparin lock flush 100  unit/mL  . sodium chloride 0.9 % injection 10 mL  . sodium chloride 0.9 % injection 3 mL    Review of Systems  Constitutional: Negative for appetite change and unexpected weight change.  HENT: Negative for congestion and sinus pressure.   Respiratory: Negative for cough, chest tightness and shortness of breath.   Cardiovascular: Negative for chest pain, palpitations and leg swelling.  Gastrointestinal: Negative for nausea and vomiting.       No increased abdominal pain.  Diarrhea with lactulose.    Genitourinary: Negative for difficulty urinating and dysuria.  Musculoskeletal: Negative for joint swelling and myalgias.  Skin: Negative for color change and rash.  Neurological: Negative for dizziness, light-headedness and headaches.  Psychiatric/Behavioral: Negative for agitation and dysphoric mood.       Objective:    Physical Exam  Constitutional: He appears well-developed and well-nourished. No distress.  HENT:  Nose: Nose normal.  Mouth/Throat: Oropharynx is clear and moist.  Neck: Neck supple. No thyromegaly present.  Cardiovascular: Normal rate and regular rhythm.  Pulmonary/Chest: Effort normal and breath sounds normal. No respiratory distress.  Abdominal: Soft. Bowel sounds are normal. There is no tenderness.  Musculoskeletal: He exhibits no edema or  tenderness.  Lymphadenopathy:    He has no cervical adenopathy.  Skin: No rash noted. No erythema.  Psychiatric: He has a normal mood and affect. His behavior is normal.    BP (!) 142/80 (BP Location: Left Arm, Patient Position: Sitting, Cuff Size: Normal)   Pulse 65   Temp 97.6 F (36.4 C) (Oral)   Resp 16   Ht 5' 9"  (1.753 m)   Wt 167 lb 8 oz (76 kg)   SpO2 97%   BMI 24.74 kg/m  Wt Readings from Last 3 Encounters:  01/19/17 167 lb 8 oz (76 kg)  01/09/17 167 lb (75.8 kg)  12/21/16 165 lb (74.8 kg)     Lab Results  Component Value Date   WBC 6.5 01/20/2017   HGB 11.5 (L) 01/20/2017   HCT 33.9 (L) 01/20/2017   PLT 92 (L) 01/20/2017   GLUCOSE 228 (H) 01/20/2017   CHOL 133 01/19/2017   TRIG 138.0 01/19/2017   HDL 21.40 (L) 01/19/2017   LDLDIRECT 56.0 12/17/2015   LDLCALC 84 01/19/2017   ALT 26 01/18/2017   AST 31 01/18/2017   NA 136 01/20/2017   K 4.6 01/20/2017   CL 111 01/20/2017   CREATININE 1.44 (H) 01/20/2017   BUN 30 (H) 01/20/2017   CO2 18 (L) 01/20/2017   TSH 4.29 01/19/2017   INR 1.12 10/01/2016   HGBA1C 8.3 (H) 01/19/2017   MICROALBUR 22.4 (H) 04/29/2015    US Abdomen Complete  Result Date: 10/10/2016 CLINICAL DATA:  Cirrhosis, portal hypertension, stage II multiple myeloma EXAM: ABDOMEN ULTRASOUND COMPLETE COMPARISON:  CT abdomen dated 03/16/2015 FINDINGS: Gallbladder: No gallstones or wall thickening visualized. No sonographic Murphy sign noted by sonographer. Common bile duct: Diameter: 5 mm Liver: Nodular hepatic contour. No focal hepatic lesion is seen. Portal vein is patent on color Doppler imaging with normal direction of blood flow towards the liver. IVC: No abnormality visualized. Pancreas: Visualized portion unremarkable. Spleen: Enlarged, measuring 16.8 x 15.3 x 8.3 cm (calculated volume 1113 mL). Right Kidney: Length: 10.4 cm. Echogenicity within normal limits. No mass or hydronephrosis visualized. Left Kidney: Length: 11.0 cm. Echogenicity  within normal limits. No mass or hydronephrosis visualized. Abdominal aorta: No aneurysm visualized. Other findings: Mild abdominal ascites. IMPRESSION: Nodular hepatic contour, suggesting cirrhosis. Patent portal vein with  normal direction of flow. Splenomegaly. Electronically Signed   By: Julian Hy M.D.   On: 10/10/2016 10:06       Assessment & Plan:   Problem List Items Addressed This Visit    Anemia    Has had extensive GI w/up.  Seeing hematology.  Receiving IV iron.  They are following cbc.       CAD (coronary artery disease)    Currently asymptomatic.  Sees cardiology.  Continue risk factor modification.        Diabetes ([place])    Discussed his diet and exercise.  Discussed sugars.  Sugars as outlined. Given his kidney function and liver issues, limited in some of the oral medications.  He was also in and out of the hospital previously.  Have been trying to control his sugars with insulin given above.  Long acting insulin with SSI with meals.  Sugars appear to be doing better, but still elevated.  Has an appt with endocrinology for further evaluation and to discuss treatment options.        Relevant Orders   Hemoglobin A1c (Completed)   POCT Glucose (Device for Home Use) (Completed)   Diarrhea    Persistent loose stools.  He relates to lactulose.  Follow.        Essential hypertension, benign    Blood pressure has been under reasonable control.  Same medication regimen.  Follow pressures.  Follow metabolic panel.        Relevant Orders   TSH (Completed)   Hypercholesterolemia - Primary    Follow lipid panel.        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       Multiple myeloma ([place])    Followed by hematology.        Portal hypertension ([facility])    Has cryptogenic cirhosis with portal hypertension.  Was on nadolol.  Off now.  Has been followed by GI.       Secondary esophageal varices  without bleeding ([facility])    Has been evaluated by GI - Dr Allen Norris.  Currently doing well.        Thrombocytopenia ([place])    Followed by hematology.            Einar Pheasant, MD

## 2017-01-19 NOTE — Progress Notes (Signed)
Pre-visit discussion using our clinic review tool. No additional management support is needed unless otherwise documented below in the visit note.  

## 2017-01-19 NOTE — Telephone Encounter (Signed)
Per Selena Lesser 01/18/17 staff mess. scheduled patient For Venofer x3. All appt were scheduled.  Called patient he is aware of his upcoming appt.

## 2017-01-20 ENCOUNTER — Emergency Department
Admission: EM | Admit: 2017-01-20 | Discharge: 2017-01-20 | Disposition: A | Discharge status: Home / Self Care | Payer: PPO | Attending: Emergency Medicine | Admitting: Emergency Medicine

## 2017-01-20 ENCOUNTER — Encounter: Payer: Self-pay | Admitting: Radiology

## 2017-01-20 ENCOUNTER — Other Ambulatory Visit: Payer: Self-pay

## 2017-01-20 ENCOUNTER — Emergency Department: Payer: PPO

## 2017-01-20 ENCOUNTER — Encounter: Payer: Self-pay | Admitting: Internal Medicine

## 2017-01-20 DIAGNOSIS — Y929 Unspecified place or not applicable: Secondary | ICD-10-CM | POA: Insufficient documentation

## 2017-01-20 DIAGNOSIS — S2231XA Fracture of one rib, right side, initial encounter for closed fracture: Secondary | ICD-10-CM | POA: Diagnosis not present

## 2017-01-20 DIAGNOSIS — Z79899 Other long term (current) drug therapy: Secondary | ICD-10-CM | POA: Insufficient documentation

## 2017-01-20 DIAGNOSIS — R0781 Pleurodynia: Secondary | ICD-10-CM | POA: Diagnosis not present

## 2017-01-20 DIAGNOSIS — S299XXA Unspecified injury of thorax, initial encounter: Secondary | ICD-10-CM | POA: Diagnosis not present

## 2017-01-20 DIAGNOSIS — R1011 Right upper quadrant pain: Secondary | ICD-10-CM | POA: Insufficient documentation

## 2017-01-20 DIAGNOSIS — E119 Type 2 diabetes mellitus without complications: Secondary | ICD-10-CM | POA: Diagnosis not present

## 2017-01-20 DIAGNOSIS — W14XXXA Fall from tree, initial encounter: Secondary | ICD-10-CM | POA: Diagnosis not present

## 2017-01-20 DIAGNOSIS — Y999 Unspecified external cause status: Secondary | ICD-10-CM | POA: Diagnosis not present

## 2017-01-20 DIAGNOSIS — S2241XA Multiple fractures of ribs, right side, initial encounter for closed fracture: Secondary | ICD-10-CM | POA: Insufficient documentation

## 2017-01-20 DIAGNOSIS — R109 Unspecified abdominal pain: Secondary | ICD-10-CM | POA: Diagnosis not present

## 2017-01-20 DIAGNOSIS — Z794 Long term (current) use of insulin: Secondary | ICD-10-CM | POA: Diagnosis not present

## 2017-01-20 DIAGNOSIS — I10 Essential (primary) hypertension: Secondary | ICD-10-CM | POA: Insufficient documentation

## 2017-01-20 DIAGNOSIS — Y9389 Activity, other specified: Secondary | ICD-10-CM | POA: Diagnosis not present

## 2017-01-20 DIAGNOSIS — I2510 Atherosclerotic heart disease of native coronary artery without angina pectoris: Secondary | ICD-10-CM | POA: Diagnosis not present

## 2017-01-20 DIAGNOSIS — W19XXXA Unspecified fall, initial encounter: Secondary | ICD-10-CM

## 2017-01-20 LAB — CBC WITH DIFFERENTIAL/PLATELET
Basophils Absolute: 0.1 10*3/uL (ref 0–0.1)
Basophils Relative: 1 %
EOS ABS: 0.2 10*3/uL (ref 0–0.7)
Eosinophils Relative: 4 %
HEMATOCRIT: 33.9 % — AB (ref 40.0–52.0)
HEMOGLOBIN: 11.5 g/dL — AB (ref 13.0–18.0)
LYMPHS ABS: 0.8 10*3/uL — AB (ref 1.0–3.6)
LYMPHS PCT: 13 %
MCH: 29.1 pg (ref 26.0–34.0)
MCHC: 34 g/dL (ref 32.0–36.0)
MCV: 85.7 fL (ref 80.0–100.0)
MONOS PCT: 10 %
Monocytes Absolute: 0.6 10*3/uL (ref 0.2–1.0)
NEUTROS ABS: 4.7 10*3/uL (ref 1.4–6.5)
NEUTROS PCT: 72 %
Platelets: 92 10*3/uL — ABNORMAL LOW (ref 150–440)
RBC: 3.95 MIL/uL — AB (ref 4.40–5.90)
RDW: 16.1 % — ABNORMAL HIGH (ref 11.5–14.5)
WBC: 6.5 10*3/uL (ref 3.8–10.6)

## 2017-01-20 LAB — BASIC METABOLIC PANEL
Anion gap: 7 (ref 5–15)
BUN: 30 mg/dL — ABNORMAL HIGH (ref 6–20)
CHLORIDE: 111 mmol/L (ref 101–111)
CO2: 18 mmol/L — AB (ref 22–32)
CREATININE: 1.44 mg/dL — AB (ref 0.61–1.24)
Calcium: 9.5 mg/dL (ref 8.9–10.3)
GFR calc non Af Amer: 46 mL/min — ABNORMAL LOW (ref >60)
GFR, EST AFRICAN AMERICAN: 53 mL/min — AB (ref >60)
Glucose, Bld: 228 mg/dL — ABNORMAL HIGH (ref 65–99)
POTASSIUM: 4.6 mmol/L (ref 3.5–5.1)
Sodium: 136 mmol/L (ref 135–145)

## 2017-01-20 MED ORDER — ONDANSETRON HCL 4 MG/2ML IJ SOLN
4.0000 mg | Freq: Once | INTRAMUSCULAR | Status: AC
  Administered 2017-01-20: 4 mg via INTRAVENOUS

## 2017-01-20 MED ORDER — ONDANSETRON HCL 4 MG/2ML IJ SOLN
INTRAMUSCULAR | Status: AC
  Administered 2017-01-20: 4 mg via INTRAVENOUS
  Filled 2017-01-20: qty 2

## 2017-01-20 MED ORDER — MORPHINE SULFATE (PF) 2 MG/ML IV SOLN
INTRAVENOUS | Status: AC
  Administered 2017-01-20: 2 mg via INTRAVENOUS
  Filled 2017-01-20: qty 1

## 2017-01-20 MED ORDER — TRAMADOL HCL 50 MG PO TABS: 50.0000 mg | ORAL_TABLET | Freq: Four times a day (QID) | ORAL | 0 refills | Status: AC | PRN

## 2017-01-20 MED ORDER — MORPHINE SULFATE (PF) 2 MG/ML IV SOLN
2.0000 mg | Freq: Once | INTRAVENOUS | Status: AC
  Administered 2017-01-20: 2 mg via INTRAVENOUS

## 2017-01-20 MED ORDER — IOPAMIDOL (ISOVUE-300) INJECTION 61%
100.0000 mL | Freq: Once | INTRAVENOUS | Status: AC | PRN
  Administered 2017-01-20: 100 mL via INTRAVENOUS

## 2017-01-20 NOTE — ED Provider Notes (Signed)
[facility] Emergency Department Provider Note ____________________________________________   First MD Initiated Contact with Patient 01/20/17 1949     (approximate)  I have reviewed the triage vital signs and the nursing notes.   HISTORY  Chief Complaint Fall    HPI Zachary Buck is a 76 y.o. male with past medical history as noted below who presents with right sided rib pain, acute onset a few hours ago when he fell 12 feet from a tree stand while hunting deer.  Patient states that he missed a step and had a mechanical fall.  He denies feeling dizzy or lightheaded.  He states he hit his right arm, right leg and the right side of his chest and abdomen.  He denies hitting his head.  No LOC.  Patient has been ambulatory since the event.  Past Medical History:  Diagnosis Date  . Anemia   . Atherosclerotic heart disease    s/p MI, s/p stent mid circumflex  . CAD (coronary artery disease)    Dr Nehemiah Massed, cardiologist  . Cirrhosis of liver ([place])   . Clavicle fracture   . Diabetes mellitus without complication ([place])    oral med  . Elevated blood sugar    02/03/16 and 02/09/16 had BS over 400.  otherwise running around 250  . Esophageal varices ([place])   . Hepatic encephalopathy ([place])   . Hypercholesterolemia   . Hypertension    controlled on meds  . Internal hemorrhoids   . Leg fracture   . Multiple myeloma ([place])   . Myocardial infarct ([place])   . Prostate enlargement   . Tubular adenoma of colon     Patient Active Problem List   Diagnosis Date Noted  . URI (upper respiratory infection) 12/24/2016  . Splenomegaly 10/11/2016  . Hepatic encephalopathy ([place]) 09/30/2016  . Fatigue 04/25/2016  . Iron deficiency anemia due to chronic blood loss   . Dizziness 02/15/2016  . Melena   . 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 Benign neoplasm of ascending colon   . Polyp of sigmoid colon   . 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 Incomplete emptying of bladder 09/01/2015  . 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 IDA (iron deficiency anemia)   . Heme positive stool   . Gastric polyp   . Angiodysplasia of duodenum   . Secondary esophageal varices without bleeding ([place])   . Portal hypertension ([place])   . Iron deficiency anemia 12/10/2014  . Abnormal PET of right lung 12/07/2014  . Multiple myeloma ([place]) 11/10/2014  . 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 Thrombocytopenia ([place]) 10/09/2014  . Awareness of heartbeats 09/18/2014  . Nasal lesion 08/26/2014  . Rash 08/26/2014  . Breathlessness on exertion 06/24/2014  . Combined fat and carbohydrate induced hyperlipemia 06/12/2014  . Benign essential HTN 06/11/2014  . Throat congestion 03/08/2014  . Abdominal pain 10/15/2013  . Arm mass 10/15/2013  . Mononeuropathy of upper extremity 09/27/2013  . Neuropathy, upper extremity 09/27/2013  . Elevated prostate specific antigen (PSA) 08/15/2013  . Diarrhea 07/13/2013  . D (diarrhea) 07/13/2013  . Abnormal chest CT 06/22/2013  . Bilateral arm pain 10/11/2012  . 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 Benign prostatic hyperplasia with urinary obstruction 07/23/2012  . Calculus of kidney 07/11/2012  . Calculi, ureter 07/11/2012  . Hydronephrosis 07/11/2012  . Renal colic 92/44/6286  . Diabetes ([place]) 05/29/2012  . Hypercholesterolemia 05/21/2012  .  History of colonic polyps 05/21/2012  . CAD (coronary artery disease) 05/21/2012  . Anemia 05/21/2012  . Essential hypertension, benign 05/21/2012  . Absolute anemia 05/21/2012    Past Surgical History:  Procedure Laterality Date  . COLONOSCOPY WITH PROPOFOL N/A 03/04/2015   Procedure: COLONOSCOPY WITH PROPOFOL;  Surgeon: Jerene Bears, MD;  Location: [facility];  Service: Gastroenterology;  Laterality: N/A;  . COLONOSCOPY WITH PROPOFOL N/A 12/09/2015   Procedure: COLONOSCOPY WITH PROPOFOL;   Surgeon: [name], MD;  Location: [place];  Service: Endoscopy;  Laterality: N/A;  DIABETIC-ORAL MEDS  . CORONARY ANGIOPLASTY WITH STENT PLACEMENT     2 stents  . ESOPHAGOGASTRODUODENOSCOPY (EGD) WITH PROPOFOL N/A 03/04/2015   Procedure: ESOPHAGOGASTRODUODENOSCOPY (EGD) WITH PROPOFOL;  Surgeon: Jerene Bears, MD;  Location: [facility];  Service: Gastroenterology;  Laterality: N/A;  . ESOPHAGOGASTRODUODENOSCOPY (EGD) WITH PROPOFOL N/A 12/09/2015   Procedure: ESOPHAGOGASTRODUODENOSCOPY (EGD) WITH PROPOFOL;  Surgeon: [name], MD;  Location: [place];  Service: Endoscopy;  Laterality: N/A;  . ESOPHAGOGASTRODUODENOSCOPY (EGD) WITH PROPOFOL N/A 12/21/2015   Procedure: ESOPHAGOGASTRODUODENOSCOPY (EGD) WITH PROPOFOL;  Surgeon: [name], MD;  Location: [facility];  Service: Endoscopy;  Laterality: N/A;  . ESOPHAGOGASTRODUODENOSCOPY (EGD) WITH PROPOFOL N/A 02/24/2016   Procedure: ESOPHAGOGASTRODUODENOSCOPY (EGD) WITH PROPOFOL;  Surgeon: [name], MD;  Location: [place];  Service: Endoscopy;  Laterality: N/A;  diabetic - oral med  . POLYPECTOMY N/A 12/09/2015   Procedure: POLYPECTOMY;  Surgeon: [name], MD;  Location: [place];  Service: Endoscopy;  Laterality: N/A;  . TONSILLECTOMY      Prior to Admission medications   Medication Sig Start Date End Date Taking? Authorizing Provider  Alpha-Lipoic Acid (LIPOIC ACID PO) Take 200 mg by mouth daily.     [provider]  Ascorbic Acid (VITAMIN C) 1000 MG tablet Take 1,000 mg by mouth daily.    [provider]  B Complex Vitamins (VITAMIN B COMPLEX) TABS Take by mouth daily.    [provider]  blood glucose meter kit and supplies KIT Dispense based on patient and insurance preference. Use up to four times daily as directed. E11.9 Please dispense Freestyle freedom meter and supplies. 09/09/15   Coral Spikes, DO  Cinnamon 500 MG capsule Take 500 mg by mouth.    [provider]  Coenzyme Q10 100 MG capsule Take 100 mg by mouth daily.     [provider]  furosemide (LASIX) 20 MG tablet Take 1 tablet (20 mg total) by mouth 2 (two) times daily. 01/03/16   [name], MD  glipiZIDE (GLUCOTROL) 5 MG tablet Take 1 tablet (5 mg total) by mouth See admin instructions. 10/02/16   Einar Pheasant, MD  glucose blood (ONE TOUCH ULTRA TEST) test strip Use as instructed 11/18/15   Einar Pheasant, MD  glucose blood test strip Freestyle lite test strips, check blood sugar three times daily: Dx 250.00 11/01/16   Einar Pheasant, MD  insulin aspart (NOVOLOG FLEXPEN) 100 UNIT/ML FlexPen Inject 0-9 Units into the skin 3 (three) times daily with meals. 11/10/16   Einar Pheasant, MD  insulin glargine (LANTUS) 100 unit/mL SOPN Inject 0.1 mLs (10 Units total) into the skin at bedtime. 11/14/16   Einar Pheasant, MD  Insulin Pen Needle (PEN NEEDLES) 31G X 6 MM MISC 1 application by Does not apply route 4 (four) times daily. 11/10/16   Einar Pheasant, MD  lactulose (CHRONULAC) 10 GM/15ML solution Take 30 mLs (20 g total) by mouth 4 (  four) times daily. 10/13/16   [name], MD  Lancets Glory [place] ULTRASOFT) lancets Use as instructed 11/01/16   Einar Pheasant, MD  lisinopril (PRINIVIL,ZESTRIL) 20 MG tablet TAKE ONE TABLET BY MOUTH EVERY DAY/ AM 01/11/15   [provider]  MILK THISTLE PO Take 25 mg by mouth daily.    [provider]  Omega-3 Fatty Acids (FISH OIL) 1000 MG CAPS Take 4,000 mg by mouth daily.     [provider]  rifaximin (XIFAXAN) 550 MG TABS tablet Take 1 tablet (550 mg total) by mouth 2 (two) times daily. 07/31/16   [name], MD  sildenafil (REVATIO) 20 MG tablet 3 to 5 tablets per day or as instructed 09/01/15   [provider]  Syringe/Needle, Disp, 30G X 1/2" 1 ML MISC 1 Syringe by Does not apply route as directed. 11/01/16   Einar Pheasant, MD  Theanine 100 MG CAPS Take by mouth daily.    [provider]    triamcinolone cream (KENALOG) 0.1 % Apply topically 2 (two) times daily. Please avoid face and genitalia area.  Do not use in the same spot for more than 7-10 days in a row. Pharmacy changed to [facility]. 08/31/16   Einar Pheasant, MD  Turmeric POWD 4,000 mg by Does not apply route daily.    [provider]  zinc sulfate 220 (50 Zn) MG capsule Take 1 capsule (220 mg total) by mouth 2 (two) times daily. 10/03/16   [name], MD    Allergies Niaspan [niacin er]  Family History  Problem Relation Age of Onset  . Aneurysm Father 66       of the heart  . Heart attack Father   . Diabetes Mother     Social History Social History   Tobacco Use  . Smoking status: Never Smoker  . Smokeless tobacco: Never Used  Substance Use Topics  . Alcohol use: No    Alcohol/week: 0.0 oz  . Drug use: No    Review of Systems  Constitutional: No fever.  Eyes: No redness. ENT: No neck pain. Cardiovascular: Denies chest pain. Respiratory: Denies shortness of breath. Gastrointestinal: No nausea, no vomiting.   Genitourinary: Negative for hematuria.  Musculoskeletal: Negative for back pain. Skin: Negative for rash. Neurological: Negative for headache.    ____________________________________________   PHYSICAL EXAM:  VITAL SIGNS: ED Triage Vitals  Enc Vitals Group     BP 01/20/17 1924 (!) 159/72     Pulse Rate 01/20/17 1924 92     Resp 01/20/17 1924 20     Temp 01/20/17 1924 98.2 F (36.8 C)     Temp Source 01/20/17 1924 Oral     SpO2 01/20/17 1924 96 %     Weight --      Height --      Head Circumference --      Peak Flow --      Pain Score 01/20/17 1923 7     Pain Loc --      Pain Edu? --      Excl. in [place]? --     Constitutional: Alert and oriented. Well appearing and in no acute distress. Eyes: Conjunctivae are normal.  Head: Atraumatic. Nose: No congestion/rhinnorhea. Mouth/Throat: Mucous membranes are moist.   Neck: Normal range of motion.  No midline C-spine  tenderness. Cardiovascular: Good peripheral circulation.  Right lateral chest wall tenderness with crepitus. Respiratory: Normal respiratory effort.  No retractions. Lungs CTAB. Gastrointestinal: Soft with moderate right upper quadrant tenderness. No  distention.  Genitourinary: No CVA tenderness. Musculoskeletal: No lower extremity edema.  Extremities warm and well perfused.  Neurologic:  Normal speech and language. No gross focal neurologic deficits are appreciated.  Skin:  Skin is warm and dry. No rash noted. Psychiatric: Mood and affect are normal. Speech and behavior are normal.  ____________________________________________   LABS (all labs ordered are listed, but only abnormal results are displayed)  Labs Reviewed  BASIC METABOLIC PANEL  CBC WITH DIFFERENTIAL/PLATELET   ____________________________________________  EKG   ____________________________________________  RADIOLOGY  Rib series: pending CT abd: pending  ____________________________________________   PROCEDURES  Procedure(s) performed: No    Critical Care performed: No ____________________________________________   INITIAL IMPRESSION / ASSESSMENT AND PLAN / ED COURSE  Pertinent labs & imaging results that were available during my care of the patient were reviewed by me and considered in my medical decision making (see chart for details).  76 year old male with past medical history as noted above presents with right-sided rib pain after mechanical fall from approximately 12 foot height.  He also reports hitting his right arm and leg, but denies head injury.  Review of past medical records in Epic is noncontributory to this visit.  On exam, patient is extremely well-appearing, vital signs are normal except for hypertension, however he does have significant tenderness to the right lateral rib area with some palpable crepitus, as well as tenderness in the right upper quadrant of the abdomen.  He has a  nonfocal neuro exam, no midline spinal tenderness, and no bony tenderness or deformity to the extremities.  Presentation is concerning for right-sided rib fracture.  Will obtain a rib series to rule out multiple contiguous fractures or any complication.  Patient also has tenderness in the right side of his abdomen.  Patient does have ascites and liver disease, and he states that some of this pain is chronic, however given the likely rib fracture close to this area, I have some concern for possible liver or other intra-abdominal injury.  Patient is hemodynamically stable, and comfortable when not moving around, so the likelihood of significant injury is low, however due to the tenderness I will obtain a CT of the abdomen.  If no intra-abdominal injury, and no evidence of multiple contiguous rib fractures, possible discharge home.    ----------------------------------------- 8:13 PM on 01/20/2017 -----------------------------------------  Patient signed out to oncoming physician Dr. Archie Balboa.  Plan for x-ray and CT as discussed above.  If negative workup or minimal rib fracture, possible discharge home.  ____________________________________________   FINAL CLINICAL IMPRESSION(S) / ED DIAGNOSES  Final diagnoses:  Rib pain on right side      NEW MEDICATIONS STARTED DURING THIS VISIT:  This SmartLink is deprecated. Use AVSMEDLIST instead to display the medication list for a patient.   Note:  This document was prepared using Dragon voice recognition software and may include unintentional dictation errors.    Arta Silence, MD 01/20/17 2014

## 2017-01-20 NOTE — ED Notes (Signed)
Incentive spirometer given to pt, instructions for use reviewed with pt w/ pt demonstration back.  Pt able to achieve 1581mL with one attempt, other attempts 1000-1266mL, goal set for 1250.  Pt and family educated on goal setting and verbalize understanding.  Activity restrictions discussed.

## 2017-01-20 NOTE — ED Notes (Signed)
Pt ambulatory to tx room, report fall from tree stand, report pain to right elbow, right knee, right ribs.  Abrasion noted to right knee, abrasion to back, skin tear to right elbow, dressed with non-adherent dressing.

## 2017-01-20 NOTE — Discharge Instructions (Signed)
Please seek medical attention for any high fevers, chest pain, shortness of breath, change in behavior, persistent vomiting, bloody stool or any other new or concerning symptoms.  

## 2017-01-20 NOTE — ED Provider Notes (Signed)
CT abd/pel Right 8th, 9th and 10th rib fractures with small pulmonary contusion.  I discussed this finding with the patient.  I had a long discussion with patient and family about complications from this type of injury.  I did offer admission.  Patient's pain however was fairly well controlled.  He was able to use the incentive spirometer without significant discomfort.  Patient felt comfortable going home.  At this point I think that is reasonable given that his pain is controlled he has incentive spirometer it is able to use it.  We did have a long discussion about pneumonia return precautions.  Again I reiterated that I would be happy to admit him however he declined.  Will also discharge with small course of tramadol. [place] drug database was checked prior to prescription.   Nance Pear, MD 01/20/17 [phone]

## 2017-01-20 NOTE — ED Triage Notes (Addendum)
Reports he was in tree stand (12 ft) and fell out of the tree.  Denies dizziness or loss of consciousness.   Reports back pain, right rib pain, right elbow and right knee.

## 2017-01-21 ENCOUNTER — Encounter: Payer: Self-pay | Admitting: Internal Medicine

## 2017-01-21 NOTE — Assessment & Plan Note (Signed)
-

## 2017-01-21 NOTE — Assessment & Plan Note (Signed)
Has been evaluated by GI - Dr Allen Norris.  Currently doing well.

## 2017-01-21 NOTE — Assessment & Plan Note (Signed)
Has cryptogenic cirhosis with portal hypertension.  Was on nadolol.  Off now.  Has been followed by GI.

## 2017-01-21 NOTE — Assessment & Plan Note (Signed)
Has had extensive GI w/up.  Seeing hematology.  Receiving IV iron.  They are following cbc.

## 2017-01-21 NOTE — Assessment & Plan Note (Signed)
Followed by hematology 

## 2017-01-21 NOTE — Assessment & Plan Note (Signed)
Currently asymptomatic.  Sees cardiology.  Continue risk factor modification.

## 2017-01-21 NOTE — Assessment & Plan Note (Signed)
Follow lipid panel.   

## 2017-01-21 NOTE — Assessment & Plan Note (Signed)
Blood pressure has been under reasonable control.  Same medication regimen.  Follow pressures.  Follow metabolic panel.   

## 2017-01-21 NOTE — Assessment & Plan Note (Signed)
Discussed his diet and exercise.  Discussed sugars.  Sugars as outlined. Given his kidney function and liver issues, limited in some of the oral medications.  He was also in and out of the hospital previously.  Have been trying to control his sugars with insulin given above.  Long acting insulin with SSI with meals.  Sugars appear to be doing better, but still elevated.  Has an appt with endocrinology for further evaluation and to discuss treatment options.

## 2017-01-21 NOTE — Assessment & Plan Note (Signed)
Persistent loose stools.  He relates to lactulose.  Follow.

## 2017-01-22 ENCOUNTER — Encounter: Payer: Self-pay | Admitting: Hematology and Oncology

## 2017-01-22 ENCOUNTER — Encounter: Payer: Self-pay | Admitting: Internal Medicine

## 2017-01-22 LAB — PROTEIN ELECTROPHORESIS, SERUM
A/G Ratio: 0.7 (ref 0.7–1.7)
Albumin ELP: 3.2 g/dL (ref 2.9–4.4)
Alpha-1-Globulin: 0.2 g/dL (ref 0.0–0.4)
Alpha-2-Globulin: 0.5 g/dL (ref 0.4–1.0)
Beta Globulin: 0.8 g/dL (ref 0.7–1.3)
Gamma Globulin: 2.8 g/dL — ABNORMAL HIGH (ref 0.4–1.8)
Globulin, Total: 4.3 g/dL — ABNORMAL HIGH (ref 2.2–3.9)
M-Spike, %: 2.6 g/dL — ABNORMAL HIGH
Total Protein ELP: 7.5 g/dL (ref 6.0–8.5)

## 2017-01-23 ENCOUNTER — Encounter: Payer: Self-pay | Admitting: Internal Medicine

## 2017-01-24 ENCOUNTER — Encounter: Payer: Self-pay | Admitting: Hematology and Oncology

## 2017-01-25 ENCOUNTER — Encounter: Payer: Self-pay | Admitting: Internal Medicine

## 2017-01-25 ENCOUNTER — Telehealth: Payer: Self-pay | Admitting: *Deleted

## 2017-01-25 NOTE — Telephone Encounter (Signed)
Wife called wanting to cancel appointment tomorrow and keep infusions for the 14 th and 21 st, She would like to add the third infusion to 12/28. He feel 12 feet from a tree stand while deer hunting and is in significant pain. Schedulers can you make these appointment changes and call his wife back Please, Thank you

## 2017-01-26 ENCOUNTER — Inpatient Hospital Stay: Payer: PPO

## 2017-01-26 MED ORDER — TRIAMCINOLONE ACETONIDE 0.1 % EX CREA: TOPICAL_CREAM | Freq: Two times a day (BID) | CUTANEOUS | 0 refills | Status: DC

## 2017-01-26 NOTE — Telephone Encounter (Signed)
rx sent in for triamcinolone cream.

## 2017-02-02 ENCOUNTER — Inpatient Hospital Stay: Payer: PPO | Attending: Hematology and Oncology

## 2017-02-02 VITALS — BP 162/76 | HR 79 | Resp 16

## 2017-02-02 DIAGNOSIS — J849 Interstitial pulmonary disease, unspecified: Secondary | ICD-10-CM | POA: Insufficient documentation

## 2017-02-02 DIAGNOSIS — K529 Noninfective gastroenteritis and colitis, unspecified: Secondary | ICD-10-CM | POA: Diagnosis not present

## 2017-02-02 DIAGNOSIS — N400 Enlarged prostate without lower urinary tract symptoms: Secondary | ICD-10-CM | POA: Diagnosis not present

## 2017-02-02 DIAGNOSIS — I10 Essential (primary) hypertension: Secondary | ICD-10-CM | POA: Insufficient documentation

## 2017-02-02 DIAGNOSIS — R161 Splenomegaly, not elsewhere classified: Secondary | ICD-10-CM | POA: Diagnosis not present

## 2017-02-02 DIAGNOSIS — K317 Polyp of stomach and duodenum: Secondary | ICD-10-CM | POA: Insufficient documentation

## 2017-02-02 DIAGNOSIS — D509 Iron deficiency anemia, unspecified: Secondary | ICD-10-CM | POA: Diagnosis not present

## 2017-02-02 DIAGNOSIS — I2510 Atherosclerotic heart disease of native coronary artery without angina pectoris: Secondary | ICD-10-CM | POA: Insufficient documentation

## 2017-02-02 DIAGNOSIS — Z79899 Other long term (current) drug therapy: Secondary | ICD-10-CM | POA: Diagnosis not present

## 2017-02-02 DIAGNOSIS — K760 Fatty (change of) liver, not elsewhere classified: Secondary | ICD-10-CM | POA: Insufficient documentation

## 2017-02-02 DIAGNOSIS — J61 Pneumoconiosis due to asbestos and other mineral fibers: Secondary | ICD-10-CM | POA: Insufficient documentation

## 2017-02-02 DIAGNOSIS — I252 Old myocardial infarction: Secondary | ICD-10-CM | POA: Diagnosis not present

## 2017-02-02 DIAGNOSIS — K648 Other hemorrhoids: Secondary | ICD-10-CM | POA: Insufficient documentation

## 2017-02-02 DIAGNOSIS — J470 Bronchiectasis with acute lower respiratory infection: Secondary | ICD-10-CM | POA: Insufficient documentation

## 2017-02-02 DIAGNOSIS — D500 Iron deficiency anemia secondary to blood loss (chronic): Secondary | ICD-10-CM

## 2017-02-02 DIAGNOSIS — K766 Portal hypertension: Secondary | ICD-10-CM | POA: Diagnosis not present

## 2017-02-02 DIAGNOSIS — J189 Pneumonia, unspecified organism: Secondary | ICD-10-CM | POA: Insufficient documentation

## 2017-02-02 DIAGNOSIS — I8510 Secondary esophageal varices without bleeding: Secondary | ICD-10-CM | POA: Insufficient documentation

## 2017-02-02 DIAGNOSIS — K7469 Other cirrhosis of liver: Secondary | ICD-10-CM | POA: Insufficient documentation

## 2017-02-02 DIAGNOSIS — C9000 Multiple myeloma not having achieved remission: Secondary | ICD-10-CM | POA: Diagnosis not present

## 2017-02-02 DIAGNOSIS — K2970 Gastritis, unspecified, without bleeding: Secondary | ICD-10-CM | POA: Diagnosis not present

## 2017-02-02 MED ORDER — SODIUM CHLORIDE 0.9 % IV SOLN
Freq: Once | INTRAVENOUS | Status: AC
  Administered 2017-02-02: 14:00:00 via INTRAVENOUS
  Filled 2017-02-02: qty 1000

## 2017-02-02 MED ORDER — IRON SUCROSE 20 MG/ML IV SOLN
200.0000 mg | Freq: Once | INTRAVENOUS | Status: AC
  Administered 2017-02-02: 200 mg via INTRAVENOUS
  Filled 2017-02-02: qty 10

## 2017-02-06 ENCOUNTER — Encounter: Payer: Self-pay | Admitting: Internal Medicine

## 2017-02-07 ENCOUNTER — Other Ambulatory Visit: Payer: Self-pay | Admitting: Hematology and Oncology

## 2017-02-08 ENCOUNTER — Other Ambulatory Visit: Payer: Self-pay

## 2017-02-08 MED ORDER — FUROSEMIDE 20 MG PO TABS: 20.0000 mg | ORAL_TABLET | Freq: Two times a day (BID) | ORAL | 5 refills | Status: AC

## 2017-02-08 MED ORDER — SPIRONOLACTONE 50 MG PO TABS: 50.0000 mg | ORAL_TABLET | Freq: Every day | ORAL | 6 refills | Status: AC

## 2017-02-08 NOTE — Telephone Encounter (Signed)
Talked with patient this morning said he still hurting some but feels better today,  Patient wife stated he had a rash but has cleared up today .  Does not feel they need to be seen at this time.

## 2017-02-09 ENCOUNTER — Inpatient Hospital Stay: Payer: PPO

## 2017-02-09 DIAGNOSIS — C9000 Multiple myeloma not having achieved remission: Secondary | ICD-10-CM | POA: Diagnosis not present

## 2017-02-09 DIAGNOSIS — D500 Iron deficiency anemia secondary to blood loss (chronic): Secondary | ICD-10-CM

## 2017-02-09 MED ORDER — IRON SUCROSE 20 MG/ML IV SOLN
200.0000 mg | Freq: Once | INTRAVENOUS | Status: AC
  Administered 2017-02-09: 200 mg via INTRAVENOUS
  Filled 2017-02-09: qty 10

## 2017-02-16 ENCOUNTER — Inpatient Hospital Stay: Payer: PPO

## 2017-02-16 VITALS — BP 146/78 | HR 80 | Temp 97.4°F | Resp 18

## 2017-02-16 DIAGNOSIS — D500 Iron deficiency anemia secondary to blood loss (chronic): Secondary | ICD-10-CM

## 2017-02-16 DIAGNOSIS — C9000 Multiple myeloma not having achieved remission: Secondary | ICD-10-CM | POA: Diagnosis not present

## 2017-02-16 MED ORDER — IRON SUCROSE 20 MG/ML IV SOLN
200.0000 mg | Freq: Once | INTRAVENOUS | Status: AC
  Administered 2017-02-16: 200 mg via INTRAVENOUS
  Filled 2017-02-16: qty 10

## 2017-02-28 ENCOUNTER — Other Ambulatory Visit: Payer: Self-pay | Admitting: *Deleted

## 2017-02-28 ENCOUNTER — Inpatient Hospital Stay: Payer: PPO | Attending: Hematology and Oncology

## 2017-02-28 DIAGNOSIS — Z794 Long term (current) use of insulin: Secondary | ICD-10-CM | POA: Diagnosis not present

## 2017-02-28 DIAGNOSIS — G629 Polyneuropathy, unspecified: Secondary | ICD-10-CM | POA: Diagnosis not present

## 2017-02-28 DIAGNOSIS — E1159 Type 2 diabetes mellitus with other circulatory complications: Secondary | ICD-10-CM | POA: Diagnosis not present

## 2017-02-28 DIAGNOSIS — C9000 Multiple myeloma not having achieved remission: Secondary | ICD-10-CM

## 2017-02-28 DIAGNOSIS — R161 Splenomegaly, not elsewhere classified: Secondary | ICD-10-CM | POA: Insufficient documentation

## 2017-02-28 DIAGNOSIS — D696 Thrombocytopenia, unspecified: Secondary | ICD-10-CM | POA: Diagnosis not present

## 2017-02-28 DIAGNOSIS — K7460 Unspecified cirrhosis of liver: Secondary | ICD-10-CM | POA: Diagnosis not present

## 2017-02-28 DIAGNOSIS — E1142 Type 2 diabetes mellitus with diabetic polyneuropathy: Secondary | ICD-10-CM | POA: Diagnosis not present

## 2017-02-28 DIAGNOSIS — R7989 Other specified abnormal findings of blood chemistry: Secondary | ICD-10-CM | POA: Diagnosis not present

## 2017-02-28 DIAGNOSIS — I10 Essential (primary) hypertension: Secondary | ICD-10-CM | POA: Diagnosis not present

## 2017-02-28 LAB — COMPREHENSIVE METABOLIC PANEL
ALT: 23 U/L (ref 17–63)
AST: 28 U/L (ref 15–41)
Albumin: 3.2 g/dL — ABNORMAL LOW (ref 3.5–5.0)
Alkaline Phosphatase: 89 U/L (ref 38–126)
Anion gap: 6 (ref 5–15)
BUN: 28 mg/dL — ABNORMAL HIGH (ref 6–20)
CO2: 22 mmol/L (ref 22–32)
Calcium: 9.5 mg/dL (ref 8.9–10.3)
Chloride: 108 mmol/L (ref 101–111)
Creatinine, Ser: 1.24 mg/dL (ref 0.61–1.24)
GFR calc Af Amer: >60 mL/min (ref >60)
GFR calc non Af Amer: 54 mL/min — ABNORMAL LOW (ref >60)
Glucose, Bld: 219 mg/dL — ABNORMAL HIGH (ref 65–99)
Potassium: 4.6 mmol/L (ref 3.5–5.1)
Sodium: 136 mmol/L (ref 135–145)
Total Bilirubin: 1.3 mg/dL — ABNORMAL HIGH (ref 0.3–1.2)
Total Protein: 8.1 g/dL (ref 6.5–8.1)

## 2017-02-28 LAB — CBC WITH DIFFERENTIAL/PLATELET
Basophils Absolute: 0.1 10*3/uL (ref 0–0.1)
Basophils Relative: 1 %
Eosinophils Absolute: 0.5 10*3/uL (ref 0–0.7)
Eosinophils Relative: 8 %
HCT: 34.3 % — ABNORMAL LOW (ref 40.0–52.0)
Hemoglobin: 11.5 g/dL — ABNORMAL LOW (ref 13.0–18.0)
Lymphocytes Relative: 21 %
Lymphs Abs: 1.2 10*3/uL (ref 1.0–3.6)
MCH: 29.2 pg (ref 26.0–34.0)
MCHC: 33.7 g/dL (ref 32.0–36.0)
MCV: 86.5 fL (ref 80.0–100.0)
Monocytes Absolute: 0.6 10*3/uL (ref 0.2–1.0)
Monocytes Relative: 10 %
Neutro Abs: 3.3 10*3/uL (ref 1.4–6.5)
Neutrophils Relative %: 60 %
Platelets: 79 10*3/uL — ABNORMAL LOW (ref 150–440)
RBC: 3.96 MIL/uL — ABNORMAL LOW (ref 4.40–5.90)
RDW: 16.2 % — ABNORMAL HIGH (ref 11.5–14.5)
WBC: 5.6 10*3/uL (ref 3.8–10.6)

## 2017-02-28 LAB — FERRITIN: Ferritin: 145 ng/mL (ref 24–336)

## 2017-03-01 ENCOUNTER — Telehealth: Payer: Self-pay | Admitting: *Deleted

## 2017-03-01 ENCOUNTER — Other Ambulatory Visit: Payer: Self-pay

## 2017-03-01 ENCOUNTER — Ambulatory Visit
Admission: RE | Admit: 2017-03-01 | Discharge: 2017-03-01 | Disposition: A | Discharge status: Home / Self Care | Payer: PPO | Source: Ambulatory Visit | Attending: Hematology and Oncology | Admitting: Hematology and Oncology

## 2017-03-01 ENCOUNTER — Other Ambulatory Visit: Payer: PPO

## 2017-03-01 ENCOUNTER — Inpatient Hospital Stay: Payer: PPO

## 2017-03-01 ENCOUNTER — Encounter: Payer: Self-pay | Admitting: Hematology and Oncology

## 2017-03-01 ENCOUNTER — Inpatient Hospital Stay (HOSPITAL_BASED_OUTPATIENT_CLINIC_OR_DEPARTMENT_OTHER): Payer: PPO | Admitting: Hematology and Oncology

## 2017-03-01 VITALS — BP 174/74 | HR 69 | Temp 97.0°F | Resp 12 | Ht 69.0 in | Wt 162.2 lb

## 2017-03-01 DIAGNOSIS — C9000 Multiple myeloma not having achieved remission: Secondary | ICD-10-CM

## 2017-03-01 DIAGNOSIS — R161 Splenomegaly, not elsewhere classified: Secondary | ICD-10-CM | POA: Diagnosis not present

## 2017-03-01 DIAGNOSIS — I779 Disorder of arteries and arterioles, unspecified: Secondary | ICD-10-CM | POA: Insufficient documentation

## 2017-03-01 DIAGNOSIS — K7460 Unspecified cirrhosis of liver: Secondary | ICD-10-CM

## 2017-03-01 DIAGNOSIS — S2241XD Multiple fractures of ribs, right side, subsequent encounter for fracture with routine healing: Secondary | ICD-10-CM | POA: Insufficient documentation

## 2017-03-01 DIAGNOSIS — W19XXXD Unspecified fall, subsequent encounter: Secondary | ICD-10-CM | POA: Insufficient documentation

## 2017-03-01 DIAGNOSIS — D696 Thrombocytopenia, unspecified: Secondary | ICD-10-CM

## 2017-03-01 DIAGNOSIS — D500 Iron deficiency anemia secondary to blood loss (chronic): Secondary | ICD-10-CM

## 2017-03-01 LAB — PROTEIN ELECTROPHORESIS, SERUM
A/G Ratio: 0.8 (ref 0.7–1.7)
Albumin ELP: 3.3 g/dL (ref 2.9–4.4)
Alpha-1-Globulin: 0.2 g/dL (ref 0.0–0.4)
Alpha-2-Globulin: 0.5 g/dL (ref 0.4–1.0)
Beta Globulin: 0.7 g/dL (ref 0.7–1.3)
Gamma Globulin: 3 g/dL — ABNORMAL HIGH (ref 0.4–1.8)
Globulin, Total: 4.4 g/dL — ABNORMAL HIGH (ref 2.2–3.9)
M-Spike, %: 2.8 g/dL — ABNORMAL HIGH
Total Protein ELP: 7.7 g/dL (ref 6.0–8.5)

## 2017-03-01 NOTE — Progress Notes (Signed)
Patient had recent fall that resulted in three broke ribs, and bruised lungs.

## 2017-03-01 NOTE — Progress Notes (Signed)
[facility] day:  03/01/2017   Chief Complaint: Zachary Buck is a 77 y.o. male  with stage II multiple myeloma and iron deficiency anemia who is seen for 3 month assessment.  HPI: The patient was last seen in the medical oncology clinic on 12/07/2016.  At that time, he was feeling well. He denied any episodes of altered mental status.  Hemoglobin was 11.6, hematocrit 34.0, and platelets 85,000.  Renal function had improved; BUN 19 with a creatinine of 1.33. Albumin 3.0.  Ferritin was 54. Blood glucose was 243.  He received the influenza vaccine.  M spike was 2.6 gm/dL on 01/18/2017.  Ferritin was 21.  He received weekly Venofer x 3 (02/02/2017 - 02/16/2017).  He fell from a deer stand with resultant rib and abdominal pain.  Abdomen and pelvic CT on 01/20/2017 revealed acute posterior right eighth, ninth, and tenth rib fractures with small right lower lobe pulmonary contusion.  There was no acute traumatic injury to the abdomen or pelvis.  There was cirrhosis with small amount of perihepatic ascites measuring simple fluid density.  There was portal hypertension with splenomegaly (16.3 cm) and perigastric and periesophageal varices.  During the interim, he has done well.  He notes decreased appetite after his fall and rib fractures.  He denies any fevers or infections.   Past Medical History:  Diagnosis Date  . Anemia   . Atherosclerotic heart disease    s/p MI, s/p stent mid circumflex  . CAD (coronary artery disease)    Dr Nehemiah Massed, cardiologist  . Cirrhosis of liver ([place])   . Clavicle fracture   . Diabetes mellitus without complication ([place])    oral med  . Elevated blood sugar    02/03/16 and 02/09/16 had BS over 400.  otherwise running around 250  . Esophageal varices ([place])   . Hepatic encephalopathy ([place])   . Hypercholesterolemia   . Hypertension    controlled on meds  . Internal hemorrhoids   . Leg fracture   . Multiple  myeloma ([place])   . Myocardial infarct ([place])   . Prostate enlargement   . Tubular adenoma of colon     Past Surgical History:  Procedure Laterality Date  . COLONOSCOPY WITH PROPOFOL N/A 03/04/2015   Procedure: COLONOSCOPY WITH PROPOFOL;  Surgeon: Jerene Bears, MD;  Location: [facility];  Service: Gastroenterology;  Laterality: N/A;  . COLONOSCOPY WITH PROPOFOL N/A 12/09/2015   Procedure: COLONOSCOPY WITH PROPOFOL;  Surgeon: [name], MD;  Location: [facility];  Service: Endoscopy;  Laterality: N/A;  DIABETIC-ORAL MEDS  . CORONARY ANGIOPLASTY WITH STENT PLACEMENT     2 stents  . ESOPHAGOGASTRODUODENOSCOPY (EGD) WITH PROPOFOL N/A 03/04/2015   Procedure: ESOPHAGOGASTRODUODENOSCOPY (EGD) WITH PROPOFOL;  Surgeon: Jerene Bears, MD;  Location: [facility];  Service: Gastroenterology;  Laterality: N/A;  . ESOPHAGOGASTRODUODENOSCOPY (EGD) WITH PROPOFOL N/A 12/09/2015   Procedure: ESOPHAGOGASTRODUODENOSCOPY (EGD) WITH PROPOFOL;  Surgeon: [name], MD;  Location: [facility];  Service: Endoscopy;  Laterality: N/A;  . ESOPHAGOGASTRODUODENOSCOPY (EGD) WITH PROPOFOL N/A 12/21/2015   Procedure: ESOPHAGOGASTRODUODENOSCOPY (EGD) WITH PROPOFOL;  Surgeon: [name], MD;  Location: [facility];  Service: Endoscopy;  Laterality: N/A;  . ESOPHAGOGASTRODUODENOSCOPY (EGD) WITH PROPOFOL N/A 02/24/2016   Procedure: ESOPHAGOGASTRODUODENOSCOPY (EGD) WITH PROPOFOL;  Surgeon: [name], MD;  Location: [place];  Service: Endoscopy;  Laterality: N/A;  diabetic - oral med  . POLYPECTOMY N/A 12/09/2015   Procedure: POLYPECTOMY;  Surgeon: [name],  MD;  Location: [place];  Service: Endoscopy;  Laterality: N/A;  . TONSILLECTOMY      Family History  Problem Relation Age of Onset  . Aneurysm Father 3       of the heart  . Heart attack Father   . Diabetes Mother     Social History:  reports that  has never smoked. he has never used smokeless tobacco. He reports that  he does not drink alcohol or use drugs.  Patient denies any exposure to radiation or toxins.  He is a Company secretary.  He lives in [place].  The patient is accompanied by his wife, [name], today.  Allergies:  Allergies  Allergen Reactions  . Niaspan [Niacin Er] Other (See Comments)    Just does not want to take     Current Medications: Current Outpatient Medications  Medication Sig Dispense Refill  . Alpha-Lipoic Acid (LIPOIC ACID PO) Take 200 mg by mouth daily.     . Ascorbic Acid (VITAMIN C) 1000 MG tablet Take 1,000 mg by mouth daily.    . B Complex Vitamins (VITAMIN B COMPLEX) TABS Take by mouth daily.    . blood glucose meter kit and supplies KIT Dispense based on patient and insurance preference. Use up to four times daily as directed. E11.9 Please dispense Freestyle freedom meter and supplies. 1 each 0  . Cinnamon 500 MG capsule Take 500 mg by mouth.    . Coenzyme Q10 100 MG capsule Take 100 mg by mouth daily.     . furosemide (LASIX) 20 MG tablet Take 1 tablet (20 mg total) by mouth 2 (two) times daily. 60 tablet 5  . glipiZIDE (GLUCOTROL) 5 MG tablet Take 1 tablet (5 mg total) by mouth See admin instructions. 270 tablet 1  . glucose blood (ONE TOUCH ULTRA TEST) test strip Use as instructed 100 each 12  . glucose blood test strip Freestyle lite test strips, check blood sugar three times daily: Dx 250.00 100 each 3  . insulin aspart (NOVOLOG FLEXPEN) 100 UNIT/ML FlexPen Inject 0-9 Units into the skin 3 (three) times daily with meals. 15 mL 11  . insulin glargine (LANTUS) 100 unit/mL SOPN Inject 0.1 mLs (10 Units total) into the skin at bedtime. 15 mL 11  . Insulin Pen Needle (PEN NEEDLES) 31G X 6 MM MISC 1 application by Does not apply route 4 (four) times daily. 200 each 4  . lactulose (CHRONULAC) 10 GM/15ML solution Take 30 mLs (20 g total) by mouth 4 (four) times daily. 946 mL 6  . Lancets (ONETOUCH ULTRASOFT) lancets Use as instructed 100 each 12  . lisinopril (PRINIVIL,ZESTRIL)  20 MG tablet TAKE ONE TABLET BY MOUTH EVERY DAY/ AM    . MILK THISTLE PO Take 25 mg by mouth daily.    . Omega-3 Fatty Acids (FISH OIL) 1000 MG CAPS Take 4,000 mg by mouth daily.     . rifaximin (XIFAXAN) 550 MG TABS tablet Take 1 tablet (550 mg total) by mouth 2 (two) times daily. 60 tablet 11  . sildenafil (REVATIO) 20 MG tablet 3 to 5 tablets per day or as instructed    . spironolactone (ALDACTONE) 50 MG tablet Take 1 tablet (50 mg total) by mouth daily. 30 tablet 6  . Syringe/Needle, Disp, 30G X 1/2" 1 ML MISC 1 Syringe by Does not apply route as directed. 150 each 6  . Theanine 100 MG CAPS Take by mouth daily.    . traMADol (ULTRAM) 50 MG tablet Take  1 tablet (50 mg total) by mouth every 6 (six) hours as needed. 15 tablet 0  . triamcinolone cream (KENALOG) 0.1 % Apply topically 2 (two) times daily. Please avoid face and genitalia area.  Do not use in the same spot for more than 7-10 days in a row. Pharmacy changed to [facility]. 80 g 0  . Turmeric POWD 4,000 mg by Does not apply route daily.    Marland Kitchen zinc sulfate 220 (50 Zn) MG capsule Take 1 capsule (220 mg total) by mouth 2 (two) times daily.     No current facility-administered medications for this visit.    Facility-Administered Medications Ordered in Other Visits  Medication Dose Route Frequency Provider Last Rate Last Dose  . alteplase (CATHFLO ACTIVASE) injection 2 mg  2 mg Intracatheter Once PRN Nolon Stalls C, MD      . heparin lock flush 100 unit/mL  500 Units Intracatheter Once PRN Mike Gip, Melissa C, MD      . heparin lock flush 100 unit/mL  250 Units Intracatheter Once PRN Mike Gip, Melissa C, MD      . sodium chloride 0.9 % injection 10 mL  10 mL Intracatheter PRN Corcoran, Melissa C, MD      . sodium chloride 0.9 % injection 3 mL  3 mL Intravenous Once PRN Lequita Asal, MD        Review of Systems:  GENERAL:  Feels "ok".  No fevers or sweats.  Weight up 1 pound. PERFORMANCE STATUS (ECOG):  1 HEENT:  No visual  changes, runny nose, sore throat, mouth sores or tenderness. Lungs: No shortness of breath or cough.  No hemoptysis. Cardiac:  No chest pain, palpitations, orthopnea, or PND. GI:  No nausea, vomiting, constipation, or hematochezia.  GU:  No urgency, frequency, dysuria, or hematuria. Musculoskeletal:  No back pain.  No joint pain.  No muscle tenderness. Extremities:  No pain or swelling. Skin:  No rashes or skin changes. Neuro:  Short term memory issues.  Trouble concentrating.  Interval encephalopathy resolved with lactulose.  No headache, numbness or weakness, balance or coordination issues. Endocrine:  Diabetes (blood sugar in the 300s).  No thyroid issues, hot flashes or night sweats. Psych:  No mood changes, depression or anxiety. Pain:  No pain. Review of systems:  All other systems reviewed and found to be negative.  Physical Exam: BP (!) 174/74 Comment: Recheck  Pulse 69 Comment: Recheck  Temp (!) 97 F (36.1 C) (Tympanic)   Resp 12   Ht _0  (1.753 m)   Wt 162 lb 3.2 oz (73.6 kg)   BMI 23.95 kg/m   GENERAL:  Well developed, well nourished, gentleman sitting comfortably in the exam room in no acute distress. MENTAL STATUS:  Alert and oriented to person, place and time. HEAD:  Pearline Cables hair.  Normocephalic, atraumatic, face symmetric, no Cushingoid features. EYES:  Glasses.  Blue eyes.  Pupils equal round and reactive to light and accomodation.  No conjunctivitis or scleral icterus. ENT:  Oropharynx clear without lesion.  Tongue normal. Mucous membranes moist.  RESPIRATORY:  Clear to auscultation without rales, wheezes or rhonchi. CARDIOVASCULAR:  Regular rate and rhythm without murmur, rub or gallop. ABDOMEN:  Soft, non tender with active bowel sounds, and no appreciable hepatosplenomegaly.  No masses.  No guarding or rebound tenderness. SKIN:  No rashes, ulcers or lesions. EXTREMITIES: No edema, no skin discoloration or tenderness.  No palpable cords. LYMPH NODES: No palpable  cervical, supraclavicular, axillary or inguinal adenopathy  NEUROLOGICAL: Unremarkable.  PSYCH:  Appropriate.    Appointment on 02/28/2017  Component Date Value Ref Range Status  . Ferritin 02/28/2017 145  24 - 336 ng/mL Final   Performed at [facility], [address] [place], [place] [address]  . Total Protein ELP 02/28/2017 7.7  6.0 - 8.5 g/dL Final  . Albumin ELP 02/28/2017 3.3  2.9 - 4.4 g/dL Final  . Alpha-1-Globulin 02/28/2017 0.2  0.0 - 0.4 g/dL Final  . Alpha-2-Globulin 02/28/2017 0.5  0.4 - 1.0 g/dL Final  . Beta Globulin 02/28/2017 0.7  0.7 - 1.3 g/dL Final  . Gamma Globulin 02/28/2017 3.0* 0.4 - 1.8 g/dL Final  . M-Spike, % 02/28/2017 2.8* Not Observed g/dL Final  . SPE Interp. 02/28/2017 Comment   Final   Comment: (NOTE) The SPE pattern demonstrates a single peak (M-spike) in the gamma region which may represent monoclonal protein. This peak may also be caused by circulating immune complexes, cryoglobulins, C-reactive protein, fibrinogen or hemolysis.  If clinically indicated, the presence of a monoclonal gammopathy may be confirmed by immuno- fixation, as well as an evaluation of the urine for the presence of Bence-Jones protein. Performed At: [facility] [place], [place] [id number] Rush Farmer MD [id number]   . Comment 02/28/2017 Comment   Final   Comment: (NOTE) Protein electrophoresis scan will follow via computer, mail, or courier delivery.   Marland Kitchen GLOBULIN, TOTAL 02/28/2017 4.4* 2.2 - 3.9 g/dL Corrected  . A/G Ratio 02/28/2017 0.8  0.7 - 1.7 Corrected   Performed at [facility], [address], [place], [place] [address]  . Sodium 02/28/2017 136  135 - 145 mmol/L Final  . Potassium 02/28/2017 4.6  3.5 - 5.1 mmol/L Final  . Chloride 02/28/2017 108  101 - 111 mmol/L Final  . CO2 02/28/2017 22  22 - 32 mmol/L Final  . Glucose, Bld 02/28/2017 219* 65 - 99 mg/dL Final  . BUN 02/28/2017 28* 6 - 20 mg/dL Final  .  Creatinine, Ser 02/28/2017 1.24  0.61 - 1.24 mg/dL Final  . Calcium 02/28/2017 9.5  8.9 - 10.3 mg/dL Final  . Total Protein 02/28/2017 8.1  6.5 - 8.1 g/dL Final  . Albumin 02/28/2017 3.2* 3.5 - 5.0 g/dL Final  . AST 02/28/2017 28  15 - 41 U/L Final  . ALT 02/28/2017 23  17 - 63 U/L Final  . Alkaline Phosphatase 02/28/2017 89  38 - 126 U/L Final  . Total Bilirubin 02/28/2017 1.3* 0.3 - 1.2 mg/dL Final  . GFR calc non Af Amer 02/28/2017 54* >60 mL/min Final  . GFR calc Af Amer 02/28/2017 >60  >60 mL/min Final   Comment: (NOTE) The eGFR has been calculated using the CKD EPI equation. This calculation has not been validated in all clinical situations. eGFR's persistently <60 mL/min signify possible Chronic Kidney Disease.   Georgiann Hahn gap 02/28/2017 6  5 - 15 Final   Performed at [facility], [address] [place], [place] [address]  . WBC 02/28/2017 5.6  3.8 - 10.6 K/uL Final  . RBC 02/28/2017 3.96* 4.40 - 5.90 MIL/uL Final  . Hemoglobin 02/28/2017 11.5* 13.0 - 18.0 g/dL Final  . HCT 02/28/2017 34.3* 40.0 - 52.0 % Final  . MCV 02/28/2017 86.5  80.0 - 100.0 fL Final  . MCH 02/28/2017 29.2  26.0 - 34.0 pg Final  . MCHC 02/28/2017 33.7  32.0 - 36.0 g/dL Final  . RDW 02/28/2017 16.2* 11.5 - 14.5 % Final  . Platelets 02/28/2017  79* 150 - 440 K/uL Final  . Neutrophils Relative % 02/28/2017 60  % Final  . Neutro Abs 02/28/2017 3.3  1.4 - 6.5 K/uL Final  . Lymphocytes Relative 02/28/2017 21  % Final  . Lymphs Abs 02/28/2017 1.2  1.0 - 3.6 K/uL Final  . Monocytes Relative 02/28/2017 10  % Final  . Monocytes Absolute 02/28/2017 0.6  0.2 - 1.0 K/uL Final  . Eosinophils Relative 02/28/2017 8  % Final  . Eosinophils Absolute 02/28/2017 0.5  0 - 0.7 K/uL Final  . Basophils Relative 02/28/2017 1  % Final  . Basophils Absolute 02/28/2017 0.1  0 - 0.1 K/uL Final   Performed at [facility], [address], [place], [place] [address]    Assessment:  YU PEGGS is a 77  y.o. male with iron deficiency anemia and stage II multiple myeloma. 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 Serum immunoglobulins noted an IgG of 2930 (high). 24-hour urine revealed a 8.8% monoclonal protein (10.6 mg/24 hours).  Albumen was 3.3 on 08/26/2014. He had pneumonia in 05/2014.   Bone survey on 10/20/2014 revealed no lytic lesions. Bone survey on 09/16/2015 revealed no focal lytic lesion or acute bony abnormality.   SPEP has been followed:  2.3 gm/dL on 10/09/2014, 2.3 gm/dL on 01/18/2015, 2.3 gm/dL on 03/22/2015, 2.3 gm/dL on 06/18/2015, 2.3 gm/dL on 09/13/2015, 2.3 gm/dL on 11/08/2015, 2.7 gm/dL on 01/11/2016, 2.8 gm/dL on 02/17/2016, 2.4 gm/dL on 03/28/2016, 2.8 gm/dL on 04/24/2016, 2.3 gm/dL on 06/01/2016, 2.5 on 07/03/2016, 2.7 on 08/03/2016, 2.8 on 08/30/2016, 2.5 on 10/04/2016, 2.4 on 11/06/2016, 2.6 on 01/18/2017, and 2.8 on 03/01/2017.   Lambda free light chainswere155.3 (ratio 0.15) on 01/18/2015, 174.59 (ratio of 0.16)on 06/18/2015, 182.9 (ratio of 0.13)on 09/13/2015, 205 (ratio 0.11) on 11/08/2015, 292.7 (ratio 0.09) on 01/11/2016, and 167.0 (ratio 0.13) on 03/27/2016.  Bone marrow on 11/10/2014 revealed a 10% monoclonal plasma cell infiltrate. Marrow was hypercellular for age (40-50%) with mixed maturing hematopoiesis, relative erythroid hyperplasia and mild nonspecific dyserythropoiesis. There was patchy mild focally moderate increase in reticulin. There were no significant iron stores. Myeloma FISH panel revealed t(11;14) which results in fusion of CCND1 (BCL1) at 11q13 with the immunoglobulin heavy chain gene (IgH) at 14q32 (a favorable prognostic feature).   Bone marrow on 03/14/2016 revealed persistent monoclonal plasma cell infiltrate of 10-15%.  Marrow was hypercellular for age (40-50%) with trilineage hematopoiesis, increase eosinophils, adequate megakaryocytes and no  increase in blasts. There was mild patchy increase in reticulin fibers. There was stainable iron consistent with recent parenteral iron.  Cytogenetics were normal (46, XY). FISH studies revealed CCND1/IGH translocation t(11;14).  Beta2 microglobulin was 4.2 on 11/24/2014, 3.8 on 06/18/2015, 4.5 on 09/13/2015, 4.5 on 10/18/2015, and 4.6 on 04/24/2016.  PET scan on 12/07/2014 revealed no hypermetabolic foci within the marrow space or nodal stations to suggest active multiple myeloma. There was hypermetabolism within the right lower lobe, corresponding to a similar dependent density. Morphology favored scarring.There was cirrhosis and portal hypertension. There was gastric body hypermetabolism favoring physiologic. Spleen was 15.3 cm (volume 1065 cc).  There was pulmonary artery enlargement suggesting pulmonary artery hypertension. 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  He has a history of asbestos exposure in the [employer].  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  Etiology is felt secondary to fatty liver.  Abdominal CT scan on 03/16/2015 revealed splenomegaly (14.5 cm), cirrhosis, and stigmata  of portal hypertension with esophageal and gastric varices.  There were no liver lesions.  He is on aldactone, lactulose, and Xifaxan.  Abdominal ultrasound on 10/10/2016 demonstrated nodular hepatic contour, suggesting cirrhosis.  There were no focal hepatic lesion is seen. Portal vein was patent on color Doppler imaging with normal direction of blood flow towards the liver. He has splenomegaly (16.8 x 15.3 x 8.3 cm).  Abdomen and pelvic CT on 01/20/2017 revealed acute posterior right eighth, ninth, and tenth rib fractures with small right lower lobe pulmonary contusion.  There was no acute traumatic injury to the abdomen or pelvis.  There was cirrhosis with small amount of perihepatic ascites measuring simple fluid density.  There was portal hypertension with splenomegaly  (16.3 cm) and perigastric and periesophageal varices.  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  There were no pleural effusions.  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  There was no frank honeycombing. This finding accounted for the medial right lower lobe hypermetabolism on the 12/07/2014 PET-CT.  There was cirrhosis, splenomegaly, and prominent gastroesophageal varices.  He has had mild anemia since 10/2013 and mild thrombocytopenia since 05/2014. Thrombocytopenia is felt secondary to underlying liver disease and subsequent splenomegaly.  Colonoscopy in 10/2013 revealed polyps.  Colonoscopy on 03/04/2015 revealed a sessile polyp was seen in the transverse colon (tubular adenoma).  Colonoscopy on 12/09/2015 revealed one 4 mm polyp in the ascending colon and two 4-5 mm polyps in the sigmoid colon (hyperplastic polyps).    EGD in 10/2013 revealed gastritis. EGD on 03/04/2015 revealed medium-sized esophageal varices in the mid and distal esophagus without bleeding. There was a sessile polyp in the gastric antrum (hyperplastic polyp).  There were 3 small angiodysplastic lesions in the second part of the duodenum.  EGD on 12/09/2015 revealed grade II esophageal varices.  There was a single gastric polyp.  There was a single bleeding angioectasia in the duodenum treated with bipolar cautery and clips.  EGD on 02/24/2016 revealed no gross lesions in the esophagus. There was a single gastric polyp and a duodenal polyp. Resection was attempted. No specimens were collected  Capsule enteroscopy on 11/27/2014 revealed 1-2 gastric polyps and one colon polyp.  There were no findings to explain iron deficiency anemia.  His diet was good. Labs confirmed iron deficiency  (ferritin 11.8 on 05/2014 and 13 on 10/09/2014). B12 was low normal (MMA normal) thus ruling out B12 deficiency.  He has chronic diarrhea (2 years).  Diarrhea improved on Enterogam (began 06/22/2015).  He has intermittent diarrhea.    He has recurrent iron deficiency anemia.  He has received Venofer weekly 3 (03/26/2015 - 04/14/2015), weekly x 3 (06/25/2015 - 07/09/2015), weekly x 3 (09/17/2015 - 10/01/2015), weekly x 3 (11/16/2015 - 11/30/2015), weekly x 2 (01/18/2016 - 01/25/2016),  weekly x 3 (02/18/2016 - 03/03/2016), 200 mg (05/04/2016), 600 mg (07/04/2016 - 07/18/2016), and 600 mg (02/02/2017 - 02/16/2017).    Ferritin has been followed:  23 on 06/18/2015, 121 on 07/26/2015, 17 on 09/13/2015, 18 on 11/15/2015, 52 on 12/13/2015, 10 on 01/11/2016, 88 on 02/03/2016, 23 on 02/17/2016, 46 on 03/27/2016, 20 on 04/24/2016, 31 on 06/01/2016, 16 on 07/03/2016, 119 on 08/03/2016, 55 on 08/30/2016, 53 on 10/04/2016, 35 on 11/06/2016, 54 on 12/06/2016, 21 on 01/18/2017, and 145 on 02/28/2017.  Symptomatically, He feels good.  He has lost weight  after fracturing ribs s/p a fall from a deer stand.  He denies any episodes of altered mental status.  Hemoglobin is 11.5, hematocrit 34.3, and platelets 79,000.  Creatinine is 1.24.  Albumin 3.2.  Ferritin is 145.  Plan: 1.  Review labs from 02/28/2017.  Hemoglobin is stable.  Ferritin is 145. 2.  Discuss thrombocytopenia and mild increase in splenomegaly likely secondary to underlying liver disease. 3.  Schedule yearly bone survey. 4.  RTC in 6 weeks for labs (CBC with diff, CMP, SPEP, AFP, ferritin), +/- Venofer 5.  RTC in 3 months for MD assessment, labs (CBC with diff, CMP, SPEP, ferritin-day before), and +/- Venofer.   Lequita Asal, MD  03/01/2017, 3:35 PM

## 2017-03-04 ENCOUNTER — Encounter: Payer: Self-pay | Admitting: Hematology and Oncology

## 2017-04-04 DIAGNOSIS — E1159 Type 2 diabetes mellitus with other circulatory complications: Secondary | ICD-10-CM | POA: Diagnosis not present

## 2017-04-04 DIAGNOSIS — I10 Essential (primary) hypertension: Secondary | ICD-10-CM | POA: Diagnosis not present

## 2017-04-04 DIAGNOSIS — Z794 Long term (current) use of insulin: Secondary | ICD-10-CM | POA: Diagnosis not present

## 2017-04-04 DIAGNOSIS — E1142 Type 2 diabetes mellitus with diabetic polyneuropathy: Secondary | ICD-10-CM | POA: Diagnosis not present

## 2017-04-12 ENCOUNTER — Telehealth: Payer: Self-pay | Admitting: Urgent Care

## 2017-04-12 ENCOUNTER — Inpatient Hospital Stay: Payer: PPO | Attending: Hematology and Oncology

## 2017-04-12 ENCOUNTER — Other Ambulatory Visit: Payer: Self-pay | Admitting: Urgent Care

## 2017-04-12 DIAGNOSIS — C9000 Multiple myeloma not having achieved remission: Secondary | ICD-10-CM | POA: Diagnosis not present

## 2017-04-12 DIAGNOSIS — D500 Iron deficiency anemia secondary to blood loss (chronic): Secondary | ICD-10-CM

## 2017-04-12 DIAGNOSIS — D696 Thrombocytopenia, unspecified: Secondary | ICD-10-CM

## 2017-04-12 DIAGNOSIS — K7460 Unspecified cirrhosis of liver: Secondary | ICD-10-CM | POA: Diagnosis not present

## 2017-04-12 DIAGNOSIS — E875 Hyperkalemia: Secondary | ICD-10-CM

## 2017-04-12 LAB — CBC WITH DIFFERENTIAL/PLATELET
Basophils Absolute: 0 10*3/uL (ref 0–0.1)
Basophils Relative: 1 %
Eosinophils Absolute: 0.4 10*3/uL (ref 0–0.7)
Eosinophils Relative: 8 %
HCT: 32.8 % — ABNORMAL LOW (ref 40.0–52.0)
Hemoglobin: 11.1 g/dL — ABNORMAL LOW (ref 13.0–18.0)
Lymphocytes Relative: 18 %
Lymphs Abs: 1 10*3/uL (ref 1.0–3.6)
MCH: 29.4 pg (ref 26.0–34.0)
MCHC: 33.9 g/dL (ref 32.0–36.0)
MCV: 86.8 fL (ref 80.0–100.0)
Monocytes Absolute: 0.5 10*3/uL (ref 0.2–1.0)
Monocytes Relative: 10 %
Neutro Abs: 3.4 10*3/uL (ref 1.4–6.5)
Neutrophils Relative %: 63 %
Platelets: 72 10*3/uL — ABNORMAL LOW (ref 150–440)
RBC: 3.77 MIL/uL — ABNORMAL LOW (ref 4.40–5.90)
RDW: 16.5 % — ABNORMAL HIGH (ref 11.5–14.5)
WBC: 5.3 10*3/uL (ref 3.8–10.6)

## 2017-04-12 LAB — COMPREHENSIVE METABOLIC PANEL
ALT: 21 U/L (ref 17–63)
AST: 23 U/L (ref 15–41)
Albumin: 3.1 g/dL — ABNORMAL LOW (ref 3.5–5.0)
Alkaline Phosphatase: 91 U/L (ref 38–126)
Anion gap: 7 (ref 5–15)
BUN: 26 mg/dL — ABNORMAL HIGH (ref 6–20)
CO2: 19 mmol/L — ABNORMAL LOW (ref 22–32)
Calcium: 9.4 mg/dL (ref 8.9–10.3)
Chloride: 112 mmol/L — ABNORMAL HIGH (ref 101–111)
Creatinine, Ser: 1.38 mg/dL — ABNORMAL HIGH (ref 0.61–1.24)
GFR calc Af Amer: 55 mL/min — ABNORMAL LOW (ref >60)
GFR calc non Af Amer: 48 mL/min — ABNORMAL LOW (ref >60)
Glucose, Bld: 190 mg/dL — ABNORMAL HIGH (ref 65–99)
Potassium: 5.6 mmol/L — ABNORMAL HIGH (ref 3.5–5.1)
Sodium: 138 mmol/L (ref 135–145)
Total Bilirubin: 0.9 mg/dL (ref 0.3–1.2)
Total Protein: 8 g/dL (ref 6.5–8.1)

## 2017-04-12 LAB — FERRITIN: Ferritin: 64 ng/mL (ref 24–336)

## 2017-04-12 NOTE — Telephone Encounter (Signed)
Called patient to make him aware of elevated K+ of 5.6. No answer; LMOM. Suspected sample hemolysis. Will need a recollected. BMP order entered. Asked patient to return a call to the clinic to make a lab appointment for recollect.

## 2017-04-13 LAB — PROTEIN ELECTROPHORESIS, SERUM
A/G Ratio: 0.9 (ref 0.7–1.7)
Albumin ELP: 3.4 g/dL (ref 2.9–4.4)
Alpha-1-Globulin: 0.2 g/dL (ref 0.0–0.4)
Alpha-2-Globulin: 0.4 g/dL (ref 0.4–1.0)
Beta Globulin: 0.7 g/dL (ref 0.7–1.3)
Gamma Globulin: 2.7 g/dL — ABNORMAL HIGH (ref 0.4–1.8)
Globulin, Total: 4 g/dL — ABNORMAL HIGH (ref 2.2–3.9)
M-Spike, %: 2.3 g/dL — ABNORMAL HIGH
Total Protein ELP: 7.4 g/dL (ref 6.0–8.5)

## 2017-04-13 LAB — AFP TUMOR MARKER: AFP, Serum, Tumor Marker: 1.8 ng/mL (ref 0.0–8.3)

## 2017-04-16 ENCOUNTER — Telehealth: Payer: Self-pay | Admitting: Hematology and Oncology

## 2017-04-16 ENCOUNTER — Telehealth: Payer: Self-pay | Admitting: *Deleted

## 2017-04-16 NOTE — Telephone Encounter (Signed)
(   LABS ONLY) Per Gaspar Bidding 04/16/16 staff message Needs to have labs redrawn. Potassium was high.  Called patient and made him aware.

## 2017-04-17 ENCOUNTER — Inpatient Hospital Stay: Payer: PPO

## 2017-04-17 ENCOUNTER — Telehealth: Payer: Self-pay | Admitting: *Deleted

## 2017-04-17 DIAGNOSIS — E875 Hyperkalemia: Secondary | ICD-10-CM

## 2017-04-17 DIAGNOSIS — C9000 Multiple myeloma not having achieved remission: Secondary | ICD-10-CM

## 2017-04-17 LAB — BASIC METABOLIC PANEL
Anion gap: 5 (ref 5–15)
BUN: 32 mg/dL — ABNORMAL HIGH (ref 6–20)
CALCIUM: 9.8 mg/dL (ref 8.9–10.3)
CO2: 21 mmol/L — ABNORMAL LOW (ref 22–32)
CREATININE: 1.39 mg/dL — AB (ref 0.61–1.24)
Chloride: 112 mmol/L — ABNORMAL HIGH (ref 101–111)
GFR calc non Af Amer: 47 mL/min — ABNORMAL LOW (ref >60)
GFR, EST AFRICAN AMERICAN: 55 mL/min — AB (ref >60)
Glucose, Bld: 181 mg/dL — ABNORMAL HIGH (ref 65–99)
Potassium: 5 mmol/L (ref 3.5–5.1)
SODIUM: 138 mmol/L (ref 135–145)

## 2017-04-17 NOTE — Telephone Encounter (Signed)
Wife called and states she thinks some of the medications and supplements she has patient on for his sugars may be the blame for his increased potassium levels as several of them have a lot of potassium in them. She wanted Dr C to know this. He is coming in today for lab check. If any questions, please call her at [phone]

## 2017-04-17 NOTE — Telephone Encounter (Signed)
  We have tried to encourage no supplements for the patient.  M

## 2017-05-04 ENCOUNTER — Other Ambulatory Visit: Payer: Self-pay

## 2017-05-04 MED ORDER — LACTULOSE 10 GM/15ML PO SOLN: 20.0000 g | Freq: Four times a day (QID) | ORAL | 6 refills | Status: AC

## 2017-05-11 ENCOUNTER — Other Ambulatory Visit: Payer: Self-pay | Admitting: Internal Medicine

## 2017-05-15 ENCOUNTER — Other Ambulatory Visit: Payer: Self-pay | Admitting: Internal Medicine

## 2017-05-16 ENCOUNTER — Other Ambulatory Visit: Payer: Self-pay | Admitting: Internal Medicine

## 2017-05-17 ENCOUNTER — Other Ambulatory Visit: Payer: Self-pay | Admitting: Internal Medicine

## 2017-05-17 NOTE — Telephone Encounter (Signed)
Ok to send in script to use as needed?

## 2017-05-18 MED ORDER — TRIAMCINOLONE ACETONIDE 0.1 % EX CREA: TOPICAL_CREAM | Freq: Two times a day (BID) | CUTANEOUS | 0 refills | Status: AC

## 2017-05-18 MED ORDER — TRIAMCINOLONE ACETONIDE 0.1 % EX CREA: TOPICAL_CREAM | Freq: Two times a day (BID) | CUTANEOUS | 0 refills | Status: DC

## 2017-05-18 NOTE — Addendum Note (Signed)
Addended by: Lars Masson on: 05/18/2017 09:49 AM   Modules accepted: Orders

## 2017-05-21 ENCOUNTER — Ambulatory Visit (INDEPENDENT_AMBULATORY_CARE_PROVIDER_SITE_OTHER): Payer: PPO | Admitting: Internal Medicine

## 2017-05-21 ENCOUNTER — Encounter: Payer: Self-pay | Admitting: Internal Medicine

## 2017-05-21 DIAGNOSIS — K7469 Other cirrhosis of liver: Secondary | ICD-10-CM

## 2017-05-21 DIAGNOSIS — C9000 Multiple myeloma not having achieved remission: Secondary | ICD-10-CM

## 2017-05-21 DIAGNOSIS — I25118 Atherosclerotic heart disease of native coronary artery with other forms of angina pectoris: Secondary | ICD-10-CM

## 2017-05-21 DIAGNOSIS — D696 Thrombocytopenia, unspecified: Secondary | ICD-10-CM

## 2017-05-21 DIAGNOSIS — D500 Iron deficiency anemia secondary to blood loss (chronic): Secondary | ICD-10-CM

## 2017-05-21 DIAGNOSIS — D8989 Other specified disorders involving the immune mechanism, not elsewhere classified: Secondary | ICD-10-CM | POA: Diagnosis not present

## 2017-05-21 DIAGNOSIS — R42 Dizziness and giddiness: Secondary | ICD-10-CM | POA: Diagnosis not present

## 2017-05-21 DIAGNOSIS — I8510 Secondary esophageal varices without bleeding: Secondary | ICD-10-CM | POA: Diagnosis not present

## 2017-05-21 DIAGNOSIS — E1159 Type 2 diabetes mellitus with other circulatory complications: Secondary | ICD-10-CM

## 2017-05-21 DIAGNOSIS — E7800 Pure hypercholesterolemia, unspecified: Secondary | ICD-10-CM | POA: Diagnosis not present

## 2017-05-21 DIAGNOSIS — K766 Portal hypertension: Secondary | ICD-10-CM | POA: Diagnosis not present

## 2017-05-21 DIAGNOSIS — I10 Essential (primary) hypertension: Secondary | ICD-10-CM

## 2017-05-21 NOTE — Progress Notes (Signed)
Patient ID: Zachary Buck, male   DOB: March 25, 1940, 77 y.o.   MRN: [id number]   Subjective:    Patient ID: Zachary Buck, male    DOB: [birth year], 77 y.o.   MRN: [id number]  HPI  Patient here for a scheduled follow up.  He is accompanied by his wife.  History obtained from both of them.  He is seeing Dr Honor Junes for his diabetes.  Taking glipizide in the am and Lantus daily.  Last saw endocrinology 03/2017.  lantus adjusted.  Sugars doing better.  No low sugars.  Reports issues with tremor.  Appears to worsen when he is writing, etc.  Also intermittently notices issues with walking and his gait.  Initially reported dizziness, but on questioning - it appears to be more an issue with unsteadiness.  Notices some change with stepping up on a curb, etc.  Wife notices more issues with increased lactulose.  He is eating.  No nausea or vomiting.  No chest pain.  Breathing stable.  No abdominal pain.  Still with loose stool.  Taking lactulose.     Past Medical History:  Diagnosis Date  . Anemia   . Atherosclerotic heart disease    s/p MI, s/p stent mid circumflex  . CAD (coronary artery disease)    Dr Nehemiah Massed, cardiologist  . Cirrhosis of liver ([place])   . Clavicle fracture   . Diabetes mellitus without complication ([place])    oral med  . Elevated blood sugar    02/03/16 and 02/09/16 had BS over 400.  otherwise running around 250  . Esophageal varices ([place])   . Hepatic encephalopathy ([place])   . Hypercholesterolemia   . Hypertension    controlled on meds  . Internal hemorrhoids   . Leg fracture   . Multiple myeloma ([place])   . Myocardial infarct ([place])   . Prostate enlargement   . Tubular adenoma of colon    Past Surgical History:  Procedure Laterality Date  . COLONOSCOPY WITH PROPOFOL N/A 03/04/2015   Procedure: COLONOSCOPY WITH PROPOFOL;  Surgeon: Jerene Bears, MD;  Location: [facility];  Service: Gastroenterology;  Laterality: N/A;  . COLONOSCOPY WITH PROPOFOL N/A  12/09/2015   Procedure: COLONOSCOPY WITH PROPOFOL;  Surgeon: [name], MD;  Location: [place];  Service: Endoscopy;  Laterality: N/A;  DIABETIC-ORAL MEDS  . CORONARY ANGIOPLASTY WITH STENT PLACEMENT     2 stents  . ESOPHAGOGASTRODUODENOSCOPY (EGD) WITH PROPOFOL N/A 03/04/2015   Procedure: ESOPHAGOGASTRODUODENOSCOPY (EGD) WITH PROPOFOL;  Surgeon: Jerene Bears, MD;  Location: [facility];  Service: Gastroenterology;  Laterality: N/A;  . ESOPHAGOGASTRODUODENOSCOPY (EGD) WITH PROPOFOL N/A 12/09/2015   Procedure: ESOPHAGOGASTRODUODENOSCOPY (EGD) WITH PROPOFOL;  Surgeon: [name], MD;  Location: [place];  Service: Endoscopy;  Laterality: N/A;  . ESOPHAGOGASTRODUODENOSCOPY (EGD) WITH PROPOFOL N/A 12/21/2015   Procedure: ESOPHAGOGASTRODUODENOSCOPY (EGD) WITH PROPOFOL;  Surgeon: [name], MD;  Location: [facility];  Service: Endoscopy;  Laterality: N/A;  . ESOPHAGOGASTRODUODENOSCOPY (EGD) WITH PROPOFOL N/A 02/24/2016   Procedure: ESOPHAGOGASTRODUODENOSCOPY (EGD) WITH PROPOFOL;  Surgeon: [name], MD;  Location: [place];  Service: Endoscopy;  Laterality: N/A;  diabetic - oral med  . POLYPECTOMY N/A 12/09/2015   Procedure: POLYPECTOMY;  Surgeon: [name], MD;  Location: [place];  Service: Endoscopy;  Laterality: N/A;  . TONSILLECTOMY     Family History  Problem Relation Age of Onset  . Aneurysm Father 61       of the heart  . Heart attack Father   .  Diabetes Mother    Social History   Socioeconomic History  . Marital status: Married    Spouse name: Not on file  . Number of children: 2  . Years of education: Not on file  . Highest education level: Not on file  Occupational History  . Occupation: Engineer, building services: [employer]  Social Needs  . Financial resource strain: Not on file  . Food insecurity:    Worry: Not on file    Inability: Not on file  . Transportation needs:    Medical: Not on file    Non-medical: Not on file    Tobacco Use  . Smoking status: Never Smoker  . Smokeless tobacco: Never Used  Substance and Sexual Activity  . Alcohol use: No    Alcohol/week: 0.0 oz  . Drug use: No  . Sexual activity: Not on file  Lifestyle  . Physical activity:    Days per week: Not on file    Minutes per session: Not on file  . Stress: Not on file  Relationships  . Social connections:    Talks on phone: Not on file    Gets together: Not on file    Attends religious service: Not on file    Active member of club or organization: Not on file    Attends meetings of clubs or organizations: Not on file    Relationship status: Not on file  Other Topics Concern  . Not on file  Social History Narrative  . Not on file    Outpatient Encounter Medications as of 05/21/2017  Medication Sig  . Alpha-Lipoic Acid (LIPOIC ACID PO) Take 200 mg by mouth daily.   . Ascorbic Acid (VITAMIN C) 1000 MG tablet Take 1,000 mg by mouth daily.  . B Complex Vitamins (VITAMIN B COMPLEX) TABS Take by mouth daily.  . blood glucose meter kit and supplies KIT Dispense based on patient and insurance preference. Use up to four times daily as directed. E11.9 Please dispense Freestyle freedom meter and supplies.  . Cinnamon 500 MG capsule Take 500 mg by mouth.  . Coenzyme Q10 100 MG capsule Take 100 mg by mouth daily.   . furosemide (LASIX) 20 MG tablet Take 1 tablet (20 mg total) by mouth 2 (two) times daily.  Marland Kitchen glipiZIDE (GLUCOTROL) 5 MG tablet Take 1 tablet (5 mg total) by mouth See admin instructions.  Marland Kitchen glucose blood (FREESTYLE LITE) test strip USE TO CHECK BLOOD SUGAR THREE TIMES A DAY  . glucose blood (ONE TOUCH ULTRA TEST) test strip Use as instructed  . insulin aspart (NOVOLOG FLEXPEN) 100 UNIT/ML FlexPen Inject 0-9 Units into the skin 3 (three) times daily with meals.  . insulin glargine (LANTUS) 100 unit/mL SOPN Inject 0.1 mLs (10 Units total) into the skin at bedtime.  . Insulin Pen Needle (PEN NEEDLES) 31G X 6 MM MISC 1  application by Does not apply route 4 (four) times daily.  Marland Kitchen lactulose (CHRONULAC) 10 GM/15ML solution Take 30 mLs (20 g total) by mouth 4 (four) times daily.  . Lancets (ONETOUCH ULTRASOFT) lancets Use as instructed  . lisinopril (PRINIVIL,ZESTRIL) 20 MG tablet TAKE ONE TABLET BY MOUTH EVERY DAY/ AM  . MILK THISTLE PO Take 25 mg by mouth daily.  . Omega-3 Fatty Acids (FISH OIL) 1000 MG CAPS Take 4,000 mg by mouth daily.   . rifaximin (XIFAXAN) 550 MG TABS tablet Take 1 tablet (550 mg total) by mouth 2 (two) times daily.  . sildenafil (REVATIO) 20  MG tablet 3 to 5 tablets per day or as instructed  . spironolactone (ALDACTONE) 50 MG tablet Take 1 tablet (50 mg total) by mouth daily.  . Syringe/Needle, Disp, 30G X 1/2" 1 ML MISC 1 Syringe by Does not apply route as directed.  . Theanine 100 MG CAPS Take by mouth daily.  . traMADol (ULTRAM) 50 MG tablet Take 1 tablet (50 mg total) by mouth every 6 (six) hours as needed.  . triamcinolone cream (KENALOG) 0.1 % Apply topically 2 (two) times daily. Please avoid face and genitalia area.  Do not use in the same spot for more than 7-10 days in a row  . Turmeric POWD 4,000 mg by Does not apply route daily.  Marland Kitchen zinc sulfate 220 (50 Zn) MG capsule Take 1 capsule (220 mg total) by mouth 2 (two) times daily.   Facility-Administered Encounter Medications as of 05/21/2017  Medication  . alteplase (CATHFLO ACTIVASE) injection 2 mg  . heparin lock flush 100 unit/mL  . heparin lock flush 100 unit/mL  . sodium chloride 0.9 % injection 10 mL  . sodium chloride 0.9 % injection 3 mL    Review of Systems  Constitutional: Negative for appetite change and unexpected weight change.  HENT: Negative for congestion and sinus pressure.   Respiratory: Negative for cough, chest tightness and shortness of breath.   Cardiovascular: Negative for chest pain, palpitations and leg swelling.  Gastrointestinal: Positive for diarrhea. Negative for abdominal pain, nausea and  vomiting.  Genitourinary: Negative for difficulty urinating and dysuria.  Musculoskeletal: Negative for joint swelling and myalgias.  Skin: Negative for color change and rash.  Neurological: Negative for headaches.       Unsteadiness as outlined.  Gait change as outlined.  Some tremors.    Psychiatric/Behavioral: Negative for agitation and dysphoric mood.       Objective:     Blood pressure rechecked by me:  160/82  Physical Exam  Constitutional: He appears well-developed and well-nourished. No distress.  HENT:  Nose: Nose normal.  Mouth/Throat: Oropharynx is clear and moist.  Neck: Neck supple. No thyromegaly present.  Cardiovascular: Normal rate and regular rhythm.  Pulmonary/Chest: Effort normal and breath sounds normal. No respiratory distress.  Abdominal: Soft. Bowel sounds are normal. There is no tenderness.  Musculoskeletal: He exhibits no edema or tenderness.  Lymphadenopathy:    He has no cervical adenopathy.  Neurological:  No significant tremor noted on exam.  No shuffling of gait.  Able to turn without significant problems.    Skin: No rash noted. No erythema.  Psychiatric: He has a normal mood and affect. His behavior is normal.    BP (!) 160/82   Pulse 66   Temp 97.9 F (36.6 C) (Oral)   Resp 16   Wt 165 lb 9.6 oz (75.1 kg)   SpO2 98%   BMI 24.45 kg/m  Wt Readings from Last 3 Encounters:  05/21/17 165 lb 9.6 oz (75.1 kg)  03/01/17 162 lb 3.2 oz (73.6 kg)  01/19/17 167 lb 8 oz (76 kg)     Lab Results  Component Value Date   WBC 5.3 04/12/2017   HGB 11.1 (L) 04/12/2017   HCT 32.8 (L) 04/12/2017   PLT 72 (L) 04/12/2017   GLUCOSE 181 (H) 04/17/2017   CHOL 133 01/19/2017   TRIG 138.0 01/19/2017   HDL 21.40 (L) 01/19/2017   LDLDIRECT 56.0 12/17/2015   LDLCALC 84 01/19/2017   ALT 21 04/12/2017   AST 23 04/12/2017   NA  138 04/17/2017   K 5.0 04/17/2017   CL 112 (H) 04/17/2017   CREATININE 1.39 (H) 04/17/2017   BUN 32 (H) 04/17/2017   CO2 21 (L)  04/17/2017   TSH 4.29 01/19/2017   INR 1.12 10/01/2016   HGBA1C 8.3 (H) 01/19/2017   MICROALBUR 22.4 (H) 04/29/2015    Dg Bone Survey Met  Result Date: 03/02/2017 CLINICAL DATA:  Multiple myeloma. EXAM: METASTATIC BONE SURVEY COMPARISON:  CT 01/20/2017.  Bone survey 09/16/2015 09/16/2015. FINDINGS: No lytic or blastic abnormalities identified. No acute bony abnormality. Old left tibial fracture. Diffuse osteopenia and multifocal degenerative change. Stable mild anterolisthesis L4 on L5. Previously identified right rib fractures best identified by prior CT. No acute cardiopulmonary disease identified. No pneumothorax. Stable cardiomegaly. Aortoiliac atherosclerotic vascular calcification. Carotid vascular calcification. IMPRESSION: 1. No lytic or blastic abnormalities identified. Exam stable from prior bone survey. 2. Previous identified right rib fractures best identified by prior CT. 3.  Carotid vascular disease. Electronically Signed   By: Marcello Moores  Register   On: 03/02/2017 08:15       Assessment & Plan:   Problem List Items Addressed This Visit    CAD (coronary artery disease)    Currently asymptomatic.  Followed by cardiology.  Continue risk factor modification.        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  Taking lactulose.        Diabetes [facility])    Seeing Dr Jenetta DownerDina Rich now.  Sugars better.  On Lantus and glipizide.  Follow met b and a1c.        Dizziness    Has previously reported dizziness and reports some intermittent issues now.  After discussion, it appears he is having more issues with unsteadiness.  Has neuropathy, which could be contributing.  Also with h/o hepatic encephalopathy.  Takes lactulose.  Wife adjust doses.  Appears to vary some with this.  Some intention tremor.  Some change with his gait, especially notices with stepping up on a curb, etc.  Exam as outlined.  Given above, will refer to neurology for further evaluation.        Relevant Orders    Ambulatory referral to Neurology   Essential hypertension, benign    Blood pressure elevated.  Wife stopped his lisinopril.  Will restart.  Previously controlled.  Follow pressures.  Get him back in soon to reassess.        Hypercholesterolemia    Follow lipid panel.       Iron deficiency anemia    Continue f/u with hematology.  (IV infusions).        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        Multiple myeloma ([place])    Followed by hematology.       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  Was on nadolol.  Off now.  Has been followed by GI.        Secondary esophageal varices without bleeding ([place])    Being followed by GI.  Stable.       Thrombocytopenia ([place])    Followed by hematology.           Einar Pheasant, MD

## 2017-05-21 NOTE — Patient Instructions (Signed)
Restart lisinopril 

## 2017-05-23 ENCOUNTER — Encounter: Payer: Self-pay | Admitting: Internal Medicine

## 2017-05-23 NOTE — Assessment & Plan Note (Signed)
Being followed by GI.  Stable.  

## 2017-05-23 NOTE — Assessment & Plan Note (Signed)
Continue f/u with hematology.  (IV infusions).

## 2017-05-23 NOTE — Assessment & Plan Note (Signed)
Has previously reported dizziness and reports some intermittent issues now.  After discussion, it appears he is having more issues with unsteadiness.  Has neuropathy, which could be contributing.  Also with h/o hepatic encephalopathy.  Takes lactulose.  Wife adjust doses.  Appears to vary some with this.  Some intention tremor.  Some change with his gait, especially notices with stepping up on a curb, etc.  Exam as outlined.  Given above, will refer to neurology for further evaluation.

## 2017-05-23 NOTE — Assessment & Plan Note (Signed)
Followed by hematology 

## 2017-05-23 NOTE — Assessment & Plan Note (Signed)
Currently asymptomatic.  Followed by cardiology.  Continue risk factor modification.   

## 2017-05-23 NOTE — Assessment & Plan Note (Signed)
Blood pressure elevated.  Wife stopped his lisinopril.  Will restart.  Previously controlled.  Follow pressures.  Get him back in soon to reassess.

## 2017-05-23 NOTE — Assessment & Plan Note (Signed)
Seeing Dr Jenetta DownerDina Rich now.  Sugars better.  On Lantus and glipizide.  Follow met b and a1c.

## 2017-05-23 NOTE — Assessment & Plan Note (Signed)
Followed by GI.  Taking lactulose.

## 2017-05-23 NOTE — Assessment & Plan Note (Signed)
Follow lipid panel.   

## 2017-05-23 NOTE — Assessment & Plan Note (Signed)
Has cryptogenic cirrhosis with portal hypertension.  Was on nadolol.  Off now.  Has been followed by GI.

## 2017-05-24 ENCOUNTER — Encounter: Payer: Self-pay | Admitting: Internal Medicine

## 2017-05-24 ENCOUNTER — Encounter: Payer: Self-pay | Admitting: Gastroenterology

## 2017-05-28 ENCOUNTER — Telehealth: Payer: Self-pay

## 2017-05-28 NOTE — Telephone Encounter (Signed)
Copied from [place] [phone] [phone]. Topic: Inquiry >> May 28, 2017  1:35 PM Pricilla Handler wrote: Reason for CRM: Patient's wife called requesting to speak with Dr. Bary Leriche nurse, concerning the patient. Please call patient's wife at [phone] or [phone].        Thank You!!!

## 2017-05-28 NOTE — Telephone Encounter (Signed)
Patient stated PCP had recommended neurology referral and she ask could that be changed to [facility] does not want to go to [place].

## 2017-05-29 NOTE — Telephone Encounter (Signed)
I had placed an order for neurology referral and they were ok with [facility] at that time.  Wife called and wants to change to [facility].  Do I need to place another order?  Let me know if I need to do anything.   Thanks.

## 2017-05-29 NOTE — Telephone Encounter (Signed)
No, I have sent it to [place]

## 2017-05-30 ENCOUNTER — Inpatient Hospital Stay: Payer: PPO | Attending: Hematology and Oncology

## 2017-05-30 DIAGNOSIS — D509 Iron deficiency anemia, unspecified: Secondary | ICD-10-CM | POA: Diagnosis not present

## 2017-05-30 DIAGNOSIS — D500 Iron deficiency anemia secondary to blood loss (chronic): Secondary | ICD-10-CM

## 2017-05-30 DIAGNOSIS — D696 Thrombocytopenia, unspecified: Secondary | ICD-10-CM | POA: Diagnosis not present

## 2017-05-30 DIAGNOSIS — C9000 Multiple myeloma not having achieved remission: Secondary | ICD-10-CM

## 2017-05-30 LAB — CBC WITH DIFFERENTIAL/PLATELET
Basophils Absolute: 0.1 10*3/uL (ref 0–0.1)
Basophils Relative: 1 %
Eosinophils Absolute: 0.8 10*3/uL — ABNORMAL HIGH (ref 0–0.7)
Eosinophils Relative: 15 %
HCT: 31 % — ABNORMAL LOW (ref 40.0–52.0)
Hemoglobin: 10.6 g/dL — ABNORMAL LOW (ref 13.0–18.0)
Lymphocytes Relative: 21 %
Lymphs Abs: 1.2 10*3/uL (ref 1.0–3.6)
MCH: 29.6 pg (ref 26.0–34.0)
MCHC: 34.3 g/dL (ref 32.0–36.0)
MCV: 86.5 fL (ref 80.0–100.0)
Monocytes Absolute: 0.6 10*3/uL (ref 0.2–1.0)
Monocytes Relative: 11 %
Neutro Abs: 3 10*3/uL (ref 1.4–6.5)
Neutrophils Relative %: 52 %
Platelets: 83 10*3/uL — ABNORMAL LOW (ref 150–440)
RBC: 3.59 MIL/uL — ABNORMAL LOW (ref 4.40–5.90)
RDW: 16.4 % — ABNORMAL HIGH (ref 11.5–14.5)
WBC: 5.7 10*3/uL (ref 3.8–10.6)

## 2017-05-30 LAB — COMPREHENSIVE METABOLIC PANEL
ALT: 23 U/L (ref 17–63)
AST: 28 U/L (ref 15–41)
Albumin: 3.1 g/dL — ABNORMAL LOW (ref 3.5–5.0)
Alkaline Phosphatase: 78 U/L (ref 38–126)
Anion gap: 4 — ABNORMAL LOW (ref 5–15)
BUN: 26 mg/dL — ABNORMAL HIGH (ref 6–20)
CO2: 19 mmol/L — ABNORMAL LOW (ref 22–32)
Calcium: 9.6 mg/dL (ref 8.9–10.3)
Chloride: 113 mmol/L — ABNORMAL HIGH (ref 101–111)
Creatinine, Ser: 1.35 mg/dL — ABNORMAL HIGH (ref 0.61–1.24)
GFR calc Af Amer: 57 mL/min — ABNORMAL LOW (ref >60)
GFR calc non Af Amer: 49 mL/min — ABNORMAL LOW (ref >60)
Glucose, Bld: 127 mg/dL — ABNORMAL HIGH (ref 65–99)
Potassium: 4.8 mmol/L (ref 3.5–5.1)
Sodium: 136 mmol/L (ref 135–145)
Total Bilirubin: 1.1 mg/dL (ref 0.3–1.2)
Total Protein: 8 g/dL (ref 6.5–8.1)

## 2017-05-30 LAB — FERRITIN: Ferritin: 37 ng/mL (ref 24–336)

## 2017-05-31 ENCOUNTER — Encounter: Payer: Self-pay | Admitting: Hematology and Oncology

## 2017-05-31 ENCOUNTER — Inpatient Hospital Stay: Payer: PPO

## 2017-05-31 ENCOUNTER — Inpatient Hospital Stay (HOSPITAL_BASED_OUTPATIENT_CLINIC_OR_DEPARTMENT_OTHER): Payer: PPO | Admitting: Hematology and Oncology

## 2017-05-31 VITALS — BP 158/71 | HR 67 | Temp 97.1°F | Wt 168.0 lb

## 2017-05-31 DIAGNOSIS — D509 Iron deficiency anemia, unspecified: Secondary | ICD-10-CM | POA: Diagnosis not present

## 2017-05-31 DIAGNOSIS — D696 Thrombocytopenia, unspecified: Secondary | ICD-10-CM | POA: Diagnosis not present

## 2017-05-31 DIAGNOSIS — C9000 Multiple myeloma not having achieved remission: Secondary | ICD-10-CM

## 2017-05-31 DIAGNOSIS — D500 Iron deficiency anemia secondary to blood loss (chronic): Secondary | ICD-10-CM

## 2017-05-31 DIAGNOSIS — Z452 Encounter for adjustment and management of vascular access device: Secondary | ICD-10-CM | POA: Insufficient documentation

## 2017-05-31 DIAGNOSIS — Z7189 Other specified counseling: Secondary | ICD-10-CM

## 2017-05-31 LAB — PROTEIN ELECTROPHORESIS, SERUM
A/G Ratio: 0.7 (ref 0.7–1.7)
Albumin ELP: 3.1 g/dL (ref 2.9–4.4)
Alpha-1-Globulin: 0.2 g/dL (ref 0.0–0.4)
Alpha-2-Globulin: 0.5 g/dL (ref 0.4–1.0)
Beta Globulin: 0.8 g/dL (ref 0.7–1.3)
Gamma Globulin: 2.8 g/dL — ABNORMAL HIGH (ref 0.4–1.8)
Globulin, Total: 4.3 g/dL — ABNORMAL HIGH (ref 2.2–3.9)
M-Spike, %: 2.6 g/dL — ABNORMAL HIGH
Total Protein ELP: 7.4 g/dL (ref 6.0–8.5)

## 2017-05-31 NOTE — Progress Notes (Signed)
Patient's wife states that patient's hand were shaking, so appointment is scheduled for [place] [facility] next month.

## 2017-05-31 NOTE — Progress Notes (Signed)
[facility] day:  05/31/2017  Chief Complaint: Zachary Buck is a 77 y.o. male  with stage II multiple myeloma and iron deficiency anemia who is seen for 3 month assessment.  HPI: The patient was last seen in the medical oncology clinic on 03/01/2017.  At that time, he felt good.  He had lost weight after fracturing ribs s/p a fall from a deer stand.  He denied any episodes of altered mental status.  Hemoglobin was 11.5, hematocrit 34.3, and platelets 79,000.  Creatinine was 1.24.  Albumin 3.2.  Ferritin was145.  Bone survey on 03/01/2017 revealed no lytic or blastic abnormalities identified. Exam was stable from prior bone survey.  CBC on 04/12/2017 revealed a hematocrit of 32.8, hemoglobin 11.1, MCV 86.8, platelets 72,000, WBC 5300 with an ANC of 3400.  Creatinine was 1.38.  Calcium was 9.4.  M-spike was 2.3 on 04/12/2017.  Ferritin was 64.  CBC on 05/30/2017 revealed a hematocrit of 31.0, hemoglobin 10.6, MCV 86.5, platelets 83,000, WBC 5700.  Creatinine was 1.35.  Calcium was 9.6.  Ferritin was 37.  During the interim, he has done well.  He denies any bone pain or interval infections.  He has had some tremulous hands for which he has an appointment with Neurology in [place].  He is eating well.  Weight is up 6 pounds.   Past Medical History:  Diagnosis Date  . Anemia   . Atherosclerotic heart disease    s/p MI, s/p stent mid circumflex  . CAD (coronary artery disease)    Dr Nehemiah Massed, cardiologist  . Cirrhosis of liver ([place])   . Clavicle fracture   . Diabetes mellitus without complication ([place])    oral med  . Elevated blood sugar    02/03/16 and 02/09/16 had BS over 400.  otherwise running around 250  . Esophageal varices ([place])   . Hepatic encephalopathy ([place])   . Hypercholesterolemia   . Hypertension    controlled on meds  . Internal hemorrhoids   . Leg fracture   . Multiple myeloma ([place])   . Myocardial infarct ([place])    . Prostate enlargement   . Tubular adenoma of colon     Past Surgical History:  Procedure Laterality Date  . COLONOSCOPY WITH PROPOFOL N/A 03/04/2015   Procedure: COLONOSCOPY WITH PROPOFOL;  Surgeon: Jerene Bears, MD;  Location: [facility];  Service: Gastroenterology;  Laterality: N/A;  . COLONOSCOPY WITH PROPOFOL N/A 12/09/2015   Procedure: COLONOSCOPY WITH PROPOFOL;  Surgeon: [name], MD;  Location: [facility];  Service: Endoscopy;  Laterality: N/A;  DIABETIC-ORAL MEDS  . CORONARY ANGIOPLASTY WITH STENT PLACEMENT     2 stents  . ESOPHAGOGASTRODUODENOSCOPY (EGD) WITH PROPOFOL N/A 03/04/2015   Procedure: ESOPHAGOGASTRODUODENOSCOPY (EGD) WITH PROPOFOL;  Surgeon: Jerene Bears, MD;  Location: [facility];  Service: Gastroenterology;  Laterality: N/A;  . ESOPHAGOGASTRODUODENOSCOPY (EGD) WITH PROPOFOL N/A 12/09/2015   Procedure: ESOPHAGOGASTRODUODENOSCOPY (EGD) WITH PROPOFOL;  Surgeon: [name], MD;  Location: [facility];  Service: Endoscopy;  Laterality: N/A;  . ESOPHAGOGASTRODUODENOSCOPY (EGD) WITH PROPOFOL N/A 12/21/2015   Procedure: ESOPHAGOGASTRODUODENOSCOPY (EGD) WITH PROPOFOL;  Surgeon: [name], MD;  Location: [facility];  Service: Endoscopy;  Laterality: N/A;  . ESOPHAGOGASTRODUODENOSCOPY (EGD) WITH PROPOFOL N/A 02/24/2016   Procedure: ESOPHAGOGASTRODUODENOSCOPY (EGD) WITH PROPOFOL;  Surgeon: [name], MD;  Location: [facility];  Service: Endoscopy;  Laterality: N/A;  diabetic - oral med  . POLYPECTOMY N/A 12/09/2015   Procedure: POLYPECTOMY;  Surgeon: [name], MD;  Location: [facility];  Service: Endoscopy;  Laterality: N/A;  . TONSILLECTOMY      Family History  Problem Relation Age of Onset  . Aneurysm Father 3       of the heart  . Heart attack Father   . Diabetes Mother     Social History:  reports that he has never smoked. He has never used smokeless tobacco. He reports that he does not drink alcohol or use drugs.   Patient denies any exposure to radiation or toxins.  He is a Company secretary.  He lives in [place].  The patient is accompanied by his wife, Zachary Buck, today.  Allergies:  Allergies  Allergen Reactions  . Niaspan [Niacin Er] Other (See Comments)    Just does not want to take     Current Medications: No current facility-administered medications for this visit.    No current outpatient medications on file.   Facility-Administered Medications Ordered in Other Visits  Medication Dose Route Frequency Provider Last Rate Last Dose  . 0.9 %  sodium chloride infusion   Intravenous Once Tukov, Magadalene S, NP      . acetaminophen (TYLENOL) tablet 650 mg  650 mg Oral Q6H PRN Salary, Montell D, MD      . alteplase (CATHFLO ACTIVASE) injection 2 mg  2 mg Intracatheter Once PRN Lequita Asal, MD      . bisacodyl (DULCOLAX) EC tablet 5 mg  5 mg Oral Daily PRN Arta Silence, MD      . cefTRIAXone (ROCEPHIN) 1 g in sodium chloride 0.9 % 100 mL IVPB  1 g Intravenous Q24H Gorden Harms, MD   Stopped at 06/12/2017 1846  . dextrose 10 % infusion   Intravenous Continuous Dorene Sorrow S, NP 100 mL/hr at 05/29/2017 0130    . heparin lock flush 100 unit/mL  500 Units Intracatheter Once PRN Nolon Stalls C, MD      . heparin lock flush 100 unit/mL  250 Units Intracatheter Once PRN Lequita Asal, MD      . insulin regular (NOVOLIN R,HUMULIN R) 100 Units in sodium chloride 0.9 % 100 mL injection   Intravenous Continuous Dorene Sorrow S, NP 1 mL/hr at 06/17/2017 0130    . labetalol (NORMODYNE,TRANDATE) injection 5 mg  5 mg Intravenous Once Lafayette Dragon, MD   Stopped at 06/08/2017 1353  . lactulose (CHRONULAC) 10 GM/15ML solution 10 g  10 g Oral QID Lafayette Dragon, MD   10 g at 06/19/2017 2226  . octreotide (SANDOSTATIN) 500 mcg in sodium chloride 0.9 % 250 mL (2 mcg/mL) infusion  50 mcg/hr Intravenous Continuous Arta Silence, MD 25 mL/hr at 06/19/2017 1107 50 mcg/hr at 06/14/2017 1107   . ondansetron (ZOFRAN) tablet 4 mg  4 mg Oral Q6H PRN Arta Silence, MD       Or  . ondansetron (ZOFRAN) injection 4 mg  4 mg Intravenous Q6H PRN Arta Silence, MD   4 mg at 06/10/2017 2036  . pantoprazole (PROTONIX) injection 40 mg  40 mg Intravenous Q12H Arta Silence, MD   40 mg at 05/30/2017 2226  . rifaximin (XIFAXAN) tablet 550 mg  550 mg Oral BID Arta Silence, MD   Stopped at 06/16/2017 [phone]  . senna-docusate (Senokot-S) tablet 1 tablet  1 tablet Oral QHS PRN Arta Silence, MD      . sodium chloride 0.9 % injection 10 mL  10 mL Intracatheter PRN Lequita Asal, MD      .  sodium chloride 0.9 % injection 3 mL  3 mL Intravenous Once PRN Lequita Asal, MD        Review of Systems:  GENERAL:  Feels "ok".  No fevers or sweats.  Weight up 6 pounds. PERFORMANCE STATUS (ECOG):  1 HEENT:  No visual changes, runny nose, sore throat, mouth sores or tenderness. Lungs: No shortness of breath or cough.  No hemoptysis. Cardiac:  No chest pain, palpitations, orthopnea, or PND. GI:  No nausea, vomiting, constipation, or hematochezia.  GU:  No urgency, frequency, dysuria, or hematuria. Musculoskeletal:  No back pain.  No joint pain.  No muscle tenderness. Extremities:  No pain or swelling. Skin:  No rashes or skin changes. Neuro:  Short term memory issues.  Trouble concentrating.  Tremulous (awaiting evaluation).  No interval encephalopathy.  No headache, numbness or weakness, balance or coordination issues. Endocrine:  Diabetes (blood sugar in the 300s).  No thyroid issues, hot flashes or night sweats. Psych:  No mood changes, depression or anxiety. Pain:  No pain. Review of systems:  All other systems reviewed and found to be negative.  Physical Exam: BP (!) 158/71 (BP Location: Left Arm, Patient Position: Sitting)   Pulse 67   Temp (!) 97.1 F (36.2 C) (Tympanic)   Wt 168 lb (76.2 kg)   SpO2 97%   BMI 24.81 kg/m   GENERAL:  Well developed, well  nourished, gentleman sitting comfortably in the exam room in no acute distress. MENTAL STATUS:  Alert and oriented to person, place and time. HEAD:  Pearline Cables hair.  Normocephalic, atraumatic, face symmetric, no Cushingoid features. EYES:  Glasses.  Blue eyes.  Pupils equal round and reactive to light and accomodation.  No conjunctivitis or scleral icterus. ENT:  Oropharynx clear without lesion.  Tongue normal. Mucous membranes moist.  RESPIRATORY:  Clear to auscultation without rales, wheezes or rhonchi. CARDIOVASCULAR:  Regular rate and rhythm without murmur, rub or gallop. ABDOMEN:  Soft, non tender with active bowel sounds, and no appreciable hepatosplenomegaly.  No masses.  No guarding or rebound tenderness. SKIN:  No rashes, ulcers or lesions. EXTREMITIES: No edema, no skin discoloration or tenderness.  No palpable cords. LYMPH NODES: No palpable cervical, supraclavicular, axillary or inguinal adenopathy  NEUROLOGICAL: Unremarkable. PSYCH:  Appropriate.    Appointment on 05/30/2017  Component Date Value Ref Range Status  . Ferritin 05/30/2017 37  24 - 336 ng/mL Final   Performed at [facility], [address] [place], [place] [address]  . Total Protein ELP 05/30/2017 7.4  6.0 - 8.5 g/dL Final  . Albumin ELP 05/30/2017 3.1  2.9 - 4.4 g/dL Final  . Alpha-1-Globulin 05/30/2017 0.2  0.0 - 0.4 g/dL Final  . Alpha-2-Globulin 05/30/2017 0.5  0.4 - 1.0 g/dL Final  . Beta Globulin 05/30/2017 0.8  0.7 - 1.3 g/dL Final  . Gamma Globulin 05/30/2017 2.8* 0.4 - 1.8 g/dL Final  . M-Spike, % 05/30/2017 2.6* Not Observed g/dL Final  . SPE Interp. 05/30/2017 Comment   Final   Comment: (NOTE) The SPE pattern demonstrates a single peak (M-spike) in the gamma region which may represent monoclonal protein. This peak may also be caused by circulating immune complexes, cryoglobulins, C-reactive protein, fibrinogen or hemolysis.  If clinically indicated, the presence of a monoclonal gammopathy  may be confirmed by immuno- fixation, as well as an evaluation of the urine for the presence of Bence-Jones protein. Performed At: [facility] [place], [place] [id number] Rush Farmer MD [id number]   .  Comment 05/30/2017 Comment   Final   Comment: (NOTE) Protein electrophoresis scan will follow via computer, mail, or courier delivery.   Marland Kitchen GLOBULIN, TOTAL 05/30/2017 4.3* 2.2 - 3.9 g/dL Corrected  . A/G Ratio 05/30/2017 0.7  0.7 - 1.7 Corrected   Performed at [facility], [address], [place], [place] [address]  . Sodium 05/30/2017 136  135 - 145 mmol/L Final  . Potassium 05/30/2017 4.8  3.5 - 5.1 mmol/L Final  . Chloride 05/30/2017 113* 101 - 111 mmol/L Final  . CO2 05/30/2017 19* 22 - 32 mmol/L Final  . Glucose, Bld 05/30/2017 127* 65 - 99 mg/dL Final  . BUN 05/30/2017 26* 6 - 20 mg/dL Final  . Creatinine, Ser 05/30/2017 1.35* 0.61 - 1.24 mg/dL Final  . Calcium 05/30/2017 9.6  8.9 - 10.3 mg/dL Final  . Total Protein 05/30/2017 8.0  6.5 - 8.1 g/dL Final  . Albumin 05/30/2017 3.1* 3.5 - 5.0 g/dL Final  . AST 05/30/2017 28  15 - 41 U/L Final  . ALT 05/30/2017 23  17 - 63 U/L Final  . Alkaline Phosphatase 05/30/2017 78  38 - 126 U/L Final  . Total Bilirubin 05/30/2017 1.1  0.3 - 1.2 mg/dL Final  . GFR calc non Af Amer 05/30/2017 49* >60 mL/min Final  . GFR calc Af Amer 05/30/2017 57* >60 mL/min Final   Comment: (NOTE) The eGFR has been calculated using the CKD EPI equation. This calculation has not been validated in all clinical situations. eGFR's persistently <60 mL/min signify possible Chronic Kidney Disease.   Georgiann Hahn gap 05/30/2017 4* 5 - 15 Final   Performed at [facility], [address] [place], [place] [address]  . WBC 05/30/2017 5.7  3.8 - 10.6 K/uL Final  . RBC 05/30/2017 3.59* 4.40 - 5.90 MIL/uL Final  . Hemoglobin 05/30/2017 10.6* 13.0 - 18.0 g/dL Final  . HCT 05/30/2017 31.0* 40.0 - 52.0 % Final  . MCV 05/30/2017  86.5  80.0 - 100.0 fL Final  . MCH 05/30/2017 29.6  26.0 - 34.0 pg Final  . MCHC 05/30/2017 34.3  32.0 - 36.0 g/dL Final  . RDW 05/30/2017 16.4* 11.5 - 14.5 % Final  . Platelets 05/30/2017 83* 150 - 440 K/uL Final  . Neutrophils Relative % 05/30/2017 52  % Final  . Neutro Abs 05/30/2017 3.0  1.4 - 6.5 K/uL Final  . Lymphocytes Relative 05/30/2017 21  % Final  . Lymphs Abs 05/30/2017 1.2  1.0 - 3.6 K/uL Final  . Monocytes Relative 05/30/2017 11  % Final  . Monocytes Absolute 05/30/2017 0.6  0.2 - 1.0 K/uL Final  . Eosinophils Relative 05/30/2017 15  % Final  . Eosinophils Absolute 05/30/2017 0.8* 0 - 0.7 K/uL Final  . Basophils Relative 05/30/2017 1  % Final  . Basophils Absolute 05/30/2017 0.1  0 - 0.1 K/uL Final   Performed at [facility], [address], [place],  [address]    Assessment:  JAIYDEN LAUR is a 77 y.o. male with iron deficiency anemia and stage II multiple myeloma. 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 Serum immunoglobulins noted an IgG of 2930 (high). 24-hour urine revealed a 8.8% monoclonal protein (10.6 mg/24 hours).  Albumen was 3.3 on 08/26/2014. He had pneumonia in 05/2014.   Bone survey on 10/20/2014 revealed no lytic lesions. Bone survey on 09/16/2015 revealed no focal lytic lesion or acute bony abnormality.  SPEP has been followed:  2.3 gm/dL on 10/09/2014, 2.3 gm/dL on 01/18/2015, 2.3 gm/dL on 03/22/2015, 2.3 gm/dL on 06/18/2015, 2.3 gm/dL on 09/13/2015, 2.3 gm/dL on 11/08/2015, 2.7 gm/dL on 01/11/2016, 2.8 gm/dL on 02/17/2016, 2.4 gm/dL on 03/28/2016, 2.8 gm/dL on 04/24/2016, 2.3 gm/dL on 06/01/2016, 2.5 on 07/03/2016, 2.7 on 08/03/2016, 2.8 on 08/30/2016, 2.5 on 10/04/2016, 2.4 on 11/06/2016, 2.6 on 01/18/2017, 2.8 on 03/01/2017, and 2.3 on 04/12/2017.   Lambda free light chainswere155.3 (ratio 0.15) on 01/18/2015, 174.59 (ratio of  0.16)on 06/18/2015, 182.9 (ratio of 0.13)on 09/13/2015, 205 (ratio 0.11) on 11/08/2015, 292.7 (ratio 0.09) on 01/11/2016, and 167.0 (ratio 0.13) on 03/27/2016.  Bone marrow on 11/10/2014 revealed a 10% monoclonal plasma cell infiltrate. Marrow was hypercellular for age (40-50%) with mixed maturing hematopoiesis, relative erythroid hyperplasia and mild nonspecific dyserythropoiesis. There was patchy mild focally moderate increase in reticulin. There were no significant iron stores. Myeloma FISH panel revealed t(11;14) which results in fusion of CCND1 (BCL1) at 11q13 with the immunoglobulin heavy chain gene (IgH) at 14q32 (a favorable prognostic feature).   Bone marrow on 03/14/2016 revealed persistent monoclonal plasma cell infiltrate of 10-15%.  Marrow was hypercellular for age (40-50%) with trilineage hematopoiesis, increase eosinophils, adequate megakaryocytes and no increase in blasts. There was mild patchy increase in reticulin fibers. There was stainable iron consistent with recent parenteral iron.  Cytogenetics were normal (46, XY). FISH studies revealed CCND1/IGH translocation t(11;14).  Beta2 microglobulin was 4.2 on 11/24/2014, 3.8 on 06/18/2015, 4.5 on 09/13/2015, 4.5 on 10/18/2015, and 4.6 on 04/24/2016.  PET scan on 12/07/2014 revealed no hypermetabolic foci within the marrow space or nodal stations to suggest active multiple myeloma. There was hypermetabolism within the right lower lobe, corresponding to a similar dependent density. Morphology favored scarring.There was cirrhosis and portal hypertension. There was gastric body hypermetabolism favoring physiologic. Spleen was 15.3 cm (volume 1065 cc).  There was pulmonary artery enlargement suggesting pulmonary artery hypertension. 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  He has a history of asbestos exposure in the [employer].  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  Etiology is felt secondary to  fatty liver.  Abdominal CT scan on 03/16/2015 revealed splenomegaly (14.5 cm), cirrhosis, and stigmata of portal hypertension with esophageal and gastric varices.  There were no liver lesions.  He is on aldactone, lactulose, and Xifaxan.  Abdominal ultrasound on 10/10/2016 demonstrated nodular hepatic contour, suggesting cirrhosis.  There were no focal hepatic lesion is seen. Portal vein was patent on color Doppler imaging with normal direction of blood flow towards the liver. He has splenomegaly (16.8 x 15.3 x 8.3 cm).  Abdomen and pelvic CT on 01/20/2017 revealed acute posterior right eighth, ninth, and tenth rib fractures with small right lower lobe pulmonary contusion.  There was no acute traumatic injury to the abdomen or pelvis.  There was cirrhosis with small amount of perihepatic ascites measuring simple fluid density.  There was portal hypertension with splenomegaly (16.3 cm) and perigastric and periesophageal varices.  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  There were no pleural effusions.  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  There was no frank honeycombing. This finding accounted for the medial right lower lobe hypermetabolism on the 12/07/2014 PET-CT.  There was cirrhosis, splenomegaly, and prominent gastroesophageal varices.  He has had mild anemia since 10/2013 and  mild thrombocytopenia since 05/2014. Thrombocytopenia is felt secondary to underlying liver disease and subsequent splenomegaly.  Colonoscopy in 10/2013 revealed polyps.  Colonoscopy on 03/04/2015 revealed a sessile polyp was seen in the transverse colon (tubular adenoma).  Colonoscopy on 12/09/2015 revealed one 4 mm polyp in the ascending colon and two 4-5 mm polyps in the  sigmoid colon (hyperplastic polyps).    EGD in 10/2013 revealed gastritis. EGD on 03/04/2015 revealed medium-sized esophageal varices in the mid and distal esophagus without bleeding. There was a sessile polyp in the gastric antrum (hyperplastic polyp).  There were 3 small angiodysplastic lesions in the second part of the duodenum.  EGD on 12/09/2015 revealed grade II esophageal varices.  There was a single gastric polyp.  There was a single bleeding angioectasia in the duodenum treated with bipolar cautery and clips.  EGD on 02/24/2016 revealed no gross lesions in the esophagus. There was a single gastric polyp and a duodenal polyp. Resection was attempted. No specimens were collected  Capsule enteroscopy on 11/27/2014 revealed 1-2 gastric polyps and one colon polyp.  There were no findings to explain iron deficiency anemia.  His diet was good. Labs confirmed iron deficiency (ferritin 11.8 on 05/2014 and 13 on 10/09/2014).  B12 was 302 on 11/06/2013, 291 on 10/09/2014, 364 on 04/24/2016. MMA normal thus ruling out B12 deficiency.  He has chronic diarrhea (2 years).  Diarrhea improved on Enterogam (began 06/22/2015).  He has intermittent diarrhea.    He has recurrent iron deficiency anemia.  He has received Venofer weekly 3 (03/26/2015 - 04/14/2015), weekly x 3 (06/25/2015 - 07/09/2015), weekly x 3 (09/17/2015 - 10/01/2015), weekly x 3 (11/16/2015 - 11/30/2015), weekly x 2 (01/18/2016 - 01/25/2016),  weekly x 3 (02/18/2016 - 03/03/2016), 200 mg (05/04/2016), 600 mg (07/04/2016 - 07/18/2016), and 600 mg (02/02/2017 - 02/16/2017).    Ferritin has been followed:  23 on 06/18/2015, 121 on 07/26/2015, 17 on 09/13/2015, 18 on 11/15/2015, 52 on 12/13/2015, 10 on 01/11/2016, 88 on 02/03/2016, 23 on 02/17/2016, 46 on 03/27/2016, 20 on 04/24/2016, 31 on 06/01/2016, 16 on 07/03/2016, 119 on 08/03/2016, 55 on 08/30/2016, 53 on 10/04/2016, 35 on 11/06/2016, 54 on 12/06/2016, 21 on 01/18/2017, 145 on 02/28/2017, 64  on 04/12/2017, and 37 on 05/30/2017.  Symptomatically, He feels good.  He has lost weight after fracturing ribs s/p a fall from a deer stand.  He denies any episodes of altered mental status.  Hemoglobin is 11.5, hematocrit 34.3, and platelets 79,000.  Creatinine is 1.24.  Albumin 3.2.  Ferritin is 145.  Plan: 1.  Review labs from 05/30/2017.  Discuss slight decline in hemoglobin.  Ferritin above goal.  Discuss likely need for IV iron within 1-2 months.  2.  Review bone survey- no lytic lesions. 3.  RTC in 6 weeks for labs (CBC with diff, ferritin), +/- Venofer 4.  RTC on 08/27/2017 for MD assessment, labs (CBC with diff, CMP, SPEP, ferritin-day before), and +/- Venofer.   Lequita Asal, MD  05/31/2017, 4:35 PM

## 2017-06-02 ENCOUNTER — Encounter: Payer: Self-pay | Admitting: *Deleted

## 2017-06-02 ENCOUNTER — Other Ambulatory Visit: Payer: Self-pay

## 2017-06-02 ENCOUNTER — Inpatient Hospital Stay
Admission: EM | Admit: 2017-06-02 | Discharge: 2017-06-20 | DRG: 368 | Disposition: E | Payer: PPO | Attending: Family Medicine | Admitting: Family Medicine

## 2017-06-02 DIAGNOSIS — I8501 Esophageal varices with bleeding: Principal | ICD-10-CM | POA: Diagnosis present

## 2017-06-02 DIAGNOSIS — R570 Cardiogenic shock: Secondary | ICD-10-CM | POA: Diagnosis not present

## 2017-06-02 DIAGNOSIS — Z794 Long term (current) use of insulin: Secondary | ICD-10-CM

## 2017-06-02 DIAGNOSIS — I252 Old myocardial infarction: Secondary | ICD-10-CM

## 2017-06-02 DIAGNOSIS — N183 Chronic kidney disease, stage 3 (moderate): Secondary | ICD-10-CM | POA: Diagnosis not present

## 2017-06-02 DIAGNOSIS — E1165 Type 2 diabetes mellitus with hyperglycemia: Secondary | ICD-10-CM | POA: Diagnosis present

## 2017-06-02 DIAGNOSIS — N179 Acute kidney failure, unspecified: Secondary | ICD-10-CM | POA: Diagnosis not present

## 2017-06-02 DIAGNOSIS — E7800 Pure hypercholesterolemia, unspecified: Secondary | ICD-10-CM | POA: Diagnosis present

## 2017-06-02 DIAGNOSIS — Z888 Allergy status to other drugs, medicaments and biological substances status: Secondary | ICD-10-CM

## 2017-06-02 DIAGNOSIS — N189 Chronic kidney disease, unspecified: Secondary | ICD-10-CM | POA: Diagnosis not present

## 2017-06-02 DIAGNOSIS — K920 Hematemesis: Secondary | ICD-10-CM | POA: Diagnosis not present

## 2017-06-02 DIAGNOSIS — J984 Other disorders of lung: Secondary | ICD-10-CM | POA: Diagnosis not present

## 2017-06-02 DIAGNOSIS — J9601 Acute respiratory failure with hypoxia: Secondary | ICD-10-CM | POA: Diagnosis not present

## 2017-06-02 DIAGNOSIS — J9811 Atelectasis: Secondary | ICD-10-CM | POA: Diagnosis not present

## 2017-06-02 DIAGNOSIS — E11649 Type 2 diabetes mellitus with hypoglycemia without coma: Secondary | ICD-10-CM | POA: Diagnosis present

## 2017-06-02 DIAGNOSIS — I444 Left anterior fascicular block: Secondary | ICD-10-CM | POA: Diagnosis present

## 2017-06-02 DIAGNOSIS — E1122 Type 2 diabetes mellitus with diabetic chronic kidney disease: Secondary | ICD-10-CM | POA: Diagnosis not present

## 2017-06-02 DIAGNOSIS — I8511 Secondary esophageal varices with bleeding: Secondary | ICD-10-CM | POA: Diagnosis present

## 2017-06-02 DIAGNOSIS — D649 Anemia, unspecified: Secondary | ICD-10-CM | POA: Diagnosis not present

## 2017-06-02 DIAGNOSIS — K219 Gastro-esophageal reflux disease without esophagitis: Secondary | ICD-10-CM | POA: Diagnosis present

## 2017-06-02 DIAGNOSIS — K922 Gastrointestinal hemorrhage, unspecified: Secondary | ICD-10-CM

## 2017-06-02 DIAGNOSIS — D62 Acute posthemorrhagic anemia: Secondary | ICD-10-CM | POA: Diagnosis not present

## 2017-06-02 DIAGNOSIS — K760 Fatty (change of) liver, not elsewhere classified: Secondary | ICD-10-CM | POA: Diagnosis present

## 2017-06-02 DIAGNOSIS — Z452 Encounter for adjustment and management of vascular access device: Secondary | ICD-10-CM | POA: Diagnosis not present

## 2017-06-02 DIAGNOSIS — R34 Anuria and oliguria: Secondary | ICD-10-CM | POA: Diagnosis not present

## 2017-06-02 DIAGNOSIS — K766 Portal hypertension: Secondary | ICD-10-CM | POA: Diagnosis present

## 2017-06-02 DIAGNOSIS — E875 Hyperkalemia: Secondary | ICD-10-CM | POA: Diagnosis not present

## 2017-06-02 DIAGNOSIS — Z515 Encounter for palliative care: Secondary | ICD-10-CM | POA: Diagnosis present

## 2017-06-02 DIAGNOSIS — E872 Acidosis: Secondary | ICD-10-CM | POA: Diagnosis not present

## 2017-06-02 DIAGNOSIS — C9000 Multiple myeloma not having achieved remission: Secondary | ICD-10-CM | POA: Diagnosis present

## 2017-06-02 DIAGNOSIS — J9600 Acute respiratory failure, unspecified whether with hypoxia or hypercapnia: Secondary | ICD-10-CM | POA: Diagnosis not present

## 2017-06-02 DIAGNOSIS — D689 Coagulation defect, unspecified: Secondary | ICD-10-CM | POA: Diagnosis present

## 2017-06-02 DIAGNOSIS — B159 Hepatitis A without hepatic coma: Secondary | ICD-10-CM | POA: Diagnosis present

## 2017-06-02 DIAGNOSIS — N400 Enlarged prostate without lower urinary tract symptoms: Secondary | ICD-10-CM | POA: Diagnosis present

## 2017-06-02 DIAGNOSIS — N170 Acute kidney failure with tubular necrosis: Secondary | ICD-10-CM | POA: Diagnosis not present

## 2017-06-02 DIAGNOSIS — E785 Hyperlipidemia, unspecified: Secondary | ICD-10-CM | POA: Diagnosis present

## 2017-06-02 DIAGNOSIS — Z955 Presence of coronary angioplasty implant and graft: Secondary | ICD-10-CM | POA: Diagnosis not present

## 2017-06-02 DIAGNOSIS — R1030 Lower abdominal pain, unspecified: Secondary | ICD-10-CM | POA: Diagnosis not present

## 2017-06-02 DIAGNOSIS — J90 Pleural effusion, not elsewhere classified: Secondary | ICD-10-CM | POA: Diagnosis not present

## 2017-06-02 DIAGNOSIS — Z66 Do not resuscitate: Secondary | ICD-10-CM | POA: Diagnosis present

## 2017-06-02 DIAGNOSIS — R571 Hypovolemic shock: Secondary | ICD-10-CM | POA: Diagnosis present

## 2017-06-02 DIAGNOSIS — Z8249 Family history of ischemic heart disease and other diseases of the circulatory system: Secondary | ICD-10-CM

## 2017-06-02 DIAGNOSIS — I959 Hypotension, unspecified: Secondary | ICD-10-CM | POA: Diagnosis not present

## 2017-06-02 DIAGNOSIS — Z01818 Encounter for other preprocedural examination: Secondary | ICD-10-CM

## 2017-06-02 DIAGNOSIS — I2510 Atherosclerotic heart disease of native coronary artery without angina pectoris: Secondary | ICD-10-CM | POA: Diagnosis present

## 2017-06-02 DIAGNOSIS — K7290 Hepatic failure, unspecified without coma: Secondary | ICD-10-CM | POA: Diagnosis present

## 2017-06-02 DIAGNOSIS — D696 Thrombocytopenia, unspecified: Secondary | ICD-10-CM | POA: Diagnosis present

## 2017-06-02 DIAGNOSIS — R1013 Epigastric pain: Secondary | ICD-10-CM | POA: Diagnosis present

## 2017-06-02 DIAGNOSIS — K7469 Other cirrhosis of liver: Secondary | ICD-10-CM | POA: Diagnosis not present

## 2017-06-02 DIAGNOSIS — Z79899 Other long term (current) drug therapy: Secondary | ICD-10-CM

## 2017-06-02 DIAGNOSIS — I129 Hypertensive chronic kidney disease with stage 1 through stage 4 chronic kidney disease, or unspecified chronic kidney disease: Secondary | ICD-10-CM | POA: Diagnosis present

## 2017-06-02 DIAGNOSIS — E782 Mixed hyperlipidemia: Secondary | ICD-10-CM | POA: Diagnosis not present

## 2017-06-02 DIAGNOSIS — K744 Secondary biliary cirrhosis: Secondary | ICD-10-CM | POA: Diagnosis not present

## 2017-06-02 LAB — COMPREHENSIVE METABOLIC PANEL
ALK PHOS: 59 U/L (ref 38–126)
ALT: 18 U/L (ref 17–63)
AST: 25 U/L (ref 15–41)
Albumin: 2.3 g/dL — ABNORMAL LOW (ref 3.5–5.0)
Anion gap: 4 — ABNORMAL LOW (ref 5–15)
BUN: 30 mg/dL — AB (ref 6–20)
CALCIUM: 8.3 mg/dL — AB (ref 8.9–10.3)
CHLORIDE: 116 mmol/L — AB (ref 101–111)
CO2: 17 mmol/L — AB (ref 22–32)
CREATININE: 1.48 mg/dL — AB (ref 0.61–1.24)
GFR calc Af Amer: 51 mL/min — ABNORMAL LOW (ref >60)
GFR, EST NON AFRICAN AMERICAN: 44 mL/min — AB (ref >60)
Glucose, Bld: 303 mg/dL — ABNORMAL HIGH (ref 65–99)
Potassium: 4.8 mmol/L (ref 3.5–5.1)
SODIUM: 137 mmol/L (ref 135–145)
Total Bilirubin: 0.7 mg/dL (ref 0.3–1.2)
Total Protein: 5.9 g/dL — ABNORMAL LOW (ref 6.5–8.1)

## 2017-06-02 LAB — APTT: aPTT: 29 seconds (ref 24–36)

## 2017-06-02 LAB — CBC
HCT: 23.4 % — ABNORMAL LOW (ref 40.0–52.0)
HEMOGLOBIN: 8 g/dL — AB (ref 13.0–18.0)
MCH: 30 pg (ref 26.0–34.0)
MCHC: 34.2 g/dL (ref 32.0–36.0)
MCV: 87.9 fL (ref 80.0–100.0)
PLATELETS: 146 10*3/uL — AB (ref 150–440)
RBC: 2.66 MIL/uL — AB (ref 4.40–5.90)
RDW: 16.5 % — ABNORMAL HIGH (ref 11.5–14.5)
WBC: 11.1 10*3/uL — AB (ref 3.8–10.6)

## 2017-06-02 LAB — LIPASE, BLOOD: Lipase: 46 U/L (ref 11–51)

## 2017-06-02 LAB — PROTIME-INR
INR: 1.3
Prothrombin Time: 16.1 seconds — ABNORMAL HIGH (ref 11.4–15.2)

## 2017-06-02 MED ORDER — OCTREOTIDE LOAD VIA INFUSION
100.0000 ug | Freq: Once | INTRAVENOUS | Status: AC
  Administered 2017-06-03: 100 ug via INTRAVENOUS
  Filled 2017-06-02: qty 50

## 2017-06-02 MED ORDER — SODIUM CHLORIDE 0.9 % IV BOLUS
1000.0000 mL | Freq: Once | INTRAVENOUS | Status: AC
  Administered 2017-06-02: 1000 mL via INTRAVENOUS

## 2017-06-02 MED ORDER — SODIUM CHLORIDE 0.9 % IV SOLN
10.0000 mL/h | Freq: Once | INTRAVENOUS | Status: AC
  Administered 2017-06-03: 10 mL/h via INTRAVENOUS

## 2017-06-02 MED ORDER — SODIUM CHLORIDE 0.9 % IV SOLN
50.0000 ug/h | INTRAVENOUS | Status: DC
  Administered 2017-06-03 – 2017-06-06 (×4): 50 ug/h via INTRAVENOUS
  Filled 2017-06-02 (×15): qty 1

## 2017-06-02 NOTE — ED Provider Notes (Addendum)
[facility] Emergency Department Provider Note  ___________________________________________   First MD Initiated Contact with Patient 06/18/2017 2255     (approximate)  I have reviewed the triage vital signs and the nursing notes.   HISTORY  Chief Complaint GI Bleeding   HPI Zachary Buck is a 77 y.o. male with a history of cirrhosis and esophageal varices on lactulose who is presenting to the emergency department after vomiting approximately 500 cc of bright red blood with clots.  Patient is not on any blood thinners.  Says that he has mild abdominal cramping diffusely.  Patient reports that about 1 year ago that he had banding of his varices.  Found to be 80 systolic when EMS arrived.  Past Medical History:  Diagnosis Date  . Anemia   . Atherosclerotic heart disease    s/p MI, s/p stent mid circumflex  . CAD (coronary artery disease)    Dr Nehemiah Massed, cardiologist  . Cirrhosis of liver ([place])   . Clavicle fracture   . Diabetes mellitus without complication ([place])    oral med  . Elevated blood sugar    02/03/16 and 02/09/16 had BS over 400.  otherwise running around 250  . Esophageal varices ([place])   . Hepatic encephalopathy ([place])   . Hypercholesterolemia   . Hypertension    controlled on meds  . Internal hemorrhoids   . Leg fracture   . Multiple myeloma ([place])   . Myocardial infarct ([place])   . Prostate enlargement   . Tubular adenoma of colon     Patient Active Problem List   Diagnosis Date Noted  . Goals of care, counseling/discussion 05/31/2017  . Splenomegaly 10/11/2016  . Hepatic encephalopathy ([place]) 09/30/2016  . Fatigue 04/25/2016  . Iron deficiency anemia due to chronic blood loss   . Dizziness 02/15/2016  . Melena   . 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 Benign neoplasm of ascending colon   . Polyp of sigmoid colon   . 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 Incomplete emptying of bladder 09/01/2015   . 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 IDA (iron deficiency anemia)   . Heme positive stool   . Gastric polyp   . Angiodysplasia of duodenum   . Secondary esophageal varices without bleeding ([place])   . Portal hypertension ([place])   . Iron deficiency anemia 12/10/2014  . Abnormal PET of right lung 12/07/2014  . Multiple myeloma ([place]) 11/10/2014  . 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 Thrombocytopenia ([place]) 10/09/2014  . Awareness of heartbeats 09/18/2014  . Nasal lesion 08/26/2014  . Combined fat and carbohydrate induced hyperlipemia 06/12/2014  . Benign essential HTN 06/11/2014  . Abdominal pain 10/15/2013  . Arm mass 10/15/2013  . Mononeuropathy of upper extremity 09/27/2013  . Neuropathy, upper extremity 09/27/2013  . Elevated prostate specific antigen (PSA) 08/15/2013  . Diarrhea 07/13/2013  . D (diarrhea) 07/13/2013  . Abnormal chest CT 06/22/2013  . Bilateral arm pain 10/11/2012  . 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 Benign prostatic hyperplasia with urinary obstruction 07/23/2012  . Calculus of kidney 07/11/2012  . Calculi, ureter 07/11/2012  . Hydronephrosis 07/11/2012  . Renal colic 96/75/9163  . Diabetes ([place]) 05/29/2012  . Hypercholesterolemia 05/21/2012  . History of colonic polyps 05/21/2012  . CAD (coronary artery disease) 05/21/2012  . Anemia 05/21/2012  . Essential hypertension, benign 05/21/2012  . Absolute anemia 05/21/2012    Past Surgical  History:  Procedure Laterality Date  . COLONOSCOPY WITH PROPOFOL N/A 03/04/2015   Procedure: COLONOSCOPY WITH PROPOFOL;  Surgeon: Jerene Bears, MD;  Location: [facility];  Service: Gastroenterology;  Laterality: N/A;  . COLONOSCOPY WITH PROPOFOL N/A 12/09/2015   Procedure: COLONOSCOPY WITH PROPOFOL;  Surgeon: [name], MD;  Location: [place];  Service: Endoscopy;  Laterality: N/A;  DIABETIC-ORAL MEDS  . CORONARY ANGIOPLASTY WITH  STENT PLACEMENT     2 stents  . ESOPHAGOGASTRODUODENOSCOPY (EGD) WITH PROPOFOL N/A 03/04/2015   Procedure: ESOPHAGOGASTRODUODENOSCOPY (EGD) WITH PROPOFOL;  Surgeon: Jerene Bears, MD;  Location: [facility];  Service: Gastroenterology;  Laterality: N/A;  . ESOPHAGOGASTRODUODENOSCOPY (EGD) WITH PROPOFOL N/A 12/09/2015   Procedure: ESOPHAGOGASTRODUODENOSCOPY (EGD) WITH PROPOFOL;  Surgeon: [name], MD;  Location: [place];  Service: Endoscopy;  Laterality: N/A;  . ESOPHAGOGASTRODUODENOSCOPY (EGD) WITH PROPOFOL N/A 12/21/2015   Procedure: ESOPHAGOGASTRODUODENOSCOPY (EGD) WITH PROPOFOL;  Surgeon: [name], MD;  Location: [facility];  Service: Endoscopy;  Laterality: N/A;  . ESOPHAGOGASTRODUODENOSCOPY (EGD) WITH PROPOFOL N/A 02/24/2016   Procedure: ESOPHAGOGASTRODUODENOSCOPY (EGD) WITH PROPOFOL;  Surgeon: [name], MD;  Location: [place];  Service: Endoscopy;  Laterality: N/A;  diabetic - oral med  . POLYPECTOMY N/A 12/09/2015   Procedure: POLYPECTOMY;  Surgeon: [name], MD;  Location: [place];  Service: Endoscopy;  Laterality: N/A;  . TONSILLECTOMY      Prior to Admission medications   Medication Sig Start Date End Date Taking? Authorizing Provider  Alpha-Lipoic Acid (LIPOIC ACID PO) Take 200 mg by mouth daily.     [provider]  Ascorbic Acid (VITAMIN C) 1000 MG tablet Take 1,000 mg by mouth daily.    [provider]  B Complex Vitamins (VITAMIN B COMPLEX) TABS Take by mouth daily.    [provider]  blood glucose meter kit and supplies KIT Dispense based on patient and insurance preference. Use up to four times daily as directed. E11.9 Please dispense Freestyle freedom meter and supplies. 09/09/15   Coral Spikes, DO  Cinnamon 500 MG capsule Take 500 mg by mouth.    [provider]  Coenzyme Q10 100 MG capsule Take 100 mg by mouth daily.     [provider]  furosemide (LASIX) 20 MG tablet Take 1  tablet (20 mg total) by mouth 2 (two) times daily. 02/08/17   [name], MD  glipiZIDE (GLUCOTROL) 5 MG tablet Take 1 tablet (5 mg total) by mouth See admin instructions. 10/02/16   Einar Pheasant, MD  glucose blood (FREESTYLE LITE) test strip USE TO CHECK BLOOD SUGAR THREE TIMES A DAY 05/14/17   Einar Pheasant, MD  glucose blood (ONE TOUCH ULTRA TEST) test strip Use as instructed 11/18/15   Einar Pheasant, MD  insulin glargine (LANTUS) 100 unit/mL SOPN Inject 0.1 mLs (10 Units total) into the skin at bedtime. 11/14/16   Einar Pheasant, MD  Insulin Pen Needle (PEN NEEDLES) 31G X 6 MM MISC 1 application by Does not apply route 4 (four) times daily. 11/10/16   Einar Pheasant, MD  lactulose (CHRONULAC) 10 GM/15ML solution Take 30 mLs (20 g total) by mouth 4 (four) times daily. 05/04/17   [name], MD  Lancets Glory Rosebush ULTRASOFT) lancets Use as instructed 11/01/16   Einar Pheasant, MD  lisinopril (PRINIVIL,ZESTRIL) 20 MG tablet TAKE ONE TABLET BY MOUTH EVERY DAY/ AM 01/11/15   [provider]  MILK THISTLE PO Take 25 mg by mouth daily.    [provider]  Omega-3 Fatty Acids (FISH OIL) 1000 MG CAPS Take 4,000 mg by mouth daily.     [provider]  rifaximin (XIFAXAN) 550 MG TABS tablet Take 1 tablet (550 mg total) by mouth 2 (two) times daily. 07/31/16   [name], MD  sildenafil (REVATIO) 20 MG tablet 3 to 5 tablets per day or as instructed 09/01/15   [provider]  spironolactone (ALDACTONE) 50 MG tablet Take 1 tablet (50 mg total) by mouth daily. 02/08/17   [name], MD  Syringe/Needle, Disp, 30G X 1/2" 1 ML MISC 1 Syringe by Does not apply route as directed. 11/01/16   Einar Pheasant, MD  Theanine 100 MG CAPS Take by mouth daily.    [provider]  traMADol (ULTRAM) 50 MG tablet Take 1 tablet (50 mg total) by mouth every 6 (six) hours as needed. 01/20/17 01/20/18  Nance Pear, MD  triamcinolone cream (KENALOG) 0.1 % Apply topically 2  (two) times daily. Please avoid face and genitalia area.  Do not use in the same spot for more than 7-10 days in a row 05/18/17   Einar Pheasant, MD  Turmeric POWD 4,000 mg by Does not apply route daily.    [provider]  zinc sulfate 220 (50 Zn) MG capsule Take 1 capsule (220 mg total) by mouth 2 (two) times daily. 10/03/16   [name], MD    Allergies Niaspan [niacin er]  Family History  Problem Relation Age of Onset  . Aneurysm Father 51       of the heart  . Heart attack Father   . Diabetes Mother     Social History Social History   Tobacco Use  . Smoking status: Never Smoker  . Smokeless tobacco: Never Used  Substance Use Topics  . Alcohol use: No    Alcohol/week: 0.0 oz  . Drug use: No    Review of Systems  Constitutional: No fever/chills Eyes: No visual changes. ENT: No sore throat. Cardiovascular: Denies chest pain. Respiratory: Denies shortness of breath. Gastrointestinal: No diarrhea.  No constipation. Genitourinary: Negative for dysuria. Musculoskeletal: Negative for back pain. Skin: Negative for rash. Neurological: Negative for headaches, focal weakness or numbness.   ____________________________________________   PHYSICAL EXAM:  VITAL SIGNS: ED Triage Vitals  Enc Vitals Group     BP 05/29/2017 2249 (!) 116/55     Pulse Rate 06/05/2017 2249 86     Resp 06/05/2017 2249 (!) 22     Temp 06/16/2017 2249 (!) 97.2 F (36.2 C)     Temp Source 06/09/2017 2249 Oral     SpO2 06/01/2017 2249 97 %     Weight 05/29/2017 2250 168 lb (76.2 kg)     Height 06/19/2017 2250 5' 9"  (1.753 m)     Head Circumference --      Peak Flow --      Pain Score 05/24/2017 2250 2     Pain Loc --      Pain Edu? --      Excl. in [place]? --     Constitutional: Alert and oriented. Well appearing and in no acute distress.  Emesis bag with bright red blood at the bedside. Eyes: Conjunctivae are normal.  Head: Atraumatic. Nose: No congestion/rhinnorhea. Mouth/Throat: Mucous  membranes are moist.  Neck: No stridor.   Cardiovascular: Normal rate, regular rhythm. Grossly normal heart sounds.   Respiratory: Normal respiratory effort.  No retractions. Lungs CTAB. Gastrointestinal: Soft with mild and diffuse abdominal tenderness to palpation. No distention.  Musculoskeletal: No lower extremity tenderness nor edema.  No joint effusions. Neurologic:  Normal speech and language. No gross focal neurologic deficits are appreciated. Skin:  Skin is warm, dry and intact. No rash noted. Psychiatric: Mood and affect are normal. Speech and behavior are normal.  ____________________________________________   LABS (all labs ordered are listed, but only abnormal results are displayed)  Labs Reviewed  CBC - Abnormal; Notable for the following components:      Result Value   WBC 11.1 (*)    RBC 2.66 (*)    Hemoglobin 8.0 (*)    HCT 23.4 (*)    RDW 16.5 (*)    Platelets 146 (*)    All other components within normal limits  COMPREHENSIVE METABOLIC PANEL - Abnormal; Notable for the following components:   Chloride 116 (*)    CO2 17 (*)    Glucose, Bld 303 (*)    BUN 30 (*)    Creatinine, Ser 1.48 (*)    Calcium 8.3 (*)    Total Protein 5.9 (*)    Albumin 2.3 (*)    GFR calc non Af Amer 44 (*)    GFR calc Af Amer 51 (*)    Anion gap 4 (*)    All other components within normal limits  PROTIME-INR - Abnormal; Notable for the following components:   Prothrombin Time 16.1 (*)    All other components within normal limits  APTT  LIPASE, BLOOD  TYPE AND SCREEN  PREPARE RBC (CROSSMATCH)  ABO/RH   ____________________________________________  EKG  ED ECG REPORT I, Doran Stabler, the attending physician, personally viewed and interpreted this ECG.   Date: 06/16/2017  EKG Time: 2245  Rate: 89  Rhythm: normal sinus rhythm  Axis: Normal  Intervals:left anterior fascicular block  ST&T Change: No ST segment elevation or depression.  No abnormal T wave  inversion.  ____________________________________________  [id number]   ____________________________________________   PROCEDURES  Procedure(s) performed:   .Critical Care Performed by: Orbie Pyo, MD Authorized by: Orbie Pyo, MD   Critical care provider statement:    Critical care time (minutes):  35   Critical care was necessary to treat or prevent imminent or life-threatening deterioration of the following conditions:  Circulatory failure   Critical care was time spent personally by me on the following activities:  Examination of patient, ordering and performing treatments and interventions, ordering and review of laboratory studies, ordering and review of radiographic studies and re-evaluation of patient's condition    Critical Care performed:    ____________________________________________   INITIAL IMPRESSION / ASSESSMENT AND PLAN / ED COURSE  Pertinent labs & imaging results that were available during my care of the patient were reviewed by me and considered in my medical decision making (see chart for details).  DDX: Esophageal varices, upper GI bleeding, bleeding gastric artery, gastric ulcer, gastritis As part of my medical decision making, I reviewed the following data within the [place] Notes from prior ED visits  ----------------------------------------- 11:50 PM on 05/22/2017 -----------------------------------------  Patient to be started on an octreotide drip.  We will also transfuse 1 unit of blood that the patient has dropped about 2.6 g of hemoglobin over the past several days.  Patient and wife aware of need for admission to the hospital.  They are understanding of the plan and willing to comply.  Likely variceal bleeding. ____________________________________________   FINAL CLINICAL IMPRESSION(S) / ED DIAGNOSES   Upper GI bleeding   NEW MEDICATIONS  STARTED DURING THIS VISIT:  New Prescriptions    No medications on file     Note:  This document was prepared using Dragon voice recognition software and may include unintentional dictation errors.     Orbie Pyo, MD 06/13/2017 [place], Randall An, MD 05/23/2017 [phone]

## 2017-06-02 NOTE — ED Triage Notes (Signed)
Per EMS pt had vomited x 2 prior to arrival and then one time when they were present. Pt states he felt fine prior to episode and now he has a stomach ache but feels okay pain 2/10

## 2017-06-02 NOTE — ED Notes (Signed)
Pt with a hx of fatty liver disease and had esophageal varices that were "cut and tied" last year

## 2017-06-03 ENCOUNTER — Encounter: Payer: Self-pay | Admitting: Internal Medicine

## 2017-06-03 ENCOUNTER — Other Ambulatory Visit: Payer: Self-pay

## 2017-06-03 ENCOUNTER — Encounter: Admission: EM | Disposition: E | Payer: Self-pay | Source: Home / Self Care | Attending: Family Medicine

## 2017-06-03 ENCOUNTER — Inpatient Hospital Stay: Payer: PPO | Admitting: Anesthesiology

## 2017-06-03 DIAGNOSIS — Z66 Do not resuscitate: Secondary | ICD-10-CM | POA: Diagnosis present

## 2017-06-03 DIAGNOSIS — J9601 Acute respiratory failure with hypoxia: Secondary | ICD-10-CM | POA: Diagnosis not present

## 2017-06-03 DIAGNOSIS — D62 Acute posthemorrhagic anemia: Secondary | ICD-10-CM | POA: Diagnosis present

## 2017-06-03 DIAGNOSIS — I2510 Atherosclerotic heart disease of native coronary artery without angina pectoris: Secondary | ICD-10-CM | POA: Diagnosis present

## 2017-06-03 DIAGNOSIS — I129 Hypertensive chronic kidney disease with stage 1 through stage 4 chronic kidney disease, or unspecified chronic kidney disease: Secondary | ICD-10-CM | POA: Diagnosis present

## 2017-06-03 DIAGNOSIS — K922 Gastrointestinal hemorrhage, unspecified: Secondary | ICD-10-CM | POA: Diagnosis present

## 2017-06-03 DIAGNOSIS — K7469 Other cirrhosis of liver: Secondary | ICD-10-CM | POA: Diagnosis present

## 2017-06-03 DIAGNOSIS — I8511 Secondary esophageal varices with bleeding: Secondary | ICD-10-CM | POA: Diagnosis present

## 2017-06-03 DIAGNOSIS — E875 Hyperkalemia: Secondary | ICD-10-CM

## 2017-06-03 DIAGNOSIS — K744 Secondary biliary cirrhosis: Secondary | ICD-10-CM

## 2017-06-03 DIAGNOSIS — E872 Acidosis: Secondary | ICD-10-CM | POA: Diagnosis not present

## 2017-06-03 DIAGNOSIS — D696 Thrombocytopenia, unspecified: Secondary | ICD-10-CM | POA: Diagnosis present

## 2017-06-03 DIAGNOSIS — R570 Cardiogenic shock: Secondary | ICD-10-CM | POA: Diagnosis not present

## 2017-06-03 DIAGNOSIS — N400 Enlarged prostate without lower urinary tract symptoms: Secondary | ICD-10-CM | POA: Diagnosis present

## 2017-06-03 DIAGNOSIS — I8501 Esophageal varices with bleeding: Secondary | ICD-10-CM | POA: Diagnosis present

## 2017-06-03 DIAGNOSIS — N170 Acute kidney failure with tubular necrosis: Secondary | ICD-10-CM | POA: Diagnosis not present

## 2017-06-03 DIAGNOSIS — R571 Hypovolemic shock: Secondary | ICD-10-CM | POA: Diagnosis present

## 2017-06-03 DIAGNOSIS — K760 Fatty (change of) liver, not elsewhere classified: Secondary | ICD-10-CM | POA: Diagnosis present

## 2017-06-03 DIAGNOSIS — K920 Hematemesis: Secondary | ICD-10-CM | POA: Diagnosis not present

## 2017-06-03 DIAGNOSIS — Z955 Presence of coronary angioplasty implant and graft: Secondary | ICD-10-CM | POA: Diagnosis not present

## 2017-06-03 DIAGNOSIS — D689 Coagulation defect, unspecified: Secondary | ICD-10-CM | POA: Diagnosis present

## 2017-06-03 DIAGNOSIS — Z452 Encounter for adjustment and management of vascular access device: Secondary | ICD-10-CM | POA: Diagnosis not present

## 2017-06-03 DIAGNOSIS — E1165 Type 2 diabetes mellitus with hyperglycemia: Secondary | ICD-10-CM | POA: Diagnosis present

## 2017-06-03 DIAGNOSIS — R1013 Epigastric pain: Secondary | ICD-10-CM | POA: Diagnosis present

## 2017-06-03 DIAGNOSIS — E785 Hyperlipidemia, unspecified: Secondary | ICD-10-CM | POA: Diagnosis present

## 2017-06-03 DIAGNOSIS — C9000 Multiple myeloma not having achieved remission: Secondary | ICD-10-CM | POA: Diagnosis present

## 2017-06-03 DIAGNOSIS — K766 Portal hypertension: Secondary | ICD-10-CM | POA: Diagnosis present

## 2017-06-03 DIAGNOSIS — B159 Hepatitis A without hepatic coma: Secondary | ICD-10-CM | POA: Diagnosis present

## 2017-06-03 LAB — CBC
HCT: 18.6 % — ABNORMAL LOW (ref 40.0–52.0)
HCT: 22.6 % — ABNORMAL LOW (ref 40.0–52.0)
HEMOGLOBIN: 6.2 g/dL — AB (ref 13.0–18.0)
HEMOGLOBIN: 7.8 g/dL — AB (ref 13.0–18.0)
MCH: 29.6 pg (ref 26.0–34.0)
MCH: 29.5 pg (ref 26.0–34.0)
MCHC: 33.2 g/dL (ref 32.0–36.0)
MCHC: 34.6 g/dL (ref 32.0–36.0)
MCV: 89.1 fL (ref 80.0–100.0)
MCV: 85.3 fL (ref 80.0–100.0)
Platelets: 163 10*3/uL (ref 150–440)
Platelets: 115 10*3/uL — ABNORMAL LOW (ref 150–440)
RBC: 2.09 MIL/uL — ABNORMAL LOW (ref 4.40–5.90)
RBC: 2.65 MIL/uL — AB (ref 4.40–5.90)
RDW: 17.2 % — ABNORMAL HIGH (ref 11.5–14.5)
RDW: 17.3 % — ABNORMAL HIGH (ref 11.5–14.5)
WBC: 14.9 10*3/uL — ABNORMAL HIGH (ref 3.8–10.6)
WBC: 10.5 10*3/uL (ref 3.8–10.6)

## 2017-06-03 LAB — BASIC METABOLIC PANEL
ANION GAP: 5 (ref 5–15)
ANION GAP: 6 (ref 5–15)
Anion gap: 5 (ref 5–15)
BUN: 39 mg/dL — ABNORMAL HIGH (ref 6–20)
BUN: 46 mg/dL — ABNORMAL HIGH (ref 6–20)
BUN: 51 mg/dL — ABNORMAL HIGH (ref 6–20)
CHLORIDE: 118 mmol/L — AB (ref 101–111)
CHLORIDE: 115 mmol/L — AB (ref 101–111)
CHLORIDE: 116 mmol/L — AB (ref 101–111)
CO2: 15 mmol/L — AB (ref 22–32)
CO2: 17 mmol/L — ABNORMAL LOW (ref 22–32)
CO2: 19 mmol/L — ABNORMAL LOW (ref 22–32)
CREATININE: 1.94 mg/dL — AB (ref 0.61–1.24)
Calcium: 7.7 mg/dL — ABNORMAL LOW (ref 8.9–10.3)
Calcium: 8.4 mg/dL — ABNORMAL LOW (ref 8.9–10.3)
Calcium: 8.6 mg/dL — ABNORMAL LOW (ref 8.9–10.3)
Creatinine, Ser: 1.57 mg/dL — ABNORMAL HIGH (ref 0.61–1.24)
Creatinine, Ser: 1.79 mg/dL — ABNORMAL HIGH (ref 0.61–1.24)
GFR calc Af Amer: 47 mL/min — ABNORMAL LOW (ref >60)
GFR calc Af Amer: 37 mL/min — ABNORMAL LOW (ref >60)
GFR calc non Af Amer: 41 mL/min — ABNORMAL LOW (ref >60)
GFR calc non Af Amer: 35 mL/min — ABNORMAL LOW (ref >60)
GFR calc non Af Amer: 32 mL/min — ABNORMAL LOW (ref >60)
GFR, EST AFRICAN AMERICAN: 40 mL/min — AB (ref >60)
GLUCOSE: 328 mg/dL — AB (ref 65–99)
GLUCOSE: 87 mg/dL (ref 65–99)
Glucose, Bld: 273 mg/dL — ABNORMAL HIGH (ref 65–99)
POTASSIUM: 5.9 mmol/L — AB (ref 3.5–5.1)
Potassium: 5.8 mmol/L — ABNORMAL HIGH (ref 3.5–5.1)
Potassium: 5.1 mmol/L (ref 3.5–5.1)
SODIUM: 140 mmol/L (ref 135–145)
Sodium: 138 mmol/L (ref 135–145)
Sodium: 138 mmol/L (ref 135–145)

## 2017-06-03 LAB — COMPREHENSIVE METABOLIC PANEL
ALBUMIN: 2.2 g/dL — AB (ref 3.5–5.0)
ALT: 16 U/L — AB (ref 17–63)
AST: 25 U/L (ref 15–41)
Alkaline Phosphatase: 51 U/L (ref 38–126)
Anion gap: 4 — ABNORMAL LOW (ref 5–15)
BUN: 43 mg/dL — AB (ref 6–20)
CO2: 16 mmol/L — AB (ref 22–32)
CREATININE: 1.64 mg/dL — AB (ref 0.61–1.24)
Calcium: 8.4 mg/dL — ABNORMAL LOW (ref 8.9–10.3)
Chloride: 117 mmol/L — ABNORMAL HIGH (ref 101–111)
GFR calc Af Amer: 45 mL/min — ABNORMAL LOW (ref >60)
GFR calc non Af Amer: 39 mL/min — ABNORMAL LOW (ref >60)
GLUCOSE: 267 mg/dL — AB (ref 65–99)
Potassium: 5.9 mmol/L — ABNORMAL HIGH (ref 3.5–5.1)
SODIUM: 137 mmol/L (ref 135–145)
Total Bilirubin: 0.8 mg/dL (ref 0.3–1.2)
Total Protein: 5.5 g/dL — ABNORMAL LOW (ref 6.5–8.1)

## 2017-06-03 LAB — GLUCOSE, CAPILLARY
GLUCOSE-CAPILLARY: 296 mg/dL — AB (ref 65–99)
GLUCOSE-CAPILLARY: 236 mg/dL — AB (ref 65–99)
GLUCOSE-CAPILLARY: 189 mg/dL — AB (ref 65–99)
GLUCOSE-CAPILLARY: 141 mg/dL — AB (ref 65–99)
GLUCOSE-CAPILLARY: 119 mg/dL — AB (ref 65–99)
GLUCOSE-CAPILLARY: 86 mg/dL (ref 65–99)
GLUCOSE-CAPILLARY: 93 mg/dL (ref 65–99)
GLUCOSE-CAPILLARY: 81 mg/dL (ref 65–99)
GLUCOSE-CAPILLARY: 80 mg/dL (ref 65–99)
Glucose-Capillary: 336 mg/dL — ABNORMAL HIGH (ref 65–99)
Glucose-Capillary: 247 mg/dL — ABNORMAL HIGH (ref 65–99)
Glucose-Capillary: 255 mg/dL — ABNORMAL HIGH (ref 65–99)
Glucose-Capillary: 178 mg/dL — ABNORMAL HIGH (ref 65–99)
Glucose-Capillary: 102 mg/dL — ABNORMAL HIGH (ref 65–99)

## 2017-06-03 LAB — POTASSIUM
POTASSIUM: 5.4 mmol/L — AB (ref 3.5–5.1)
POTASSIUM: 5.7 mmol/L — AB (ref 3.5–5.1)

## 2017-06-03 LAB — AMMONIA: Ammonia: 31 umol/L (ref 9–35)

## 2017-06-03 LAB — HEMOGLOBIN
HEMOGLOBIN: 7 g/dL — AB (ref 13.0–18.0)
Hemoglobin: 7.2 g/dL — ABNORMAL LOW (ref 13.0–18.0)
Hemoglobin: 7.5 g/dL — ABNORMAL LOW (ref 13.0–18.0)

## 2017-06-03 LAB — PROTIME-INR
INR: 1.37
Prothrombin Time: 16.8 seconds — ABNORMAL HIGH (ref 11.4–15.2)

## 2017-06-03 LAB — PREPARE RBC (CROSSMATCH)

## 2017-06-03 LAB — MAGNESIUM
MAGNESIUM: 1.7 mg/dL (ref 1.7–2.4)
Magnesium: 1.2 mg/dL — ABNORMAL LOW (ref 1.7–2.4)

## 2017-06-03 LAB — PHOSPHORUS: Phosphorus: 3.9 mg/dL (ref 2.5–4.6)

## 2017-06-03 LAB — MRSA PCR SCREENING: MRSA by PCR: NEGATIVE

## 2017-06-03 LAB — ABO/RH: ABO/RH(D): A POS

## 2017-06-03 SURGERY — EGD (ESOPHAGOGASTRODUODENOSCOPY)
Anesthesia: General | Laterality: Left

## 2017-06-03 MED ORDER — SPIRONOLACTONE 25 MG PO TABS: 50.0000 mg | ORAL_TABLET | Freq: Every day | ORAL | Status: DC

## 2017-06-03 MED ORDER — SODIUM CHLORIDE 0.9 % IV SOLN
1.0000 g | Freq: Once | INTRAVENOUS | Status: AC
  Administered 2017-06-03: 1 g via INTRAVENOUS
  Filled 2017-06-03: qty 10

## 2017-06-03 MED ORDER — ONDANSETRON HCL 4 MG/2ML IJ SOLN
4.0000 mg | Freq: Four times a day (QID) | INTRAMUSCULAR | Status: DC | PRN
  Administered 2017-06-03 (×3): 4 mg via INTRAVENOUS
  Filled 2017-06-03 (×4): qty 2

## 2017-06-03 MED ORDER — DEXTROSE 5 % IV BOLUS: 250.0000 mL | Freq: Once | INTRAVENOUS | Status: DC

## 2017-06-03 MED ORDER — BISACODYL 5 MG PO TBEC: 5.0000 mg | DELAYED_RELEASE_TABLET | Freq: Every day | ORAL | Status: DC | PRN

## 2017-06-03 MED ORDER — INSULIN REGULAR HUMAN 100 UNIT/ML IJ SOLN
10.0000 [IU] | Freq: Once | INTRAMUSCULAR | Status: AC
  Administered 2017-06-03: 10 [IU] via INTRAVENOUS
  Filled 2017-06-03: qty 0.1

## 2017-06-03 MED ORDER — ACETAMINOPHEN 10 MG/ML IV SOLN
1000.0000 mg | Freq: Three times a day (TID) | INTRAVENOUS | Status: DC | PRN
  Administered 2017-06-03: 1000 mg via INTRAVENOUS
  Filled 2017-06-03: qty 100

## 2017-06-03 MED ORDER — MAGNESIUM SULFATE 2 GM/50ML IV SOLN
2.0000 g | Freq: Once | INTRAVENOUS | Status: AC
  Administered 2017-06-03: 2 g via INTRAVENOUS
  Filled 2017-06-03: qty 50

## 2017-06-03 MED ORDER — DEXTROSE 10 % IV SOLN
INTRAVENOUS | Status: DC
  Administered 2017-06-03: 15:00:00 via INTRAVENOUS

## 2017-06-03 MED ORDER — SODIUM BICARBONATE 8.4 % IV SOLN
50.0000 meq | Freq: Once | INTRAVENOUS | Status: AC
  Administered 2017-06-03: 50 meq via INTRAVENOUS
  Filled 2017-06-03: qty 50

## 2017-06-03 MED ORDER — INSULIN ASPART 100 UNIT/ML ~~LOC~~ SOLN
0.0000 [IU] | SUBCUTANEOUS | Status: DC
  Administered 2017-06-03: 8 [IU] via SUBCUTANEOUS
  Administered 2017-06-03: 5 [IU] via SUBCUTANEOUS
  Filled 2017-06-03 (×2): qty 1

## 2017-06-03 MED ORDER — LACTULOSE 10 GM/15ML PO SOLN
20.0000 g | Freq: Four times a day (QID) | ORAL | Status: DC
  Administered 2017-06-03: 10 g via ORAL
  Filled 2017-06-03: qty 30

## 2017-06-03 MED ORDER — INSULIN ASPART 100 UNIT/ML IV SOLN
10.0000 [IU] | Freq: Once | INTRAVENOUS | Status: DC
  Filled 2017-06-03: qty 0.1

## 2017-06-03 MED ORDER — SODIUM CHLORIDE 0.9 % IV SOLN
INTRAVENOUS | Status: DC
  Administered 2017-06-03: 15:00:00 via INTRAVENOUS
  Filled 2017-06-03: qty 1

## 2017-06-03 MED ORDER — SODIUM CHLORIDE 0.9 % IV SOLN
1.0000 g | INTRAVENOUS | Status: DC
  Administered 2017-06-03: 1 g via INTRAVENOUS
  Filled 2017-06-03 (×2): qty 10

## 2017-06-03 MED ORDER — DEXTROSE 50 % IV SOLN
50.0000 mL | Freq: Once | INTRAVENOUS | Status: AC
  Administered 2017-06-03: 50 mL via INTRAVENOUS
  Filled 2017-06-03: qty 50

## 2017-06-03 MED ORDER — SODIUM BICARBONATE 4.2 % IV SOLN
50.0000 meq | Freq: Once | INTRAVENOUS | Status: DC
  Filled 2017-06-03: qty 100

## 2017-06-03 MED ORDER — INSULIN ASPART 100 UNIT/ML ~~LOC~~ SOLN: 0.0000 [IU] | Freq: Three times a day (TID) | SUBCUTANEOUS | Status: DC

## 2017-06-03 MED ORDER — LABETALOL HCL 5 MG/ML IV SOLN
5.0000 mg | Freq: Once | INTRAVENOUS | Status: DC
  Filled 2017-06-03: qty 4

## 2017-06-03 MED ORDER — RIFAXIMIN 550 MG PO TABS
550.0000 mg | ORAL_TABLET | Freq: Two times a day (BID) | ORAL | Status: DC
  Filled 2017-06-03 (×2): qty 1

## 2017-06-03 MED ORDER — PANTOPRAZOLE SODIUM 40 MG IV SOLR
40.0000 mg | Freq: Two times a day (BID) | INTRAVENOUS | Status: DC
  Administered 2017-06-03 – 2017-06-06 (×8): 40 mg via INTRAVENOUS
  Filled 2017-06-03 (×8): qty 40

## 2017-06-03 MED ORDER — IPRATROPIUM-ALBUTEROL 0.5-2.5 (3) MG/3ML IN SOLN
3.0000 mL | Freq: Once | RESPIRATORY_TRACT | Status: AC
  Administered 2017-06-03: 3 mL via RESPIRATORY_TRACT
  Filled 2017-06-03: qty 3

## 2017-06-03 MED ORDER — SENNOSIDES-DOCUSATE SODIUM 8.6-50 MG PO TABS: 1.0000 | ORAL_TABLET | Freq: Every evening | ORAL | Status: DC | PRN

## 2017-06-03 MED ORDER — LACTULOSE 10 GM/15ML PO SOLN
10.0000 g | Freq: Four times a day (QID) | ORAL | Status: DC
  Administered 2017-06-03: 10 g via ORAL
  Filled 2017-06-03: qty 30

## 2017-06-03 MED ORDER — ONDANSETRON HCL 4 MG PO TABS: 4.0000 mg | ORAL_TABLET | Freq: Four times a day (QID) | ORAL | Status: DC | PRN

## 2017-06-03 MED ORDER — ACETAMINOPHEN 325 MG PO TABS: 650.0000 mg | ORAL_TABLET | Freq: Four times a day (QID) | ORAL | Status: DC | PRN

## 2017-06-03 MED ORDER — SODIUM POLYSTYRENE SULFONATE 15 GM/60ML PO SUSP: 30.0000 g | Freq: Once | ORAL | Status: DC

## 2017-06-03 MED ORDER — SODIUM CHLORIDE 0.9 % IV SOLN
1.0000 g | Freq: Once | INTRAVENOUS | Status: DC
  Filled 2017-06-03: qty 10

## 2017-06-03 NOTE — Consult Note (Signed)
Zachary Antigua, MD [address] [place], [place], [place], [address] [address], [place], [place], [place], [address] Phone: [phone]  Fax: [phone]  Consultation  Referring Provider:     Dr. Jerelyn Buck Primary Care Physician:  Zachary Pheasant, MD Reason for Consultation:     Hematemesis  Date of Admission:  06/08/2017 Date of Consultation:  06/13/2017         HPI:   Zachary Buck is a 77 y.o. male patient of, Dr. 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  Last episode of hematemesis was this morning.  Patient reports emesis of blood clots.  Also had a melanotic bowel movement yesterday.  He has been started on octreotide and Protonix twice daily.  He is hemodynamically stable.  Denies any NSAID use.  His wife is at bedside, who helps manage his care and medications.  He supposed to be on nadolol, but patient patient's wife states that he gets confused with it so she does not give that to him.  Same with the Lasix, she states she monitors his abdominal distention, and gives it to him as needed.  The wife also recently started giving him a supplementation at home that consists of habanero peppers.  Per previous notes, and talking to the patient and wife, they seem to alter her medications on their own at home.  Last EGD was by Dr. Allen Buck on January 2018 for iron deficiency anemia.  No gross lesions were noted in the lower third of the esophagus."  Single 12 mm sessile polyp in the gastric antrum was found.  One 5 mm sessile polyp in the second portion of the duodenum.  No biopsies or polypectomies done.  Prior to that, October 31st 2017 EGD was done for melena, grade 3 varices were found in the middle third and lower third of the esophagus, 5 bands were placed.  A 7 mm polyp with bleeding was found in the gastric body, hemostatic clip was placed.  12 mm polyp in the gastric antrum was  seen, with recent bleeding, clip was placed.  4 mm polyp with bleeding seen in the first portion of the duodenum, clip was placed.  He also had an EGD in December 09, 2015 for iron deficiency anemia.  Grade 2 varices were found. 5 mm polyp in the gastric antrum. 3 mm angiectasia with bleeding from the second portion of the duodenum, bicap Was done, and then 2 clips were placed.  January 2017 EGD by Dr. Hilarie Buck ENDOSCOPIC IMPRESSION: 1. 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 Sessile polyp was found in the gastric antrum; polypectomy was performed; To prevent future bleeding two hemoclips were placed on the sites 2. The stomach otherwise appeared normal 3. 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 The duodenal mucosa showed no abnormalities in the bulb and 2nd part of the duodenum and 3rd part duodenum cold forceps biopsies were taken in the bulb and second portion  1. Small Intestine Biopsy BENIGN SMALL BOWEL MUCOSA NO PATHOLOGICAL ALTERATIONS 2. Stomach, polyp(s) GASTRIC HYPERPLASTIC POLY WITH REACTIVE EPITHELIAL CHANGES NEGATIVE FOR DYSPLASIA OR MALIGNANCY  Past Medical History:  Diagnosis Date  . Anemia   . Atherosclerotic heart disease    s/p MI, s/p stent mid circumflex  . CAD (  coronary artery disease)    Dr Zachary Buck, cardiologist  . Cirrhosis of liver [facility])   . Clavicle fracture   . Diabetes mellitus without complication ([place])    oral med  . Elevated blood sugar    02/03/16 and 02/09/16 had BS over 400.  otherwise running around 250  . Esophageal varices ([place])   . Hepatic encephalopathy ([place])   . Hypercholesterolemia   . Hypertension    controlled on meds  . Internal hemorrhoids   . Leg fracture   . Multiple myeloma ([place])   . Myocardial infarct ([place])   . Prostate enlargement   . Tubular  adenoma of colon     Past Surgical History:  Procedure Laterality Date  . COLONOSCOPY WITH PROPOFOL N/A 03/04/2015   Procedure: COLONOSCOPY WITH PROPOFOL;  Surgeon: Zachary Bears, MD;  Location: [facility];  Service: Gastroenterology;  Laterality: N/A;  . COLONOSCOPY WITH PROPOFOL N/A 12/09/2015   Procedure: COLONOSCOPY WITH PROPOFOL;  Surgeon: Zachary Lame, MD;  Location: [facility];  Service: Endoscopy;  Laterality: N/A;  DIABETIC-ORAL MEDS  . CORONARY ANGIOPLASTY WITH STENT PLACEMENT     2 stents  . ESOPHAGOGASTRODUODENOSCOPY (EGD) WITH PROPOFOL N/A 03/04/2015   Procedure: ESOPHAGOGASTRODUODENOSCOPY (EGD) WITH PROPOFOL;  Surgeon: Zachary Bears, MD;  Location: [facility];  Service: Gastroenterology;  Laterality: N/A;  . ESOPHAGOGASTRODUODENOSCOPY (EGD) WITH PROPOFOL N/A 12/09/2015   Procedure: ESOPHAGOGASTRODUODENOSCOPY (EGD) WITH PROPOFOL;  Surgeon: Zachary Lame, MD;  Location: [facility];  Service: Endoscopy;  Laterality: N/A;  . ESOPHAGOGASTRODUODENOSCOPY (EGD) WITH PROPOFOL N/A 12/21/2015   Procedure: ESOPHAGOGASTRODUODENOSCOPY (EGD) WITH PROPOFOL;  Surgeon: Zachary Lame, MD;  Location: [facility];  Service: Endoscopy;  Laterality: N/A;  . ESOPHAGOGASTRODUODENOSCOPY (EGD) WITH PROPOFOL N/A 02/24/2016   Procedure: ESOPHAGOGASTRODUODENOSCOPY (EGD) WITH PROPOFOL;  Surgeon: Zachary Lame, MD;  Location: [facility];  Service: Endoscopy;  Laterality: N/A;  diabetic - oral med  . POLYPECTOMY N/A 12/09/2015   Procedure: POLYPECTOMY;  Surgeon: Zachary Lame, MD;  Location: [facility];  Service: Endoscopy;  Laterality: N/A;  . TONSILLECTOMY      Prior to Admission medications   Medication Sig Start Date End Date Taking? Authorizing Provider  Alpha-Lipoic Acid (LIPOIC ACID PO) Take 200 mg by mouth daily.    Yes [provider]  Ascorbic Acid (VITAMIN C) 1000 MG tablet Take 1,000 mg by mouth daily.   Yes [provider]  B Complex Vitamins  (VITAMIN B COMPLEX) TABS Take by mouth daily.   Yes [provider]  blood glucose meter kit and supplies KIT Dispense based on patient and insurance preference. Use up to four times daily as directed. E11.9 Please dispense Freestyle freedom meter and supplies. 09/09/15  Yes Cook, Jayce G, DO  Coenzyme Q10 100 MG capsule Take 100 mg by mouth daily.    Yes [provider]  furosemide (LASIX) 20 MG tablet Take 1 tablet (20 mg total) by mouth 2 (two) times daily. Patient taking differently: Take 20 mg by mouth as needed.  02/08/17  Yes Zachary Lame, MD  glipiZIDE (GLUCOTROL) 5 MG tablet Take 1 tablet (5 mg total) by mouth See admin instructions. 10/02/16  Yes Zachary Pheasant, MD  glucose blood (FREESTYLE LITE) test strip USE TO CHECK BLOOD SUGAR THREE TIMES A DAY 05/14/17  Yes Zachary Pheasant, MD  glucose blood (ONE TOUCH ULTRA TEST) test strip Use as instructed 11/18/15  Yes Zachary Pheasant, MD  insulin glargine (LANTUS) 100 unit/mL SOPN Inject 0.1 mLs (10 Units total) into  the skin at bedtime. Patient taking differently: Inject 16 Units into the skin at bedtime.  11/14/16  Yes Scott, Charlene, MD  Insulin Pen Needle (PEN NEEDLES) 31G X 6 MM MISC 1 application by Does not apply route 4 (four) times daily. 11/10/16  Yes Scott, Charlene, MD  lactulose (CHRONULAC) 10 GM/15ML solution Take 30 mLs (20 g total) by mouth 4 (four) times daily. 05/04/17  Yes Wohl, Darren, MD  Lancets (ONETOUCH ULTRASOFT) lancets Use as instructed 11/01/16  Yes Scott, Charlene, MD  lisinopril (PRINIVIL,ZESTRIL) 20 MG tablet TAKE ONE TABLET BY MOUTH EVERY DAY/ AM 01/11/15  Yes [provider]  MILK THISTLE PO Take 25 mg by mouth daily.   Yes [provider]  NON FORMULARY Take 1 tablet by mouth daily. burburan   Yes [provider]  Omega-3 Fatty Acids (FISH OIL) 1000 MG CAPS Take 4,000 mg by mouth daily.    Yes [provider]  rifaximin (XIFAXAN) 550 MG TABS tablet Take 1 tablet  (550 mg total) by mouth 2 (two) times daily. 07/31/16  Yes Wohl, Darren, MD  Syringe/Needle, Disp, 30G X 1/2" 1 ML MISC 1 Syringe by Does not apply route as directed. 11/01/16  Yes Scott, Charlene, MD  Theanine 100 MG CAPS Take by mouth daily.   Yes [provider]  triamcinolone cream (KENALOG) 0.1 % Apply topically 2 (two) times daily. Please avoid face and genitalia area.  Do not use in the same spot for more than 7-10 days in a row 05/18/17  Yes Scott, Charlene, MD  Turmeric POWD 4,000 mg by Does not apply route daily.   Yes [provider]  Cinnamon 500 MG capsule Take 500 mg by mouth.    [provider]  sildenafil (REVATIO) 20 MG tablet 3 to 5 tablets per day or as instructed 09/01/15   [provider]  spironolactone (ALDACTONE) 50 MG tablet Take 1 tablet (50 mg total) by mouth daily. Patient not taking: Reported on 06/08/2017 02/08/17   Wohl, Darren, MD  traMADol (ULTRAM) 50 MG tablet Take 1 tablet (50 mg total) by mouth every 6 (six) hours as needed. Patient not taking: Reported on 06/14/2017 01/20/17 01/20/18  Goodman, Graydon, MD  zinc sulfate 220 (50 Zn) MG capsule Take 1 capsule (220 mg total) by mouth 2 (two) times daily. Patient not taking: Reported on 06/01/2017 10/03/16   Gouru, Aruna, MD    Family History  Problem Relation Age of Onset  . Aneurysm Father 61       of the heart  . Heart attack Father   . Diabetes Mother      Social History   Tobacco Use  . Smoking status: Never Smoker  . Smokeless tobacco: Never Used  Substance Use Topics  . Alcohol use: No    Alcohol/week: 0.0 oz  . Drug use: No    Allergies as of 06/01/2017 - Review Complete 05/31/2017  Allergen Reaction Noted  . Niaspan [niacin er] Other (See Comments) 03/19/2012    Review of Systems:    All systems reviewed and negative except where noted in HPI.   Physical Exam:  Vital signs in last 24 hours: Vitals:   06/11/2017 0500 05/22/2017 0600 06/14/2017 0700 05/27/2017 0739    BP: (!) 145/77 (!) 140/101 (!) 168/73   Pulse: 94 75 79   Resp: 19 14 14   Temp: 97.6 F (36.4 C)   97.6 F (36.4 C)  TempSrc: Oral   Axillary  SpO2: 94% 96%   97%   Weight:      Height:       Last BM Date: 05/30/2017 General:   Pleasant, cooperative in NAD Head:  Normocephalic and atraumatic. Eyes:   No icterus.   Conjunctiva pink. PERRLA. Ears:  Normal auditory acuity. Neck:  Supple; no masses or thyroidomegaly Lungs: Respirations even and unlabored. Lungs clear to auscultation bilaterally.   No wheezes, crackles, or rhonchi.  Abdomen:  Soft, nondistended, nontender. Normal bowel sounds. No appreciable masses or hepatomegaly.  No rebound or guarding.  Neurologic:  Alert and oriented x3;  grossly normal neurologically. Skin:  Intact without significant lesions or rashes. Cervical Nodes:  No significant cervical adenopathy. Psych:  Alert and cooperative. Normal affect.  LAB RESULTS: Recent Labs    05/21/2017 2244 06/07/2017 0226 05/27/2017 0734  WBC 11.1* 14.9* 10.5  HGB 8.0* 6.2* 7.8*  HCT 23.4* 18.6* 22.6*  PLT 146* 163 115*   BMET Recent Labs    05/22/2017 2244 05/24/2017 0226 06/19/2017 0734  NA 137 138 137  K 4.8 5.8* 5.9*  CL 116* 118* 117*  CO2 17* 15* 16*  GLUCOSE 303* 328* 267*  BUN 30* 39* 43*  CREATININE 1.48* 1.57* 1.64*  CALCIUM 8.3* 7.7* 8.4*   LFT Recent Labs    05/23/2017 0734  PROT 5.5*  ALBUMIN 2.2*  AST 25  ALT 16*  ALKPHOS 51  BILITOT 0.8   PT/INR Recent Labs    06/19/2017 2244 06/10/2017 0734  LABPROT 16.1* 16.8*  INR 1.30 1.37    STUDIES: No results found.    Impression / Plan:   Levelle E Prickett is a 76 y.o. y/o male with History of cirrhosis from nonalcoholic fatty liver disease, follows Dr. Wohl in clinic, presents with hematemesis, with history of AVMs and varices, last banded in October 2017, with EGD in January 2018 not showing any varices  Continue octreotide drip for possible variceal bleeding Continue Protonix IV twice  daily for possible peptic ulcer disease Continue serial CBCs and transfuse PRN Avoid NSAIDs Patient has been discussed with anesthesia, and they would like the hyperkalemia corrected prior to proceeding to endoscopy Once this is corrected, can proceed with EGD Patient will ned to be intubated for the procedure for airway protection, due to his hematemesis.  He is agreeable with this  I have discussed alternative options, risks & benefits,  which include, but are not limited to, bleeding, infection, perforation,respiratory complication & drug reaction.  The patient agrees with this plan & written consent will be obtained.     Thank you for involving me in the care of this patient.      LOS: 0 days   Varnita B Tahiliani, MD  06/05/2017, 11:35 AM     

## 2017-06-03 NOTE — Progress Notes (Signed)
Pt's serum K remains at 5.9. Dr. Bonna Gains and Dr. Randa Lynn paged, awaiting call back. Pt's BP is trending up and is now 150-160s/70-80s. Dr. Celesta Aver notified of serum K and elevated BP. Verbal order to consult pharmacy for insulin drip (Dr. Celesta Aver stated to initiate insulin at 3units/hour) with IV dextrose to treat hyperkalemia. In addition, Dr. Celesta Aver stated to administer 5mg  of labetalol once for elevated BP.

## 2017-06-03 NOTE — Progress Notes (Signed)
Pt's CBG has dropped to 102 since his rate of D10 was increased to 61ml/hr. Dr. Celesta Aver notified and she stated to decrease insulin drip to 1unit/hr and to have lab collect potassium level, hematocrit, and ammonia level now to assess if insulin drip can be discontinued. Orders updated in epic and phlebotomy notified.

## 2017-06-03 NOTE — Plan of Care (Signed)
Patient receiving blood  

## 2017-06-03 NOTE — Progress Notes (Addendum)
Pt's serum K is now 5.4. Dr. Bonna Gains paged and Dr. Celesta Aver notified. Per Dr. Celesta Aver, we are to keep pt on insulin drip and check his serum K every 4 hours. Per Dr. Bonna Gains, serum K is still too high for anesthesia to be comfortable with performing necessary sedation and intubation during EGD. They will attempt EGD tomorrow, but if he becomes hemodynamically unstable or begins having bowel movements/vomiting copious blood, Dr. Bonna Gains is to be paged to perform emergent EGD. She stated that as long as he does not feel nauseated/does not vomit he can take his PO medications as ordered with small sips of water. In addition, family was asking if Dr. Allen Norris can be notified of pt's admission and if he could round on pt tomorrow. Dr. Bonna Gains stated that she would notify Dr. Allen Norris of pt's current admission, but is not sure if he will be able to round on pt tomorrow. Family notified of the above.

## 2017-06-03 NOTE — ED Notes (Signed)
700cc bright red blood vomited

## 2017-06-03 NOTE — Progress Notes (Addendum)
Pharmacy Electrolyte Monitoring Consult:  Pharmacy consulted to assist in monitoring and replacing electrolytes in this 77 y.o. male admitted on 06/11/2017 with GI Bleeding   Labs:  Sodium (mmol/L)  Date Value  06/12/2017 138   Potassium (mmol/L)  Date Value  06/14/2017 5.7 (H)   Magnesium (mg/dL)  Date Value  06/10/2017 1.7   Phosphorus (mg/dL)  Date Value  06/17/2017 3.9   Calcium (mg/dL)  Date Value  06/16/2017 8.4 (L)   Albumin (g/dL)  Date Value  06/11/2017 2.2 (L)  11/18/2013 3.3 (L)    Plan: Pharmacy to start insulin drip at 3 units/hr and dextrose infusion for hyperkalemia.  K 5.9; Will follow K q4h.   Pharmacy to continue to follow and monitor electrolytes per protocol.   4/14:  Glucose @ 2100 = 93 Will increase D10 infusion to 60 ml/hr.   K = 5.7 @ 19:00.   RN, MD aware of elevated K , will increase insulin drip but hold off on Kayexelate for now.    Ronak Duquette D, PharmD 06/19/2017 9:43 PM

## 2017-06-03 NOTE — Progress Notes (Signed)
I spoke to Dr. Celesta Aver, Dr. Bonna Gains, and Dr. Randa Lynn regarding pt's BMP. Per Dr. Bonna Gains, electrolytes should be addressed prior to EGD that is scheduled for today. Dr. Celesta Aver ordered medications to be administered to address the electrolytes. Dr. Randa Lynn asked that bmp and magnesium level be rechecked ~30 minutes after medications were administered. Orders added to epic.

## 2017-06-03 NOTE — Consult Note (Signed)
PULMONARY / CRITICAL CARE MEDICINE   Name: AZION CENTRELLA MRN: [id number] DOB: 01/08/1941    ADMISSION DATE:  06/16/2017   CONSULTATION DATE:  06/04/2014  REFERRING MD:  Dr. 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  When EMS arrived, patient was hypotensive.  Upon arrival in the ED, he was hypotensive and his workup showed a hemoglobin of 8.0.  Patient states that he vomited about 500 cc with blood clots.  He was admitted to 2A but upon arrival and on the floor, he said he vomited about 700 cc of bright red blood, became hypotensive and had a near syncopal episode.  Rapid response was called and he was transferred to the ICU for further management.  His repeat CBC showed a hemoglobin of 6.2.  He is currently receiving a unit of packed red blood cells.  He is complaining of abdominal cramping and mild nausea.  Denies diarrhea and bloody stools  PAST MEDICAL HISTORY :  He  has a past medical history of Anemia, Atherosclerotic heart disease, CAD (coronary artery disease), Cirrhosis of liver ([place]), Clavicle fracture, Diabetes mellitus without complication ([place]), Elevated blood sugar, Esophageal varices (HCC), Hepatic encephalopathy ([place]), Hypercholesterolemia, Hypertension, Internal hemorrhoids, Leg fracture, Multiple myeloma ([place]), Myocardial infarct [facility]), Prostate enlargement, and Tubular adenoma of colon.  PAST SURGICAL HISTORY: He  has a past surgical history that includes Tonsillectomy; Esophagogastroduodenoscopy (egd) with propofol (N/A, 03/04/2015); Colonoscopy with propofol (N/A, 03/04/2015); Colonoscopy with propofol (N/A, 12/09/2015); Esophagogastroduodenoscopy (egd) with propofol (N/A, 12/09/2015);  polypectomy (N/A, 12/09/2015); Esophagogastroduodenoscopy (egd) with propofol (N/A, 12/21/2015); Esophagogastroduodenoscopy (egd) with propofol (N/A, 02/24/2016); and Coronary angioplasty with stent.  Allergies  Allergen Reactions  . Niaspan [Niacin Er] Other (See Comments)    Just does not want to take     Current Facility-Administered Medications on File Prior to Encounter  Medication  . alteplase (CATHFLO ACTIVASE) injection 2 mg  . heparin lock flush 100 unit/mL  . heparin lock flush 100 unit/mL  . sodium chloride 0.9 % injection 10 mL  . sodium chloride 0.9 % injection 3 mL   Current Outpatient Medications on File Prior to Encounter  Medication Sig  . Alpha-Lipoic Acid (LIPOIC ACID PO) Take 200 mg by mouth daily.   . Ascorbic Acid (VITAMIN C) 1000 MG tablet Take 1,000 mg by mouth daily.  . B Complex Vitamins (VITAMIN B COMPLEX) TABS Take by mouth daily.  . blood glucose meter kit and supplies KIT Dispense based on patient and insurance preference. Use up to four times daily as directed. E11.9 Please dispense Freestyle freedom meter and supplies.  . Coenzyme Q10 100 MG capsule Take 100 mg by mouth daily.   . furosemide (LASIX) 20 MG tablet Take 1 tablet (20 mg total) by mouth 2 (two) times daily. (Patient taking differently: Take 20 mg by mouth as needed. )  . glipiZIDE (GLUCOTROL) 5 MG tablet Take 1 tablet (5 mg total) by mouth See admin instructions.  Marland Kitchen glucose blood (FREESTYLE LITE) test strip USE TO CHECK BLOOD SUGAR THREE TIMES A DAY  . glucose blood (ONE TOUCH ULTRA TEST) test strip Use as instructed  . insulin glargine (LANTUS) 100 unit/mL SOPN Inject 0.1 mLs (10 Units total) into the skin at bedtime. (Patient taking differently:  Inject 16 Units into the skin at bedtime. )  . Insulin Pen Needle (PEN NEEDLES) 31G X 6 MM MISC 1 application by Does not apply route 4 (four) times daily.  Marland Kitchen lactulose (CHRONULAC) 10 GM/15ML solution Take 30 mLs (20 g total) by mouth 4 (four) times  daily.  . Lancets (ONETOUCH ULTRASOFT) lancets Use as instructed  . lisinopril (PRINIVIL,ZESTRIL) 20 MG tablet TAKE ONE TABLET BY MOUTH EVERY DAY/ AM  . MILK THISTLE PO Take 25 mg by mouth daily.  . NON FORMULARY Take 1 tablet by mouth daily. burburan  . Omega-3 Fatty Acids (FISH OIL) 1000 MG CAPS Take 4,000 mg by mouth daily.   . rifaximin (XIFAXAN) 550 MG TABS tablet Take 1 tablet (550 mg total) by mouth 2 (two) times daily.  . Syringe/Needle, Disp, 30G X 1/2" 1 ML MISC 1 Syringe by Does not apply route as directed.  . Theanine 100 MG CAPS Take by mouth daily.  Marland Kitchen triamcinolone cream (KENALOG) 0.1 % Apply topically 2 (two) times daily. Please avoid face and genitalia area.  Do not use in the same spot for more than 7-10 days in a row  . Turmeric POWD 4,000 mg by Does not apply route daily.  . Cinnamon 500 MG capsule Take 500 mg by mouth.  . sildenafil (REVATIO) 20 MG tablet 3 to 5 tablets per day or as instructed  . spironolactone (ALDACTONE) 50 MG tablet Take 1 tablet (50 mg total) by mouth daily. (Patient not taking: Reported on 06/13/2017)  . traMADol (ULTRAM) 50 MG tablet Take 1 tablet (50 mg total) by mouth every 6 (six) hours as needed. (Patient not taking: Reported on 05/27/2017)  . zinc sulfate 220 (50 Zn) MG capsule Take 1 capsule (220 mg total) by mouth 2 (two) times daily. (Patient not taking: Reported on 06/16/2017)    FAMILY HISTORY:  His indicated that his mother is deceased. He indicated that his father is deceased.   SOCIAL HISTORY: He  reports that he has never smoked. He has never used smokeless tobacco. He reports that he does not drink alcohol or use drugs.  REVIEW OF SYSTEMS:   Constitutional: Negative for fever and chills.  HENT: Negative for congestion and rhinorrhea.  Eyes: Negative for redness and visual disturbance.  Respiratory: Negative for shortness of breath and wheezing.  Cardiovascular: Negative for chest pain and palpitations.  Gastrointestinal:  Positive for hematemesis, nausea, vomiting and abdominal pain Genitourinary: Negative for dysuria and urgency.  Endocrine: Denies polyuria, polyphagia and heat intolerance Musculoskeletal: Negative for myalgias and arthralgias.  Skin: Negative for pallor and wound.  Neurological: Positive for near syncope and dizziness  SUBJECTIVE:   VITAL SIGNS: BP (!) 133/55   Pulse 87   Temp (!) 97.5 F (36.4 C)   Resp 20   Ht 5' 9"  (1.753 m)   Wt 164 lb 7.4 oz (74.6 kg)   SpO2 98%   BMI 24.29 kg/m   HEMODYNAMICS:    VENTILATOR SETTINGS:    INTAKE / OUTPUT: No intake/output data recorded.  PHYSICAL EXAMINATION: General: Well-groomed, well-nourished, pleasant, in no acute distress Neuro: Alert and oriented x4, cranial nerves intact HEENT: PERRLA, no JVD Cardiovascular: Apical pulse regular, S1-S2, no murmur regurg or gallop, +2 pulses, no edema Lungs: Bilateral breath sounds without wheezes or rhonchi Abdomen: Nondistended, hypoactive bowel sounds in all 4 quadrants, palpation reveals epigastric tenderness and right upper quadrant tenderness, no referred rebound tenderness Musculoskeletal: Positive range of motion in upper and lower extremities, no joint  deformities Skin: Pale, warm and dry  LABS:  BMET Recent Labs  Lab 05/30/17 1120 06/18/2017 2244 06/01/2017 0226  NA 136 137 138  K 4.8 4.8 5.8*  CL 113* 116* 118*  CO2 19* 17* 15*  BUN 26* 30* 39*  CREATININE 1.35* 1.48* 1.57*  GLUCOSE 127* 303* 328*    Electrolytes Recent Labs  Lab 05/30/17 1120 05/29/2017 2244 06/05/2017 0226  CALCIUM 9.6 8.3* 7.7*    CBC Recent Labs  Lab 05/30/17 1120 06/10/2017 2244 05/22/2017 0226  WBC 5.7 11.1* 14.9*  HGB 10.6* 8.0* 6.2*  HCT 31.0* 23.4* 18.6*  PLT 83* 146* 163    Coag's Recent Labs  Lab 06/01/2017 2244  APTT 29  INR 1.30    Sepsis Markers No results for input(s): LATICACIDVEN, PROCALCITON, O2SATVEN in the last 168 hours.  ABG No results for input(s): PHART,  PCO2ART, PO2ART in the last 168 hours.  Liver Enzymes Recent Labs  Lab 05/30/17 1120 06/11/2017 2244  AST 28 25  ALT 23 18  ALKPHOS 78 59  BILITOT 1.1 0.7  ALBUMIN 3.1* 2.3*    Cardiac Enzymes No results for input(s): TROPONINI, PROBNP in the last 168 hours.  Glucose Recent Labs  Lab 06/01/2017 0159 06/17/2017 0304  GLUCAP 336* 296*    Imaging No results found.   STUDIES:  Due for EGD today  CULTURES: None  ANTIBIOTICS: Ceftriaxone  SIGNIFICANT EVENTS: 4/13: Admitted with GI bleed  LINES/TUBES: Peripheral IV  DISCUSSION: 77 year old with a history of esophageal varices, liver cirrhosis and hepatic encephalopathy presenting with a recurrent GI bleed likely secondary to esophageal varices, near syncope and hypovolemic shock.  ASSESSMENT  Acute upper GI bleed Near syncope Hypovolemic shock Acute blood loss anemia Hepatic encephalopathy Cryptogenic liver cirrhosis Type 2 diabetes mellitus  PLAN Hemodynamic monitoring per ICU protocol Octreotide and Protonix per GI IV fluids Transfuse packed red blood cells as ordered Trend CBC and transfuse for hemoglobin less than 7 Plan for endoscopy today per GI Blood glucose monitoring with sliding scale insulin coverage Keep n.p.o. Bedrest No pharmacologic  DVT prophylaxis SCDs for DVT prophylaxis Further changes in treatment plan pending clinical course and diagnostics  FAMILY  - Updates: Spouse and children updated at bedside.  All questions answered  - Inter-disciplinary family meet or Palliative Care meeting due by: ay Coburn. [facility] ANP-BC Pulmonary and Critical Care Medicine [facility] Pager [phone] or [phone]  NB: This document was prepared using Dragon voice recognition software and may include unintentional dictation errors.   06/15/2017, 3:56 AM

## 2017-06-03 NOTE — ED Notes (Signed)
Up to [facility], for BM. Nauseated no emesis

## 2017-06-03 NOTE — H&P (Signed)
[place] [facility] [place] NAME: Zachary Buck    MR#:  [id number]  DATE OF BIRTH:  1940-12-26  DATE OF ADMISSION:  05/23/2017  PRIMARY CARE PHYSICIAN: Einar Pheasant, MD   REQUESTING/REFERRING PHYSICIAN: Orbie Pyo, MD  CHIEF COMPLAINT:   Chief Complaint  Patient presents with  . GI Bleeding    HISTORY OF PRESENT ILLNESS:  Zachary Buck  is a 77 y.o. male with a known history of T2IDDM, HTN, HLD, CAD/MI (s/p LCx stent 2006), multiple myeloma (stage II), IDA, NAFLD/cirrhosis (w/ hx hepatic enceph, portal HTN), GERD/gastritis, nephrolithiasis who p/w 1d Hx hematemesis. Pt's outpt Gastroenterologist is Dr. Allen Norris. His last EGD was on 02/24/2016, (-) esophageal varices. Pt states that he was feeling fine until the evening of Saturday 06/08/2017, at which time he developed diffuse AP/discomfort and nausea. He had just taken his Lactulose a short time prior, and sat on the toilet to have a bowel movement. He felt nauseous, and vomiting once into the garbage can near the toilet. His wife states he vomited ~1/2 pint red blood + clots. He was able to get off the toilet without issue, and got dressed, but his wife states he was too weak to get into the car, and she was unable to help him adequately. As such, EMS was contacted. The pt had a second episode of hematemesis (w/ red blood + clots) after EMS arrival, prior to transport. Upon my initial assessment, the pt was well-appearing, and did not have any active hematemesis. However, shortly after my assessment, the pt had a repeat episode of hematemesis, ~700cc bright red blood, followed by a syncopal episode. His BP dropped to 80s/40s. Hgb on admission was 8.0, however, given the pt's clinical condition, 1u pRBCs was transfused. Repeat Hgb is pending. ICU contacted. Case also discussed with Dr. Bonna Gains (GI on call, partner to Dr. Allen Norris), who recommended continuing Octreotide, Protonix,  Ceftriaxone initiated in ED, and advised low threshold for intubation (if continued hematemesis), with likely plan to perform EGD on 06/16/2017 AM. Pt endorses generalized weakness, but denies F/C, diarrhea, CP/SOB, palpitations, diaphoresis, night sweats, rigors, HA, blurred vision, LH/LOC, urinary symptoms.  PAST MEDICAL HISTORY:   Past Medical History:  Diagnosis Date  . Anemia   . Atherosclerotic heart disease    s/p MI, s/p stent mid circumflex  . CAD (coronary artery disease)    Dr Nehemiah Massed, cardiologist  . Cirrhosis of liver ([place])   . Clavicle fracture   . Diabetes mellitus without complication ([place])    oral med  . Elevated blood sugar    02/03/16 and 02/09/16 had BS over 400.  otherwise running around 250  . Esophageal varices ([place])   . Hepatic encephalopathy ([place])   . Hypercholesterolemia   . Hypertension    controlled on meds  . Internal hemorrhoids   . Leg fracture   . Multiple myeloma ([place])   . Myocardial infarct ([place])   . Prostate enlargement   . Tubular adenoma of colon     PAST SURGICAL HISTORY:   Past Surgical History:  Procedure Laterality Date  . COLONOSCOPY WITH PROPOFOL N/A 03/04/2015   Procedure: COLONOSCOPY WITH PROPOFOL;  Surgeon: Jerene Bears, MD;  Location: [facility];  Service: Gastroenterology;  Laterality: N/A;  . COLONOSCOPY WITH PROPOFOL N/A 12/09/2015   Procedure: COLONOSCOPY WITH PROPOFOL;  Surgeon: [name], MD;  Location: [place];  Service: Endoscopy;  Laterality: N/A;  DIABETIC-ORAL MEDS  . CORONARY ANGIOPLASTY WITH  STENT PLACEMENT     2 stents  . ESOPHAGOGASTRODUODENOSCOPY (EGD) WITH PROPOFOL N/A 03/04/2015   Procedure: ESOPHAGOGASTRODUODENOSCOPY (EGD) WITH PROPOFOL;  Surgeon: Jerene Bears, MD;  Location: [facility];  Service: Gastroenterology;  Laterality: N/A;  . ESOPHAGOGASTRODUODENOSCOPY (EGD) WITH PROPOFOL N/A 12/09/2015   Procedure: ESOPHAGOGASTRODUODENOSCOPY (EGD) WITH PROPOFOL;  Surgeon: [name], MD;   Location: [place];  Service: Endoscopy;  Laterality: N/A;  . ESOPHAGOGASTRODUODENOSCOPY (EGD) WITH PROPOFOL N/A 12/21/2015   Procedure: ESOPHAGOGASTRODUODENOSCOPY (EGD) WITH PROPOFOL;  Surgeon: [name], MD;  Location: [facility];  Service: Endoscopy;  Laterality: N/A;  . ESOPHAGOGASTRODUODENOSCOPY (EGD) WITH PROPOFOL N/A 02/24/2016   Procedure: ESOPHAGOGASTRODUODENOSCOPY (EGD) WITH PROPOFOL;  Surgeon: [name], MD;  Location: [place];  Service: Endoscopy;  Laterality: N/A;  diabetic - oral med  . POLYPECTOMY N/A 12/09/2015   Procedure: POLYPECTOMY;  Surgeon: [name], MD;  Location: [place];  Service: Endoscopy;  Laterality: N/A;  . TONSILLECTOMY      SOCIAL HISTORY:   Social History   Tobacco Use  . Smoking status: Never Smoker  . Smokeless tobacco: Never Used  Substance Use Topics  . Alcohol use: No    Alcohol/week: 0.0 oz    FAMILY HISTORY:   Family History  Problem Relation Age of Onset  . Aneurysm Father 26       of the heart  . Heart attack Father   . Diabetes Mother     DRUG ALLERGIES:   Allergies  Allergen Reactions  . Niaspan [Niacin Er] Other (See Comments)    Just does not want to take     REVIEW OF SYSTEMS:   ROS  MEDICATIONS AT HOME:   Prior to Admission medications   Medication Sig Start Date End Date Taking? Authorizing Provider  Alpha-Lipoic Acid (LIPOIC ACID PO) Take 200 mg by mouth daily.    Yes [provider]  Ascorbic Acid (VITAMIN C) 1000 MG tablet Take 1,000 mg by mouth daily.   Yes [provider]  B Complex Vitamins (VITAMIN B COMPLEX) TABS Take by mouth daily.   Yes [provider]  blood glucose meter kit and supplies KIT Dispense based on patient and insurance preference. Use up to four times daily as directed. E11.9 Please dispense Freestyle freedom meter and supplies. 09/09/15  Yes Cook, Jayce G, DO  Coenzyme Q10 100 MG capsule Take 100 mg by mouth daily.    Yes  [provider]  furosemide (LASIX) 20 MG tablet Take 1 tablet (20 mg total) by mouth 2 (two) times daily. Patient taking differently: Take 20 mg by mouth as needed.  02/08/17  Yes [name], MD  glipiZIDE (GLUCOTROL) 5 MG tablet Take 1 tablet (5 mg total) by mouth See admin instructions. 10/02/16  Yes Einar Pheasant, MD  glucose blood (FREESTYLE LITE) test strip USE TO CHECK BLOOD SUGAR THREE TIMES A DAY 05/14/17  Yes Einar Pheasant, MD  glucose blood (ONE TOUCH ULTRA TEST) test strip Use as instructed 11/18/15  Yes Einar Pheasant, MD  insulin glargine (LANTUS) 100 unit/mL SOPN Inject 0.1 mLs (10 Units total) into the skin at bedtime. Patient taking differently: Inject 16 Units into the skin at bedtime.  11/14/16  Yes Einar Pheasant, MD  Insulin Pen Needle (PEN NEEDLES) 31G X 6 MM MISC 1 application by Does not apply route 4 (four) times daily. 11/10/16  Yes Einar Pheasant, MD  lactulose (CHRONULAC) 10 GM/15ML solution Take 30 mLs (20 g total) by mouth 4 (four) times  daily. 05/04/17  Yes [name], MD  Lancets [facility] ULTRASOFT) lancets Use as instructed 11/01/16  Yes Einar Pheasant, MD  lisinopril (PRINIVIL,ZESTRIL) 20 MG tablet TAKE ONE TABLET BY MOUTH EVERY DAY/ AM 01/11/15  Yes [provider]  MILK THISTLE PO Take 25 mg by mouth daily.   Yes [provider]  NON FORMULARY Take 1 tablet by mouth daily. burburan   Yes [provider]  Omega-3 Fatty Acids (FISH OIL) 1000 MG CAPS Take 4,000 mg by mouth daily.    Yes [provider]  rifaximin (XIFAXAN) 550 MG TABS tablet Take 1 tablet (550 mg total) by mouth 2 (two) times daily. 07/31/16  Yes [name], MD  Syringe/Needle, Disp, 30G X 1/2" 1 ML MISC 1 Syringe by Does not apply route as directed. 11/01/16  Yes Einar Pheasant, MD  Theanine 100 MG CAPS Take by mouth daily.   Yes [provider]  triamcinolone cream (KENALOG) 0.1 % Apply topically 2 (two) times daily. Please avoid face  and genitalia area.  Do not use in the same spot for more than 7-10 days in a row 05/18/17  Yes Einar Pheasant, MD  Turmeric POWD 4,000 mg by Does not apply route daily.   Yes [provider]  Cinnamon 500 MG capsule Take 500 mg by mouth.    [provider]  sildenafil (REVATIO) 20 MG tablet 3 to 5 tablets per day or as instructed 09/01/15   [provider]  spironolactone (ALDACTONE) 50 MG tablet Take 1 tablet (50 mg total) by mouth daily. Patient not taking: Reported on 06/15/2017 02/08/17   [name], MD  traMADol (ULTRAM) 50 MG tablet Take 1 tablet (50 mg total) by mouth every 6 (six) hours as needed. Patient not taking: Reported on 05/29/2017 01/20/17 01/20/18  Nance Pear, MD  zinc sulfate 220 (50 Zn) MG capsule Take 1 capsule (220 mg total) by mouth 2 (two) times daily. Patient not taking: Reported on 06/14/2017 10/03/16   [name], MD      VITAL SIGNS:  Blood pressure (!) 133/55, pulse 87, temperature (!) 97.5 F (36.4 C), resp. rate 20, height _0  (1.753 m), weight 74.6 kg (164 lb 7.4 oz), SpO2 98 %.  PHYSICAL EXAMINATION:  Physical Exam  GENERAL:  77 y.o.-year-old patient lying in the bed with no acute distress.  EYES: Pupils equal, round, reactive to light and accommodation. No scleral icterus. Extraocular muscles intact.  HEENT: Head atraumatic, normocephalic. Oropharynx and nasopharynx clear.  NECK:  Supple, no jugular venous distention. No thyroid enlargement, no tenderness.  LUNGS: Normal breath sounds bilaterally, no wheezing, rales,rhonchi or crepitation. No use of accessory muscles of respiration.  CARDIOVASCULAR: S1, S2 normal. No murmurs, rubs, or gallops.  ABDOMEN: Soft, nontender, nondistended. Bowel sounds present. No organomegaly or mass.  EXTREMITIES: No pedal edema, cyanosis, or clubbing.  NEUROLOGIC: Cranial nerves II through XII are intact. Muscle strength 5/5 in all extremities. Sensation intact. Gait not checked.    PSYCHIATRIC: The patient is alert and oriented x 3.  SKIN: No obvious rash, lesion, or ulcer.   LABORATORY PANEL:   CBC Recent Labs  Lab 06/13/2017 0226  WBC 14.9*  HGB 6.2*  HCT 18.6*  PLT 163   ------------------------------------------------------------------------------------------------------------------  Chemistries  Recent Labs  Lab 06/16/2017 2244 06/08/2017 0226  NA 137 138  K 4.8 5.8*  CL 116* 118*  CO2 17* 15*  GLUCOSE 303* 328*  BUN 30* 39*  CREATININE 1.48* 1.57*  CALCIUM 8.3* 7.7*  AST 25  --   ALT 18  --   ALKPHOS 59  --   BILITOT 0.7  --    ------------------------------------------------------------------------------------------------------------------  Cardiac Enzymes No results for input(s): TROPONINI in the last 168 hours. ------------------------------------------------------------------------------------------------------------------  RADIOLOGY:  No results found.    IMPRESSION AND PLAN:   A/P: 17M UGIB, presumed 2/2 esophageal varices.  1.) UGIB/symptomatic acute blood loss anemia: Pt p/w hematemesis x1d, which has continued into hospitalization. Pt w/ syncope, hypotension, hemodynamic instability 2/2 continued bleeding, prompting ICU upgrade. GI (Dr. Bonna Gains) contacted, recommended continuing Octreotide, Protonix, Ceftriaxone initiated in ED, and advised low threshold for intubation (if continued hematemesis), with likely plan to perform EGD on 06/14/2017 AM. Dr. Bonna Gains + ICU Attending (Dr. Tamala Julian) informed of coagulopathy (INR 1.3) and thrombocytopenia (Plt 146), likely 2/2 cirrhosis (w/ known Hx hepatic enceph, portal HTN). Initial Hgb 8.0, dropped to 6.2, pt s/p 1u pRBC txfn. Hgb q6h, transfuse as appropriate (goal Hgb 7.0+). ICU monitoring/level of care.  2.) Hyperkalemia: K+ 5.8 on admission. Possibly 2/2 intravascular volume depletion. IVF, recheck with AM labs. Hold ACE-I.  3.) Hyperglycemia/T2IDDM: Pt w/ IDDM, takes Lantus 16 qHS.  NPO 2/2 UGIB. Hold long-acting insulin. MSSI.  4.) CKD: Cr 1.57 on admission. Baseline suspected to be 1.2-1.4 based on prior labwork. Cr elevation does not meet criteria for AKI. 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 Monitor renal function, avoid nephrotoxins.  5.) NAFLD/Cirrhosis: c/w home Lactulose. If crushable, will continue Rifaximin and Aldactone. Otherwise NPO 2/2 UGIB, as above.  6.) HTN: Hold PO agents.  7.) HLD/CAD/MI: Hold PO agents.  8.) FEN/GI: Octreotide gtt, Protonix 40 IV BID, Ceftriaxone as above. IVF, NPO.  9.) DVT PPx: SCDs, no pharmacological VTE PPx (2/2 active UGIB).  10.) Code Status: Full code per pt and family.  11.) Disposition: Admission, pt expected to stay > 2 midnights.  All the records are reviewed and case discussed with ED provider. Management plans discussed with the patient, family and they are in agreement.  CODE STATUS: Full code.  TOTAL TIME TAKING CARE OF THIS PATIENT: 90 minutes.    Arta Silence M.D on 06/08/2017 at 4:00 AM  Between 7am to 6pm - Pager - [phone]  After 6pm go to [email or web address] - Proofreader  [employer] [facility] [place] [facility]  Office  [phone]  CC: Primary care physician; Einar Pheasant, MD   Note: This dictation was prepared with Dragon dictation along with smaller phrase technology. Any transcriptional errors that result from this process are unintentional.

## 2017-06-03 NOTE — Anesthesia Preprocedure Evaluation (Deleted)
Anesthesia Evaluation  Patient identified by MRN, date of birth, ID band Patient awake    Reviewed: Allergy & Precautions, NPO status , Patient's Chart, lab work & pertinent test results  History of Anesthesia Complications Negative for: history of anesthetic complications  Airway Mallampati: II  TM Distance: >3 FB Neck ROM: Full    Dental  (+) Poor Dentition   Pulmonary neg pulmonary ROS, neg sleep apnea, neg COPD,    breath sounds clear to auscultation- rhonchi (-) wheezing      Cardiovascular hypertension, Pt. on medications + CAD, + Past MI and + Cardiac Stents   Rhythm:Regular Rate:Normal - Systolic murmurs and - Diastolic murmurs    Neuro/Psych negative neurological ROS  negative psych ROS   GI/Hepatic (+) Cirrhosis  (NAFLD)      , GIB   Endo/Other  diabetes, Oral Hypoglycemic Agents, Insulin Dependent  Renal/GU Renal InsufficiencyRenal disease     Musculoskeletal negative musculoskeletal ROS (+)   Abdominal (+) - obese,   Peds  Hematology  (+) anemia ,   Anesthesia Other Findings Past Medical History: No date: Anemia No date: Atherosclerotic heart disease     Comment:  s/p MI, s/p stent mid circumflex No date: CAD (coronary artery disease)     Comment:  Dr Nehemiah Massed, cardiologist No date: Cirrhosis of liver ([place]) No date: Clavicle fracture No date: Diabetes mellitus without complication ([place])     Comment:  oral med No date: Elevated blood sugar     Comment:  02/03/16 and 02/09/16 had BS over 400.  otherwise               running around 250 No date: Esophageal varices (HCC) No date: Hepatic encephalopathy (HCC) No date: Hypercholesterolemia No date: Hypertension     Comment:  controlled on meds No date: Internal hemorrhoids No date: Leg fracture No date: Multiple myeloma (HCC) No date: Myocardial infarct (HCC) No date: Prostate enlargement No date: Tubular adenoma of colon   Reproductive/Obstetrics                             Anesthesia Physical Anesthesia Plan  ASA: III  Anesthesia Plan: General   Post-op Pain Management:    Induction: Intravenous and Rapid sequence  PONV Risk Score and Plan: 1 and Ondansetron  Airway Management Planned: Oral ETT  Additional Equipment:   Intra-op Plan:   Post-operative Plan: Extubation in OR  Informed Consent: I have reviewed the patients History and Physical, chart, labs and discussed the procedure including the risks, benefits and alternatives for the proposed anesthesia with the patient or authorized representative who has indicated his/her understanding and acceptance.   Dental advisory given  Plan Discussed with: CRNA and Anesthesiologist  Anesthesia Plan Comments: (Currently getting treatment for hyperkalemia, holding off on proceeding until potassium is below 5.0)        Anesthesia Quick Evaluation

## 2017-06-03 NOTE — Progress Notes (Signed)
A/P: 3M UGIB, presumed 2/2 esophageal varices.  1.) UGIB/symptomatic acute blood loss anemia: Pt p/w hematemesis x1d, which has continued into hospitalization. Pt w/ syncope, hypotension, hemodynamic instability 2/2 continued bleeding, prompting ICU upgrade. GI (Dr. Bonna Gains) contacted, recommended continuing Octreotide, Protonix, Ceftriaxone initiated in ED, and advised low threshold for intubation (if continued hematemesis), with likely plan to perform EGD on 06/10/2017 AM. Dr. Bonna Gains + ICU Attending (Dr. Tamala Julian) informed of coagulopathy (INR 1.3) and thrombocytopenia (Plt 146), likely 2/2 cirrhosis (w/ known Hx hepatic enceph, portal HTN). Initial Hgb 8.0, dropped to 6.2, pt s/p 1u pRBC txfn. Hgb q6h, transfuse as appropriate (goal Hgb 7.0+). ICU monitoring/level of care.  2.) Hyperkalemia: K+ 5.8 on admission. Possibly 2/2 intravascular volume depletion. IVF, recheck with AM labs. Hold ACE-I.  3.) Hyperglycemia/T2IDDM: Pt w/ IDDM, takes Lantus 16 qHS. NPO 2/2 UGIB. Hold long-acting insulin. MSSI.  4.) CKD: Cr 1.57 on admission. Baseline suspected to be 1.2-1.4 based on prior labwork. Cr elevation does not meet criteria for AKI. 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 Monitor renal function, avoid nephrotoxins.  5.) NAFLD/Cirrhosis: c/w home Lactulose. If crushable, will continue Rifaximin and Aldactone. Otherwise NPO 2/2 UGIB, as above.  6.) HTN: Hold PO agents.  7.) HLD/CAD/MI: Hold PO agents.  8.) FEN/GI: Octreotide gtt, Protonix 40 IV BID, Ceftriaxone as above. IVF, NPO.  9.) DVT PPx: SCDs, no pharmacological VTE PPx (2/2 active UGIB).  10.) Code Status: Full code per pt and family.  11.) Disposition: Admission, pt expected to stay > 2 midnights.   Agree with above

## 2017-06-03 NOTE — Progress Notes (Addendum)
Per Dr. Celesta Aver, a serum K should be checked one hour after initiation of insulin drip, and if serum K is still elevated it should be checked again in 4 hours. Per Dr. Bonna Gains, pt should remain NPO including lactulose and rifampin for now. Dr. Bonna Gains also notified that Pt has received ondansetron for nausea without relief and that his repeat hgb was 7.2. No new orders received.

## 2017-06-03 NOTE — Progress Notes (Signed)
Per Dr. Bonna Gains will hold his lactulose and rifaxamin until after his procedure today.

## 2017-06-03 NOTE — Progress Notes (Addendum)
Pharmacy Electrolyte Monitoring Consult:  Pharmacy consulted to assist in monitoring and replacing electrolytes in this 77 y.o. male admitted on 05/25/2017 with GI Bleeding   Labs:  Sodium (mmol/L)  Date Value  06/06/2017 138   Potassium (mmol/L)  Date Value  06/05/2017 5.9 (H)   Magnesium (mg/dL)  Date Value  06/05/2017 1.7   Phosphorus (mg/dL)  Date Value  05/23/2017 3.9   Calcium (mg/dL)  Date Value  05/31/2017 8.4 (L)   Albumin (g/dL)  Date Value  05/24/2017 2.2 (L)  11/18/2013 3.3 (L)    Plan: Pharmacy to start insulin drip at 3 units/hr and dextrose infusion for hyperkalemia.  K 5.9; Will follow K q4h.   Pharmacy to continue to follow and monitor electrolytes per protocol.    Candelaria Stagers, PharmD Pharmacy Resident  05/25/2017 2:21 PM

## 2017-06-03 NOTE — Progress Notes (Signed)
Pt's family requesting that pt have his ammonia level checked. In addition, they tell me that at home he takes 10-55ml of lactulose QID based on whether or not he is having diarrhea or slurring his words. They asked that he receive only 23mls of the 40mls that was ordered this evening. 71mls administered per pt and family request. His CBGs have been trending down with the most recent CBG being 119. Dr. Celesta Aver notified of the above and she stated that we can change the ordered lactulose dose to 12mls QID, increase D10 to 47ml/hr, and order an ammonia level at next blood draw. Orders updated in epic.

## 2017-06-03 NOTE — Progress Notes (Signed)
Patient's repeat potassium after correction was 5.4 today.  Spoke to Dr. Randa Lynn from anesthesia.  She does not want to proceed with endoscopy at this time, as there is a concern with succinylcholine and hyperkalemia during intubation.  She recommends waiting until potassium is less than 5.  She is also monitoring the patient's chart, and states that the patient is clinically stable at this time, with stable blood pressures and heart rate.  She states that unless this is an emergency, which would mean hemodynamic instability, that an emergent endoscopy can be done at that time.  Otherwise, since he is stable, she would like to wait for the procedure until potassium is corrected.  I have discussed this with the patient, family, Dr. Celesta Aver, and patient's nurse Judson Roch and they are all in understanding.  We will plan on doing the EGD once potassium is corrected.  Continue electrolyte replacement potassium correction as appropriate.  If patient becomes hemodynamically unstable, or has repeated episodes of vomiting, low threshold for intubation.  This was discussed with patient, family, and intensivist.  Continue octreotide drip, and Protonix.

## 2017-06-04 ENCOUNTER — Encounter: Admission: EM | Disposition: E | Payer: Self-pay | Source: Home / Self Care | Attending: Family Medicine

## 2017-06-04 ENCOUNTER — Inpatient Hospital Stay: Payer: PPO

## 2017-06-04 ENCOUNTER — Encounter: Payer: Self-pay | Admitting: Anesthesiology

## 2017-06-04 ENCOUNTER — Inpatient Hospital Stay
Admission: EM | Admit: 2017-06-04 | Payer: Self-pay | Source: Other Acute Inpatient Hospital | Admitting: Pulmonary Disease

## 2017-06-04 ENCOUNTER — Inpatient Hospital Stay: Payer: PPO | Admitting: Certified Registered Nurse Anesthetist

## 2017-06-04 DIAGNOSIS — I8511 Secondary esophageal varices with bleeding: Secondary | ICD-10-CM

## 2017-06-04 DIAGNOSIS — Z452 Encounter for adjustment and management of vascular access device: Secondary | ICD-10-CM

## 2017-06-04 DIAGNOSIS — K920 Hematemesis: Secondary | ICD-10-CM

## 2017-06-04 HISTORY — PX: ESOPHAGOGASTRODUODENOSCOPY: SHX5428

## 2017-06-04 LAB — RENAL FUNCTION PANEL
ANION GAP: 5 (ref 5–15)
Albumin: 2.2 g/dL — ABNORMAL LOW (ref 3.5–5.0)
Albumin: 1.8 g/dL — ABNORMAL LOW (ref 3.5–5.0)
Anion gap: 4 — ABNORMAL LOW (ref 5–15)
BUN: 51 mg/dL — ABNORMAL HIGH (ref 6–20)
BUN: 56 mg/dL — AB (ref 6–20)
CHLORIDE: 116 mmol/L — AB (ref 101–111)
CHLORIDE: 116 mmol/L — AB (ref 101–111)
CO2: 18 mmol/L — AB (ref 22–32)
CO2: 15 mmol/L — AB (ref 22–32)
CREATININE: 1.97 mg/dL — AB (ref 0.61–1.24)
Calcium: 8.3 mg/dL — ABNORMAL LOW (ref 8.9–10.3)
Calcium: 7.4 mg/dL — ABNORMAL LOW (ref 8.9–10.3)
Creatinine, Ser: 3.13 mg/dL — ABNORMAL HIGH (ref 0.61–1.24)
GFR calc Af Amer: 36 mL/min — ABNORMAL LOW (ref >60)
GFR calc Af Amer: 21 mL/min — ABNORMAL LOW (ref >60)
GFR calc non Af Amer: 18 mL/min — ABNORMAL LOW (ref >60)
GFR, EST NON AFRICAN AMERICAN: 31 mL/min — AB (ref >60)
GLUCOSE: 198 mg/dL — AB (ref 65–99)
Glucose, Bld: 114 mg/dL — ABNORMAL HIGH (ref 65–99)
POTASSIUM: 5.1 mmol/L (ref 3.5–5.1)
POTASSIUM: 5.8 mmol/L — AB (ref 3.5–5.1)
Phosphorus: 3.8 mg/dL (ref 2.5–4.6)
Phosphorus: 7.2 mg/dL — ABNORMAL HIGH (ref 2.5–4.6)
Sodium: 138 mmol/L (ref 135–145)
Sodium: 136 mmol/L (ref 135–145)

## 2017-06-04 LAB — HEPATIC FUNCTION PANEL
ALT: 29 U/L (ref 17–63)
AST: 41 U/L (ref 15–41)
Albumin: 1.9 g/dL — ABNORMAL LOW (ref 3.5–5.0)
Alkaline Phosphatase: 47 U/L (ref 38–126)
BILIRUBIN DIRECT: 0.1 mg/dL (ref 0.1–0.5)
BILIRUBIN TOTAL: 0.8 mg/dL (ref 0.3–1.2)
Indirect Bilirubin: 0.7 mg/dL (ref 0.3–0.9)
Total Protein: 4.4 g/dL — ABNORMAL LOW (ref 6.5–8.1)

## 2017-06-04 LAB — BLOOD GAS, ARTERIAL
Acid-base deficit: 14.9 mmol/L — ABNORMAL HIGH (ref 0.0–2.0)
BICARBONATE: 12.9 mmol/L — AB (ref 20.0–28.0)
FIO2: 0.4
MECHVT: 500 mL
O2 SAT: 97.8 %
PATIENT TEMPERATURE: 37
PEEP/CPAP: 5 cmH2O
PH ART: 7.15 — AB (ref 7.350–7.450)
PO2 ART: 127 mmHg — AB (ref 83.0–108.0)
RATE: 15 resp/min
pCO2 arterial: 37 mmHg (ref 32.0–48.0)

## 2017-06-04 LAB — GLUCOSE, CAPILLARY
GLUCOSE-CAPILLARY: 107 mg/dL — AB (ref 65–99)
GLUCOSE-CAPILLARY: 132 mg/dL — AB (ref 65–99)
GLUCOSE-CAPILLARY: 266 mg/dL — AB (ref 65–99)
GLUCOSE-CAPILLARY: 286 mg/dL — AB (ref 65–99)
GLUCOSE-CAPILLARY: 275 mg/dL — AB (ref 65–99)
Glucose-Capillary: 103 mg/dL — ABNORMAL HIGH (ref 65–99)
Glucose-Capillary: 184 mg/dL — ABNORMAL HIGH (ref 65–99)
Glucose-Capillary: 198 mg/dL — ABNORMAL HIGH (ref 65–99)
Glucose-Capillary: 294 mg/dL — ABNORMAL HIGH (ref 65–99)
Glucose-Capillary: 330 mg/dL — ABNORMAL HIGH (ref 65–99)
Glucose-Capillary: 77 mg/dL (ref 65–99)
Glucose-Capillary: 79 mg/dL (ref 65–99)

## 2017-06-04 LAB — HEMOGLOBIN
HEMOGLOBIN: 7.1 g/dL — AB (ref 13.0–18.0)
Hemoglobin: 7.2 g/dL — ABNORMAL LOW (ref 13.0–18.0)
Hemoglobin: 8.3 g/dL — ABNORMAL LOW (ref 13.0–18.0)

## 2017-06-04 LAB — BASIC METABOLIC PANEL
Anion gap: 4 — ABNORMAL LOW (ref 5–15)
Anion gap: 4 — ABNORMAL LOW (ref 5–15)
BUN: 54 mg/dL — ABNORMAL HIGH (ref 6–20)
BUN: 52 mg/dL — AB (ref 6–20)
CALCIUM: 7.4 mg/dL — AB (ref 8.9–10.3)
CHLORIDE: 117 mmol/L — AB (ref 101–111)
CHLORIDE: 120 mmol/L — AB (ref 101–111)
CO2: 17 mmol/L — AB (ref 22–32)
CO2: 16 mmol/L — AB (ref 22–32)
CREATININE: 2.87 mg/dL — AB (ref 0.61–1.24)
Calcium: 8.2 mg/dL — ABNORMAL LOW (ref 8.9–10.3)
Creatinine, Ser: 2.02 mg/dL — ABNORMAL HIGH (ref 0.61–1.24)
GFR calc Af Amer: 35 mL/min — ABNORMAL LOW (ref >60)
GFR calc non Af Amer: 30 mL/min — ABNORMAL LOW (ref >60)
GFR calc non Af Amer: 20 mL/min — ABNORMAL LOW (ref >60)
GFR, EST AFRICAN AMERICAN: 23 mL/min — AB (ref >60)
Glucose, Bld: 154 mg/dL — ABNORMAL HIGH (ref 65–99)
Glucose, Bld: 245 mg/dL — ABNORMAL HIGH (ref 65–99)
POTASSIUM: 5 mmol/L (ref 3.5–5.1)
Potassium: 6.4 mmol/L (ref 3.5–5.1)
SODIUM: 138 mmol/L (ref 135–145)
SODIUM: 140 mmol/L (ref 135–145)

## 2017-06-04 LAB — POTASSIUM
Potassium: 5.7 mmol/L — ABNORMAL HIGH (ref 3.5–5.1)
Potassium: 6.7 mmol/L (ref 3.5–5.1)

## 2017-06-04 LAB — PREPARE RBC (CROSSMATCH)

## 2017-06-04 SURGERY — EGD (ESOPHAGOGASTRODUODENOSCOPY)
Anesthesia: General

## 2017-06-04 MED ORDER — EPHEDRINE SULFATE 50 MG/ML IJ SOLN
INTRAMUSCULAR | Status: DC | PRN
  Administered 2017-06-04: 10 mg via INTRAVENOUS

## 2017-06-04 MED ORDER — SODIUM CHLORIDE 0.9 % IV SOLN
250.0000 mg | INTRAVENOUS | Status: DC
  Filled 2017-06-04: qty 5

## 2017-06-04 MED ORDER — FENTANYL 2500MCG IN NS 250ML (10MCG/ML) PREMIX INFUSION
25.0000 ug/h | INTRAVENOUS | Status: DC
  Administered 2017-06-04: 100 ug/h via INTRAVENOUS
  Administered 2017-06-05: 300 ug/h via INTRAVENOUS
  Administered 2017-06-06: 100 ug/h via INTRAVENOUS
  Filled 2017-06-04 (×3): qty 250

## 2017-06-04 MED ORDER — SODIUM CHLORIDE 0.9 % IV SOLN: Freq: Once | INTRAVENOUS | Status: DC

## 2017-06-04 MED ORDER — SODIUM CHLORIDE 0.9 % IV BOLUS
2000.0000 mL | Freq: Once | INTRAVENOUS | Status: AC
  Administered 2017-06-04: 2000 mL via INTRAVENOUS

## 2017-06-04 MED ORDER — FENTANYL BOLUS VIA INFUSION
25.0000 ug | INTRAVENOUS | Status: DC | PRN
  Filled 2017-06-04: qty 25

## 2017-06-04 MED ORDER — PUREFLOW DIALYSIS SOLUTION
INTRAVENOUS | Status: DC
  Administered 2017-06-04: 3 via INTRAVENOUS_CENTRAL
  Administered 2017-06-05: 22:00:00 via INTRAVENOUS_CENTRAL
  Administered 2017-06-05: 3 via INTRAVENOUS_CENTRAL
  Administered 2017-06-06: 03:00:00 via INTRAVENOUS_CENTRAL

## 2017-06-04 MED ORDER — STERILE WATER FOR INJECTION IV SOLN
INTRAVENOUS | Status: DC
  Administered 2017-06-04 – 2017-06-05 (×2): via INTRAVENOUS
  Filled 2017-06-04 (×3): qty 850

## 2017-06-04 MED ORDER — INSULIN ASPART 100 UNIT/ML ~~LOC~~ SOLN
0.0000 [IU] | SUBCUTANEOUS | Status: DC
  Administered 2017-06-04: 11 [IU] via SUBCUTANEOUS
  Administered 2017-06-04: 8 [IU] via SUBCUTANEOUS
  Administered 2017-06-04: 3 [IU] via SUBCUTANEOUS
  Administered 2017-06-05: 2 [IU] via SUBCUTANEOUS
  Administered 2017-06-06 (×2): 3 [IU] via SUBCUTANEOUS
  Administered 2017-06-06: 2 [IU] via SUBCUTANEOUS
  Administered 2017-06-06 – 2017-06-07 (×3): 3 [IU] via SUBCUTANEOUS
  Filled 2017-06-04 (×11): qty 1

## 2017-06-04 MED ORDER — DEXTROSE 50 % IV SOLN
1.0000 | Freq: Once | INTRAVENOUS | Status: AC
  Administered 2017-06-04: 50 mL via INTRAVENOUS
  Filled 2017-06-04: qty 50

## 2017-06-04 MED ORDER — PROPOFOL 10 MG/ML IV BOLUS
INTRAVENOUS | Status: DC | PRN
  Administered 2017-06-04: 100 mg via INTRAVENOUS

## 2017-06-04 MED ORDER — CEFTRIAXONE SODIUM 2 G IJ SOLR
2.0000 g | INTRAMUSCULAR | Status: DC
  Administered 2017-06-05 – 2017-06-06 (×2): 2 g via INTRAVENOUS
  Filled 2017-06-04 (×3): qty 20

## 2017-06-04 MED ORDER — MIDAZOLAM HCL 2 MG/2ML IJ SOLN
1.0000 mg | INTRAMUSCULAR | Status: DC | PRN
  Filled 2017-06-04: qty 2

## 2017-06-04 MED ORDER — VASOPRESSIN 20 UNIT/ML IV SOLN
0.0300 [IU]/min | INTRAVENOUS | Status: DC
  Administered 2017-06-04 – 2017-06-06 (×3): 0.03 [IU]/min via INTRAVENOUS
  Filled 2017-06-04 (×4): qty 2

## 2017-06-04 MED ORDER — MIDAZOLAM HCL 2 MG/2ML IJ SOLN
1.0000 mg | INTRAMUSCULAR | Status: DC | PRN
  Administered 2017-06-04 – 2017-06-06 (×5): 2 mg via INTRAVENOUS
  Filled 2017-06-04 (×4): qty 2

## 2017-06-04 MED ORDER — HEPARIN SODIUM (PORCINE) 1000 UNIT/ML DIALYSIS
1000.0000 [IU] | INTRAMUSCULAR | Status: DC | PRN
  Administered 2017-06-06: 1000 [IU] via INTRAVENOUS_CENTRAL
  Filled 2017-06-04: qty 1
  Filled 2017-06-04 (×2): qty 6

## 2017-06-04 MED ORDER — SODIUM CHLORIDE 0.9 % IV BOLUS
500.0000 mL | Freq: Once | INTRAVENOUS | Status: AC
  Administered 2017-06-04: 500 mL via INTRAVENOUS

## 2017-06-04 MED ORDER — PHENYLEPHRINE HCL 10 MG/ML IJ SOLN
INTRAMUSCULAR | Status: DC | PRN
  Administered 2017-06-04 (×6): 200 ug via INTRAVENOUS

## 2017-06-04 MED ORDER — PROPOFOL 10 MG/ML IV BOLUS
INTRAVENOUS | Status: AC
  Filled 2017-06-04: qty 20

## 2017-06-04 MED ORDER — SUCCINYLCHOLINE CHLORIDE 20 MG/ML IJ SOLN
INTRAMUSCULAR | Status: DC | PRN
  Administered 2017-06-04: 100 mg via INTRAVENOUS

## 2017-06-04 MED ORDER — FENTANYL 2500MCG IN NS 250ML (10MCG/ML) PREMIX INFUSION
INTRAVENOUS | Status: AC
  Filled 2017-06-04: qty 250

## 2017-06-04 MED ORDER — FENTANYL CITRATE (PF) 100 MCG/2ML IJ SOLN: 50.0000 ug | Freq: Once | INTRAMUSCULAR | Status: AC

## 2017-06-04 MED ORDER — NOREPINEPHRINE BITARTRATE 1 MG/ML IV SOLN
0.0000 ug/min | INTRAVENOUS | Status: DC
  Administered 2017-06-04: 2 ug/min via INTRAVENOUS
  Administered 2017-06-05: 25 ug/min via INTRAVENOUS
  Administered 2017-06-06: 30 ug/min via INTRAVENOUS
  Administered 2017-06-06: 20 ug/min via INTRAVENOUS
  Administered 2017-06-07: 35 ug/min via INTRAVENOUS
  Filled 2017-06-04 (×6): qty 16

## 2017-06-04 MED ORDER — SODIUM CHLORIDE 0.9 % IV SOLN
Freq: Once | INTRAVENOUS | Status: AC
  Administered 2017-06-04: 10:00:00 via INTRAVENOUS

## 2017-06-04 MED ORDER — INSULIN ASPART 100 UNIT/ML ~~LOC~~ SOLN
10.0000 [IU] | Freq: Once | SUBCUTANEOUS | Status: DC
  Filled 2017-06-04: qty 1
  Filled 2017-06-04: qty 0.1

## 2017-06-04 MED ORDER — SODIUM CHLORIDE 0.9 % IV SOLN
0.0000 ug/min | INTRAVENOUS | Status: DC
  Administered 2017-06-04: 125 ug/min via INTRAVENOUS
  Administered 2017-06-04: 10 ug/min via INTRAVENOUS
  Filled 2017-06-04 (×2): qty 1
  Filled 2017-06-04: qty 10

## 2017-06-04 MED ORDER — NOREPINEPHRINE 4 MG/250ML-% IV SOLN
0.0000 ug/min | INTRAVENOUS | Status: DC
  Administered 2017-06-04: 5 ug/min via INTRAVENOUS
  Filled 2017-06-04: qty 250

## 2017-06-04 MED ORDER — EPINEPHRINE PF 1 MG/10ML IJ SOSY
PREFILLED_SYRINGE | INTRAMUSCULAR | Status: AC
  Filled 2017-06-04: qty 10

## 2017-06-04 MED ORDER — INSULIN REGULAR HUMAN 100 UNIT/ML IJ SOLN
10.0000 [IU] | Freq: Once | INTRAMUSCULAR | Status: AC
  Administered 2017-06-04: 10 [IU] via INTRAVENOUS
  Filled 2017-06-04: qty 0.1

## 2017-06-04 MED ORDER — ROCURONIUM BROMIDE 100 MG/10ML IV SOLN
INTRAVENOUS | Status: DC | PRN
  Administered 2017-06-04: 10 mg via INTRAVENOUS

## 2017-06-04 MED ORDER — SODIUM CHLORIDE 0.9 % IV SOLN
0.0000 ug/min | INTRAVENOUS | Status: DC
  Administered 2017-06-04 (×2): 200 ug/min via INTRAVENOUS
  Administered 2017-06-05: 175 ug/min via INTRAVENOUS
  Administered 2017-06-05: 250 ug/min via INTRAVENOUS
  Administered 2017-06-05: 230 ug/min via INTRAVENOUS
  Administered 2017-06-05: 150 ug/min via INTRAVENOUS
  Administered 2017-06-05: 250 ug/min via INTRAVENOUS
  Administered 2017-06-05: 280 ug/min via INTRAVENOUS
  Administered 2017-06-06: 60 ug/min via INTRAVENOUS
  Administered 2017-06-06: 170 ug/min via INTRAVENOUS
  Filled 2017-06-04 (×4): qty 40
  Filled 2017-06-04: qty 4
  Filled 2017-06-04: qty 40
  Filled 2017-06-04 (×2): qty 4
  Filled 2017-06-04 (×5): qty 40

## 2017-06-04 MED ORDER — FUROSEMIDE 10 MG/ML IJ SOLN
40.0000 mg | Freq: Once | INTRAMUSCULAR | Status: AC
  Administered 2017-06-04: 40 mg via INTRAVENOUS
  Filled 2017-06-04: qty 4

## 2017-06-04 MED ORDER — SODIUM BICARBONATE 8.4 % IV SOLN: 100.0000 meq | Freq: Once | INTRAVENOUS | Status: DC

## 2017-06-04 NOTE — Progress Notes (Signed)
CRRT started as ordered.

## 2017-06-04 NOTE — Op Note (Signed)
[facility]astroenterology Patient Name: Zachary Buck Procedure Date: 05/24/2017 9:40 AM MRN: [id number] Account #: [id number] Date of Birth: 1940-12-18 Admit Type: Inpatient Age: 77 Room: [facility] ENDO ROOM 3 Gender: Male Note Status: Finalized Procedure:            Upper GI endoscopy Indications:          Hematemesis Providers:            Mecca Guitron B. Bonna Gains MD, MD Referring MD:         Einar Pheasant, MD (Referring MD) Medicines:            Monitored Anesthesia Care Complications:        No immediate complications. Procedure:            Pre-Anesthesia Assessment:                       - The risks and benefits of the procedure and the                        sedation options and risks were discussed with the                        patient. All questions were answered and informed                        consent was obtained.                       - Patient identification and proposed procedure were                        verified prior to the procedure.                       - ASA Grade Assessment: IV - A patient with severe                        systemic disease that is a constant threat to life.                       After obtaining informed consent, the endoscope was                        passed under direct vision. Throughout the procedure,                        the patient's blood pressure, pulse, and oxygen                        saturations were monitored continuously. The Endoscope                        was introduced through the mouth, and advanced to the                        lower third of esophagus. The upper GI endoscopy was                        accomplished with ease. The patient tolerated the  procedure well. Findings:      Oozing grade III varices were found in the mid esophagus and in the       distal esophagus,. Stigmata of recent bleeding were evident and red wale       signs were present. Visible vessel  was present. Ten bands were       successfully placed with incomplete eradication of varices. A slow ooze       remained at the end of the procedure. Hemospray was then used to help       stop the bleeding. After Hemospray the bleeding stopped.      A lot of bright red blood and clots covered the esophagus affecting       visualization. The stomach could not be intubated due to a large amount       of blood present in the stomach causing a complete red out of the camera       lens with inability to see anything in the stomach. The active bleeding       was found in the esophagus and thus it was not necessary to intubate the       stomach. Impression:           - Bleeding grade III esophageal varices. Incompletely                        eradicated. Banded.                       - Subsequently treated with hemospray with stopping of                        active bleeding as evidenced by no further bright red                        blood seen previously upon re-examination after                        hemospray.                       - Pt. was transfused PRBCs during the procedure due to                        the active bleeding.                       - No specimens collected. Recommendation:       - Pt. will need evaluation for TIPS immediately as the                        EGD with banding and hemopspray has stopped the                        bleeding temporarily but is not a permanent solution.                       Recommend transfer to [facility] today for TIPS                       Above was discussed in detail with patient's family,  and Dr. Flora Lipps, ICU intensivist                       Continue Serial CBCs and transfuse PRN                       Keep patient intubated for airway protection                       Continue Octreotide drip                       Ceftriaxone due to GI Bleed and to prevent SBP Procedure Code(s):    --- Professional  ---                       [phone], Esophagoscopy, flexible, transoral; with band                        ligation of esophageal varices Diagnosis Code(s):    --- Professional ---                       I85.01, Esophageal varices with bleeding                       K92.0, Hematemesis CPT copyright 2017 American Medical Association. All rights reserved. The codes documented in this report are preliminary and upon coder review may  be revised to meet current compliance requirements.  [name], MD Margretta Sidle B. Bonna Gains MD, MD 06/15/2017 11:43:44 AM This report has been signed electronically. Number of Addenda: 0 Note Initiated On: 05/26/2017 9:40 AM      [facility]

## 2017-06-04 NOTE — Anesthesia Preprocedure Evaluation (Signed)
Anesthesia Evaluation  Patient identified by MRN, date of birth, ID band Patient awake    Reviewed: Allergy & Precautions, NPO status , Patient's Chart, lab work & pertinent test results, reviewed documented beta blocker date and time   Airway Mallampati: II  TM Distance: >3 FB     Dental  (+) Chipped   Pulmonary           Cardiovascular hypertension, Pt. on medications + CAD, + Past MI and + Cardiac Stents       Neuro/Psych  Neuromuscular disease    GI/Hepatic (+) Cirrhosis       ,   Endo/Other  diabetes, Type 2  Renal/GU CRFRenal disease     Musculoskeletal   Abdominal   Peds  Hematology  (+) anemia ,   Anesthesia Other Findings Receiving prbcs. Esophageal varices. Multiple myeloma. Syncope.  Reproductive/Obstetrics                             Anesthesia Physical Anesthesia Plan  ASA: IV  Anesthesia Plan: General   Post-op Pain Management:    Induction: Intravenous  PONV Risk Score and Plan:   Airway Management Planned:   Additional Equipment:   Intra-op Plan:   Post-operative Plan:   Informed Consent: I have reviewed the patients History and Physical, chart, labs and discussed the procedure including the risks, benefits and alternatives for the proposed anesthesia with the patient or authorized representative who has indicated his/her understanding and acceptance.     Plan Discussed with: CRNA  Anesthesia Plan Comments:         Anesthesia Quick Evaluation

## 2017-06-04 NOTE — Progress Notes (Signed)
Contacted by Dr. Mortimer Fries earlier this evening to start CRRT on patient given ARF, hyperkalemia.  NP Blak[place] updated that temporary dialysis catheter placed.  Will therefore proceed with CRRT using 2k bath and no UF at the moment.

## 2017-06-04 NOTE — Progress Notes (Signed)
[name], MD [address] [place], [place], [place], [address] [address], [place], [place], [place], [address] Phone: [phone]  Fax: [phone]   Subjective: Patient  underwent EGD today, see EGD report for findings.  Active GI bleeding from varices.  Condition remains guarded.  Patient intubated and sedated.   Objective: Exam: Vital signs in last 24 hours: Vitals:   05/22/2017 1300 06/13/2017 1400 05/27/2017 1500 06/15/2017 1600  BP: 93/75 93/72 (!) 86/70 (!) 77/51  Pulse: (!) 114 (!) 105 (!) 107 (!) 111  Resp: 13 13 14 11   Temp:    98 F (36.7 C)  TempSrc:    Oral  SpO2: 100% 100% 97% 97%  Weight:      Height:       Weight change:   Intake/Output Summary (Last 24 hours) at 05/26/2017 1618 Last data filed at 05/29/2017 1159 Gross per 24 hour  Intake 3866.17 ml  Output 1400 ml  Net 2466.17 ml    General: Intubated, sedated, no respiratory distress normal breathing effort.,  Abd: Soft, NT/ND, No HSM Skin: Warm, no rashes Neck: Supple, Trachea midline   Lab Results: Lab Results  Component Value Date   WBC 10.5 06/10/2017   HGB 8.3 (L) 06/06/2017   HCT 22.6 (L) 05/29/2017   MCV 85.3 06/06/2017   PLT 115 (L) 05/26/2017   Micro Results: Recent Results (from the past 240 hour(s))  MRSA PCR Screening     Status: None   Collection Time: 06/11/2017  3:01 AM  Result Value Ref Range Status   MRSA by PCR NEGATIVE NEGATIVE Final    Comment:        The GeneXpert MRSA Assay (FDA approved for NASAL specimens only), is one component of a comprehensive MRSA colonization surveillance program. It is not intended to diagnose MRSA infection nor to guide or monitor treatment for MRSA infections. Performed at [facility], [address] [place], [place] [address]    Studies/Results: Dg Chest Port 1 View  Result Date: 05/26/2017 CLINICAL DATA:  Status post intubation. EXAM: PORTABLE CHEST 1 VIEW COMPARISON:  01/20/2017. FINDINGS: ETT 3.6 cm  above carina. Cardiomegaly. Low lung volumes. No consolidation or edema. Calcified tortuous aorta. No osseous findings. IMPRESSION: ET tube in satisfactory position. Low lung volumes. Cardiomegaly without definite consolidation or edema. Electronically Signed   By: Staci Righter M.D.   On: 05/22/2017 15:04   Medications:  Scheduled Meds: . insulin aspart  0-15 Units Subcutaneous Q4H  . labetalol  5 mg Intravenous Once  . lactulose  10 g Oral QID  . pantoprazole (PROTONIX) IV  40 mg Intravenous Q12H  . rifaximin  550 mg Oral BID   Continuous Infusions: . sodium chloride    . cefTRIAXone (ROCEPHIN)  IV    . fentaNYL infusion INTRAVENOUS 300 mcg/hr (06/11/2017 1234)  . octreotide  (SANDOSTATIN)    IV infusion 50 mcg/hr (06/12/2017 0700)  . phenylephrine (NEO-SYNEPHRINE) Adult infusion 35 mcg/min (05/27/2017 1614)  .  sodium bicarbonate (isotonic) infusion in sterile water 100 mL/hr at 05/24/2017 1500   PRN Meds:.acetaminophen, bisacodyl, fentaNYL, midazolam, ondansetron **OR** ondansetron (ZOFRAN) IV, senna-docusate   Assessment: Active Problems:   Acute upper GI bleeding   Hematemesis with nausea   Secondary esophageal varices with bleeding ([place])    Plan: Patient's condition is guarded He has active GI bleeding, which was treated with banding, and hemo-spray After the procedure bleeding is stopped however, patient remains in critical condition, and needs TIPS I have discussed  this with the intensivist, who was contacted [facility] for transfer Transfer is pending Continue serial CBCs and transfuse PRN Continue octreotide drip Continue Protonix twice daily  I spent extensive amount of time talking to the patient's family, about his guarded condition, and the procedure findings and interventions.  They verbalized understanding.   LOS: 1 day   [name], MD 06/17/2017, 4:18 PM

## 2017-06-04 NOTE — Anesthesia Postprocedure Evaluation (Signed)
Anesthesia Post Note  Patient: Zachary Buck  Procedure(s) Performed: ESOPHAGOGASTRODUODENOSCOPY (EGD) (N/A )  Patient location during evaluation: ICU Anesthesia Type: General Level of consciousness: awake and alert Pain management: pain level controlled Vital Signs Assessment: post-procedure vital signs reviewed and stable Respiratory status: spontaneous breathing, nonlabored ventilation, respiratory function stable and patient connected to nasal cannula oxygen Cardiovascular status: blood pressure returned to baseline and stable Postop Assessment: no apparent nausea or vomiting Anesthetic complications: no     Last Vitals:  Vitals:   06/05/2017 1200 06/19/2017 1210  BP:    Pulse: (!) 114 (!) 113  Resp: 14 17  Temp:  (!) 36.4 C  SpO2: 100% 100%    Last Pain:  Vitals:   06/13/2017 1210  TempSrc: Oral  PainSc:                  Temperance Kelemen S

## 2017-06-04 NOTE — Anesthesia Post-op Follow-up Note (Signed)
Anesthesia QCDR form completed.        

## 2017-06-04 NOTE — Progress Notes (Addendum)
PULMONARY / CRITICAL CARE MEDICINE   Name: Zachary Buck MRN: [id number] DOB: 25-Mar-1940    ADMISSION DATE:  05/22/2017   CONSULTATION DATE:  06/04/2014  REFERRING MD:  Dr. 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  When EMS arrived, patient was hypotensive.  Upon arrival in the ED, he was hypotensive and his workup showed a hemoglobin of 8.0.  Patient states that he vomited about 500 cc with blood clots.  He was admitted to 2A but upon arrival and on the floor, he said he vomited about 700 cc of bright red blood, became hypotensive and had a near syncopal episode.  Rapid response was called and he was transferred to the ICU for further management.  His repeat CBC showed a hemoglobin of 6.2.  He is currently receiving a unit of packed red blood cells.  He is complaining of abdominal cramping and mild nausea.  Denies diarrhea and bloody stools  PAST MEDICAL HISTORY :  He  has a past medical history of Anemia, Atherosclerotic heart disease, CAD (coronary artery disease), Cirrhosis of liver ([place]), Clavicle fracture, Diabetes mellitus without complication ([place]), Elevated blood sugar, Esophageal varices (HCC), Hepatic encephalopathy ([place]), Hypercholesterolemia, Hypertension, Internal hemorrhoids, Leg fracture, Multiple myeloma ([place]), Myocardial infarct [facility]), Prostate enlargement, and Tubular adenoma of colon.  SUBJECTIVE: Patient with known esophageal varices admitted with hematemesis and symptomatic anemia.  Unable to scope yesterday due to hyperkalemia. Started on insulin infusion. Potassium level trending down. Episodes of mild hypoglycemia treated with D10. Hemoglobin trending down even post transfusion.  Three dark colored stools overnight. No hematemesis. Still c/o epigastric pain.   VITAL SIGNS: BP 129/88   Pulse 74   Temp 97.9 F (36.6 C) (Oral)   Resp 17   Ht 5' 9"  (1.753 m)   Wt 164 lb 7.4 oz (74.6 kg)   SpO2 96%   BMI 24.29 kg/m   HEMODYNAMICS:    VENTILATOR SETTINGS:    INTAKE / OUTPUT: I/O last 3 completed shifts: In: 1704.1 [I.V.:595.1; Blood:650; IV Piggyback:459] Out: 100 [Urine:100]  PHYSICAL EXAMINATION: General: Well-groomed, well-nourished, pleasant, in no acute distress Neuro: Alert and oriented x4, cranial nerves intact HEENT: PERRLA, no JVD Cardiovascular: Apical pulse regular, S1-S2, no murmur regurg or gallop, +2 pulses, no edema Lungs: Bilateral breath sounds without wheezes or rhonchi Abdomen: Nondistended, hypoactive bowel sounds in all 4 quadrants, palpation reveals epigastric tenderness and right upper quadrant tenderness, no referred rebound tenderness Musculoskeletal: Positive range of motion in upper and lower extremities, no joint deformities Skin: Pale, warm and dry  LABS:  BMET Recent Labs  Lab 06/15/2017 1227  06/13/2017 1903 06/01/2017 2239 06/16/2017 0337  NA 138  --   --  140 138  K 5.9*   < > 5.7* 5.1 5.1  CL 115*  --   --  116* 116*  CO2 17*  --   --  19* 18*  BUN 46*  --   --  51* 51*  CREATININE 1.79*  --   --  1.94* 1.97*  GLUCOSE 273*  --   --  87 114*   < > = values in this interval not displayed.    Electrolytes Recent Labs  Lab 06/01/2017 0734 06/19/2017 1227 06/05/2017 2239 06/11/2017 4742  CALCIUM 8.4* 8.4* 8.6* 8.3*  MG 1.2* 1.7  --   --   PHOS 3.9  --   --  3.8    CBC Recent Labs  Lab 06/11/2017 2244 06/15/2017 0226 06/13/2017 0734  06/10/2017 1903 05/30/2017 2239 05/31/2017 0337  WBC 11.1* 14.9* 10.5  --   --   --   --   HGB 8.0* 6.2* 7.8*   < > 7.5* 7.0* 7.2*  HCT 23.4* 18.6* 22.6*  --   --   --   --   PLT 146* 163 115*  --   --   --   --    < > = values in this interval not displayed.    Coag's Recent Labs  Lab  06/06/2017 2244 06/10/2017 0734  APTT 29  --   INR 1.30 1.37    Sepsis Markers No results for input(s): LATICACIDVEN, PROCALCITON, O2SATVEN in the last 168 hours.  ABG No results for input(s): PHART, PCO2ART, PO2ART in the last 168 hours.  Liver Enzymes Recent Labs  Lab 05/30/17 1120 05/24/2017 2244 06/10/2017 0734 05/27/2017 0337  AST 28 25 25   --   ALT 23 18 16*  --   ALKPHOS 78 59 51  --   BILITOT 1.1 0.7 0.8  --   ALBUMIN 3.1* 2.3* 2.2* 2.2*    Cardiac Enzymes No results for input(s): TROPONINI, PROBNP in the last 168 hours.  Glucose Recent Labs  Lab 06/16/2017 2318 06/06/2017 0009 06/15/2017 0131 06/17/2017 0242 05/21/2017 0355 06/17/2017 0619  GLUCAP 80 79 77 103* 107* 132*    Imaging No results found.   STUDIES:  Due for EGD today  CULTURES: None  ANTIBIOTICS: Ceftriaxone  SIGNIFICANT EVENTS: 4/13: Admitted with GI bleed  LINES/TUBES: Peripheral IV  DISCUSSION: 77 year old with a history of esophageal varices, liver cirrhosis and hepatic encephalopathy presenting with a recurrent GI bleed likely secondary to esophageal varices, near syncope and hypovolemic shock. GI notified of drop in hemoglobin and dark stools  ASSESSMENT  Acute upper GI bleed Near syncope Hypovolemic shock Acute blood loss anemia Hepatic encephalopathy Cryptogenic liver cirrhosis Type 2 diabetes mellitus Hyperkalemia  PLAN Hemodynamic monitoring per ICU protocol Octreotide and Protonix per GI IV fluids Insulin infusion at 1-75m/hr and titrate D10 to keep blood glucose>853mdl Transfuse packed red blood cells prn to keep hemoglobin 7-10 g/dl Trend CBC and transfuse for hemoglobin less than 7 Plan for endoscopy today per GI Blood glucose monitoring with sliding scale insulin coverage Keep n.p.o. Bedrest No pharmacologic  DVT prophylaxis SCDs for DVT prophylaxis Further changes in treatment plan pending clinical course and diagnostics  FAMILY  - Updates: Spouse and children  updated at bedside.  All questions answered  - Inter-disciplinary family meet or Palliative Care meeting due by: ay 7 El CastilloTuSutter Tracy Community HospitalNP-BC Pulmonary and Critical Care Medicine [facility] Centerager [phone]: This document was prepared using Dragon voice recognition software and may include unintentional dictation errors.   06/08/2017, 7:13 AM

## 2017-06-04 NOTE — Progress Notes (Addendum)
Pt has had three episodes of bloody stool, totaling 655mL of output. VSS. Patient remains alert and oriented. GI MD paged and notified.

## 2017-06-04 NOTE — Procedures (Signed)
Hemodialysis Catheter Insertion Procedure Note Zachary Buck [id number] 1940-06-05  Procedure: Insertion of Hemodialysis Catheter Indications: Dialysis Access   Procedure Details Consent: Risks of procedure as well as the alternatives and risks of each were explained to the (patient/caregiver).  Consent for procedure obtained. Time Out: Verified patient identification, verified procedure, site/side was marked, verified correct patient position, special equipment/implants available, medications/allergies/relevent history reviewed, required imaging and test results available.  Performed  Maximum sterile technique was used including antiseptics, cap, gloves, gown, hand hygiene, mask and sheet. Skin prep: Chlorhexidine; local anesthetic administered Double lumen hemodialysis catheter was inserted into right internal jugular vein using the Seldinger technique.  Evaluation Blood flow good Complications: No apparent complications Patient did tolerate procedure well. Chest X-ray ordered to verify placement.  CXR: pending.  Right internal jugular temporary dialysis catheter placed utilizing ultrasound no complications noted during or following procedure.  [name], [name] Pager [phone] (please enter 7 digits) PCCM Consult Pager [phone] (please enter 7 digits)

## 2017-06-04 NOTE — Progress Notes (Signed)
[facility] [place] NAME: Zachary Buck    MR#:  [id number]  DATE OF BIRTH:  1940/03/09  SUBJECTIVE:  CHIEF COMPLAINT:   Chief Complaint  Patient presents with  . GI Bleeding  Status post EGD, GI input greatly appreciated, recommending transfer to [facility] for TIPS procedure REVIEW OF SYSTEMS:  CONSTITUTIONAL: No fever, fatigue or weakness.  EYES: No blurred or double vision.  EARS, NOSE, AND THROAT: No tinnitus or ear pain.  RESPIRATORY: No cough, shortness of breath, wheezing or hemoptysis.  CARDIOVASCULAR: No chest pain, orthopnea, edema.  GASTROINTESTINAL: No nausea, vomiting, diarrhea or abdominal pain.  GENITOURINARY: No dysuria, hematuria.  ENDOCRINE: No polyuria, nocturia,  HEMATOLOGY: No anemia, easy bruising or bleeding SKIN: No rash or lesion. MUSCULOSKELETAL: No joint pain or arthritis.   NEUROLOGIC: No tingling, numbness, weakness.  PSYCHIATRY: No anxiety or depression.   ROS  DRUG ALLERGIES:   Allergies  Allergen Reactions  . Niaspan [Niacin Er] Other (See Comments)    Just does not want to take     VITALS:  Blood pressure (!) 74/51, pulse (!) 113, temperature (!) 97.5 F (36.4 C), temperature source Oral, resp. rate 17, height 5\' 9"  (1.753 m), weight 74.6 kg (164 lb 7.4 oz), SpO2 100 %.  PHYSICAL EXAMINATION:  GENERAL:  77 y.o.-year-old patient lying in the bed with no acute distress.  EYES: Pupils equal, round, reactive to light and accommodation. No scleral icterus. Extraocular muscles intact.  HEENT: Head atraumatic, normocephalic. Oropharynx and nasopharynx clear.  NECK:  Supple, no jugular venous distention. No thyroid enlargement, no tenderness.  LUNGS: Normal breath sounds bilaterally, no wheezing, rales,rhonchi or crepitation. No use of accessory muscles of respiration.  CARDIOVASCULAR: S1, S2 normal. No murmurs, rubs, or gallops.  ABDOMEN: Soft, nontender, nondistended. Bowel sounds present.  No organomegaly or mass.  EXTREMITIES: No pedal edema, cyanosis, or clubbing.  NEUROLOGIC: Cranial nerves II through XII are intact. Muscle strength 5/5 in all extremities. Sensation intact. Gait not checked.  PSYCHIATRIC: The patient is alert and oriented x 3.  SKIN: No obvious rash, lesion, or ulcer.   Physical Exam LABORATORY PANEL:   CBC Recent Labs  Lab 05/31/2017 0734  06/10/2017 0707  WBC 10.5  --   --   HGB 7.8*   < > 7.1*  HCT 22.6*  --   --   PLT 115*  --   --    < > = values in this interval not displayed.   ------------------------------------------------------------------------------------------------------------------  Chemistries  Recent Labs  Lab 05/27/2017 0734 05/21/2017 1227  06/09/2017 0707 05/30/2017 1124  NA 137 138   < > 138  --   K 5.9* 5.9*   < > 5.0 5.7*  CL 117* 115*   < > 117*  --   CO2 16* 17*   < > 17*  --   GLUCOSE 267* 273*   < > 154*  --   BUN 43* 46*   < > 54*  --   CREATININE 1.64* 1.79*   < > 2.02*  --   CALCIUM 8.4* 8.4*   < > 8.2*  --   MG 1.2* 1.7  --   --   --   AST 25  --   --   --   --   ALT 16*  --   --   --   --   ALKPHOS 51  --   --   --   --  BILITOT 0.8  --   --   --   --    < > = values in this interval not displayed.   ------------------------------------------------------------------------------------------------------------------  Cardiac Enzymes No results for input(s): TROPONINI in the last 168 hours. ------------------------------------------------------------------------------------------------------------------  RADIOLOGY:  No results found.  ASSESSMENT AND PLAN:  A/P: 77M UGIB, 2/2 esophageal varices.  1.) UGIB/symptomatic acute blood loss anemia Secondary to esophageal varices Present with w/ syncope, hypotension, hemodynamic instability Status post EGD by gastroenterology today, noted esophageal variceal bleeding/required transfusion during procedure/status post banding, per gastroenterology-recommended  transfer to [facility] tertiary facility for TIPS procedure/further medical management, continue ICU care, Octreotide/Protonix drips, ceftriaxone for prevention of SBP  2.) Hyperkalemia Possibly 2/2 intravascular volume depletion IV fluids for rehydration, hold ACE-I  3.) Hyperglycemia with history of IDDM Stable on current regiment  4.) CKD Stable Avoid nephrotoxic agents, strict I&O monitoring, daily weights  5.) NAFLD/Cirrhosis Stable Continue Lactulose, Rifaximin, and Aldactone  6.) HTN Stable  7.) HLD/CAD/MI Stable    All the records are reviewed and case discussed with Care Management/Social Workerr. Management plans discussed with the patient, family and they are in agreement.  CODE STATUS:   TOTAL TIME TAKING CARE OF THIS PATIENT: 35 minutes.     POSSIBLE D/C IN 1-2 DAYS, DEPENDING ON CLINICAL CONDITION.   77 y.o. Zachary Buck Salary M.D on 06/01/2017   Between 7am to 6pm - Pager - [phone]  After 6pm go to [email or web address] - password [email or web address] [facility]  Office  [phone]  CC: Primary care physician; Einar Pheasant, MD  Note: This dictation was prepared with Dragon dictation along with smaller phrase technology. Any transcriptional errors that result from this process are unintentional.

## 2017-06-04 NOTE — Progress Notes (Signed)
  I have discussed case with GI.  They recommend STAT transfer to [facility] for emergent TIPS procedure  I have called IR department here at [facility], they do NOT do this procedure.  I have contacted [facility] and [facility] and they will find a bed ASAP!    Corrin Parker, M.D.  [facility] Pulmonary & Critical Care Medicine  Medical Director [place] Director [facility] Cardio-Pulmonary Department

## 2017-06-04 NOTE — Transfer of Care (Signed)
Immediate Anesthesia Transfer of Care Note  Patient: Zachary Buck  Procedure(s) Performed: ESOPHAGOGASTRODUODENOSCOPY (EGD) (N/A )  Patient Location: ICU  Anesthesia Type:General  Level of Consciousness: sedated  Airway & Oxygen Therapy: Patient remains intubated per anesthesia plan  Post-op Assessment: Report given to RN and Post -op Vital signs reviewed and stable  Post vital signs: stable  Last Vitals:  Vitals Value Taken Time  BP 119/72 06/12/2017 11:18 AM  Temp    Pulse 131 06/17/2017 11:20 AM  Resp 29 05/21/2017 11:20 AM  SpO2 99 % 05/27/2017 11:20 AM  Vitals shown include unvalidated device data.  Last Pain:  Vitals:   06/05/2017 0715  TempSrc: Oral  PainSc:       Patients Stated Pain Goal: 2 (81/01/75 1025)  Complications: No apparent anesthesia complications

## 2017-06-04 NOTE — Progress Notes (Signed)
PULMONARY / CRITICAL CARE MEDICINE   Name: MUBASHIR MALLEK MRN: [id number] DOB: 1940-09-06    ADMISSION DATE:  06/08/2017   CONSULTATION DATE:  06/04/2014  REFERRING MD:  Dr. 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  When EMS arrived, patient was hypotensive.  Upon arrival in the ED, he was hypotensive and his workup showed a hemoglobin of 8.0.  Patient states that he vomited about 500 cc with blood clots.  He was admitted to 2A but upon arrival and on the floor, he said he vomited about 700 cc of bright red blood, became hypotensive and had a near syncopal episode.  Rapid response was called and he was transferred to the ICU for further management.  His repeat CBC showed a hemoglobin of 6.2.  He is currently receiving a unit of packed red blood cells.  He is complaining of abdominal cramping and mild nausea.  Denies diarrhea and bloody stools  PAST MEDICAL HISTORY :  He  has a past medical history of Anemia, Atherosclerotic heart disease, CAD (coronary artery disease), Cirrhosis of liver ([place]), Clavicle fracture, Diabetes mellitus without complication ([place]), Elevated blood sugar, Esophageal varices (HCC), Hepatic encephalopathy ([place]), Hypercholesterolemia, Hypertension, Internal hemorrhoids, Leg fracture, Multiple myeloma ([place]), Myocardial infarct [facility]), Prostate enlargement, and Tubular adenoma of colon.  SUBJECTIVE:   Patient with known esophageal varices admitted with hematemesis and symptomatic anemia.  Unable to scope yesterday due to hyperkalemia. Started on insulin infusion. Potassium level trending down. Episodes of mild hypoglycemia treated with D10. Hemoglobin trending down even post  transfusion. Three dark colored stools overnight. No hematemesis. Still c/o epigastric pain.   Patient with active GIb with hypovolumic shock with severe anemia Was given 3 PRBC's Needs IVF's and start vasorpessors  VITAL SIGNS: BP 129/88   Pulse 71   Temp 97.6 F (36.4 C) (Oral)   Resp 15   Ht 5' 9"  (1.753 m)   Wt 164 lb 7.4 oz (74.6 kg)   SpO2 97%   BMI 24.29 kg/m   HEMODYNAMICS:    VENTILATOR SETTINGS:    INTAKE / OUTPUT: I/O last 3 completed shifts: In: 2944.3 [I.V.:1835.3; Blood:650; IV Piggyback:459] Out: 100 [Urine:100] PHYSICAL EXAMINATION: General: ill appearing Neuro: Alert and oriented x4, cranial nerves intact HEENT: PERRLA, no JVD Cardiovascular: Apical pulse regular, S1-S2, no murmur regurg or gallop, +2 pulses, no edema Lungs: Bilateral breath sounds without wheezes or rhonchi Abdomen: Nondistended, hypoactive bowel sounds in all 4 quadrants, palpation reveals epigastric tenderness and right upper quadrant tenderness, no referred rebound tenderness Musculoskeletal: Positive range of motion in upper and lower extremities, no joint deformities Skin: Pale, warm and dry   LABS:  BMET Recent Labs  Lab 05/27/2017 2239 05/26/2017 0337 05/21/2017 0707  NA 140 138 138  K 5.1 5.1 5.0  CL 116* 116* 117*  CO2 19* 18* 17*  BUN 51* 51* 54*  CREATININE 1.94* 1.97* 2.02*  GLUCOSE 87 114* 154*    Electrolytes Recent Labs  Lab 06/05/2017 0734 06/12/2017 1227 06/13/2017 2239 06/14/2017 0337 06/01/2017 0707  CALCIUM 8.4* 8.4* 8.6* 8.3* 8.2*  MG 1.2* 1.7  --   --   --   PHOS 3.9  --   --  3.8  --  CBC Recent Labs  Lab 05/21/2017 2244 05/30/2017 0226 05/22/2017 0734  05/29/2017 2239 05/25/2017 0337 05/30/2017 0707  WBC 11.1* 14.9* 10.5  --   --   --   --   HGB 8.0* 6.2* 7.8*   < > 7.0* 7.2* 7.1*  HCT 23.4* 18.6* 22.6*  --   --   --   --   PLT 146* 163 115*  --   --   --   --    < > = values in this interval not displayed.    Coag's Recent Labs  Lab  06/05/2017 2244 06/12/2017 0734  APTT 29  --   INR 1.30 1.37    Sepsis Markers No results for input(s): LATICACIDVEN, PROCALCITON, O2SATVEN in the last 168 hours.  ABG No results for input(s): PHART, PCO2ART, PO2ART in the last 168 hours.  Liver Enzymes Recent Labs  Lab 05/30/17 1120 05/26/2017 2244 05/22/2017 0734 06/18/2017 0337  AST 28 25 25   --   ALT 23 18 16*  --   ALKPHOS 78 59 51  --   BILITOT 1.1 0.7 0.8  --   ALBUMIN 3.1* 2.3* 2.2* 2.2*    Cardiac Enzymes No results for input(s): TROPONINI, PROBNP in the last 168 hours.  Glucose Recent Labs  Lab 06/19/2017 0131 06/08/2017 0242 06/06/2017 0355 06/01/2017 0619 06/15/2017 0811 06/18/2017 0912  GLUCAP 77 103* 107* 132* 184* 266*    Imaging No results found.   STUDIES:  Due for EGD today  CULTURES: None  ANTIBIOTICS: Ceftriaxone  SIGNIFICANT EVENTS: 4/13: Admitted with GI bleed  LINES/TUBES: Peripheral IV  DISCUSSION: 77 year old with a history of esophageal varices, liver cirrhosis and hepatic encephalopathy presenting with a recurrent GI bleed likely secondary to esophageal varices, near syncope and severe hypovolemic shock. GI notified of drop in hemoglobin and dark stools, severe anemia  ASSESSMENT  Acute upper GI bleed Near syncope Hypovolemic shock Acute blood loss anemia Hepatic encephalopathy Cryptogenic liver cirrhosis Type 2 diabetes mellitus Hyperkalemia  PLAN Hemodynamic monitoring per ICU protocol Octreotide and Protonix per GI IV fluids Insulin infusion at 1-88m/hr and titrate D10 to keep blood glucose>856mdl Transfuse packed red blood cells prn to keep hemoglobin 7-10 g/dl Trend CBC and transfuse for hemoglobin less than 7 Plan for endoscopy today per GI Blood glucose monitoring with sliding scale insulin coverage Keep n.p.o. Bedrest No pharmacologic  DVT prophylaxis SCDs for DVT prophylaxis Further changes in treatment plan pending clinical course and diagnostics  FAMILY  -  Updates: Spouse and children updated at bedside.  All questions answered  - Inter-disciplinary family meet or Palliative Care meeting due by: ay 7 Fern AcresTuSummit Pacific Medical CenterNP-BC Pulmonary and Critical Care Medicine LeALPine Surgery Centerager [phone]: This document was prepared using Dragon voice recognition software and may include unintentional dictation errors.   06/01/2017, 9:25 AM    STAFF NOTE: I, Dr. KuCorrin Parker have personally reviewed patient's available data, including medical history, events of note, physical examination and test results as part of my evaluation. I have discussed with NP and other care providers such as pharmacist, RN and RRT.  In addition,  I personally evaluated patient and elicited key findings   Admitted for severe GIB with Severe Hypovolumic shock Patient is critically ill Ill-appearing  Plan for blood transfusions PRBC's IVF's Vasorpessors   Critical Care Time devoted to patient care services described in this note is 45 minutes.   This Critical care time does not reflrect procedure  time or supervisory time of NP but could involve care discussion time  Overall, patient is critically ill, prognosis is guarded.  high risk for cardiac arrest and death.    Corrin Parker, M.D.  [facility] Pulmonary & Critical Care Medicine  Medical Director [place] Director [facility] Cardio-Pulmonary Department

## 2017-06-04 NOTE — Progress Notes (Addendum)
After further assessment and evaluation, patient is with multiorgan failure and severe acidosis.  I have discussed grave prognosis, [facility] has NOT accepted patient due to fact that TIPS is contraindicated at his age and that is very high risk. Patient is not a candidate for IR procedures due to contrast dye load.  AT this time, the family has consented and agreed to DNR status. The family has also agreed and consented NOT to transfer patient to [facility].  I have also addressed possible need for dialysis with underlying severe acidosis and hyerkalemia, the family also has agreed and consented to NO hemodialysis at this time.   At this time, we will need to assess patient clinical status closely. I will place CVL for IV access.      Family are satisfied with Plan of action and management. All questions answered  Corrin Parker, M.D.  [facility] Pulmonary & Critical Care Medicine  Medical Director [place] Director [facility] Cardio-Pulmonary Department

## 2017-06-04 NOTE — Anesthesia Procedure Notes (Signed)
Procedure Name: Intubation Date/Time: 06/08/2017 9:59 AM Performed by: Willette Alma, CRNA Pre-anesthesia Checklist: Patient identified, Patient being monitored, Timeout performed, Emergency Drugs available and Suction available Patient Re-evaluated:Patient Re-evaluated prior to induction Oxygen Delivery Method: Circle system utilized Preoxygenation: Pre-oxygenation with 100% oxygen Induction Type: IV induction Laryngoscope Size: Mac and 3 Grade View: Grade I Tube type: Oral Tube size: 7.0 mm Number of attempts: 1 Airway Equipment and Method: Stylet and Video-laryngoscopy Placement Confirmation: ETT inserted through vocal cords under direct vision,  positive ETCO2 and breath sounds checked- equal and bilateral Secured at: 21 cm Tube secured with: Tape Dental Injury: Teeth and Oropharynx as per pre-operative assessment

## 2017-06-04 NOTE — Procedures (Signed)
Central Venous Catheter Placement:TRIPLE LUMEN Indication: Patient receiving vesicant or irritant drug.; Patient receiving intravenous therapy for longer than 5 days.; Patient has limited or no vascular access.   Consent:emergent    Hand washing performed prior to starting the procedure.   Procedure:   An active timeout was performed and correct patient, name, & ID confirmed.   Patient was positioned correctly for central venous access.  Patient was prepped using strict sterile technique including chlorohexadine preps, sterile drape, sterile gown and sterile gloves.    The area was prepped, draped and anesthetized in the usual sterile manner. Patient comfort was obtained.    A triple lumen catheter was placed in LEFT Internal Jugular Vein There was good blood return, catheter caps were placed on lumens, catheter flushed easily, the line was secured and a sterile dressing and BIO-PATCH applied.   Ultrasound was used to visualize vasculature and guidance of needle.   Number of Attempts: 1 Complications:none Estimated Blood Loss: none Chest Radiograph indicated and ordered.  Operator: Zaion Hreha.   Ame Heagle David Talullah Abate, M.D.  [facility]  Medical Director ICU-[facility] Medical Director [facility] Department     

## 2017-06-04 NOTE — Progress Notes (Signed)
I have called [facility] for referral, the ICU attending and GI attending call back and had frank conversation, they reviewed case with me and GI's procedure note here.  At this time, it seems that aggressive banding was the ideal procedure at this time and TIPS will NOT likely help this patient and may even cause harm. There is IR procedures that can be performed however with patients acute renal failure and dye load, patient may worsen renal function and acidosis.  Overall, prognosis is poor. They recommend NOT transfer patient and to stabilize BP and acidosis.  I will relay this Info to family. [facility] has NOT accepted patient.  Will confirm with [facility] on Bed availability   Corrin Parker, M.D.  [facility]  Medical Director [place] Director [place] Department

## 2017-06-04 NOTE — Progress Notes (Signed)
Inpatient Diabetes Program Recommendations  AACE/ADA: New Consensus Statement on Inpatient Glycemic Control (2015)  Target Ranges:  Prepandial:   less than 140 mg/dL      Peak postprandial:   less than 180 mg/dL (1-2 hours)      Critically ill patients:  140 - 180 mg/dL   Lab Results  Component Value Date   GLUCAP 330 (H) 06/06/2017   HGBA1C 8.3 (H) 01/19/2017    Review of Glycemic ControlResults for REYES, ALDACO (MRN [id number]) as of 06/06/2017 13:12  Ref. Range 06/13/2017 08:11 06/10/2017 09:12 05/25/2017 13:04  Glucose-Capillary Latest Ref Range: 65 - 99 mg/dL 184 (H) 266 (H) 330 (H)    Diabetes history: Type 2 DM Outpatient Diabetes medications: Lantus 16 units daily, Glucotrol 5 mg daily Current orders for Inpatient glycemic control:  Novolog moderate q 4 hours  Inpatient Diabetes Program Recommendations:    Note that blood sugar trending up.  If appropriate, consider adding Lantus 10 units daily and continue Novolog moderate q 4 hours.  If blood sugars continue to increase, consider ICU glycemic control order set/ IV insulin to keep blood sugars 140-180 mg/dL.   Thanks,  Adah Perl, RN, BC-ADM Inpatient Diabetes Coordinator Pager [phone] (8a-5p)

## 2017-06-04 NOTE — H&P (Signed)
[name], MD [address], [place], [place], [place], [address] [address], [place], [place], [place], [address] Phone: [phone]  Fax: [phone]  Primary Care Physician:  Einar Pheasant, MD   Pre-Procedure History & Physical: HPI:  Zachary Buck is a 77 y.o. male is here for an EGD.   Past Medical History:  Diagnosis Date  . Anemia   . Atherosclerotic heart disease    s/p MI, s/p stent mid circumflex  . CAD (coronary artery disease)    Dr Nehemiah Massed, cardiologist  . Cirrhosis of liver ([place])   . Clavicle fracture   . Diabetes mellitus without complication ([place])    oral med  . Elevated blood sugar    02/03/16 and 02/09/16 had BS over 400.  otherwise running around 250  . Esophageal varices ([place])   . Hepatic encephalopathy ([place])   . Hypercholesterolemia   . Hypertension    controlled on meds  . Internal hemorrhoids   . Leg fracture   . Multiple myeloma ([place])   . Myocardial infarct ([place])   . Prostate enlargement   . Tubular adenoma of colon     Past Surgical History:  Procedure Laterality Date  . COLONOSCOPY WITH PROPOFOL N/A 03/04/2015   Procedure: COLONOSCOPY WITH PROPOFOL;  Surgeon: Jerene Bears, MD;  Location: [facility];  Service: Gastroenterology;  Laterality: N/A;  . COLONOSCOPY WITH PROPOFOL N/A 12/09/2015   Procedure: COLONOSCOPY WITH PROPOFOL;  Surgeon: [name], MD;  Location: [facility];  Service: Endoscopy;  Laterality: N/A;  DIABETIC-ORAL MEDS  . CORONARY ANGIOPLASTY WITH STENT PLACEMENT     2 stents  . ESOPHAGOGASTRODUODENOSCOPY (EGD) WITH PROPOFOL N/A 03/04/2015   Procedure: ESOPHAGOGASTRODUODENOSCOPY (EGD) WITH PROPOFOL;  Surgeon: Jerene Bears, MD;  Location: [facility];  Service: Gastroenterology;  Laterality: N/A;  . ESOPHAGOGASTRODUODENOSCOPY (EGD) WITH PROPOFOL N/A 12/09/2015   Procedure: ESOPHAGOGASTRODUODENOSCOPY (EGD) WITH PROPOFOL;  Surgeon: [name], MD;  Location: [facility];  Service:  Endoscopy;  Laterality: N/A;  . ESOPHAGOGASTRODUODENOSCOPY (EGD) WITH PROPOFOL N/A 12/21/2015   Procedure: ESOPHAGOGASTRODUODENOSCOPY (EGD) WITH PROPOFOL;  Surgeon: [name], MD;  Location: [facility];  Service: Endoscopy;  Laterality: N/A;  . ESOPHAGOGASTRODUODENOSCOPY (EGD) WITH PROPOFOL N/A 02/24/2016   Procedure: ESOPHAGOGASTRODUODENOSCOPY (EGD) WITH PROPOFOL;  Surgeon: [name], MD;  Location: [facility];  Service: Endoscopy;  Laterality: N/A;  diabetic - oral med  . POLYPECTOMY N/A 12/09/2015   Procedure: POLYPECTOMY;  Surgeon: [name], MD;  Location: [facility];  Service: Endoscopy;  Laterality: N/A;  . TONSILLECTOMY      Prior to Admission medications   Medication Sig Start Date End Date Taking? Authorizing Provider  Alpha-Lipoic Acid (LIPOIC ACID PO) Take 200 mg by mouth daily.    Yes [provider]  Ascorbic Acid (VITAMIN C) 1000 MG tablet Take 1,000 mg by mouth daily.   Yes [provider]  B Complex Vitamins (VITAMIN B COMPLEX) TABS Take by mouth daily.   Yes [provider]  blood glucose meter kit and supplies KIT Dispense based on patient and insurance preference. Use up to four times daily as directed. E11.9 Please dispense Freestyle freedom meter and supplies. 09/09/15  Yes Cook, Jayce G, DO  Coenzyme Q10 100 MG capsule Take 100 mg by mouth daily.    Yes [provider]  furosemide (LASIX) 20 MG tablet Take 1 tablet (20 mg total) by mouth 2 (two) times daily. Patient taking differently: Take 20 mg by mouth as needed.  02/08/17  Yes [name], MD  glipiZIDE (GLUCOTROL) 5 MG tablet Take 1 tablet (5 mg total) by mouth See admin instructions. 10/02/16  Yes Einar Pheasant, MD  glucose blood (FREESTYLE LITE) test strip USE TO CHECK BLOOD SUGAR THREE TIMES A DAY 05/14/17  Yes Einar Pheasant, MD  glucose blood (ONE TOUCH ULTRA TEST) test strip Use as instructed 11/18/15  Yes Einar Pheasant, MD  insulin glargine  (LANTUS) 100 unit/mL SOPN Inject 0.1 mLs (10 Units total) into the skin at bedtime. Patient taking differently: Inject 16 Units into the skin at bedtime.  11/14/16  Yes Einar Pheasant, MD  Insulin Pen Needle (PEN NEEDLES) 31G X 6 MM MISC 1 application by Does not apply route 4 (four) times daily. 11/10/16  Yes Einar Pheasant, MD  lactulose (CHRONULAC) 10 GM/15ML solution Take 30 mLs (20 g total) by mouth 4 (four) times daily. 05/04/17  Yes [name], MD  Lancets [facility] ULTRASOFT) lancets Use as instructed 11/01/16  Yes Einar Pheasant, MD  lisinopril (PRINIVIL,ZESTRIL) 20 MG tablet TAKE ONE TABLET BY MOUTH EVERY DAY/ AM 01/11/15  Yes [provider]  MILK THISTLE PO Take 25 mg by mouth daily.   Yes [provider]  NON FORMULARY Take 1 tablet by mouth daily. burburan   Yes [provider]  Omega-3 Fatty Acids (FISH OIL) 1000 MG CAPS Take 4,000 mg by mouth daily.    Yes [provider]  rifaximin (XIFAXAN) 550 MG TABS tablet Take 1 tablet (550 mg total) by mouth 2 (two) times daily. 07/31/16  Yes [name], MD  Syringe/Needle, Disp, 30G X 1/2" 1 ML MISC 1 Syringe by Does not apply route as directed. 11/01/16  Yes Einar Pheasant, MD  Theanine 100 MG CAPS Take by mouth daily.   Yes [provider]  triamcinolone cream (KENALOG) 0.1 % Apply topically 2 (two) times daily. Please avoid face and genitalia area.  Do not use in the same spot for more than 7-10 days in a row 05/18/17  Yes Einar Pheasant, MD  Turmeric POWD 4,000 mg by Does not apply route daily.   Yes [provider]  Cinnamon 500 MG capsule Take 500 mg by mouth.    [provider]  sildenafil (REVATIO) 20 MG tablet 3 to 5 tablets per day or as instructed 09/01/15   [provider]  spironolactone (ALDACTONE) 50 MG tablet Take 1 tablet (50 mg total) by mouth daily. Patient not taking: Reported on 05/29/2017 02/08/17   [name], MD  traMADol (ULTRAM) 50 MG tablet  Take 1 tablet (50 mg total) by mouth every 6 (six) hours as needed. Patient not taking: Reported on 06/09/2017 01/20/17 01/20/18  Nance Pear, MD  zinc sulfate 220 (50 Zn) MG capsule Take 1 capsule (220 mg total) by mouth 2 (two) times daily. Patient not taking: Reported on 06/13/2017 10/03/16   [name], MD    Allergies as of 05/24/2017 - Review Complete 05/31/2017  Allergen Reaction Noted  . Niaspan [niacin er] Other (See Comments) 03/19/2012    Family History  Problem Relation Age of Onset  . Aneurysm Father 6       of the heart  . Heart attack Father   . Diabetes Mother     Social History   Socioeconomic History  . Marital status: Married    Spouse name: Not on file  . Number of children: 2  . Years of education: Not on file  . Highest education level: Not on file  Occupational  History  . Occupation: Engineer, building services: [employer]  Social Needs  . Financial resource strain: Not on file  . Food insecurity:    Worry: Not on file    Inability: Not on file  . Transportation needs:    Medical: Not on file    Non-medical: Not on file  Tobacco Use  . Smoking status: Never Smoker  . Smokeless tobacco: Never Used  Substance and Sexual Activity  . Alcohol use: No    Alcohol/week: 0.0 oz  . Drug use: No  . Sexual activity: Not on file  Lifestyle  . Physical activity:    Days per week: Not on file    Minutes per session: Not on file  . Stress: Not on file  Relationships  . Social connections:    Talks on phone: Not on file    Gets together: Not on file    Attends religious service: Not on file    Active member of club or organization: Not on file    Attends meetings of clubs or organizations: Not on file    Relationship status: Not on file  . Intimate partner violence:    Fear of current or ex partner: Not on file    Emotionally abused: Not on file    Physically abused: Not on file    Forced sexual activity: Not on file  Other Topics Concern  . Not on file    Social History Narrative  . Not on file    Review of Systems: See HPI, otherwise negative ROS  Physical Exam: BP 129/88   Pulse 71   Temp 97.6 F (36.4 C) (Oral)   Resp 15   Ht _0  (1.753 m)   Wt 164 lb 7.4 oz (74.6 kg)   SpO2 97%   BMI 24.29 kg/m  General:   Alert,  pleasant and cooperative in NAD Head:  Normocephalic and atraumatic. Neck:  Supple; no masses or thyromegaly. Lungs:  Clear throughout to auscultation, normal respiratory effort.    Heart:  +S1, +S2, Regular rate and rhythm, No edema. Abdomen:  Soft, nontender and nondistended. Normal bowel sounds, without guarding, and without rebound.   Neurologic:  Alert and  oriented x4;  grossly normal neurologically.  Impression/Plan: Zachary Buck is here for an EGD for hematemesis  Risks, benefits, limitations, and alternatives regarding the procedure have been reviewed with the patient.  Questions have been answered.  All parties agreeable.   Virgel Manifold, MD  06/09/2017, 9:40 AM

## 2017-06-05 ENCOUNTER — Telehealth: Payer: Self-pay | Admitting: Gastroenterology

## 2017-06-05 DIAGNOSIS — E872 Acidosis: Secondary | ICD-10-CM

## 2017-06-05 DIAGNOSIS — J9601 Acute respiratory failure with hypoxia: Secondary | ICD-10-CM

## 2017-06-05 LAB — CBC WITH DIFFERENTIAL/PLATELET
BAND NEUTROPHILS: 0 %
BLASTS: 0 %
Basophils Absolute: 0 10*3/uL (ref 0–0.1)
Basophils Relative: 0 %
EOS PCT: 0 %
Eosinophils Absolute: 0 10*3/uL (ref 0–0.7)
HEMATOCRIT: 27.5 % — AB (ref 40.0–52.0)
Hemoglobin: 9.1 g/dL — ABNORMAL LOW (ref 13.0–18.0)
Lymphocytes Relative: 6 %
Lymphs Abs: 2 10*3/uL (ref 1.0–3.6)
MCH: 28.9 pg (ref 26.0–34.0)
MCHC: 33 g/dL (ref 32.0–36.0)
MCV: 87.6 fL (ref 80.0–100.0)
METAMYELOCYTES PCT: 0 %
MONOS PCT: 9 %
Monocytes Absolute: 3 10*3/uL — ABNORMAL HIGH (ref 0.2–1.0)
Myelocytes: 0 %
NEUTROS ABS: 28.7 10*3/uL — AB (ref 1.4–6.5)
Neutrophils Relative %: 85 %
Other: 0 %
Platelets: 93 10*3/uL — ABNORMAL LOW (ref 150–440)
Promyelocytes Relative: 0 %
RBC: 3.14 MIL/uL — AB (ref 4.40–5.90)
RDW: 16.9 % — AB (ref 11.5–14.5)
WBC: 33.7 10*3/uL — ABNORMAL HIGH (ref 3.8–10.6)
nRBC: 7 /100 WBC — ABNORMAL HIGH

## 2017-06-05 LAB — TYPE AND SCREEN
ABO/RH(D): A POS
Antibody Screen: NEGATIVE
UNIT DIVISION: 0
UNIT DIVISION: 0
Unit division: 0
Unit division: 0
Unit division: 0

## 2017-06-05 LAB — BLOOD GAS, ARTERIAL
ACID-BASE DEFICIT: 8.9 mmol/L — AB (ref 0.0–2.0)
BICARBONATE: 18.4 mmol/L — AB (ref 20.0–28.0)
FIO2: 30
O2 Saturation: 97.2 %
PEEP: 5 cmH2O
PH ART: 7.22 — AB (ref 7.350–7.450)
Patient temperature: 37
RATE: 15 resp/min
VT: 500 mL
pCO2 arterial: 45 mmHg (ref 32.0–48.0)
pO2, Arterial: 110 mmHg — ABNORMAL HIGH (ref 83.0–108.0)

## 2017-06-05 LAB — RENAL FUNCTION PANEL
ALBUMIN: 1.9 g/dL — AB (ref 3.5–5.0)
ALBUMIN: 1.7 g/dL — AB (ref 3.5–5.0)
ALBUMIN: 1.6 g/dL — AB (ref 3.5–5.0)
ANION GAP: 5 (ref 5–15)
ANION GAP: 8 (ref 5–15)
ANION GAP: 10 (ref 5–15)
Albumin: 1.8 g/dL — ABNORMAL LOW (ref 3.5–5.0)
Albumin: 1.5 g/dL — ABNORMAL LOW (ref 3.5–5.0)
Albumin: 1.5 g/dL — ABNORMAL LOW (ref 3.5–5.0)
Anion gap: 7 (ref 5–15)
Anion gap: 10 (ref 5–15)
Anion gap: 9 (ref 5–15)
BUN: 43 mg/dL — ABNORMAL HIGH (ref 6–20)
BUN: 37 mg/dL — ABNORMAL HIGH (ref 6–20)
BUN: 31 mg/dL — ABNORMAL HIGH (ref 6–20)
BUN: 30 mg/dL — AB (ref 6–20)
BUN: 28 mg/dL — AB (ref 6–20)
BUN: 27 mg/dL — AB (ref 6–20)
CALCIUM: 6.7 mg/dL — AB (ref 8.9–10.3)
CALCIUM: 6.5 mg/dL — AB (ref 8.9–10.3)
CHLORIDE: 109 mmol/L (ref 101–111)
CHLORIDE: 107 mmol/L (ref 101–111)
CO2: 18 mmol/L — ABNORMAL LOW (ref 22–32)
CO2: 19 mmol/L — ABNORMAL LOW (ref 22–32)
CO2: 17 mmol/L — ABNORMAL LOW (ref 22–32)
CO2: 18 mmol/L — ABNORMAL LOW (ref 22–32)
CO2: 17 mmol/L — AB (ref 22–32)
CO2: 20 mmol/L — AB (ref 22–32)
CREATININE: 2.28 mg/dL — AB (ref 0.61–1.24)
CREATININE: 2.05 mg/dL — AB (ref 0.61–1.24)
Calcium: 7.7 mg/dL — ABNORMAL LOW (ref 8.9–10.3)
Calcium: 7.2 mg/dL — ABNORMAL LOW (ref 8.9–10.3)
Calcium: 6.9 mg/dL — ABNORMAL LOW (ref 8.9–10.3)
Calcium: 7.1 mg/dL — ABNORMAL LOW (ref 8.9–10.3)
Chloride: 112 mmol/L — ABNORMAL HIGH (ref 101–111)
Chloride: 109 mmol/L (ref 101–111)
Chloride: 111 mmol/L (ref 101–111)
Chloride: 108 mmol/L (ref 101–111)
Creatinine, Ser: 2.62 mg/dL — ABNORMAL HIGH (ref 0.61–1.24)
Creatinine, Ser: 2.11 mg/dL — ABNORMAL HIGH (ref 0.61–1.24)
Creatinine, Ser: 2.16 mg/dL — ABNORMAL HIGH (ref 0.61–1.24)
Creatinine, Ser: 2.14 mg/dL — ABNORMAL HIGH (ref 0.61–1.24)
GFR calc Af Amer: 25 mL/min — ABNORMAL LOW (ref >60)
GFR calc Af Amer: 34 mL/min — ABNORMAL LOW (ref >60)
GFR calc Af Amer: 33 mL/min — ABNORMAL LOW (ref >60)
GFR calc Af Amer: 32 mL/min — ABNORMAL LOW (ref >60)
GFR calc Af Amer: 33 mL/min — ABNORMAL LOW (ref >60)
GFR calc non Af Amer: 30 mL/min — ABNORMAL LOW (ref >60)
GFR calc non Af Amer: 28 mL/min — ABNORMAL LOW (ref >60)
GFR calc non Af Amer: 28 mL/min — ABNORMAL LOW (ref >60)
GFR, EST AFRICAN AMERICAN: 30 mL/min — AB (ref >60)
GFR, EST NON AFRICAN AMERICAN: 22 mL/min — AB (ref >60)
GFR, EST NON AFRICAN AMERICAN: 26 mL/min — AB (ref >60)
GFR, EST NON AFRICAN AMERICAN: 29 mL/min — AB (ref >60)
GLUCOSE: 100 mg/dL — AB (ref 65–99)
GLUCOSE: 65 mg/dL (ref 65–99)
Glucose, Bld: 115 mg/dL — ABNORMAL HIGH (ref 65–99)
Glucose, Bld: 109 mg/dL — ABNORMAL HIGH (ref 65–99)
Glucose, Bld: 81 mg/dL (ref 65–99)
Glucose, Bld: 109 mg/dL — ABNORMAL HIGH (ref 65–99)
PHOSPHORUS: 5.6 mg/dL — AB (ref 2.5–4.6)
POTASSIUM: 4.7 mmol/L (ref 3.5–5.1)
POTASSIUM: 3.9 mmol/L (ref 3.5–5.1)
POTASSIUM: 4.3 mmol/L (ref 3.5–5.1)
POTASSIUM: 4.5 mmol/L (ref 3.5–5.1)
Phosphorus: 5.8 mg/dL — ABNORMAL HIGH (ref 2.5–4.6)
Phosphorus: 6.1 mg/dL — ABNORMAL HIGH (ref 2.5–4.6)
Phosphorus: 5.9 mg/dL — ABNORMAL HIGH (ref 2.5–4.6)
Phosphorus: 5.8 mg/dL — ABNORMAL HIGH (ref 2.5–4.6)
Phosphorus: 6.2 mg/dL — ABNORMAL HIGH (ref 2.5–4.6)
Potassium: 4.4 mmol/L (ref 3.5–5.1)
Potassium: 4.3 mmol/L (ref 3.5–5.1)
SODIUM: 135 mmol/L (ref 135–145)
SODIUM: 138 mmol/L (ref 135–145)
SODIUM: 134 mmol/L — AB (ref 135–145)
SODIUM: 135 mmol/L (ref 135–145)
Sodium: 135 mmol/L (ref 135–145)
Sodium: 137 mmol/L (ref 135–145)

## 2017-06-05 LAB — GLUCOSE, CAPILLARY
GLUCOSE-CAPILLARY: 98 mg/dL (ref 65–99)
GLUCOSE-CAPILLARY: 83 mg/dL (ref 65–99)
GLUCOSE-CAPILLARY: 79 mg/dL (ref 65–99)
GLUCOSE-CAPILLARY: 81 mg/dL (ref 65–99)
GLUCOSE-CAPILLARY: 101 mg/dL — AB (ref 65–99)
Glucose-Capillary: 137 mg/dL — ABNORMAL HIGH (ref 65–99)
Glucose-Capillary: 49 mg/dL — ABNORMAL LOW (ref 65–99)
Glucose-Capillary: 73 mg/dL (ref 65–99)

## 2017-06-05 LAB — BPAM RBC
BLOOD PRODUCT EXPIRATION DATE: 201905122359
BLOOD PRODUCT EXPIRATION DATE: 201905162359
Blood Product Expiration Date: 201905162359
Blood Product Expiration Date: 201905162359
Blood Product Expiration Date: 201905162359
ISSUE DATE / TIME: 201904140216
ISSUE DATE / TIME: 201904150112
ISSUE DATE / TIME: 201904150634
ISSUE DATE / TIME: 201904151040
ISSUE DATE / TIME: 201904151155
UNIT TYPE AND RH: 6200
Unit Type and Rh: 6200
Unit Type and Rh: 6200
Unit Type and Rh: 6200
Unit Type and Rh: 6200

## 2017-06-05 LAB — PATHOLOGIST SMEAR REVIEW

## 2017-06-05 LAB — AMMONIA
AMMONIA: 65 umol/L — AB (ref 9–35)
Ammonia: 70 umol/L — ABNORMAL HIGH (ref 9–35)

## 2017-06-05 MED ORDER — DEXTROSE 50 % IV SOLN
25.0000 mL | Freq: Once | INTRAVENOUS | Status: AC
  Administered 2017-06-05: 25 mL via INTRAVENOUS

## 2017-06-05 MED ORDER — CHLORHEXIDINE GLUCONATE 0.12% ORAL RINSE (MEDLINE KIT)
15.0000 mL | Freq: Two times a day (BID) | OROMUCOSAL | Status: DC
  Administered 2017-06-05 – 2017-06-06 (×4): 15 mL via OROMUCOSAL

## 2017-06-05 MED ORDER — DEXTROSE 50 % IV SOLN
INTRAVENOUS | Status: AC
  Administered 2017-06-05: 25 mL via INTRAVENOUS
  Filled 2017-06-05: qty 50

## 2017-06-05 MED ORDER — ORAL CARE MOUTH RINSE
15.0000 mL | OROMUCOSAL | Status: DC
  Administered 2017-06-05 – 2017-06-07 (×18): 15 mL via OROMUCOSAL

## 2017-06-05 MED ORDER — SODIUM CHLORIDE 0.9 % IV BOLUS
2000.0000 mL | Freq: Once | INTRAVENOUS | Status: AC
  Administered 2017-06-05: 2000 mL via INTRAVENOUS

## 2017-06-05 MED ORDER — HYDROCORTISONE NA SUCCINATE PF 100 MG IJ SOLR
50.0000 mg | Freq: Four times a day (QID) | INTRAMUSCULAR | Status: DC
  Administered 2017-06-05 – 2017-06-06 (×7): 50 mg via INTRAVENOUS
  Filled 2017-06-05 (×7): qty 2

## 2017-06-05 MED ORDER — LACTULOSE ENEMA
300.0000 mL | Freq: Two times a day (BID) | ORAL | Status: DC
  Filled 2017-06-05 (×2): qty 300

## 2017-06-05 MED ORDER — SODIUM BICARBONATE 8.4 % IV SOLN
INTRAVENOUS | Status: DC
  Administered 2017-06-05 – 2017-06-06 (×3): via INTRAVENOUS
  Filled 2017-06-05 (×7): qty 150

## 2017-06-05 NOTE — Progress Notes (Signed)
Zachary Antigua, MD [address], [place], [place], [place], [address] [address], [place], [place], [place], [address] Phone: [phone]  Fax: [phone]   Subjective: Patient remains intubated and sedated.  No further hematemesis.  As per nursing staff, patient had one dark bowel movement at 8 AM this morning, none since then.   Objective: Exam: Vital signs in last 24 hours: Vitals:   06/05/17 1800 06/05/17 1900 06/05/17 1959 06/05/17 2000  BP: (!) 110/55 (!) 113/56  (!) 94/56  Pulse: (!) 123 (!) 119  (!) 118  Resp: 15 18  14   Temp: 97.7 F (36.5 C) (!) 97.5 F (36.4 C)  (!) 97.5 F (36.4 C)  TempSrc:      SpO2: 95% 97% 98% 100%  Weight:      Height:       Weight change:   Intake/Output Summary (Last 24 hours) at 06/05/2017 2123 Last data filed at 06/05/2017 2049 Gross per 24 hour  Intake 10520.21 ml  Output 110 ml  Net 10410.21 ml    General: Intubated, sedated Abd: Soft, NT/ND, No HSM Skin: Warm, no rashes Neck: Supple, Trachea midline   Lab Results: Lab Results  Component Value Date   WBC 33.7 (H) 06/05/2017   HGB 9.1 (L) 06/05/2017   HCT 27.5 (L) 06/05/2017   MCV 87.6 06/05/2017   PLT 93 (L) 06/05/2017   Micro Results: Recent Results (from the past 240 hour(s))  MRSA PCR Screening     Status: None   Collection Time: 05/31/2017  3:01 AM  Result Value Ref Range Status   MRSA by PCR NEGATIVE NEGATIVE Final    Comment:        The GeneXpert MRSA Assay (FDA approved for NASAL specimens only), is one component of a comprehensive MRSA colonization surveillance program. It is not intended to diagnose MRSA infection nor to guide or monitor treatment for MRSA infections. Performed at [facility], [address] [place], [place] [address]    Studies/Results: Dg Chest Port 1 View  Result Date: 05/28/2017 CLINICAL DATA:  Central line placement EXAM: PORTABLE CHEST 1 VIEW COMPARISON:  05/30/2017, 01/20/2017 FINDINGS:  Endotracheal tube tip is about 3.9 cm superior to the carina. Left-sided central venous catheter tip superimposes the SVC origin. Right-sided central venous catheter tip overlies the right atrium. No right pneumothorax is seen. Cardiomegaly. Tiny left pleural effusion. Increasing atelectasis or infiltrate at the left base. Aortic atherosclerosis. Air-filled dilated bowel in the upper abdomen. IMPRESSION: 1. New right-sided central venous catheter with tip superimposing the right atrium. Negative for a pneumothorax 2. Remaining support lines and tubes as above 3. Cardiomegaly with tiny left pleural effusion. Increasing atelectasis or infiltrate at the left base. Electronically Signed   By: Donavan Foil M.D.   On: 05/26/2017 21:31   Dg Chest Port 1 View  Result Date: 06/06/2017 CLINICAL DATA:  Left internal jugular catheter placement. EXAM: PORTABLE CHEST 1 VIEW COMPARISON:  Earlier today. FINDINGS: Interval left jugular catheter with its tip in the proximal superior vena cava. No pneumothorax. Endotracheal tube in satisfactory position. Poor inspiration. Stable enlarged cardiac silhouette. Minimal atelectasis at both lung bases. Thoracic spine degenerative changes. IMPRESSION: 1. Left jugular catheter tip in the proximal superior vena cava. 2. Stable cardiomegaly, poor inspiration and minimal bibasilar atelectasis. Electronically Signed   By: Claudie Revering M.D.   On: 05/23/2017 19:09   Dg Chest Port 1 View  Result Date: 06/01/2017 CLINICAL DATA:  Status post intubation. EXAM: PORTABLE CHEST  1 VIEW COMPARISON:  01/20/2017. FINDINGS: ETT 3.6 cm above carina. Cardiomegaly. Low lung volumes. No consolidation or edema. Calcified tortuous aorta. No osseous findings. IMPRESSION: ET tube in satisfactory position. Low lung volumes. Cardiomegaly without definite consolidation or edema. Electronically Signed   By: Staci Righter M.D.   On: 05/23/2017 15:04   Medications:  Scheduled Meds: . chlorhexidine gluconate  (MEDLINE KIT)  15 mL Mouth Rinse BID  . hydrocortisone sod succinate (SOLU-CORTEF) inj  50 mg Intravenous Q6H  . insulin aspart  0-15 Units Subcutaneous Q4H  . mouth rinse  15 mL Mouth Rinse 10 times per day  . pantoprazole (PROTONIX) IV  40 mg Intravenous Q12H  . rifaximin  550 mg Oral BID   Continuous Infusions: . cefTRIAXone (ROCEPHIN)  IV Stopped (06/05/17 2049)  . fentaNYL infusion INTRAVENOUS Stopped (06/05/17 0920)  . norepinephrine (LEVOPHED) Adult infusion 20.053 mcg/min (06/05/17 1735)  . octreotide  (SANDOSTATIN)    IV infusion 50 mcg/hr (06/05/17 1912)  . phenylephrine (NEO-SYNEPHRINE) Adult infusion 174.933 mcg/min (06/05/17 1735)  . pureflow 3 each (06/05/17 0349)  .  sodium bicarbonate  infusion 1000 mL 100 mL/hr at 06/05/17 1735  . 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  As evidenced by stable hemoglobin, no further hematemesis since procedure.  However, prognosis remains guarded, with renal failure requiring CRRT at this time.  Zachary Buck, and [facility] have not accepted patient for transfer, as they do not believe he would be a good candidate for TIPS.  Continue IV ceftriaxone, octreotide drip, and Protonix IV twice daily  Continue serial CBCs and transfuse PRN Further care for renal failure, acidosis as per primary team.   LOS: 2 days   Zachary Antigua, MD 06/05/2017, 9:23 PM

## 2017-06-05 NOTE — Progress Notes (Signed)
Initial Nutrition Assessment  DOCUMENTATION CODES:   Not applicable  INTERVENTION:  No enteral access at this time and patient is not hemodynamically stable.  Once okay to place enteral access per GI, recommend initiating Vital AF 1.2 at trickle rate of 20 mL/hr.  Once patient is hemodynamically stable can advance to Vital AF 1.2 at 50 mL/hr + Pro-Stat 30 mL BID. Provides 1640 kcal, 120 grams of protein, 972 mL H2O daily.  Recommend liquid MVI daily with initiation of tube feeds.  NUTRITION DIAGNOSIS:   Inadequate oral intake related to inability to eat as evidenced by NPO status.  GOAL:   Provide needs based on ASPEN/SCCM guidelines  MONITOR:   Vent status, Labs, Weight trends, TF tolerance, I & O's  REASON FOR ASSESSMENT:   Ventilator    ASSESSMENT:   77 year old male with PMHx of hypercholesterolemia, hx clavicle fracture, hx leg fracture, CAD, hx MI, cirrhosis, esophageal varices, hepatic encephalopathy on lactulose, HTN, DM type 2, multiple myeloma who presented with hematemesis s/p EGD on 4/15 which found oozing esophageal varices s/p placement of ten bands, remains on ventilator after EGD with severe shock, renal failure requiring CVVHD.   Patient intubated and sedated. On CVVHD with no UF. On Retail banker. Weight in chart appears to be stable between 162-168 lbs. Current weight likely falsely elevated with edema.  No enteral access at this time.  MAP: 55-91 mmHg; on 3 pressors  Patient is currently intubated on ventilator support MV: 7.6 L/min Temp (24hrs), Avg:96.6 F (35.9 C), Min:94.6 F (34.8 C), Max:98.2 F (36.8 C)  Propofol: N/A  Medications reviewed and include: Novolog 0-15 units Q4hrs, pantoprazole, ceftriaxone, fentanyl gtt, norepinephrine gtt at 20 mcg/min, octreotide gtt, phenylephrine gtt at 200 mcg/min, vasopressin gtt at 0.03 units/min, sodium bicarbonate 150 mEq in sterile water at 100 mL/hr.  Labs reviewed: CBG 73, CO2 17, BUN 31,  Creatinine 2.05, Phosphorus 6.1.  I/O: 160 mL UOP yesterday (0.1 mL/kg/hr)  Weight trend: 82.8 kg on 4/16; +8.2 kg from weight of 74.6 kg on 4/14  Discussed with RN and on rounds.  NUTRITION - FOCUSED PHYSICAL EXAM:    Most Recent Value  Orbital Region  No depletion  Upper Arm Region  Unable to assess [edema]  Thoracic and Lumbar Region  No depletion  Buccal Region  Unable to assess  Temple Region  No depletion  Clavicle Bone Region  No depletion [left clavicle more prominent, but may be related to hx of clavicle fracture]  Clavicle and Acromion Bone Region  No depletion  Scapular Bone Region  No depletion  Dorsal Hand  Unable to assess [edema]  Patellar Region  No depletion  Anterior Thigh Region  No depletion  Posterior Calf Region  No depletion  Edema (RD Assessment)  Moderate  Hair  Reviewed  Eyes  Unable to assess  Mouth  Unable to assess  Skin  Reviewed  Nails  Reviewed     Diet Order:  Diet NPO time specified  EDUCATION NEEDS:   Not appropriate for education at this time  Skin:  Skin Assessment: Reviewed RN Assessment  Last BM:  06/05/2017 - large type 6  Height:   Ht Readings from Last 1 Encounters:  06/19/2017 5' 9"  (1.753 m)    Weight:   Wt Readings from Last 1 Encounters:  06/05/17 182 lb 8.7 oz (82.8 kg)    Ideal Body Weight:     BMI:  Body mass index is 26.96 kg/m.  Estimated Nutritional Needs:  Kcal:  1578 (PSU 2003b w/ MSJ 1467, Ve 7.6, Tmax 36.8)  Protein:  112-134 grams (1.5-1.8 grams/kg on CVVHD)  Fluid:  1.8 L/day (25 mL/kg)  Willey Blade, MS, RD, LDN Office: [phone] Pager: [phone] After Hours/Weekend Pager: [phone]

## 2017-06-05 NOTE — Telephone Encounter (Signed)
Pt wife left vm to speak to Ginger or Dr. Allen Norris, pt is in ICU at [facility]  she wanted some information on pt symptoms and information, input from Dr. Allen Norris

## 2017-06-05 NOTE — Progress Notes (Signed)
PULMONARY / CRITICAL CARE MEDICINE   Name: Zachary Buck MRN: [id number] DOB: [birth year]    ADMISSION DATE:  06/10/2017   CONSULTATION DATE:  06/04/2014  REFERRING MD:  Dr. 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  When EMS arrived, patient was hypotensive.  Upon arrival in the ED, he was hypotensive and his workup showed a hemoglobin of 8.0.  Patient states that he vomited about 500 cc with blood clots.  He was admitted to 2A but upon arrival and on the floor, he said he vomited about 700 cc of bright red blood, became hypotensive and had a near syncopal episode.  Rapid response was called and he was transferred to the ICU for further management.  His repeat CBC showed a hemoglobin of 6.2.  He is currently receiving a unit of packed red blood cells.  He is complaining of abdominal cramping and mild nausea.  Denies diarrhea and bloody stools  PAST MEDICAL HISTORY :  He  has a past medical history of Anemia, Atherosclerotic heart disease, CAD (coronary artery disease), Cirrhosis of liver ([place]), Clavicle fracture, Diabetes mellitus without complication ([place]), Elevated blood sugar, Esophageal varices (HCC), Hepatic encephalopathy ([place]), Hypercholesterolemia, Hypertension, Internal hemorrhoids, Leg fracture, Multiple myeloma ([place]), Myocardial infarct [facility]), Prostate enlargement, and Tubular adenoma of colon.  SUBJECTIVE:  S/P EGD BANDING-10 BANDS SEVERE ESOPH VARICES SEVERE SHOCK RENAL FAILURE STARTED CRRT GIVEN PRBCS REMAINS CRITICALLY ILL ON VASOPRESSORS UNABLE TO WEAN FROM VENT TODAY   VITAL SIGNS: BP (!) 114/57   Pulse (!) 115   Temp (!) 96.2 F (35.7 C) (Oral)   Resp 15   Ht 5' 9"  (1.753  m)   Wt 182 lb 8.7 oz (82.8 kg)   SpO2 99%   BMI 26.96 kg/m   HEMODYNAMICS: CVP:  [6 mmHg] 6 mmHg  VENTILATOR SETTINGS: Vent Mode: PRVC FiO2 (%):  [30 %-60 %] 30 % Set Rate:  [15 bmp] 15 bmp Vt Set:  [500 mL] 500 mL PEEP:  [5 cmH20] 5 cmH20 Plateau Pressure:  [16 cmH20] 16 cmH20  INTAKE / OUTPUT: I/O last 3 completed shifts: In: 9239.7 [I.V.:7839.7; Blood:1400] Out: 1560 [Urine:160; Stool:600; Blood:800]  PHYSICAL EXAMINATION: General: ill appearing Neuro: Alert and oriented x4, cranial nerves intact HEENT: PERRLA, no JVD Cardiovascular: Apical pulse regular, S1-S2, no murmur regurg or gallop, +2 pulses, no edema Lungs: Bilateral breath sounds without wheezes or rhonchi Abdomen: Nondistended, hypoactive bowel sounds in all 4 quadrants, palpation reveals epigastric tenderness and right upper quadrant tenderness, no referred rebound tenderness Musculoskeletal: Positive range of motion in upper and lower extremities, no joint deformities Skin: Pale, warm and dry  LABS:  BMET Recent Labs  Lab 06/11/2017 1900 06/11/2017 2240 06/05/17 0326  NA 140 136 135  K 6.4* 5.8* 4.7  CL 120* 116* 112*  CO2 16* 15* 18*  BUN 52* 56* 43*  CREATININE 2.87* 3.13* 2.62*  GLUCOSE 245* 198* 115*    Electrolytes Recent Labs  Lab 05/31/2017 0734 06/13/2017 1227  05/26/2017 0337  05/25/2017 1900 05/25/2017 2240 06/05/17 0326  CALCIUM 8.4* 8.4*   < > 8.3*   < > 7.4* 7.4* 7.7*  MG 1.2* 1.7  --   --   --   --   --   --  PHOS 3.9  --   --  3.8  --   --  7.2* 5.6*   < > = values in this interval not displayed.    CBC Recent Labs  Lab 06/16/2017 2244 05/21/2017 0226 06/11/2017 0734  06/18/2017 0337 06/08/2017 0707 05/28/2017 1346  WBC 11.1* 14.9* 10.5  --   --   --   --   HGB 8.0* 6.2* 7.8*   < > 7.2* 7.1* 8.3*  HCT 23.4* 18.6* 22.6*  --   --   --   --   PLT 146* 163 115*  --   --   --   --    < > = values in this interval not displayed.    Coag's Recent Labs  Lab 06/14/2017 2244 06/09/2017 0734   APTT 29  --   INR 1.30 1.37    Sepsis Markers No results for input(s): LATICACIDVEN, PROCALCITON, O2SATVEN in the last 168 hours.  ABG Recent Labs  Lab 06/15/2017 1422 06/05/17 0743  PHART 7.15* 7.22*  PCO2ART 37 45  PO2ART 127* 110*    Liver Enzymes Recent Labs  Lab 06/01/2017 2244 06/19/2017 0734  06/17/2017 1714 06/05/2017 2240 06/05/17 0326  AST 25 25  --  41  --   --   ALT 18 16*  --  29  --   --   ALKPHOS 59 51  --  47  --   --   BILITOT 0.7 0.8  --  0.8  --   --   ALBUMIN 2.3* 2.2*   < > 1.9* 1.8* 1.9*   < > = values in this interval not displayed.    Cardiac Enzymes No results for input(s): TROPONINI, PROBNP in the last 168 hours.  Glucose Recent Labs  Lab 06/10/2017 1600 05/29/2017 1620 06/06/2017 2025 06/05/17 0034 06/05/17 0359 06/05/17 0741  GLUCAP 294* 275* 198* 137* 98 83    Imaging Dg Chest Port 1 View  Result Date: 06/08/2017 CLINICAL DATA:  Central line placement EXAM: PORTABLE CHEST 1 VIEW COMPARISON:  05/23/2017, 01/20/2017 FINDINGS: Endotracheal tube tip is about 3.9 cm superior to the carina. Left-sided central venous catheter tip superimposes the SVC origin. Right-sided central venous catheter tip overlies the right atrium. No right pneumothorax is seen. Cardiomegaly. Tiny left pleural effusion. Increasing atelectasis or infiltrate at the left base. Aortic atherosclerosis. Air-filled dilated bowel in the upper abdomen. IMPRESSION: 1. New right-sided central venous catheter with tip superimposing the right atrium. Negative for a pneumothorax 2. Remaining support lines and tubes as above 3. Cardiomegaly with tiny left pleural effusion. Increasing atelectasis or infiltrate at the left base. Electronically Signed   By: Donavan Foil M.D.   On: 06/16/2017 21:31   Dg Chest Port 1 View  Result Date: 06/15/2017 CLINICAL DATA:  Left internal jugular catheter placement. EXAM: PORTABLE CHEST 1 VIEW COMPARISON:  Earlier today. FINDINGS: Interval left jugular  catheter with its tip in the proximal superior vena cava. No pneumothorax. Endotracheal tube in satisfactory position. Poor inspiration. Stable enlarged cardiac silhouette. Minimal atelectasis at both lung bases. Thoracic spine degenerative changes. IMPRESSION: 1. Left jugular catheter tip in the proximal superior vena cava. 2. Stable cardiomegaly, poor inspiration and minimal bibasilar atelectasis. Electronically Signed   By: Claudie Revering M.D.   On: 06/08/2017 19:09   Dg Chest Port 1 View  Result Date: 06/01/2017 CLINICAL DATA:  Status post intubation. EXAM: PORTABLE CHEST 1 VIEW COMPARISON:  01/20/2017. FINDINGS: ETT 3.6 cm above carina. Cardiomegaly. Low lung volumes.  No consolidation or edema. Calcified tortuous aorta. No osseous findings. IMPRESSION: ET tube in satisfactory position. Low lung volumes. Cardiomegaly without definite consolidation or edema. Electronically Signed   By: Staci Righter M.D.   On: 05/28/2017 15:04     STUDIES:  Due for EGD today  CULTURES: None  ANTIBIOTICS: Ceftriaxone  SIGNIFICANT EVENTS: 4/13: Admitted with GI bleed  LINES/TUBES: Peripheral IV Left IJ 4/15 RT FEMORAL VASC CATH 4/15  DISCUSSION: 77 year old with a history of esophageal varices, liver cirrhosis and hepatic encephalopathy presenting with a recurrent GI bleed secondary to esophageal varices, near syncope and severe hypovolemic shock. GI notified of drop in hemoglobin and dark stools, severe anemia  COMPLICATED BY SEVERE ACIDOSIS AND PROGRESSIVE RENAL FAILURE AND SEVERE RESP FAILURE  ASSESSMENT   1.Respiratory Failure -continue Full MV support -continue Bronchodilator Therapy -Wean Fio2 and PEEP as tolerated -will perform SAT/SBt when respiratory parameters are met  2.Acute upper GI bleed-ESOPH VARICEAL S/P BANDING PPI INFUSION OCTREOTIDE AND ABX [facility] SAYS NOT A CANDIDATE FOR TIPS FAMILY DECIDED NOT TO PURSUE TIPS AND DID NOT WANT TO TRANSFER  3.MASSIVE GIB AND BLOOD LOSS  COMPLICATED BY ACUTE REANL FAILURE AND SEVERE ACIDOSIS  4.Acute blood loss anemia-FOLLOW CBC AND TRANSFUSE AS NEEDED  5.Cryptogenic liver cirrhosis -POOR PROGNOSIS    Critical Care Time devoted to patient care services described in this note is 35 minutes.  Overall, patient is critically ill, prognosis is guarded.  high risk for cardiac arrest and death.   AT THIS TIME, WE ARE WAITING TO SEE IF PATIENTS BLEEDING STABILIZES WHILE SUPPORTING BLOOD PRESSURE AND TRANSFUSING AS NEEDED AND PROVIDING CRRT FOR SEVERE ACIDOSIS AND HYPERKALEMIA, PATIENT NOT TO WEAN FROM VENT TODAY  Jerrion Tabbert Patricia Pesa, M.D.  [facility]  Medical Director [place] Director [facility] Cardio-Pulmonary Department

## 2017-06-05 NOTE — Progress Notes (Signed)
Medication adjustment for CRRT  All meds reviewed. Patient is not currently receiving and medications that require dosing for CRRT. Pharmacy will follow and adjust regimen as indicated.  Sim Boast, PharmD, BCPS  06/05/17 12:29 AM

## 2017-06-05 NOTE — Progress Notes (Signed)
   06/05/17 1120  Clinical Encounter Type  Visited With Patient  Visit Type Initial  Referral From Chaplain  Consult/Referral To [place]   In chaplains' morning report, it was suggested that unit chaplain check in on patient/family.  In this visit, no family was present.  Chaplain introduced herself aloud and prayed silently for patient.

## 2017-06-05 NOTE — Progress Notes (Signed)
AM labs, four hours after CRRT start drawn. K within normal range and BUN/Creat improved. NP discussed with family who has stayed at bedtime all shift. Blood pressure still low and on pressors.  Continue to monitor.

## 2017-06-05 NOTE — Progress Notes (Signed)
[place] [facility] [place] NAME: Cullan Launer    MR#:  [id number]  DATE OF BIRTH:  08-14-1940  SUBJECTIVE:  CHIEF COMPLAINT:   Chief Complaint  Patient presents with  . GI Bleeding  Remains critically ill, status post temporary hemodialysis catheter placement, receiving CRRT, patient is a non-candidate for TIPS procedure  REVIEW OF SYSTEMS:  CONSTITUTIONAL: No fever, fatigue or weakness.  EYES: No blurred or double vision.  EARS, NOSE, AND THROAT: No tinnitus or ear pain.  RESPIRATORY: No cough, shortness of breath, wheezing or hemoptysis.  CARDIOVASCULAR: No chest pain, orthopnea, edema.  GASTROINTESTINAL: No nausea, vomiting, diarrhea or abdominal pain.  GENITOURINARY: No dysuria, hematuria.  ENDOCRINE: No polyuria, nocturia,  HEMATOLOGY: No anemia, easy bruising or bleeding SKIN: No rash or lesion. MUSCULOSKELETAL: No joint pain or arthritis.   NEUROLOGIC: No tingling, numbness, weakness.  PSYCHIATRY: No anxiety or depression.   ROS  DRUG ALLERGIES:   Allergies  Allergen Reactions  . Niaspan [Niacin Er] Other (See Comments)    Just does not want to take     VITALS:  Blood pressure (!) 81/53, pulse (!) 124, temperature (!) 97.3 F (36.3 C), resp. rate 15, height 5\' 9"  (1.753 m), weight 82.8 kg (182 lb 8.7 oz), SpO2 94 %.  PHYSICAL EXAMINATION:  GENERAL:  77 y.o.-year-old patient lying in the bed with no acute distress.  EYES: Pupils equal, round, reactive to light and accommodation. No scleral icterus. Extraocular muscles intact.  HEENT: Head atraumatic, normocephalic. Oropharynx and nasopharynx clear.  NECK:  Supple, no jugular venous distention. No thyroid enlargement, no tenderness.  LUNGS: Normal breath sounds bilaterally, no wheezing, rales,rhonchi or crepitation. No use of accessory muscles of respiration.  CARDIOVASCULAR: S1, S2 normal. No murmurs, rubs, or gallops.  ABDOMEN: Soft, nontender, nondistended. Bowel  sounds present. No organomegaly or mass.  EXTREMITIES: No pedal edema, cyanosis, or clubbing.  NEUROLOGIC: Cranial nerves II through XII are intact. Muscle strength 5/5 in all extremities. Sensation intact. Gait not checked.  PSYCHIATRIC: The patient is alert and oriented x 3.  SKIN: No obvious rash, lesion, or ulcer.   Physical Exam LABORATORY PANEL:   CBC Recent Labs  Lab 06/05/17 0937  WBC 33.7*  HGB 9.1*  HCT 27.5*  PLT 93*   ------------------------------------------------------------------------------------------------------------------  Chemistries  Recent Labs  Lab 06/17/2017 1227  06/14/2017 1714  06/05/17 1408  NA 138   < >  --    < > 134*  K 5.9*   < > 6.7*   < > 4.3  CL 115*   < >  --    < > 108  CO2 17*   < >  --    < > 18*  GLUCOSE 273*   < >  --    < > 65  BUN 46*   < >  --    < > 30*  CREATININE 1.79*   < >  --    < > 2.11*  CALCIUM 8.4*   < >  --    < > 6.7*  MG 1.7  --   --   --   --   AST  --   --  41  --   --   ALT  --   --  29  --   --   ALKPHOS  --   --  47  --   --   BILITOT  --   --  0.8  --   --    < > =  values in this interval not displayed.   ------------------------------------------------------------------------------------------------------------------  Cardiac Enzymes No results for input(s): TROPONINI in the last 168 hours. ------------------------------------------------------------------------------------------------------------------  RADIOLOGY:  Dg Chest Port 1 View  Result Date: 06/12/2017 CLINICAL DATA:  Central line placement EXAM: PORTABLE CHEST 1 VIEW COMPARISON:  06/16/2017, 01/20/2017 FINDINGS: Endotracheal tube tip is about 3.9 cm superior to the carina. Left-sided central venous catheter tip superimposes the SVC origin. Right-sided central venous catheter tip overlies the right atrium. No right pneumothorax is seen. Cardiomegaly. Tiny left pleural effusion. Increasing atelectasis or infiltrate at the left base. Aortic  atherosclerosis. Air-filled dilated bowel in the upper abdomen. IMPRESSION: 1. New right-sided central venous catheter with tip superimposing the right atrium. Negative for a pneumothorax 2. Remaining support lines and tubes as above 3. Cardiomegaly with tiny left pleural effusion. Increasing atelectasis or infiltrate at the left base. Electronically Signed   By: Donavan Foil M.D.   On: 06/11/2017 21:31   Dg Chest Port 1 View  Result Date: 06/12/2017 CLINICAL DATA:  Left internal jugular catheter placement. EXAM: PORTABLE CHEST 1 VIEW COMPARISON:  Earlier today. FINDINGS: Interval left jugular catheter with its tip in the proximal superior vena cava. No pneumothorax. Endotracheal tube in satisfactory position. Poor inspiration. Stable enlarged cardiac silhouette. Minimal atelectasis at both lung bases. Thoracic spine degenerative changes. IMPRESSION: 1. Left jugular catheter tip in the proximal superior vena cava. 2. Stable cardiomegaly, poor inspiration and minimal bibasilar atelectasis. Electronically Signed   By: Claudie Revering M.D.   On: 05/26/2017 19:09   Dg Chest Port 1 View  Result Date: 05/22/2017 CLINICAL DATA:  Status post intubation. EXAM: PORTABLE CHEST 1 VIEW COMPARISON:  01/20/2017. FINDINGS: ETT 3.6 cm above carina. Cardiomegaly. Low lung volumes. No consolidation or edema. Calcified tortuous aorta. No osseous findings. IMPRESSION: ET tube in satisfactory position. Low lung volumes. Cardiomegaly without definite consolidation or edema. Electronically Signed   By: Staci Righter M.D.   On: 06/11/2017 15:04    ASSESSMENT AND PLAN:  A/P: 12M UGIB, 2/2 esophageal varices.  1.) UGIB/symptomatic acute blood loss anemia Secondary to esophageal varices Presented with w/ syncope, hypotension, hemodynamic instability Status post EGD by gastroenterology on June 04, 2017 - noted esophageal variceal bleeding/required transfusion during procedure/status post banding, per  gastroenterology-recommended transfer to [facility] tertiary facility for TIPS, but patient is not a candidate for TIPS procedure, continue ICU care, Octreotide/Protonix drips, ceftriaxone for prevention of SBP  2.)  Acute renal failure with hyperkalemia with chronic kidney disease Nephrology following Receiving CRRT, continue to hold ACE inhibitor  3.) Hyperglycemia with history of IDDM Stable on current regiment  4.) NAFLD/Cirrhosis Stable Continue Lactulose, Rifaximin, and Aldactone  5.) HTN Stable  6.) HLD/CAD/MI Stable     All the records are reviewed and case discussed with Care Management/Social Workerr. Management plans discussed with the patient, family and they are in agreement.  CODE STATUS: DNR  TOTAL TIME TAKING CARE OF THIS PATIENT: 35 minutes.     POSSIBLE D/C IN 3-5 DAYS, DEPENDING ON CLINICAL CONDITION.   Avel Peace Kimyetta Flott M.D on 06/05/2017   Between 7am to 6pm - Pager - [phone]  After 6pm go to [email or web address] - password [email or web address] [facility]  Office  [phone]  CC: Primary care physician; Einar Pheasant, MD  Note: This dictation was prepared with Dragon dictation along with smaller phrase technology. Any transcriptional errors that result from this process are unintentional.

## 2017-06-05 NOTE — Progress Notes (Addendum)
PT remained on CRRT all day. SBT held due to ABG results this morning. Fentanyl has been turned off since 0800 this morning. Patient has gradually become somewhat responsive and will open eyes to voice (however, this is inconsistent). PT received a total of 4x1000 NS boluses. CVP has increased from 6 to 17. Pt still on vasopressin, levo, and neo, although I have been able to titrate down some. Ammonia levels elevated, lactulose enema has been held per MD. Pt had a small, dark bowel movement this morning at around 0800 and none since. A bair hugger was placed on pt this morning for a rectal temp of 94. PT temp has come up to 97.5. Retail banker on standby.

## 2017-06-05 NOTE — Consult Note (Signed)
[facility] CONSULT NOTE    Date: 06/05/2017                  Patient Name:  Zachary Buck  MRN: [id number]  DOB: 1940-05-22  Age / Sex: 77 y.o., male         PCP: Einar Pheasant, MD                 Service Requesting Consult: Critical care                 Reason for Consult: Acute renal failure            History of Present Illness: Patient is a 77 y.o. 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  Initial workup has revealed multiple bleeding varices.  He is currently intubated and sedated and maintained on the ventilator.  Yesterday evening we were contacted by critical care.  He was found to have significant hyperkalemia with oliguria.  Therefore CRRT was initiated after temporary dialysis catheter was placed.  His metabolic parameters have improved this a.m. as serum potassium is normal at 4.7.  He appears to be tolerating CRRT well at the moment.  The patient's wife is at the bedside at the moment.   Medications: Outpatient medications: Medications Prior to Admission  Medication Sig Dispense Refill Last Dose  . Alpha-Lipoic Acid (LIPOIC ACID PO) Take 200 mg by mouth daily.    05/30/2017 at Unknown time  . Ascorbic Acid (VITAMIN C) 1000 MG tablet Take 1,000 mg by mouth daily.   06/16/2017 at Unknown time  . B Complex Vitamins (VITAMIN B COMPLEX) TABS Take by mouth daily.   05/28/2017 at Unknown time  . blood glucose meter kit and supplies KIT Dispense based on patient and insurance preference. Use up to four times daily as directed. E11.9 Please dispense Freestyle freedom meter and supplies. 1 each 0 unknown at unknown  .  Coenzyme Q10 100 MG capsule Take 100 mg by mouth daily.    06/14/2017 at Unknown time  . furosemide (LASIX) 20 MG tablet Take 1 tablet (20 mg total) by mouth 2 (two) times daily. (Patient taking differently: Take 20 mg by mouth as needed. ) 60 tablet 5 prn at prn  . glipiZIDE (GLUCOTROL) 5 MG tablet Take 1 tablet (5 mg total) by mouth See admin instructions. 270 tablet 1 06/19/2017 at 0800  . glucose blood (FREESTYLE LITE) test strip USE TO CHECK BLOOD SUGAR THREE TIMES A DAY 100 each 3 unknown at unknown  . glucose blood (ONE TOUCH ULTRA TEST) test strip Use as instructed 100 each 12 unknown at unknown  . insulin glargine (LANTUS) 100 unit/mL SOPN Inject 0.1 mLs (10 Units total) into the skin at bedtime. (Patient taking differently: Inject 16 Units into the skin at bedtime. ) 15 mL 11 Past Week at Unknown time  . Insulin Pen Needle (PEN NEEDLES) 31G X 6 MM MISC 1 application by Does not apply route 4 (four) times daily. 200 each 4 unknown at unknown  . lactulose (CHRONULAC) 10 GM/15ML solution Take 30 mLs (20 g total) by mouth 4 (four) times daily. 946 mL 6 05/31/2017 at Unknown time  . Lancets (ONETOUCH ULTRASOFT) lancets Use  as instructed 100 each 12 unknown at unknown  . lisinopril (PRINIVIL,ZESTRIL) 20 MG tablet TAKE ONE TABLET BY MOUTH EVERY DAY/ AM   06/01/2017 at 0800  . MILK THISTLE PO Take 25 mg by mouth daily.   06/12/2017 at Unknown time  . NON FORMULARY Take 1 tablet by mouth daily. burburan   06/01/2017 at Unknown time  . Omega-3 Fatty Acids (FISH OIL) 1000 MG CAPS Take 4,000 mg by mouth daily.    05/24/2017 at Unknown time  . rifaximin (XIFAXAN) 550 MG TABS tablet Take 1 tablet (550 mg total) by mouth 2 (two) times daily. 60 tablet 11 06/10/2017 at Unknown time  . Syringe/Needle, Disp, 30G X 1/2" 1 ML MISC 1 Syringe by Does not apply route as directed. 150 each 6 unknown at unknown  . Theanine 100 MG CAPS Take by mouth daily.   05/25/2017 at Unknown time  . triamcinolone cream (KENALOG) 0.1 %  Apply topically 2 (two) times daily. Please avoid face and genitalia area.  Do not use in the same spot for more than 7-10 days in a row 80 g 0 06/08/2017 at Unknown time  . Turmeric POWD 4,000 mg by Does not apply route daily.   06/12/2017 at Unknown time  . Cinnamon 500 MG capsule Take 500 mg by mouth.   Not Taking at Unknown time  . sildenafil (REVATIO) 20 MG tablet 3 to 5 tablets per day or as instructed   Not Taking at Unknown time  . spironolactone (ALDACTONE) 50 MG tablet Take 1 tablet (50 mg total) by mouth daily. (Patient not taking: Reported on 06/01/2017) 30 tablet 6 Not Taking at Unknown time  . traMADol (ULTRAM) 50 MG tablet Take 1 tablet (50 mg total) by mouth every 6 (six) hours as needed. (Patient not taking: Reported on 05/26/2017) 15 tablet 0 Not Taking at Unknown time  . zinc sulfate 220 (50 Zn) MG capsule Take 1 capsule (220 mg total) by mouth 2 (two) times daily. (Patient not taking: Reported on 06/13/2017)   Not Taking at Unknown time    Current medications: Current Facility-Administered Medications  Medication Dose Route Frequency Provider Last Rate Last Dose  . 0.9 %  sodium chloride infusion   Intravenous Once Tukov, Magadalene S, NP      . acetaminophen (TYLENOL) tablet 650 mg  650 mg Oral Q6H PRN Salary, Montell D, MD      . bisacodyl (DULCOLAX) EC tablet 5 mg  5 mg Oral Daily PRN Arta Silence, MD      . cefTRIAXone (ROCEPHIN) 2 g in sodium chloride 0.9 % 100 mL IVPB  2 g Intravenous Q24H Flora Lipps, MD      . chlorhexidine gluconate (MEDLINE KIT) (PERIDEX) 0.12 % solution 15 mL  15 mL Mouth Rinse BID Awilda Bill, NP   15 mL at 06/05/17 0807  . fentaNYL (SUBLIMAZE) bolus via infusion 25 mcg  25 mcg Intravenous Q1H PRN Salary, Montell D, MD      . fentaNYL 2541mg in NS 2522m(1068mml) infusion-PREMIX  25-400 mcg/hr Intravenous Continuous Salary, Montell D, MD 15 mL/hr at 06/05/17 0805 150 mcg/hr at 06/05/17 0805  . heparin injection 1,000-6,000 Units   1,000-6,000 Units CRRT PRN Julio Zappia, MD      . insulin aspart (novoLOG) injection 0-15 Units  0-15 Units Subcutaneous Q4H Salary, Montell D, MD   2 Units at 06/05/17 0050  . MEDLINE mouth rinse  15 mL Mouth Rinse 10 times per day BlaAwilda Bill  NP   15 mL at 06/05/17 0544  . midazolam (VERSED) injection 1-2 mg  1-2 mg Intravenous Q15 min PRN Flora Lipps, MD   2 mg at 06/01/2017 1234  . norepinephrine (LEVOPHED) 16 mg in dextrose 5 % 250 mL (0.064 mg/mL) infusion  0-40 mcg/min Intravenous Titrated Flora Lipps, MD 28.1 mL/hr at 06/05/17 0637 30 mcg/min at 06/05/17 0637  . octreotide (SANDOSTATIN) 500 mcg in sodium chloride 0.9 % 250 mL (2 mcg/mL) infusion  50 mcg/hr Intravenous Continuous Arta Silence, MD 25 mL/hr at 06/11/2017 1947 50 mcg/hr at 06/19/2017 1947  . ondansetron (ZOFRAN) tablet 4 mg  4 mg Oral Q6H PRN Arta Silence, MD       Or  . ondansetron (ZOFRAN) injection 4 mg  4 mg Intravenous Q6H PRN Arta Silence, MD   4 mg at 05/21/2017 2036  . pantoprazole (PROTONIX) injection 40 mg  40 mg Intravenous Q12H Arta Silence, MD   40 mg at 06/06/2017 2128  . phenylephrine (NEO-SYNEPHRINE) 40 mg in sodium chloride 0.9 % 250 mL (0.16 mg/mL) infusion  0-400 mcg/min Intravenous Titrated Flora Lipps, MD 93.8 mL/hr at 06/05/17 0705 250 mcg/min at 06/05/17 0705  . pureflow IV solution for Dialysis   CRRT Continuous Holley Raring, Ygnacio Fecteau, MD 2,000 mL/hr at 06/05/17 0349 3 each at 06/05/17 0349  . rifaximin (XIFAXAN) tablet 550 mg  550 mg Oral BID Arta Silence, MD   Stopped at 05/24/2017 [phone]  . senna-docusate (Senokot-S) tablet 1 tablet  1 tablet Oral QHS PRN Arta Silence, MD      . sodium bicarbonate 150 mEq in sterile water 1,000 mL infusion   Intravenous Continuous Flora Lipps, MD 100 mL/hr at 06/05/17 0135    . vasopressin (PITRESSIN) 40 Units in sodium chloride 0.9 % 250 mL (0.16 Units/mL) infusion  0.03 Units/min Intravenous Continuous Mortimer Fries, Kurian, MD 11.3 mL/hr at  05/23/2017 1947 0.03 Units/min at 06/01/2017 1947   Facility-Administered Medications Ordered in Other Encounters  Medication Dose Route Frequency Provider Last Rate Last Dose  . alteplase (CATHFLO ACTIVASE) injection 2 mg  2 mg Intracatheter Once PRN Nolon Stalls C, MD      . heparin lock flush 100 unit/mL  500 Units Intracatheter Once PRN Mike Gip, Melissa C, MD      . heparin lock flush 100 unit/mL  250 Units Intracatheter Once PRN Nolon Stalls C, MD      . sodium chloride 0.9 % injection 10 mL  10 mL Intracatheter PRN Corcoran, Melissa C, MD      . sodium chloride 0.9 % injection 3 mL  3 mL Intravenous Once PRN Lequita Asal, MD          Allergies: Allergies  Allergen Reactions  . Niaspan [Niacin Er] Other (See Comments)    Just does not want to take       Past Medical History: Past Medical History:  Diagnosis Date  . Anemia   . Atherosclerotic heart disease    s/p MI, s/p stent mid circumflex  . CAD (coronary artery disease)    Dr Nehemiah Massed, cardiologist  . Cirrhosis of liver ([place])   . Clavicle fracture   . Diabetes mellitus without complication ([place])    oral med  . Elevated blood sugar    02/03/16 and 02/09/16 had BS over 400.  otherwise running around 250  . Esophageal varices ([place])   . Hepatic encephalopathy ([place])   . Hypercholesterolemia   . Hypertension    controlled on meds  . Internal hemorrhoids   .  Leg fracture   . Multiple myeloma ([place])   . Myocardial infarct ([place])   . Prostate enlargement   . Tubular adenoma of colon      Past Surgical History: Past Surgical History:  Procedure Laterality Date  . COLONOSCOPY WITH PROPOFOL N/A 03/04/2015   Procedure: COLONOSCOPY WITH PROPOFOL;  Surgeon: Jerene Bears, MD;  Location: [facility];  Service: Gastroenterology;  Laterality: N/A;  . COLONOSCOPY WITH PROPOFOL N/A 12/09/2015   Procedure: COLONOSCOPY WITH PROPOFOL;  Surgeon: [name], MD;  Location: [place];  Service: Endoscopy;   Laterality: N/A;  DIABETIC-ORAL MEDS  . CORONARY ANGIOPLASTY WITH STENT PLACEMENT     2 stents  . ESOPHAGOGASTRODUODENOSCOPY (EGD) WITH PROPOFOL N/A 03/04/2015   Procedure: ESOPHAGOGASTRODUODENOSCOPY (EGD) WITH PROPOFOL;  Surgeon: Jerene Bears, MD;  Location: [facility];  Service: Gastroenterology;  Laterality: N/A;  . ESOPHAGOGASTRODUODENOSCOPY (EGD) WITH PROPOFOL N/A 12/09/2015   Procedure: ESOPHAGOGASTRODUODENOSCOPY (EGD) WITH PROPOFOL;  Surgeon: [name], MD;  Location: [place];  Service: Endoscopy;  Laterality: N/A;  . ESOPHAGOGASTRODUODENOSCOPY (EGD) WITH PROPOFOL N/A 12/21/2015   Procedure: ESOPHAGOGASTRODUODENOSCOPY (EGD) WITH PROPOFOL;  Surgeon: [name], MD;  Location: [facility];  Service: Endoscopy;  Laterality: N/A;  . ESOPHAGOGASTRODUODENOSCOPY (EGD) WITH PROPOFOL N/A 02/24/2016   Procedure: ESOPHAGOGASTRODUODENOSCOPY (EGD) WITH PROPOFOL;  Surgeon: [name], MD;  Location: [place];  Service: Endoscopy;  Laterality: N/A;  diabetic - oral med  . POLYPECTOMY N/A 12/09/2015   Procedure: POLYPECTOMY;  Surgeon: [name], MD;  Location: [place];  Service: Endoscopy;  Laterality: N/A;  . TONSILLECTOMY       Family History: Family History  Problem Relation Age of Onset  . Aneurysm Father 71       of the heart  . Heart attack Father   . Diabetes Mother      Social History: Social History   Socioeconomic History  . Marital status: Married    Spouse name: Not on file  . Number of children: 2  . Years of education: Not on file  . Highest education level: Not on file  Occupational History  . Occupation: Engineer, building services: [employer]  Social Needs  . Financial resource strain: Not on file  . Food insecurity:    Worry: Not on file    Inability: Not on file  . Transportation needs:    Medical: Not on file    Non-medical: Not on file  Tobacco Use  . Smoking status: Never Smoker  . Smokeless tobacco: Never Used  Substance  and Sexual Activity  . Alcohol use: No    Alcohol/week: 0.0 oz  . Drug use: No  . Sexual activity: Not on file  Lifestyle  . Physical activity:    Days per week: Not on file    Minutes per session: Not on file  . Stress: Not on file  Relationships  . Social connections:    Talks on phone: Not on file    Gets together: Not on file    Attends religious service: Not on file    Active member of club or organization: Not on file    Attends meetings of clubs or organizations: Not on file    Relationship status: Not on file  . Intimate partner violence:    Fear of current or ex partner: Not on file    Emotionally abused: Not on file    Physically abused: Not on file    Forced sexual activity: Not on file  Other Topics Concern  . Not on file  Social History Narrative  . Not on file     Review of Systems: Patient unable to provide review systems as he is currently intubated and sedated  Vital Signs: Blood pressure (!) 114/57, pulse (!) 115, temperature (!) 96.2 F (35.7 C), temperature source Oral, resp. rate 15, height 5' 9"  (1.753 m), weight 82.8 kg (182 lb 8.7 oz), SpO2 99 %.  Weight trends: Filed Weights   06/14/2017 2250 06/01/2017 0309 06/05/17 0408  Weight: 76.2 kg (168 lb) 74.6 kg (164 lb 7.4 oz) 82.8 kg (182 lb 8.7 oz)    Physical Exam: General: Critically ill appearing   Head: Normocephalic, atraumatic.  Eyes: Anicteric, spotaneous EOMs noted  Nose: Mucous membranes moist, not inflammed, nonerythematous.  Throat: ETT in place  Neck: Supple, trachea midline.  Lungs:  Vent assisted  Heart: S1S2 tachycardic  Abdomen:  Mild distension, soft, BS present  Extremities: No pretibial edema.  Neurologic: Intubated/sedated  Skin: No visible rashes, scars.    Lab results: Basic Metabolic Panel: Recent Labs  Lab 05/27/2017 0734 05/22/2017 1227  05/30/2017 0337  06/10/2017 1900 05/29/2017 2240 06/05/17 0326  NA 137 138   < > 138   < > 140 136 135  K 5.9* 5.9*   < > 5.1   < >  6.4* 5.8* 4.7  CL 117* 115*   < > 116*   < > 120* 116* 112*  CO2 16* 17*   < > 18*   < > 16* 15* 18*  GLUCOSE 267* 273*   < > 114*   < > 245* 198* 115*  BUN 43* 46*   < > 51*   < > 52* 56* 43*  CREATININE 1.64* 1.79*   < > 1.97*   < > 2.87* 3.13* 2.62*  CALCIUM 8.4* 8.4*   < > 8.3*   < > 7.4* 7.4* 7.7*  MG 1.2* 1.7  --   --   --   --   --   --   PHOS 3.9  --   --  3.8  --   --  7.2* 5.6*   < > = values in this interval not displayed.    Liver Function Tests: Recent Labs  Lab 05/28/2017 2244 05/24/2017 0734  06/13/2017 1714 06/08/2017 2240 06/05/17 0326  AST 25 25  --  41  --   --   ALT 18 16*  --  29  --   --   ALKPHOS 59 51  --  47  --   --   BILITOT 0.7 0.8  --  0.8  --   --   PROT 5.9* 5.5*  --  4.4*  --   --   ALBUMIN 2.3* 2.2*   < > 1.9* 1.8* 1.9*   < > = values in this interval not displayed.   Recent Labs  Lab 06/05/2017 2244  LIPASE 46   Recent Labs  Lab 06/13/2017 1903 06/05/17 0410  AMMONIA 31 65*    CBC: Recent Labs  Lab 05/30/17 1120 05/26/2017 2244 06/08/2017 0226 06/05/2017 0734  05/29/2017 0337 06/10/2017 0707 05/26/2017 1346  WBC 5.7 11.1* 14.9* 10.5  --   --   --   --   NEUTROABS 3.0  --   --   --   --   --   --   --   HGB 10.6* 8.0* 6.2* 7.8*   < > 7.2* 7.1* 8.3*  HCT 31.0* 23.4*  18.6* 22.6*  --   --   --   --   MCV 86.5 87.9 89.1 85.3  --   --   --   --   PLT 83* 146* 163 115*  --   --   --   --    < > = values in this interval not displayed.    Cardiac Enzymes: No results for input(s): CKTOTAL, CKMB, CKMBINDEX, TROPONINI in the last 168 hours.  BNP: Invalid input(s): POCBNP  CBG: Recent Labs  Lab 05/21/2017 1620 06/16/2017 2025 06/05/17 0034 06/05/17 0359 06/05/17 0741  GLUCAP 275* 198* 137* 98 59    Microbiology: Results for orders placed or performed during the hospital encounter of 06/10/2017  MRSA PCR Screening     Status: None   Collection Time: 06/17/2017  3:01 AM  Result Value Ref Range Status   MRSA by PCR NEGATIVE NEGATIVE Final     Comment:        The GeneXpert MRSA Assay (FDA approved for NASAL specimens only), is one component of a comprehensive MRSA colonization surveillance program. It is not intended to diagnose MRSA infection nor to guide or monitor treatment for MRSA infections. Performed at [facility], [address] [place], [place] [address]     Coagulation Studies: Recent Labs    05/27/2017 2244 06/01/2017 0734  LABPROT 16.1* 16.8*  INR 1.30 1.37    Urinalysis: No results for input(s): COLORURINE, LABSPEC, PHURINE, GLUCOSEU, HGBUR, BILIRUBINUR, KETONESUR, PROTEINUR, UROBILINOGEN, NITRITE, LEUKOCYTESUR in the last 72 hours.  Invalid input(s): APPERANCEUR    Imaging: Dg Chest Port 1 View  Result Date: 06/14/2017 CLINICAL DATA:  Central line placement EXAM: PORTABLE CHEST 1 VIEW COMPARISON:  05/22/2017, 01/20/2017 FINDINGS: Endotracheal tube tip is about 3.9 cm superior to the carina. Left-sided central venous catheter tip superimposes the SVC origin. Right-sided central venous catheter tip overlies the right atrium. No right pneumothorax is seen. Cardiomegaly. Tiny left pleural effusion. Increasing atelectasis or infiltrate at the left base. Aortic atherosclerosis. Air-filled dilated bowel in the upper abdomen. IMPRESSION: 1. New right-sided central venous catheter with tip superimposing the right atrium. Negative for a pneumothorax 2. Remaining support lines and tubes as above 3. Cardiomegaly with tiny left pleural effusion. Increasing atelectasis or infiltrate at the left base. Electronically Signed   By: Donavan Foil M.D.   On: 06/05/2017 21:31   Dg Chest Port 1 View  Result Date: 06/17/2017 CLINICAL DATA:  Left internal jugular catheter placement. EXAM: PORTABLE CHEST 1 VIEW COMPARISON:  Earlier today. FINDINGS: Interval left jugular catheter with its tip in the proximal superior vena cava. No pneumothorax. Endotracheal tube in satisfactory position. Poor inspiration. Stable enlarged  cardiac silhouette. Minimal atelectasis at both lung bases. Thoracic spine degenerative changes. IMPRESSION: 1. Left jugular catheter tip in the proximal superior vena cava. 2. Stable cardiomegaly, poor inspiration and minimal bibasilar atelectasis. Electronically Signed   By: Claudie Revering M.D.   On: 05/23/2017 19:09   Dg Chest Port 1 View  Result Date: 06/18/2017 CLINICAL DATA:  Status post intubation. EXAM: PORTABLE CHEST 1 VIEW COMPARISON:  01/20/2017. FINDINGS: ETT 3.6 cm above carina. Cardiomegaly. Low lung volumes. No consolidation or edema. Calcified tortuous aorta. No osseous findings. IMPRESSION: ET tube in satisfactory position. Low lung volumes. Cardiomegaly without definite consolidation or edema. Electronically Signed   By: Staci Righter M.D.   On: 06/08/2017 15:04      Assessment & Plan: Pt is a 77 y.o. male  with a PMHx of  diabetes mellitus type 2, hypertension, hyperlipidemia, coronary artery disease with history of MI, multiple myeloma, iron deficiency anemia, cirrhosis of the liver secondary to nonalcoholic fatty liver disease, GERD, nephrolithiasis, chronic kidney disease stage III, who was admitted to [facility] on 06/08/2017 for evaluation of gastrointestinal bleeding.  Initial workup has revealed multiple bleeding varices.  1.  Acute renal failure secondary to GI bleed, hypotension. 2.  Chronic kidney disease stage III. 3.  Acute respiratory failure. 4.  Hyperkalemia. 5.  Metabolic acidosis. 6.  Multiple myeloma. 7.  Nonalcoholic fatty liver disease with cirrhosis. 8.  Bleeding esophageal varices.  Plan: Patient is critically ill at the moment.  His acute renal failure secondary to GI bleed, volume depletion, and hypotension.  He has been initiated on CRRT and is tolerating well.  Hyperkalemia has significantly improved and potassium is down to 4.7.  He still has mild metabolic acidosis but serum bicarbonate is up to 18.  We will continue to monitor serum electrolytes closely.   Overall patient has a very guarded prognosis.  Further plan as patient progresses.

## 2017-06-05 NOTE — Progress Notes (Signed)
Upon nurse request, chaplain provided support to wife (of 49 years, have known each other since age of 28), sons and youngest of four grandchild following Zachary Buck's awaking with behaviors uncharacteristic. Rush Landmark is a Estate agent as is son. Family offered prayer. Chaplain offered emotional support and reinforced importance of self-care.     06/05/17 1900  Clinical Encounter Type  Visited With Patient and family together  Visit Type Follow-up  Referral From Nurse  Consult/Referral To Chaplain  Spiritual Encounters  Spiritual Needs Emotional  Stress Factors  Patient Stress Factors Health changes;Exhausted

## 2017-06-06 ENCOUNTER — Encounter: Payer: Self-pay | Admitting: Gastroenterology

## 2017-06-06 LAB — BLOOD GAS, ARTERIAL
ACID-BASE DEFICIT: 3.8 mmol/L — AB (ref 0.0–2.0)
Bicarbonate: 22.6 mmol/L (ref 20.0–28.0)
FIO2: 0.3
O2 Saturation: 94 %
PCO2 ART: 47 mmHg (ref 32.0–48.0)
PEEP: 2 cmH2O
PH ART: 7.29 — AB (ref 7.350–7.450)
Patient temperature: 37
Pressure support: 5 cmH2O
pO2, Arterial: 79 mmHg — ABNORMAL LOW (ref 83.0–108.0)

## 2017-06-06 LAB — PREPARE RBC (CROSSMATCH)

## 2017-06-06 LAB — HEMOGLOBIN AND HEMATOCRIT, BLOOD
HCT: 21.1 % — ABNORMAL LOW (ref 40.0–52.0)
HEMATOCRIT: 21.6 % — AB (ref 40.0–52.0)
HEMATOCRIT: 25.5 % — AB (ref 40.0–52.0)
Hemoglobin: 7 g/dL — ABNORMAL LOW (ref 13.0–18.0)
Hemoglobin: 6.9 g/dL — ABNORMAL LOW (ref 13.0–18.0)
Hemoglobin: 8.3 g/dL — ABNORMAL LOW (ref 13.0–18.0)

## 2017-06-06 LAB — RENAL FUNCTION PANEL
ALBUMIN: 1.5 g/dL — AB (ref 3.5–5.0)
ALBUMIN: 1.5 g/dL — AB (ref 3.5–5.0)
ALBUMIN: 1.7 g/dL — AB (ref 3.5–5.0)
ALBUMIN: 1.7 g/dL — AB (ref 3.5–5.0)
ANION GAP: 8 (ref 5–15)
ANION GAP: 8 (ref 5–15)
ANION GAP: 8 (ref 5–15)
Albumin: 1.5 g/dL — ABNORMAL LOW (ref 3.5–5.0)
Albumin: 1.5 g/dL — ABNORMAL LOW (ref 3.5–5.0)
Anion gap: 10 (ref 5–15)
Anion gap: 6 (ref 5–15)
Anion gap: 7 (ref 5–15)
BUN: 27 mg/dL — AB (ref 6–20)
BUN: 29 mg/dL — ABNORMAL HIGH (ref 6–20)
BUN: 28 mg/dL — ABNORMAL HIGH (ref 6–20)
BUN: 30 mg/dL — ABNORMAL HIGH (ref 6–20)
BUN: 30 mg/dL — AB (ref 6–20)
BUN: 31 mg/dL — AB (ref 6–20)
CALCIUM: 7.2 mg/dL — AB (ref 8.9–10.3)
CALCIUM: 7.1 mg/dL — AB (ref 8.9–10.3)
CALCIUM: 6.8 mg/dL — AB (ref 8.9–10.3)
CALCIUM: 6.9 mg/dL — AB (ref 8.9–10.3)
CHLORIDE: 107 mmol/L (ref 101–111)
CHLORIDE: 103 mmol/L (ref 101–111)
CHLORIDE: 107 mmol/L (ref 101–111)
CO2: 21 mmol/L — ABNORMAL LOW (ref 22–32)
CO2: 22 mmol/L (ref 22–32)
CO2: 25 mmol/L (ref 22–32)
CO2: 21 mmol/L — ABNORMAL LOW (ref 22–32)
CO2: 24 mmol/L (ref 22–32)
CO2: 26 mmol/L (ref 22–32)
CREATININE: 2.18 mg/dL — AB (ref 0.61–1.24)
CREATININE: 2.11 mg/dL — AB (ref 0.61–1.24)
CREATININE: 2.19 mg/dL — AB (ref 0.61–1.24)
Calcium: 6.9 mg/dL — ABNORMAL LOW (ref 8.9–10.3)
Calcium: 7.1 mg/dL — ABNORMAL LOW (ref 8.9–10.3)
Chloride: 106 mmol/L (ref 101–111)
Chloride: 106 mmol/L (ref 101–111)
Chloride: 103 mmol/L (ref 101–111)
Creatinine, Ser: 2.29 mg/dL — ABNORMAL HIGH (ref 0.61–1.24)
Creatinine, Ser: 2.11 mg/dL — ABNORMAL HIGH (ref 0.61–1.24)
Creatinine, Ser: 2.06 mg/dL — ABNORMAL HIGH (ref 0.61–1.24)
GFR calc Af Amer: 30 mL/min — ABNORMAL LOW (ref >60)
GFR calc Af Amer: 33 mL/min — ABNORMAL LOW (ref >60)
GFR calc Af Amer: 34 mL/min — ABNORMAL LOW (ref >60)
GFR calc Af Amer: 32 mL/min — ABNORMAL LOW (ref >60)
GFR calc non Af Amer: 26 mL/min — ABNORMAL LOW (ref >60)
GFR calc non Af Amer: 29 mL/min — ABNORMAL LOW (ref >60)
GFR calc non Af Amer: 29 mL/min — ABNORMAL LOW (ref >60)
GFR calc non Af Amer: 27 mL/min — ABNORMAL LOW (ref >60)
GFR, EST AFRICAN AMERICAN: 32 mL/min — AB (ref >60)
GFR, EST AFRICAN AMERICAN: 33 mL/min — AB (ref >60)
GFR, EST NON AFRICAN AMERICAN: 27 mL/min — AB (ref >60)
GFR, EST NON AFRICAN AMERICAN: 29 mL/min — AB (ref >60)
GLUCOSE: 259 mg/dL — AB (ref 65–99)
GLUCOSE: 163 mg/dL — AB (ref 65–99)
GLUCOSE: 182 mg/dL — AB (ref 65–99)
Glucose, Bld: 134 mg/dL — ABNORMAL HIGH (ref 65–99)
Glucose, Bld: 154 mg/dL — ABNORMAL HIGH (ref 65–99)
Glucose, Bld: 139 mg/dL — ABNORMAL HIGH (ref 65–99)
PHOSPHORUS: 5.9 mg/dL — AB (ref 2.5–4.6)
PHOSPHORUS: 5.6 mg/dL — AB (ref 2.5–4.6)
PHOSPHORUS: 5.1 mg/dL — AB (ref 2.5–4.6)
PHOSPHORUS: 5.4 mg/dL — AB (ref 2.5–4.6)
POTASSIUM: 4 mmol/L (ref 3.5–5.1)
POTASSIUM: 3.6 mmol/L (ref 3.5–5.1)
Phosphorus: 5.7 mg/dL — ABNORMAL HIGH (ref 2.5–4.6)
Phosphorus: 5.1 mg/dL — ABNORMAL HIGH (ref 2.5–4.6)
Potassium: 4.5 mmol/L (ref 3.5–5.1)
Potassium: 4.4 mmol/L (ref 3.5–5.1)
Potassium: 3.6 mmol/L (ref 3.5–5.1)
Potassium: 3.9 mmol/L (ref 3.5–5.1)
SODIUM: 136 mmol/L (ref 135–145)
SODIUM: 136 mmol/L (ref 135–145)
SODIUM: 136 mmol/L (ref 135–145)
Sodium: 138 mmol/L (ref 135–145)
Sodium: 136 mmol/L (ref 135–145)
Sodium: 136 mmol/L (ref 135–145)

## 2017-06-06 LAB — GLUCOSE, CAPILLARY
GLUCOSE-CAPILLARY: 157 mg/dL — AB (ref 65–99)
GLUCOSE-CAPILLARY: 169 mg/dL — AB (ref 65–99)
Glucose-Capillary: 131 mg/dL — ABNORMAL HIGH (ref 65–99)
Glucose-Capillary: 154 mg/dL — ABNORMAL HIGH (ref 65–99)
Glucose-Capillary: 160 mg/dL — ABNORMAL HIGH (ref 65–99)
Glucose-Capillary: 162 mg/dL — ABNORMAL HIGH (ref 65–99)

## 2017-06-06 LAB — AMMONIA: Ammonia: 67 umol/L — ABNORMAL HIGH (ref 9–35)

## 2017-06-06 LAB — HEMOGLOBIN: Hemoglobin: 8.2 g/dL — ABNORMAL LOW (ref 13.0–18.0)

## 2017-06-06 MED ORDER — NALOXONE HCL 0.4 MG/ML IJ SOLN: 0.4000 mg | INTRAMUSCULAR | Status: DC | PRN

## 2017-06-06 MED ORDER — IPRATROPIUM-ALBUTEROL 0.5-2.5 (3) MG/3ML IN SOLN
3.0000 mL | RESPIRATORY_TRACT | Status: DC
  Administered 2017-06-06 – 2017-06-07 (×3): 3 mL via RESPIRATORY_TRACT
  Filled 2017-06-06 (×3): qty 3

## 2017-06-06 MED ORDER — MORPHINE SULFATE (PF) 2 MG/ML IV SOLN: 0.5000 mg | INTRAVENOUS | Status: DC | PRN

## 2017-06-06 MED ORDER — NALOXONE HCL 0.4 MG/ML IJ SOLN
INTRAMUSCULAR | Status: AC
  Administered 2017-06-06: 0.2 mg via INTRAVENOUS
  Filled 2017-06-06: qty 1

## 2017-06-06 MED ORDER — PUREFLOW DIALYSIS SOLUTION
INTRAVENOUS | Status: DC
  Administered 2017-06-06 (×2): 3 via INTRAVENOUS_CENTRAL

## 2017-06-06 MED ORDER — FLUMAZENIL 0.5 MG/5ML IV SOLN
0.2000 mg | Freq: Once | INTRAVENOUS | Status: AC
  Administered 2017-06-06: 0.2 mg via INTRAVENOUS

## 2017-06-06 MED ORDER — SODIUM CHLORIDE 0.9 % IV SOLN
Freq: Once | INTRAVENOUS | Status: AC
  Administered 2017-06-06: 15:00:00 via INTRAVENOUS

## 2017-06-06 MED ORDER — NALOXONE HCL 0.4 MG/ML IJ SOLN
0.2000 mg | INTRAMUSCULAR | Status: DC | PRN
  Administered 2017-06-06 (×2): 0.2 mg via INTRAVENOUS

## 2017-06-06 MED ORDER — DEXMEDETOMIDINE HCL IN NACL 400 MCG/100ML IV SOLN
0.4000 ug/kg/h | INTRAVENOUS | Status: DC
  Administered 2017-06-06 – 2017-06-07 (×2): 0.6 ug/kg/h via INTRAVENOUS
  Filled 2017-06-06 (×2): qty 100

## 2017-06-06 MED ORDER — BUDESONIDE 0.25 MG/2ML IN SUSP
0.2500 mg | Freq: Two times a day (BID) | RESPIRATORY_TRACT | Status: DC
  Administered 2017-06-06: 0.25 mg via RESPIRATORY_TRACT
  Filled 2017-06-06: qty 2

## 2017-06-06 MED ORDER — MORPHINE SULFATE (PF) 2 MG/ML IV SOLN: 1.0000 mg | INTRAVENOUS | Status: DC | PRN

## 2017-06-06 MED ORDER — FLUMAZENIL 0.5 MG/5ML IV SOLN
INTRAVENOUS | Status: AC
  Administered 2017-06-06: 0.2 mg via INTRAVENOUS
  Filled 2017-06-06: qty 5

## 2017-06-06 NOTE — Progress Notes (Signed)
[facility]  ROUNDING NOTE   Subjective:  Patient remains critically ill. No urine output noted. Appears to be tolerating CRRT well at the moment.   Objective:  Vital signs in last 24 hours:  Temp:  [94.6 F (34.8 C)-97.7 F (36.5 C)] 96.3 F (35.7 C) (04/17 0900) Pulse Rate:  [105-124] 106 (04/17 0900) Resp:  [14-18] 18 (04/17 0900) BP: (81-140)/(51-66) 123/54 (04/17 0900) SpO2:  [93 %-100 %] 100 % (04/17 0900) FiO2 (%):  [30 %] 30 % (04/17 0815) Weight:  [89.1 kg (196 lb 6.9 oz)] 89.1 kg (196 lb 6.9 oz) (04/17 0420)  Weight change: 6.3 kg (13 lb 14.2 oz) Filed Weights   05/30/2017 0309 06/05/17 0408 06/06/17 0420  Weight: 74.6 kg (164 lb 7.4 oz) 82.8 kg (182 lb 8.7 oz) 89.1 kg (196 lb 6.9 oz)    Intake/Output: I/O last 3 completed shifts: In: 13216.5 [I.V.:12866.7; IV Piggyback:349.8] Out: 160 [Urine:160]   Intake/Output this shift:  Total I/O In: 639.9 [I.V.:639.9] Out: 0   Physical Exam: General: Critically ill-appearing  Head: Endotracheal tube in place  Eyes: Anicteric  Neck: Supple, trachea midline  Lungs:   Bilateral rhonchi, vent assisted  Heart: S1S2 no rubs  Abdomen:  Soft, nontender, bowel sounds present  Extremities: 1+ peripheral edema.  Neurologic: Intubated, sedated  Skin: No lesions  Access: Right IJ temporary dialysis catheter    Basic Metabolic Panel: Recent Labs  Lab 06/11/2017 0734 05/27/2017 1227  06/05/17 1408 06/05/17 1750 06/05/17 2157 06/06/17 0220 06/06/17 0607  NA 137 138   < > 134* 135 137 138 136  K 5.9* 5.9*   < > 4.3 4.3 4.5 4.5 4.4  CL 117* 115*   < > 108 109 107 107 106  CO2 16* 17*   < > 18* 17* 20* 21* 22  GLUCOSE 267* 273*   < > 65 81 109* 134* 154*  BUN 43* 46*   < > 30* 28* 27* 27* 29*  CREATININE 1.64* 1.79*   < > 2.11* 2.16* 2.14* 2.18* 2.29*  CALCIUM 8.4* 8.4*   < > 6.7* 6.5* 7.1* 7.2* 7.1*  MG 1.2* 1.7  --   --   --   --   --   --   PHOS 3.9  --    < > 5.9* 5.8* 6.2* 5.9* 5.7*   < > = values in  this interval not displayed.    Liver Function Tests: Recent Labs  Lab 05/30/17 1120 05/23/2017 2244 06/09/2017 0734  06/06/2017 1714  06/05/17 1408 06/05/17 1750 06/05/17 2157 06/06/17 0220 06/06/17 0607  AST 28 25 25   --  41  --   --   --   --   --   --   ALT 23 18 16*  --  29  --   --   --   --   --   --   ALKPHOS 78 59 51  --  47  --   --   --   --   --   --   BILITOT 1.1 0.7 0.8  --  0.8  --   --   --   --   --   --   PROT 8.0 5.9* 5.5*  --  4.4*  --   --   --   --   --   --   ALBUMIN 3.1* 2.3* 2.2*   < > 1.9*   < > 1.6* 1.5* 1.5* 1.5* 1.5*   < > =  values in this interval not displayed.   Recent Labs  Lab 06/05/2017 2244  LIPASE 46   Recent Labs  Lab 06/05/17 0410 06/05/17 1527 06/06/17 0800  AMMONIA 65* 70* 67*    CBC: Recent Labs  Lab 05/30/17 1120 06/12/2017 2244 05/21/2017 0226 05/27/2017 0734  06/11/2017 2239 05/25/2017 0337 06/14/2017 0707 06/19/2017 1346 06/05/17 0937  WBC 5.7 11.1* 14.9* 10.5  --   --   --   --   --  33.7*  NEUTROABS 3.0  --   --   --   --   --   --   --   --  28.7*  HGB 10.6* 8.0* 6.2* 7.8*   < > 7.0* 7.2* 7.1* 8.3* 9.1*  HCT 31.0* 23.4* 18.6* 22.6*  --   --   --   --   --  27.5*  MCV 86.5 87.9 89.1 85.3  --   --   --   --   --  87.6  PLT 83* 146* 163 115*  --   --   --   --   --  93*   < > = values in this interval not displayed.    Cardiac Enzymes: No results for input(s): CKTOTAL, CKMB, CKMBINDEX, TROPONINI in the last 168 hours.  BNP: Invalid input(s): POCBNP  CBG: Recent Labs  Lab 06/05/17 1553 06/05/17 2039 06/05/17 2327 06/06/17 0411 06/06/17 0731  GLUCAP 79 41 101* 131* 154*    Microbiology: Results for orders placed or performed during the hospital encounter of 06/11/2017  MRSA PCR Screening     Status: None   Collection Time: 05/23/2017  3:01 AM  Result Value Ref Range Status   MRSA by PCR NEGATIVE NEGATIVE Final    Comment:        The GeneXpert MRSA Assay (FDA approved for NASAL specimens only), is one component of  a comprehensive MRSA colonization surveillance program. It is not intended to diagnose MRSA infection nor to guide or monitor treatment for MRSA infections. Performed at [facility], [address] [place], [place] [address]     Coagulation Studies: No results for input(s): LABPROT, INR in the last 72 hours.  Urinalysis: No results for input(s): COLORURINE, LABSPEC, PHURINE, GLUCOSEU, HGBUR, BILIRUBINUR, KETONESUR, PROTEINUR, UROBILINOGEN, NITRITE, LEUKOCYTESUR in the last 72 hours.  Invalid input(s): APPERANCEUR    Imaging: Dg Chest Port 1 View  Result Date: 06/10/2017 CLINICAL DATA:  Central line placement EXAM: PORTABLE CHEST 1 VIEW COMPARISON:  06/01/2017, 01/20/2017 FINDINGS: Endotracheal tube tip is about 3.9 cm superior to the carina. Left-sided central venous catheter tip superimposes the SVC origin. Right-sided central venous catheter tip overlies the right atrium. No right pneumothorax is seen. Cardiomegaly. Tiny left pleural effusion. Increasing atelectasis or infiltrate at the left base. Aortic atherosclerosis. Air-filled dilated bowel in the upper abdomen. IMPRESSION: 1. New right-sided central venous catheter with tip superimposing the right atrium. Negative for a pneumothorax 2. Remaining support lines and tubes as above 3. Cardiomegaly with tiny left pleural effusion. Increasing atelectasis or infiltrate at the left base. Electronically Signed   By: Donavan Foil M.D.   On: 06/01/2017 21:31   Dg Chest Port 1 View  Result Date: 05/26/2017 CLINICAL DATA:  Left internal jugular catheter placement. EXAM: PORTABLE CHEST 1 VIEW COMPARISON:  Earlier today. FINDINGS: Interval left jugular catheter with its tip in the proximal superior vena cava. No pneumothorax. Endotracheal tube in satisfactory position. Poor inspiration. Stable enlarged cardiac silhouette. Minimal atelectasis at both lung  bases. Thoracic spine degenerative changes. IMPRESSION: 1. Left jugular catheter  tip in the proximal superior vena cava. 2. Stable cardiomegaly, poor inspiration and minimal bibasilar atelectasis. Electronically Signed   By: Claudie Revering M.D.   On: 05/23/2017 19:09   Dg Chest Port 1 View  Result Date: 06/01/2017 CLINICAL DATA:  Status post intubation. EXAM: PORTABLE CHEST 1 VIEW COMPARISON:  01/20/2017. FINDINGS: ETT 3.6 cm above carina. Cardiomegaly. Low lung volumes. No consolidation or edema. Calcified tortuous aorta. No osseous findings. IMPRESSION: ET tube in satisfactory position. Low lung volumes. Cardiomegaly without definite consolidation or edema. Electronically Signed   By: Staci Righter M.D.   On: 05/24/2017 15:04     Medications:   . cefTRIAXone (ROCEPHIN)  IV Stopped (06/05/17 2049)  . fentaNYL infusion INTRAVENOUS 100 mcg/hr (06/06/17 0800)  . norepinephrine (LEVOPHED) Adult infusion 18.027 mcg/min (06/06/17 0800)  . octreotide  (SANDOSTATIN)    IV infusion 50 mcg/hr (06/06/17 0800)  . phenylephrine (NEO-SYNEPHRINE) Adult infusion 60 mcg/min (06/06/17 0800)  . pureflow 2,000 mL/hr at 06/06/17 0311  .  sodium bicarbonate  infusion 1000 mL 100 mL/hr at 06/06/17 0800  . vasopressin (PITRESSIN) infusion - *FOR SHOCK* 0.03 Units/min (06/06/17 0800)   . chlorhexidine gluconate (MEDLINE KIT)  15 mL Mouth Rinse BID  . hydrocortisone sod succinate (SOLU-CORTEF) inj  50 mg Intravenous Q6H  . insulin aspart  0-15 Units Subcutaneous Q4H  . mouth rinse  15 mL Mouth Rinse 10 times per day  . pantoprazole (PROTONIX) IV  40 mg Intravenous Q12H  . rifaximin  550 mg Oral BID   acetaminophen, bisacodyl, fentaNYL, heparin, midazolam, ondansetron **OR** ondansetron (ZOFRAN) IV, senna-docusate  Assessment/ Plan:  77 y.o. 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  Initial workup has revealed multiple bleeding varices.  1.  Acute renal failure secondary to GI bleed, hypotension. 2.  Chronic kidney disease stage III. 3.  Acute respiratory failure. 4.  Hyperkalemia. 5.  Metabolic acidosis. 6.  Multiple myeloma. 7.  Nonalcoholic fatty liver disease with cirrhosis. 8.  Bleeding esophageal varices.  Plan: Patient seen and evaluated at bedside this a.m.  He remains critically ill.  No significant urine output noted.  Continue CRRT.  He is significantly positive over the past 2 days.  We will add ultrafiltration of 150 cc/h today.  Pressures may need to be increased after addition of ultrafiltration.  Continue to monitor serum electrolytes.  Overall prognosis very guarded.    LOS: 3 Zachary Buck 4/17/20199:25 AM

## 2017-06-06 NOTE — Progress Notes (Signed)
Pt has coarse crackles throughout lungs. MD notified. Duonebs and pulmicort breathing treatments ordered. NTS prn ordered per MD as well.

## 2017-06-06 NOTE — Plan of Care (Signed)
Pt's careplan updated. 

## 2017-06-06 NOTE — Progress Notes (Signed)
Pharmacy Electrolyte Monitoring Consult:  Pharmacy consulted to assist in monitoring CRRT medication adjustment  in this 77 y.o. male admitted on 06/08/2017 with GI Bleeding  Patient requiring CRRT. Patient extubated during afternoon on 4/17.   Labs:  Sodium (mmol/L)  Date Value  06/06/2017 136   Potassium (mmol/L)  Date Value  06/06/2017 3.9   Magnesium (mg/dL)  Date Value  05/29/2017 1.7   Phosphorus (mg/dL)  Date Value  06/06/2017 5.1 (H)   Calcium (mg/dL)  Date Value  06/06/2017 6.9 (L)   Albumin (g/dL)  Date Value  06/06/2017 1.7 (L)  11/18/2013 3.3 (L)    Plan: No adjustments required at this time,   Pharmacy will continue to monitor and adjust per consult.   Zachary Buck L 06/06/2017 10:34 PM

## 2017-06-06 NOTE — Progress Notes (Signed)
   06/06/17 1519  Clinical Encounter Type  Visited With Family  Visit Type Social support   While on unit, patient son stopped this chaplain to inform her that people from church would be gathering this evening and to ask if they could use the chapel.  Chaplain stated that chapel was available for their use and asked if any support would be helpful; son declined.

## 2017-06-06 NOTE — Progress Notes (Addendum)
Patient has been off precedex since 1545. Pt becoming more responsive. Still not following commands. Family states that he is seems to be answering questions sporadically. Pt on a venti mask because he breathes from his mouth. Neo has been turned off and levo has been titrated down to 71mcg/hr. Vasopressin still at .03 for a MAP goal of 65. Hemoglobin up to 8.3 after blood transfusion. PT has been tolerating CRRT. PT has not has any more BM's since this afternoon. Family at bedside.

## 2017-06-06 NOTE — Progress Notes (Signed)
[facility] [place] NAME: Zachary Buck    MR#:  [id number]  DATE OF BIRTH:  1940-11-23  SUBJECTIVE:  CHIEF COMPLAINT:   Chief Complaint  Patient presents with  . GI Bleeding  Remains critically ill, and uric, on CRRT, on pressors, intubated  REVIEW OF SYSTEMS:  CONSTITUTIONAL: No fever, fatigue or weakness.  EYES: No blurred or double vision.  EARS, NOSE, AND THROAT: No tinnitus or ear pain.  RESPIRATORY: No cough, shortness of breath, wheezing or hemoptysis.  CARDIOVASCULAR: No chest pain, orthopnea, edema.  GASTROINTESTINAL: No nausea, vomiting, diarrhea or abdominal pain.  GENITOURINARY: No dysuria, hematuria.  ENDOCRINE: No polyuria, nocturia,  HEMATOLOGY: No anemia, easy bruising or bleeding SKIN: No rash or lesion. MUSCULOSKELETAL: No joint pain or arthritis.   NEUROLOGIC: No tingling, numbness, weakness.  PSYCHIATRY: No anxiety or depression.   ROS  DRUG ALLERGIES:   Allergies  Allergen Reactions  . Niaspan [Niacin Er] Other (See Comments)    Just does not want to take     VITALS:  Blood pressure (!) 128/56, pulse (!) 107, temperature (!) 96.6 F (35.9 C), resp. rate 10, height 5\' 9"  (1.753 m), weight 89.1 kg (196 lb 6.9 oz), SpO2 97 %.  PHYSICAL EXAMINATION:  GENERAL:  77 y.o.-year-old patient lying in the bed with no acute distress.  EYES: Pupils equal, round, reactive to light and accommodation. No scleral icterus. Extraocular muscles intact.  HEENT: Head atraumatic, normocephalic. Oropharynx and nasopharynx clear.  NECK:  Supple, no jugular venous distention. No thyroid enlargement, no tenderness.  LUNGS: Normal breath sounds bilaterally, no wheezing, rales,rhonchi or crepitation. No use of accessory muscles of respiration.  CARDIOVASCULAR: S1, S2 normal. No murmurs, rubs, or gallops.  ABDOMEN: Soft, nontender, nondistended. Bowel sounds present. No organomegaly or mass.  EXTREMITIES: No pedal edema,  cyanosis, or clubbing.  NEUROLOGIC: Cranial nerves II through XII are intact. Muscle strength 5/5 in all extremities. Sensation intact. Gait not checked.  PSYCHIATRIC: The patient is alert and oriented x 3.  SKIN: No obvious rash, lesion, or ulcer.   Physical Exam LABORATORY PANEL:   CBC Recent Labs  Lab 06/05/17 0937  06/06/17 0956  WBC 33.7*  --   --   HGB 9.1*   < > 6.9*  HCT 27.5*   < > 21.1*  PLT 93*  --   --    < > = values in this interval not displayed.   ------------------------------------------------------------------------------------------------------------------  Chemistries  Recent Labs  Lab 06/01/2017 1227  06/05/2017 1714  06/06/17 0956  NA 138   < >  --    < > 136  K 5.9*   < > 6.7*   < > 4.0  CL 115*   < >  --    < > 103  CO2 17*   < >  --    < > 25  GLUCOSE 273*   < >  --    < > 259*  BUN 46*   < >  --    < > 28*  CREATININE 1.79*   < >  --    < > 2.11*  CALCIUM 8.4*   < >  --    < > 6.9*  MG 1.7  --   --   --   --   AST  --   --  41  --   --   ALT  --   --  29  --   --  ALKPHOS  --   --  47  --   --   BILITOT  --   --  0.8  --   --    < > = values in this interval not displayed.   ------------------------------------------------------------------------------------------------------------------  Cardiac Enzymes No results for input(s): TROPONINI in the last 168 hours. ------------------------------------------------------------------------------------------------------------------  RADIOLOGY:  Dg Chest Port 1 View  Result Date: 06/16/2017 CLINICAL DATA:  Central line placement EXAM: PORTABLE CHEST 1 VIEW COMPARISON:  06/18/2017, 01/20/2017 FINDINGS: Endotracheal tube tip is about 3.9 cm superior to the carina. Left-sided central venous catheter tip superimposes the SVC origin. Right-sided central venous catheter tip overlies the right atrium. No right pneumothorax is seen. Cardiomegaly. Tiny left pleural effusion. Increasing atelectasis or  infiltrate at the left base. Aortic atherosclerosis. Air-filled dilated bowel in the upper abdomen. IMPRESSION: 1. New right-sided central venous catheter with tip superimposing the right atrium. Negative for a pneumothorax 2. Remaining support lines and tubes as above 3. Cardiomegaly with tiny left pleural effusion. Increasing atelectasis or infiltrate at the left base. Electronically Signed   By: Donavan Foil M.D.   On: 06/19/2017 21:31   Dg Chest Port 1 View  Result Date: 06/06/2017 CLINICAL DATA:  Left internal jugular catheter placement. EXAM: PORTABLE CHEST 1 VIEW COMPARISON:  Earlier today. FINDINGS: Interval left jugular catheter with its tip in the proximal superior vena cava. No pneumothorax. Endotracheal tube in satisfactory position. Poor inspiration. Stable enlarged cardiac silhouette. Minimal atelectasis at both lung bases. Thoracic spine degenerative changes. IMPRESSION: 1. Left jugular catheter tip in the proximal superior vena cava. 2. Stable cardiomegaly, poor inspiration and minimal bibasilar atelectasis. Electronically Signed   By: Claudie Revering M.D.   On: 06/11/2017 19:09   Dg Chest Port 1 View  Result Date: 06/18/2017 CLINICAL DATA:  Status post intubation. EXAM: PORTABLE CHEST 1 VIEW COMPARISON:  01/20/2017. FINDINGS: ETT 3.6 cm above carina. Cardiomegaly. Low lung volumes. No consolidation or edema. Calcified tortuous aorta. No osseous findings. IMPRESSION: ET tube in satisfactory position. Low lung volumes. Cardiomegaly without definite consolidation or edema. Electronically Signed   By: Staci Righter M.D.   On: 06/18/2017 15:04    ASSESSMENT AND PLAN:  A/P: 35M UGIB,2/2 esophageal varices.  * UGIB/symptomatic acute blood loss anemia Secondary to esophageal varices Presented withw/ syncope, hypotension, hemodynamic instability Status post EGD by gastroenterology on June 04, 2017 - noted esophageal variceal bleeding/required transfusion during procedure/status post  banding, per gastroenterology-recommended transfer to [facility] tertiary facility for TIPS, but patient is not a candidate for TIPS procedure, continue ICU care,Octreotide/Protonix drips, ceftriaxone for prevention of SBP  *Acute hypoxic respiratory failure Continue mechanical ventilation with weaning as tolerated  *Acute cardiovascular collapse Exacerbated by above Continue vasopressor support with weaning as tolerated  * Acute ESRD Nephrology input appreciated  Receiving CRRT  * Hyperglycemiawith history of IDDM Stable on current regiment  * NAFLD/Cirrhosis Stable ContinueLactulose,Rifaximin,and Aldactone  * HTN Stable  * HLD/CAD/MI Stable   All the records are reviewed and case discussed with Care Management/Social Workerr. Management plans discussed with the patient, family and they are in agreement.  CODE STATUS: DNR  TOTAL TIME TAKING CARE OF THIS PATIENT: 35 minutes.     POSSIBLE D/C IN 2-5 DAYS, DEPENDING ON CLINICAL CONDITION.   Avel Peace Salary M.D on 06/06/2017   Between 7am to 6pm - Pager - [phone]  After 6pm go to [email or web address] - password [email or web address] [facility]  Office  [phone]  CC: Primary care  physician; Einar Pheasant, MD  Note: This dictation was prepared with Dragon dictation along with smaller phrase technology. Any transcriptional errors that result from this process are unintentional.

## 2017-06-06 NOTE — Progress Notes (Signed)
PT temp 96.3. Bair hugger applied.

## 2017-06-06 NOTE — Progress Notes (Addendum)
0800: Patient extremely agitated and trying to pull at tube and pull himself out of bed. 2mg  of versed administered.  0920: Fentanyl turned off for SBT. Patient unable to follow commands. Pt intermittently agitated. Per Dr. Mortimer Fries, we are waiting till patient becomes more alert to extubate him. PT hgb this morning was 7 and 6.9 on recheck. 1 Unit of blood ordered.   12:30: Pt HR in the 170's sustained. Patient attempting to get out of bed. MD paged. Pt extubated to 6L [place].   12:45: Patient not as responsive as this morning. PT HR has been in the 140-170's intermittently. However pt was minimally responsive. PT RR of 6 and using accessory muscles. MD at beside.   1330: Flumazenil and Narcan administered. Effective almost immediately. Patient extremely agitated. Unable to follow commands or orient. However patient was able to say "I'm mad" and "pray". Additionally, pt had a very large dark BM.   1400: Pt started on precedex for agitation. PT RR of 18 and O2 of 94.

## 2017-06-06 NOTE — Progress Notes (Signed)
   06/06/17 1105  Clinical Encounter Type  Visited With Family  Visit Type Follow-up   Chaplain attempted follow up.  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  Chaplain spoke of ongoing chaplain availability if needs changed.

## 2017-06-06 NOTE — Progress Notes (Signed)
PULMONARY / CRITICAL CARE MEDICINE   Name: Zachary Buck MRN: [id number] DOB: 08/04/1940    ADMISSION DATE:  06/05/2017   CONSULTATION DATE:  06/04/2014  REFERRING MD:  Dr. Jodell Cipro  REASON: Acute GI and bleed and near-syncope  HPI Remains intubated Wean off sedation Plan for SAT/SBT today Wean off pressors as tolerated S/P EGD BANDING-10 BANDS SEVERE ESOPH VARICES SEVERE SHOCK RENAL FAILURE STARTED CRRT GIVEN PRBCS REMAINS CRITICALLY ILL ON VASOPRESSORS    VITAL SIGNS: BP (!) 123/54   Pulse (!) 106   Temp (!) 96.3 F (35.7 C) Comment: bair hugger applied   Resp 18   Ht _0  (1.753 m)   Wt 196 lb 6.9 oz (89.1 kg)   SpO2 100%   BMI 29.01 kg/m   HEMODYNAMICS: CVP:  [10 mmHg-17 mmHg] 15 mmHg  VENTILATOR SETTINGS: Vent Mode: PRVC FiO2 (%):  [30 %] 30 % Set Rate:  [15 bmp] 15 bmp Vt Set:  [500 mL] 500 mL PEEP:  [5 cmH20] 5 cmH20 Plateau Pressure:  [14 cmH20-17 cmH20] 17 cmH20  INTAKE / OUTPUT: I/O last 3 completed shifts: In: 13216.5 [I.V.:12866.7; IV Piggyback:349.8] Out: 160 [Urine:160]   PHYSICAL EXAMINATION: General: ill appearing Neuro: Alert and oriented x4, cranial nerves intact HEENT: PERRLA, no JVD Cardiovascular: Apical pulse regular, S1-S2, no murmur regurg or gallop, +2 pulses, no edema Lungs: Bilateral breath sounds without wheezes or rhonchi Abdomen: Nondistended, hypoactive bowel sounds in all 4 quadrants, palpation reveals epigastric tenderness and right upper quadrant tenderness, no referred rebound tenderness Musculoskeletal: Positive range of motion in upper and lower extremities, no joint deformities Skin: Pale, warm and dry LABS:  BMET Recent Labs  Lab 06/05/17 2157 06/06/17 0220 06/06/17 0607  NA 137 138 136  K 4.5 4.5 4.4  CL 107 107 106  CO2 20* 21* 22  BUN 27* 27* 29*  CREATININE 2.14* 2.18* 2.29*  GLUCOSE 109* 134* 154*    Electrolytes Recent Labs  Lab 05/31/2017 0734 06/15/2017 1227  06/05/17 2157  06/06/17 0220 06/06/17 0607  CALCIUM 8.4* 8.4*   < > 7.1* 7.2* 7.1*  MG 1.2* 1.7  --   --   --   --   PHOS 3.9  --    < > 6.2* 5.9* 5.7*   < > = values in this interval not displayed.    CBC Recent Labs  Lab 06/15/2017 0226 06/16/2017 0734  06/08/2017 0707 06/05/2017 1346 06/05/17 0937  WBC 14.9* 10.5  --   --   --  33.7*  HGB 6.2* 7.8*   < > 7.1* 8.3* 9.1*  HCT 18.6* 22.6*  --   --   --  27.5*  PLT 163 115*  --   --   --  93*   < > = values in this interval not displayed.    Coag's Recent Labs  Lab 06/05/2017 2244 06/19/2017 0734  APTT 29  --   INR 1.30 1.37    Sepsis Markers No results for input(s): LATICACIDVEN, PROCALCITON, O2SATVEN in the last 168 hours.  ABG Recent Labs  Lab 05/31/2017 1422 06/05/17 0743  PHART 7.15* 7.22*  PCO2ART 37 45  PO2ART 127* 110*    Liver Enzymes Recent Labs  Lab 05/29/2017 2244 06/05/2017 0734  05/23/2017 1714  06/05/17 2157 06/06/17 0220 06/06/17 0607  AST 25 25  --  41  --   --   --   --   ALT 18 16*  --  29  --   --   --   --  ALKPHOS 59 51  --  47  --   --   --   --   BILITOT 0.7 0.8  --  0.8  --   --   --   --   ALBUMIN 2.3* 2.2*   < > 1.9*   < > 1.5* 1.5* 1.5*   < > = values in this interval not displayed.    Cardiac Enzymes No results for input(s): TROPONINI, PROBNP in the last 168 hours.  Glucose Recent Labs  Lab 06/05/17 1514 06/05/17 1553 06/05/17 2039 06/05/17 2327 06/06/17 0411 06/06/17 0731  GLUCAP 49* 79 81 101* 131* 154*    Imaging No results found.   STUDIES:  Due for EGD today  CULTURES: None  ANTIBIOTICS: Ceftriaxone  SIGNIFICANT EVENTS: 4/13: Admitted with GI bleed  LINES/TUBES: Peripheral IV Left IJ 4/15 RT FEMORAL VASC CATH 4/15  DISCUSSION: 77 year old with a history of esophageal varices, liver cirrhosis and hepatic encephalopathy presenting with a recurrent GI bleed secondary to esophageal varices, near syncope and severe hypovolemic shock. GI notified of drop in hemoglobin and  dark stools, severe anemia  COMPLICATED BY SEVERE ACIDOSIS AND PROGRESSIVE RENAL FAILURE AND SEVERE RESP FAILURE  ASSESSMENT   1.Respiratory Failure -continue Full MV support -continue Bronchodilator Therapy -Wean Fio2 and PEEP as tolerated -will perform SAT/SBt when respiratory parameters are met  2.Acute upper GI bleed-ESOPH VARICEAL S/P BANDING PPI INFUSION OCTREOTIDE AND ABX [facility] SAYS NOT A CANDIDATE FOR TIPS FAMILY DECIDED NOT TO PURSUE TIPS AND DID NOT WANT TO TRANSFER  3.MASSIVE GIB AND BLOOD LOSS COMPLICATED BY ACUTE REANL FAILURE AND SEVERE ACIDOSIS  4.Acute blood loss anemia-FOLLOW CBC AND TRANSFUSE AS NEEDED Seems that GIB has stabilized  5.Cryptogenic liver cirrhosis -POOR PROGNOSIS  6.RENAL FAILURE FROM ATN On CRRT  Critical Care Time devoted to patient care services described in this note is 32 minutes.  Overall, patient is critically ill, prognosis is guarded.  high risk for cardiac arrest and death.   AT THIS TIME, WE ARE WAITING TO SEE IF PATIENTS BLEEDING STABILIZES WHILE SUPPORTING BLOOD PRESSURE AND TRANSFUSING AS NEEDED AND PROVIDING CRRT FOR SEVERE ACIDOSIS AND HYPERKALEMIA,   WILL PLAN FOR SAT/SBT TODAY [facility] At [facility] of plan  Daleyza Gadomski Patricia Pesa, M.D.  [facility]  Medical Director [place] Director [facility] Cardio-Pulmonary Department

## 2017-06-06 NOTE — Progress Notes (Signed)
Patient is extubated to 6 lpm O2 [place]

## 2017-06-06 NOTE — Progress Notes (Signed)
Pt on NRB SPO2 89-91%. NTS'd pt for moderate amount of tan and dark red secretions. Pt tol well. BBS remain very rhonchorous. Breathing treatment given during which SPO2 remained in low to mid 80's. HR 108, RR 13.

## 2017-06-06 NOTE — Progress Notes (Signed)
Pt became agitated and started pulling on tubes and lines at change of shift. Fentanyl restarted at 100 mcgs and versed 2 mg given with good results. CRRT ran overnight, BUN and Cr stable, Potassium now wnl. Family updated.

## 2017-06-07 LAB — TYPE AND SCREEN
ABO/RH(D): A POS
Antibody Screen: NEGATIVE
Unit division: 0

## 2017-06-07 LAB — BPAM RBC
BLOOD PRODUCT EXPIRATION DATE: 201905182359
ISSUE DATE / TIME: 201904171432
Unit Type and Rh: 6200

## 2017-06-07 MED ORDER — GLYCOPYRROLATE 0.2 MG/ML IJ SOLN
0.2000 mg | INTRAMUSCULAR | Status: DC | PRN
  Administered 2017-06-07: 0.2 mg via INTRAVENOUS
  Filled 2017-06-07: qty 1

## 2017-06-07 MED ORDER — MORPHINE BOLUS VIA INFUSION
2.0000 mg | INTRAVENOUS | Status: DC | PRN
  Filled 2017-06-07: qty 2

## 2017-06-07 MED ORDER — MORPHINE 100MG IN NS 100ML (1MG/ML) PREMIX INFUSION
1.0000 mg/h | INTRAVENOUS | Status: DC
  Administered 2017-06-07: 3 mg/h via INTRAVENOUS
  Filled 2017-06-07: qty 100

## 2017-06-07 MED ORDER — LORAZEPAM 2 MG/ML IJ SOLN
1.0000 mg | INTRAMUSCULAR | Status: DC | PRN
  Filled 2017-06-07: qty 1

## 2017-06-11 ENCOUNTER — Telehealth: Payer: Self-pay | Admitting: Internal Medicine

## 2017-06-11 NOTE — Telephone Encounter (Signed)
[employer] dropped off Death Certificate to be signed °Placed in Nurse Box °

## 2017-06-11 NOTE — Telephone Encounter (Signed)
Death cert placed on DS' desk to be filled out.

## 2017-06-12 NOTE — Telephone Encounter (Signed)
[place] informed death cert is ready for pick up.

## 2017-06-20 NOTE — Progress Notes (Signed)
Pt went asystole at 534 am. Family at bedside. Pronounced by Laurin Coder, RN and Craige Cotta, RN

## 2017-06-20 NOTE — Progress Notes (Signed)
   06/12/2017 0232  Clinical Encounter Type  Visited With Patient and family together  Visit Type Follow-up  Referral From Nurse  Consult/Referral To [place] responded to page regarding patient decline and family support.  Chaplain maintained pastoral presence and offered silent prayers at doorway of room.  Sons and spouse at bedside, tearful, saying goodbye.  One son came out of room and told chaplain that patient was saved, that he was a Theme park manager, and asked chaplain to go.  Chaplain exited.

## 2017-06-20 NOTE — Progress Notes (Signed)
Pts respiratory status declined throughout the night with worsening hypoxia and tachypnea with increasing secretions despite aggressive treatment. Spoke with pts wife and 2 sons who were at pts bedside regarding decline in pts condition.  Pts family decided to transition pt to comfort measures only.  Discussed decline in pts condition and change in plan of care with ICU Intensivist Dr. Mortimer Fries via telephone he is in agreement with change in plan of care.  [name], [place] Pager [phone] (please enter 7 digits) PCCM Consult Pager [phone] (please enter 7 digits)

## 2017-06-20 NOTE — Progress Notes (Signed)
Zachary Antigua, MD [address] [place], [place], [place], [address] [address], [place], [place], [place], [address] Phone: [phone]  Fax: [phone]   Subjective: Pt. Extubated. On NRB and resting. No episodes of emesis. Nurse reports 1 dark BM today.    Objective: Exam: Vital signs in last 24 hours: Vitals:   06/06/17 1900 06/06/17 2000 06/06/17 2100 06/06/17 2105  BP: (!) 93/38 (!) 103/51    Pulse: (!) 102 (!) 103 (!) 107   Resp: 19 (!) 26 15   Temp:  (!) 95.2 F (35.1 C) (!) 96.4 F (35.8 C)   TempSrc:      SpO2: 90% 93% 90% 90%  Weight:      Height:       Weight change:   Intake/Output Summary (Last 24 hours) at 06/24/17 0030 Last data filed at 06/06/2017 1900 Gross per 24 hour  Intake 4610.37 ml  Output 614 ml  Net 3996.37 ml    General: No acute distress Abd: Soft, NT/ND, No HSM Skin: Warm, no rashes Neck: Supple, Trachea midline   Lab Results: Lab Results  Component Value Date   WBC 33.7 (H) 06/05/2017   HGB 8.2 (L) 06/06/2017   HCT 25.5 (L) 06/06/2017   MCV 87.6 06/05/2017   PLT 93 (L) 06/05/2017   Micro Results: Recent Results (from the past 240 hour(s))  MRSA PCR Screening     Status: None   Collection Time: 05/23/2017  3:01 AM  Result Value Ref Range Status   MRSA by PCR NEGATIVE NEGATIVE Final    Comment:        The GeneXpert MRSA Assay (FDA approved for NASAL specimens only), is one component of a comprehensive MRSA colonization surveillance program. It is not intended to diagnose MRSA infection nor to guide or monitor treatment for MRSA infections. Performed at [facility], [address], [place], [place] [address]    Studies/Results: No results found. Medications:  Scheduled Meds: . budesonide (PULMICORT) nebulizer solution  0.25 mg Nebulization BID  . chlorhexidine gluconate (MEDLINE KIT)  15 mL Mouth Rinse BID  . hydrocortisone sod succinate (SOLU-CORTEF) inj  50 mg Intravenous Q6H  . insulin  aspart  0-15 Units Subcutaneous Q4H  . ipratropium-albuterol  3 mL Nebulization Q4H  . mouth rinse  15 mL Mouth Rinse 10 times per day  . pantoprazole (PROTONIX) IV  40 mg Intravenous Q12H  . rifaximin  550 mg Oral BID   Continuous Infusions: . cefTRIAXone (ROCEPHIN)  IV Stopped (06/06/17 1833)  . dexmedetomidine (PRECEDEX) IV infusion Stopped (06/06/17 1645)  . norepinephrine (LEVOPHED) Adult infusion 24.96 mcg/min (06/06/17 1700)  . octreotide  (SANDOSTATIN)    IV infusion 50 mcg/hr (06/06/17 1700)  . phenylephrine (NEO-SYNEPHRINE) Adult infusion Stopped (06/06/17 1745)  . pureflow 3 each (06/06/17 1747)  .  sodium bicarbonate  infusion 1000 mL 100 mL/hr at 06/06/17 2352  . vasopressin (PITRESSIN) infusion - *FOR SHOCK* 0.03 Units/min (06/06/17 1700)   PRN Meds:.acetaminophen, bisacodyl, heparin, naLOXone (NARCAN)  injection, ondansetron **OR** ondansetron (ZOFRAN) IV, senna-docusate   Assessment: Active Problems:   Encounter for central line placement   Acute upper GI bleeding   Hematemesis with nausea   Secondary esophageal varices with bleeding ([place])    Plan: Required PRBC transfusion today But no emesis Clinically it appears GI Bleed may have stabilized Dark BM likely old blood  Remains guarded, but extubation is a good sign Continue Serial CBCs and transfuse PRN PPI IV BID Octreotide drip  Spent time discussing varices and GI bleed from them with family in detail and they are understanding of the process and treatment options.   Continue treatment for renal failure as per Nephrology and ICU team Continue rifaximin   LOS: 4 days   Zachary Antigua, MD Jun 22, 2017, 12:30 AM

## 2017-06-20 NOTE — Death Summary Note (Signed)
DEATH SUMMARY   Patient Details  Name: Zachary Buck MRN: [id number] DOB: 01/20/1941  Admission/Discharge Information   Admit Date:  24-Jun-2017  Date of Death:  29-Jun-2017  Time of Death:  0534  Length of Stay: 4  Referring Physician: Einar Pheasant, MD   Reason(s) for Hospitalization  Upper GI Bleeding  Diagnoses  Preliminary cause of death: Acute respiratory failure ([place]) Secondary Diagnoses (including complications and co-morbidities):  Active Problems:   Encounter for central line placement   Acute upper GI bleeding   Hematemesis with nausea   Secondary esophageal varices with bleeding ([place])   Cryptogenic Liver Cirrhosis    Multiple Myeloma    Nonalcoholic fatty liver disease    Hepatitis A    Acute on chronic renal failure    Hyperkalemia    Hypovolemic shock secondary to GI Bleed with hypotension    Hepatic Encephalopathy    Type II Diabetes Mellitus   Brief Hospital Course (including significant findings, care, treatment, and services provided and events leading to death)  Zachary Buck is a 77 y.o. year old male who presented to the ED on 06-25-22 with hematemesis and hypotension.  He was subsequently admitted to the telemetry unit for further workup and treatment.  On 04/14 pt transferred to ICU following near syncopal episode with acute blood loss anemia due to pt vomiting 700 ml bright red blood. However, due to hyperkalemia on 04/14 gastroenterologist unable to proceed with endoscopy until levels corrected.  On 04/15, pt had multiple episodes of melena and developed hypovolemic shock with hypotension requiring multiple pressors.  Therefore, pt mechanically intubated on 04/15 for upper endoscopy.  EGD revealed bleeding grade III esophageal varices. Incompletely eradicated. Banded. Subsequently treated with hemospray with stopping of active bleeding as evidenced by no further bright red blood seen previously upon re-examination after hemospray.  Gastroenterology recommended pt transfer to another facility for evaluation for emergent TIPS procedure, [facility] and [facility] contacted by ICU Intensivist for pt transfer. However, [facility] did not accept pt for TIPS procedure determining risk outweighed benefit and determined pt not a candidate for IR procedures due to contrast dye load.  Therefore, on 04/15 ICU intensivist spoke with pts family regarding grave prognosis code status changed to DO NOT RESUSCITATE per family request. Pts family also declined transfer to [facility].  Due to worsening renal failure, hyperkalemia, and oliguria continuous renal replacement therapy initiated on 04/15 following pts family consent.  On 04/17 pt weaned and extubated.  However, on Jun 30, 2022 pts respiratory status declined significantly despite aggressive treatment pt transitioned to comfort measures only per family request.  Pt expired on 2017/06/29 at 0534 am with family members at bedside.  Pertinent Labs and Studies  Significant Diagnostic Studies Dg Chest Port 1 View  Result Date: 05/23/2017 CLINICAL DATA:  Central line placement EXAM: PORTABLE CHEST 1 VIEW COMPARISON:  05/30/2017, 01/20/2017 FINDINGS: Endotracheal tube tip is about 3.9 cm superior to the carina. Left-sided central venous catheter tip superimposes the SVC origin. Right-sided central venous catheter tip overlies the right atrium. No right pneumothorax is seen. Cardiomegaly. Tiny left pleural effusion. Increasing atelectasis or infiltrate at the left base. Aortic atherosclerosis. Air-filled dilated bowel in the upper abdomen. IMPRESSION: 1. New right-sided central venous catheter with tip superimposing the right atrium. Negative for a pneumothorax 2. Remaining support lines and tubes as above 3. Cardiomegaly with tiny left pleural effusion. Increasing atelectasis or infiltrate at the left base. Electronically Signed   By: Madie Reno.D.  On: 05/26/2017 21:31   Dg Chest Port 1 View  Result Date:  05/26/2017 CLINICAL DATA:  Left internal jugular catheter placement. EXAM: PORTABLE CHEST 1 VIEW COMPARISON:  Earlier today. FINDINGS: Interval left jugular catheter with its tip in the proximal superior vena cava. No pneumothorax. Endotracheal tube in satisfactory position. Poor inspiration. Stable enlarged cardiac silhouette. Minimal atelectasis at both lung bases. Thoracic spine degenerative changes. IMPRESSION: 1. Left jugular catheter tip in the proximal superior vena cava. 2. Stable cardiomegaly, poor inspiration and minimal bibasilar atelectasis. Electronically Signed   By: Claudie Revering M.D.   On: 06/08/2017 19:09   Dg Chest Port 1 View  Result Date: 06/08/2017 CLINICAL DATA:  Status post intubation. EXAM: PORTABLE CHEST 1 VIEW COMPARISON:  01/20/2017. FINDINGS: ETT 3.6 cm above carina. Cardiomegaly. Low lung volumes. No consolidation or edema. Calcified tortuous aorta. No osseous findings. IMPRESSION: ET tube in satisfactory position. Low lung volumes. Cardiomegaly without definite consolidation or edema. Electronically Signed   By: Staci Righter M.D.   On: 05/30/2017 15:04    Microbiology Recent Results (from the past 240 hour(s))  MRSA PCR Screening     Status: None   Collection Time: 06/11/2017  3:01 AM  Result Value Ref Range Status   MRSA by PCR NEGATIVE NEGATIVE Final    Comment:        The GeneXpert MRSA Assay (FDA approved for NASAL specimens only), is one component of a comprehensive MRSA colonization surveillance program. It is not intended to diagnose MRSA infection nor to guide or monitor treatment for MRSA infections. Performed at [facility], [address] [place], [place] [address]     Lab Basic Metabolic Panel: Recent Labs  Lab 06/15/2017 [phone] 05/25/2017 1227  06/06/17 0607 06/06/17 0956 06/06/17 1422 06/06/17 1731 06/06/17 2204  NA 137 138   < > 136 136 136 136 136  K 5.9* 5.9*   < > 4.4 4.0 3.6 3.9 3.6  CL 117* 115*   < > 106 103 107 106 103   CO2 16* 17*   < > 22 25 21* 24 26  GLUCOSE 267* 273*   < > 154* 259* 139* 163* 182*  BUN 43* 46*   < > 29* 28* 30* 30* 31*  CREATININE 1.64* 1.79*   < > 2.29* 2.11* 2.11* 2.06* 2.19*  CALCIUM 8.4* 8.4*   < > 7.1* 6.9* 6.8* 6.9* 7.1*  MG 1.2* 1.7  --   --   --   --   --   --   PHOS 3.9  --    < > 5.7* 5.6* 5.1* 5.1* 5.4*   < > = values in this interval not displayed.   Liver Function Tests: Recent Labs  Lab 05/24/2017 2244 06/12/2017 0734  06/10/2017 1714  06/06/17 0607 06/06/17 0956 06/06/17 1422 06/06/17 1731 06/06/17 2204  AST 25 25  --  41  --   --   --   --   --   --   ALT 18 16*  --  29  --   --   --   --   --   --   ALKPHOS 59 51  --  47  --   --   --   --   --   --   BILITOT 0.7 0.8  --  0.8  --   --   --   --   --   --   PROT 5.9* 5.5*  --  4.4*  --   --   --   --   --   --  ALBUMIN 2.3* 2.2*   < > 1.9*   < > 1.5* 1.5* 1.5* 1.7* 1.7*   < > = values in this interval not displayed.   Recent Labs  Lab 06/11/2017 2244  LIPASE 46   Recent Labs  Lab 05/26/2017 1903 06/05/17 0410 06/05/17 1527 06/06/17 0800  AMMONIA 31 65* 70* 67*   CBC: Recent Labs  Lab 05/22/2017 2244 06/18/2017 0226 05/27/2017 0734  06/05/17 0937 06/06/17 0924 06/06/17 0956 06/06/17 1655 06/06/17 2137  WBC 11.1* 14.9* 10.5  --  33.7*  --   --   --   --   NEUTROABS  --   --   --   --  28.7*  --   --   --   --   HGB 8.0* 6.2* 7.8*   < > 9.1* 7.0* 6.9* 8.3* 8.2*  HCT 23.4* 18.6* 22.6*  --  27.5* 21.6* 21.1* 25.5*  --   MCV 87.9 89.1 85.3  --  87.6  --   --   --   --   PLT 146* 163 115*  --  93*  --   --   --   --    < > = values in this interval not displayed.   Cardiac Enzymes: No results for input(s): CKTOTAL, CKMB, CKMBINDEX, TROPONINI in the last 168 hours. Sepsis Labs: Recent Labs  Lab 06/17/2017 2244 05/31/2017 0226 06/16/2017 0734 06/05/17 0937  WBC 11.1* 14.9* 10.5 33.7*    Procedures/Operations  Mechanical Intubation  Upper Endoscopy Central Line Placement Temporary Dialysis Catheter  Placement Continuous Renal Replacement Therapy   [name], [place] Pager [phone] (please enter 7 digits) PCCM Consult Pager [phone] (please enter 7 digits)

## 2017-06-20 DEATH — deceased

## 2017-06-21 ENCOUNTER — Ambulatory Visit: Payer: PPO | Admitting: Internal Medicine

## 2017-07-04 ENCOUNTER — Ambulatory Visit (INDEPENDENT_AMBULATORY_CARE_PROVIDER_SITE_OTHER): Payer: PPO | Admitting: Ophthalmology

## 2017-07-05 ENCOUNTER — Ambulatory Visit: Payer: PPO | Admitting: Neurology

## 2017-07-11 ENCOUNTER — Other Ambulatory Visit: Payer: PPO

## 2017-07-12 ENCOUNTER — Ambulatory Visit: Payer: PPO

## 2017-08-24 ENCOUNTER — Other Ambulatory Visit: Payer: PPO

## 2017-08-27 ENCOUNTER — Ambulatory Visit: Payer: PPO | Admitting: Hematology and Oncology

## 2017-08-27 ENCOUNTER — Ambulatory Visit: Payer: PPO

## 2019-04-10 IMAGING — CR DG BONE SURVEY MET
1 series · 8 of 8 positions shown · non-contrast
Comparison: CT 01/20/2017.  Bone survey 09/16/2015 09/16/2015.

CLINICAL DATA: Multiple myeloma.

EXAM:
METASTATIC BONE SURVEY

[Series 1: dg bone survey met · 0.14mm/px · 8 of 22 slices shown]
[im 1/22]
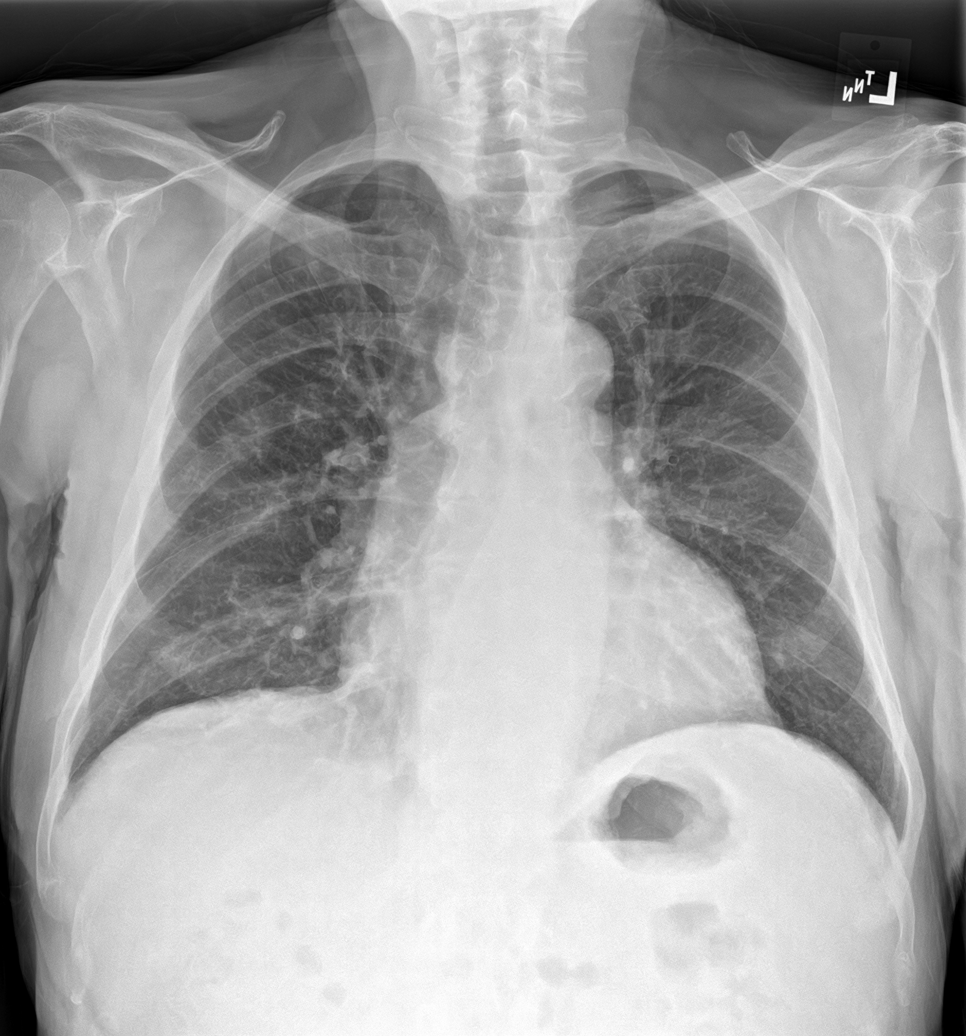
[im 4/22]
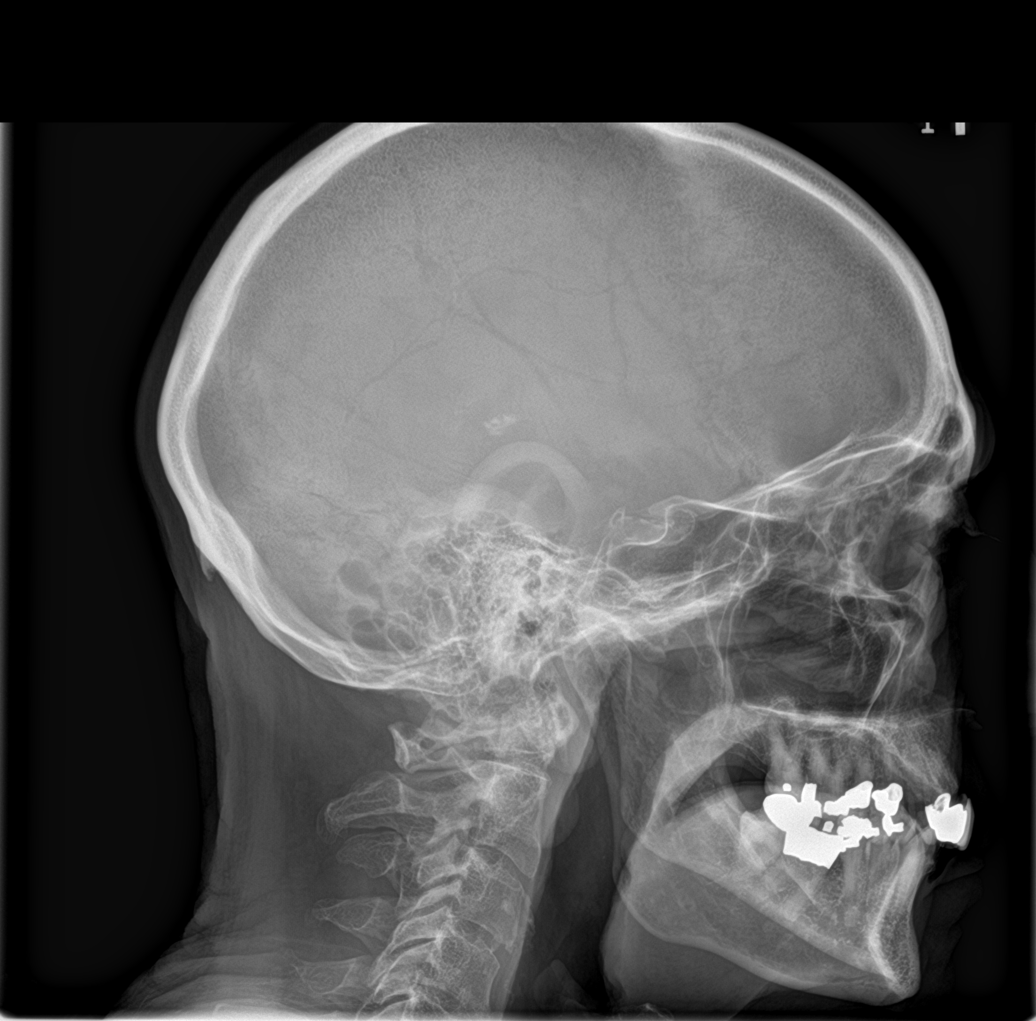
[im 7/22]
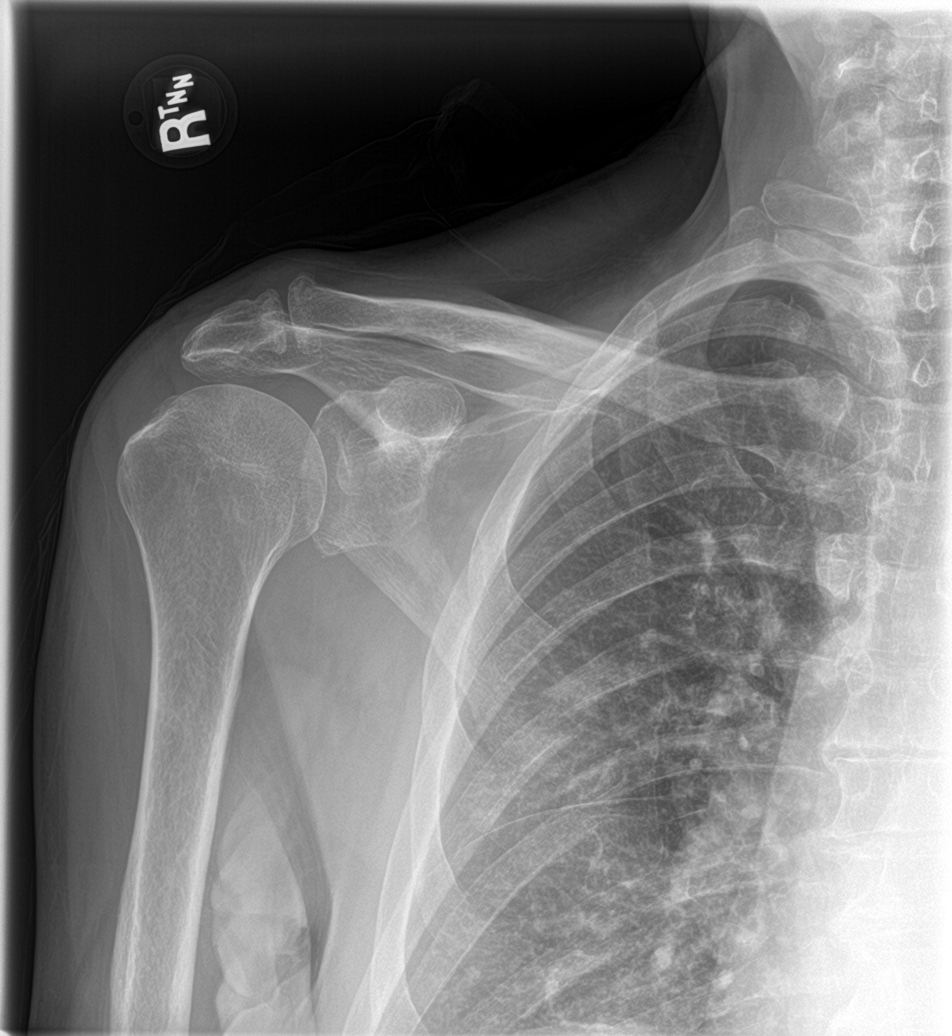
[im 10/22]
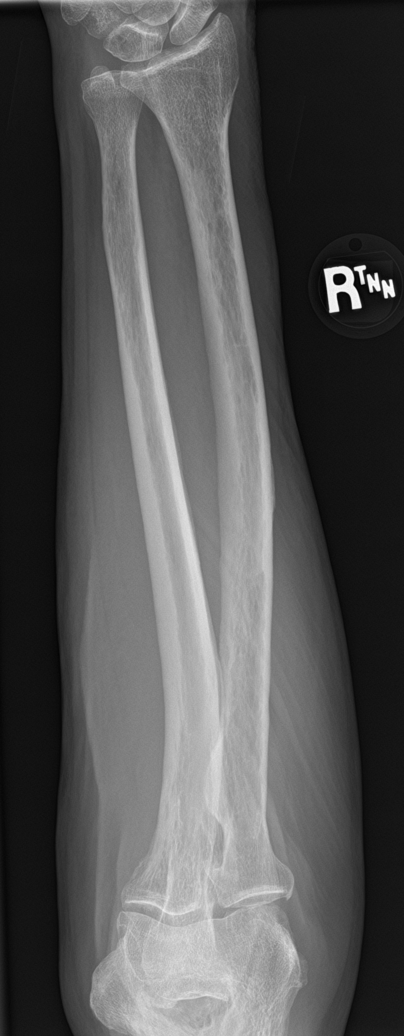
[im 13/22]
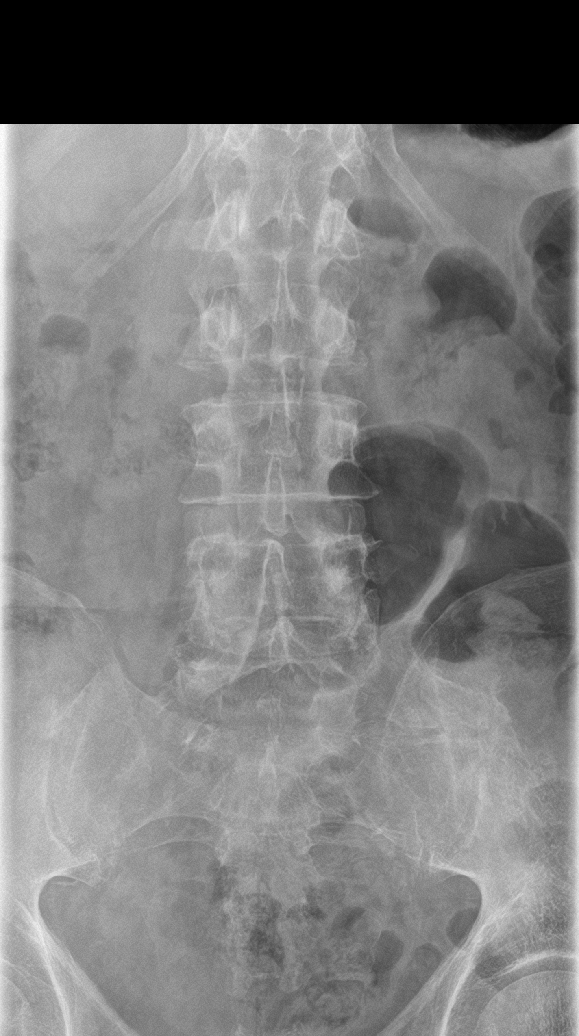
[im 16/22]
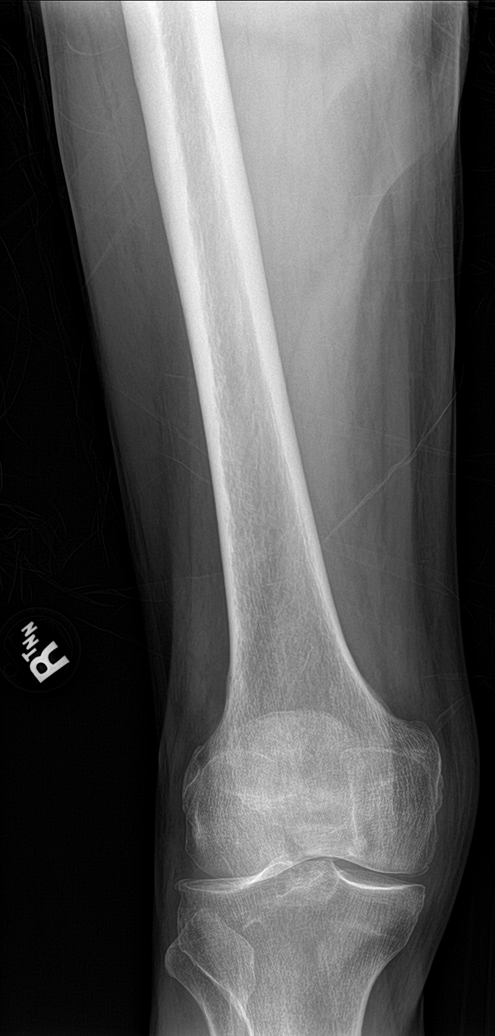
[im 19/22]
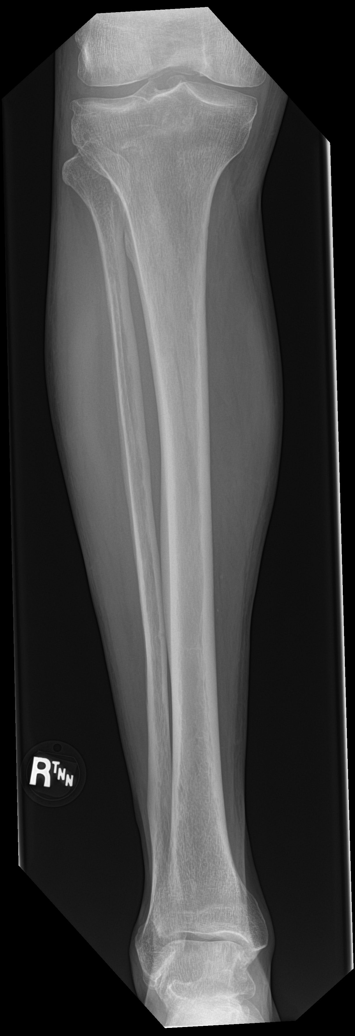
[im 22/22]
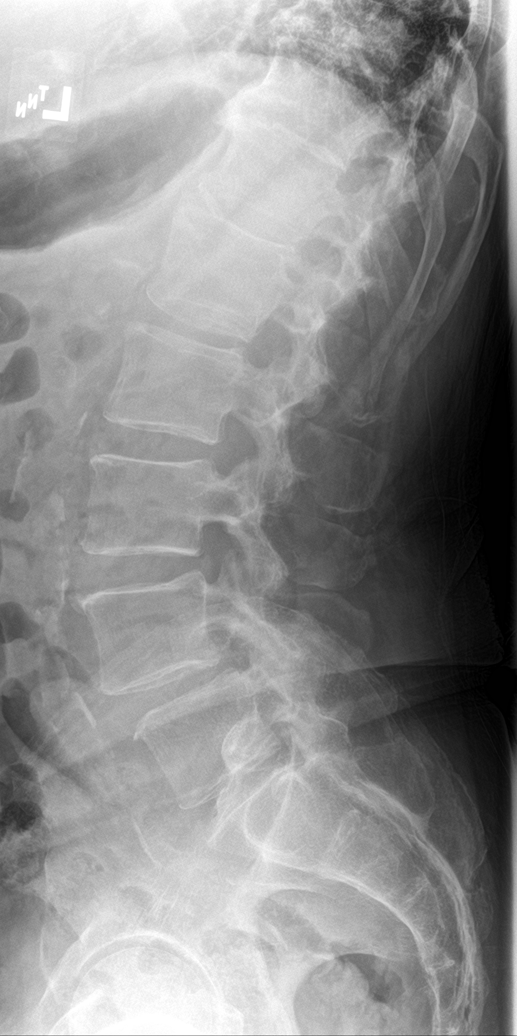

[8 of 8 positions shown; findings below may reference images not displayed]

FINDINGS: No lytic or blastic abnormalities identified. No acute bony
abnormality. Old left tibial fracture. Diffuse osteopenia and
multifocal degenerative change. Stable mild anterolisthesis L4 on
L5. Previously identified right rib fractures best identified by
prior CT. No acute cardiopulmonary disease identified. No
pneumothorax. Stable cardiomegaly. Aortoiliac atherosclerotic
vascular calcification. Carotid vascular calcification.
IMPRESSION: 1. No lytic or blastic abnormalities identified. Exam stable from
prior bone survey..

2. Previous identified right rib fractures best identified by prior
CT.

3.  Carotid vascular disease.
